# Patient Record
Sex: Male | Born: 1965 | Race: White | Hispanic: No | Marital: Married | Smoking: Current some day smoker
Health system: Southern US, Community
[De-identification: ages and names within clinical notes are randomized; demographics above are authoritative.]

## PROBLEM LIST (undated history)

## (undated) DIAGNOSIS — I10 Essential (primary) hypertension: Secondary | ICD-10-CM

## (undated) DIAGNOSIS — I5030 Unspecified diastolic (congestive) heart failure: Secondary | ICD-10-CM

## (undated) DIAGNOSIS — I429 Cardiomyopathy, unspecified: Secondary | ICD-10-CM

## (undated) DIAGNOSIS — N182 Chronic kidney disease, stage 2 (mild): Secondary | ICD-10-CM

## (undated) DIAGNOSIS — K529 Noninfective gastroenteritis and colitis, unspecified: Secondary | ICD-10-CM

## (undated) DIAGNOSIS — F32A Depression, unspecified: Secondary | ICD-10-CM

## (undated) DIAGNOSIS — I219 Acute myocardial infarction, unspecified: Secondary | ICD-10-CM

## (undated) DIAGNOSIS — F17200 Nicotine dependence, unspecified, uncomplicated: Secondary | ICD-10-CM

## (undated) DIAGNOSIS — I513 Intracardiac thrombosis, not elsewhere classified: Secondary | ICD-10-CM

## (undated) DIAGNOSIS — I639 Cerebral infarction, unspecified: Secondary | ICD-10-CM

## (undated) DIAGNOSIS — F329 Major depressive disorder, single episode, unspecified: Secondary | ICD-10-CM

## (undated) DIAGNOSIS — I2510 Atherosclerotic heart disease of native coronary artery without angina pectoris: Secondary | ICD-10-CM

## (undated) DIAGNOSIS — Z8673 Personal history of transient ischemic attack (TIA), and cerebral infarction without residual deficits: Secondary | ICD-10-CM

## (undated) DIAGNOSIS — K8020 Calculus of gallbladder without cholecystitis without obstruction: Secondary | ICD-10-CM

## (undated) DIAGNOSIS — E785 Hyperlipidemia, unspecified: Secondary | ICD-10-CM

## (undated) DIAGNOSIS — W19XXXA Unspecified fall, initial encounter: Secondary | ICD-10-CM

## (undated) DIAGNOSIS — J449 Chronic obstructive pulmonary disease, unspecified: Secondary | ICD-10-CM

## (undated) HISTORY — DX: Atherosclerotic heart disease of native coronary artery without angina pectoris: I25.10

## (undated) HISTORY — PX: CORONARY ARTERY BYPASS GRAFT: SHX141

## (undated) HISTORY — DX: Personal history of transient ischemic attack (TIA), and cerebral infarction without residual deficits: Z86.73

## (undated) HISTORY — DX: Intracardiac thrombosis, not elsewhere classified: I51.3

## (undated) HISTORY — DX: Unspecified diastolic (congestive) heart failure: I50.30

## (undated) HISTORY — DX: Essential (primary) hypertension: I10

## (undated) HISTORY — DX: Chronic kidney disease, stage 2 (mild): N18.2

## (undated) HISTORY — DX: Nicotine dependence, unspecified, uncomplicated: F17.200

## (undated) HISTORY — DX: Hyperlipidemia, unspecified: E78.5

## (undated) HISTORY — PX: EYE SURGERY: SHX253

---

## 2002-12-31 DIAGNOSIS — Z8673 Personal history of transient ischemic attack (TIA), and cerebral infarction without residual deficits: Secondary | ICD-10-CM

## 2002-12-31 HISTORY — DX: Personal history of transient ischemic attack (TIA), and cerebral infarction without residual deficits: Z86.73

## 2010-02-07 ENCOUNTER — Encounter (INDEPENDENT_AMBULATORY_CARE_PROVIDER_SITE_OTHER): Payer: Self-pay | Admitting: *Deleted

## 2010-02-07 LAB — CONVERTED CEMR LAB
ALT: 20 units/L
BUN: 21 mg/dL
Calcium: 9.8 mg/dL
Sodium: 137 meq/L

## 2010-02-15 ENCOUNTER — Emergency Department (HOSPITAL_COMMUNITY): Admission: EM | Admit: 2010-02-15 | Discharge: 2010-02-15 | Payer: Self-pay | Admitting: Emergency Medicine

## 2010-03-01 ENCOUNTER — Encounter (INDEPENDENT_AMBULATORY_CARE_PROVIDER_SITE_OTHER): Payer: Self-pay | Admitting: *Deleted

## 2010-03-01 DIAGNOSIS — I6350 Cerebral infarction due to unspecified occlusion or stenosis of unspecified cerebral artery: Secondary | ICD-10-CM

## 2010-03-02 ENCOUNTER — Ambulatory Visit: Payer: Self-pay | Admitting: Cardiology

## 2010-03-02 DIAGNOSIS — I10 Essential (primary) hypertension: Secondary | ICD-10-CM

## 2010-03-02 DIAGNOSIS — I2510 Atherosclerotic heart disease of native coronary artery without angina pectoris: Secondary | ICD-10-CM

## 2010-03-02 DIAGNOSIS — I25119 Atherosclerotic heart disease of native coronary artery with unspecified angina pectoris: Secondary | ICD-10-CM | POA: Insufficient documentation

## 2010-03-02 DIAGNOSIS — F17200 Nicotine dependence, unspecified, uncomplicated: Secondary | ICD-10-CM | POA: Insufficient documentation

## 2010-03-02 DIAGNOSIS — E782 Mixed hyperlipidemia: Secondary | ICD-10-CM

## 2010-03-02 DIAGNOSIS — Z720 Tobacco use: Secondary | ICD-10-CM | POA: Insufficient documentation

## 2010-03-06 ENCOUNTER — Encounter: Payer: Self-pay | Admitting: Cardiology

## 2010-04-27 ENCOUNTER — Emergency Department (HOSPITAL_COMMUNITY): Admission: EM | Admit: 2010-04-27 | Discharge: 2010-04-27 | Payer: Self-pay | Admitting: Emergency Medicine

## 2010-05-30 ENCOUNTER — Encounter: Payer: Self-pay | Admitting: Cardiology

## 2010-05-31 ENCOUNTER — Encounter (INDEPENDENT_AMBULATORY_CARE_PROVIDER_SITE_OTHER): Payer: Self-pay

## 2010-05-31 ENCOUNTER — Encounter: Payer: Self-pay | Admitting: Cardiology

## 2010-05-31 LAB — CONVERTED CEMR LAB
Alkaline Phosphatase: 87 units/L (ref 39–117)
Bilirubin, Direct: 0.1 mg/dL (ref 0.0–0.3)
Cholesterol: 174 mg/dL (ref 0–200)
Indirect Bilirubin: 0.2 mg/dL (ref 0.0–0.9)
Total Bilirubin: 0.3 mg/dL (ref 0.3–1.2)
Total CHOL/HDL Ratio: 4.5
Total Protein: 6.5 g/dL (ref 6.0–8.3)
Triglycerides: 190 mg/dL — ABNORMAL HIGH (ref <150)

## 2010-06-07 ENCOUNTER — Ambulatory Visit: Payer: Self-pay | Admitting: Cardiology

## 2010-06-07 DIAGNOSIS — I2589 Other forms of chronic ischemic heart disease: Secondary | ICD-10-CM

## 2010-06-08 ENCOUNTER — Ambulatory Visit (HOSPITAL_COMMUNITY): Admission: RE | Admit: 2010-06-08 | Discharge: 2010-06-08 | Payer: Self-pay | Admitting: Cardiology

## 2010-06-08 ENCOUNTER — Encounter: Payer: Self-pay | Admitting: Cardiology

## 2010-06-08 ENCOUNTER — Ambulatory Visit: Payer: Self-pay | Admitting: Cardiology

## 2010-06-09 ENCOUNTER — Encounter: Payer: Self-pay | Admitting: Cardiology

## 2010-06-25 LAB — CONVERTED CEMR LAB
BUN: 12 mg/dL (ref 6–23)
CO2: 25 meq/L (ref 19–32)
Calcium: 9.8 mg/dL (ref 8.4–10.5)
Chloride: 105 meq/L (ref 96–112)
Creatinine, Ser: 1.09 mg/dL (ref 0.40–1.50)
Glucose, Bld: 90 mg/dL (ref 70–99)
Potassium: 5 meq/L (ref 3.5–5.3)

## 2010-06-26 ENCOUNTER — Encounter: Payer: Self-pay | Admitting: Cardiology

## 2010-09-03 ENCOUNTER — Ambulatory Visit: Payer: Self-pay | Admitting: Cardiology

## 2010-09-03 ENCOUNTER — Encounter: Payer: Self-pay | Admitting: Cardiology

## 2010-09-03 ENCOUNTER — Encounter: Payer: Self-pay | Admitting: Emergency Medicine

## 2010-09-04 ENCOUNTER — Observation Stay (HOSPITAL_COMMUNITY): Admission: EM | Admit: 2010-09-04 | Discharge: 2010-09-04 | Payer: Self-pay | Admitting: Emergency Medicine

## 2010-09-05 ENCOUNTER — Encounter: Payer: Self-pay | Admitting: Cardiology

## 2010-09-05 LAB — CONVERTED CEMR LAB
AST: 17 units/L (ref 0–37)
Albumin: 4.2 g/dL (ref 3.5–5.2)
Bilirubin, Direct: 0.1 mg/dL (ref 0.0–0.3)
Indirect Bilirubin: 0.3 mg/dL (ref 0.0–0.9)
LDL Cholesterol: 94 mg/dL (ref 0–99)
Triglycerides: 120 mg/dL (ref <150)

## 2010-09-06 ENCOUNTER — Ambulatory Visit: Payer: Self-pay | Admitting: Cardiology

## 2010-09-06 ENCOUNTER — Encounter (HOSPITAL_COMMUNITY): Admission: RE | Admit: 2010-09-06 | Discharge: 2010-09-06 | Payer: Self-pay | Admitting: Cardiology

## 2010-09-07 ENCOUNTER — Encounter (INDEPENDENT_AMBULATORY_CARE_PROVIDER_SITE_OTHER): Payer: Self-pay | Admitting: *Deleted

## 2010-09-08 ENCOUNTER — Ambulatory Visit: Payer: Self-pay | Admitting: Cardiology

## 2010-09-08 DIAGNOSIS — I513 Intracardiac thrombosis, not elsewhere classified: Secondary | ICD-10-CM | POA: Insufficient documentation

## 2010-09-12 ENCOUNTER — Encounter: Payer: Self-pay | Admitting: Cardiology

## 2010-09-13 ENCOUNTER — Ambulatory Visit: Payer: Self-pay | Admitting: Cardiology

## 2010-09-18 ENCOUNTER — Ambulatory Visit: Payer: Self-pay | Admitting: Cardiology

## 2010-09-18 LAB — CONVERTED CEMR LAB: POC INR: 1.8

## 2010-09-25 ENCOUNTER — Ambulatory Visit: Payer: Self-pay | Admitting: Cardiology

## 2010-09-25 LAB — CONVERTED CEMR LAB: POC INR: 2.4

## 2010-10-02 ENCOUNTER — Encounter: Payer: Self-pay | Admitting: Adult Health

## 2010-10-02 ENCOUNTER — Ambulatory Visit: Payer: Self-pay | Admitting: Cardiology

## 2010-10-02 DIAGNOSIS — R9439 Abnormal result of other cardiovascular function study: Secondary | ICD-10-CM | POA: Insufficient documentation

## 2010-10-09 ENCOUNTER — Ambulatory Visit: Payer: Self-pay | Admitting: Cardiology

## 2010-10-09 LAB — CONVERTED CEMR LAB: POC INR: 1.8

## 2010-10-19 ENCOUNTER — Ambulatory Visit: Payer: Self-pay | Admitting: Cardiology

## 2010-11-02 ENCOUNTER — Ambulatory Visit: Payer: Self-pay | Admitting: Cardiology

## 2010-11-14 ENCOUNTER — Telehealth (INDEPENDENT_AMBULATORY_CARE_PROVIDER_SITE_OTHER): Payer: Self-pay | Admitting: *Deleted

## 2010-11-16 ENCOUNTER — Ambulatory Visit: Payer: Self-pay

## 2010-11-16 LAB — CONVERTED CEMR LAB: POC INR: 2.2

## 2010-11-30 ENCOUNTER — Telehealth (INDEPENDENT_AMBULATORY_CARE_PROVIDER_SITE_OTHER): Payer: Self-pay | Admitting: *Deleted

## 2010-12-07 ENCOUNTER — Ambulatory Visit: Payer: Self-pay | Admitting: Cardiology

## 2010-12-07 LAB — CONVERTED CEMR LAB: POC INR: 2.8

## 2011-01-04 ENCOUNTER — Ambulatory Visit: Admission: RE | Admit: 2011-01-04 | Discharge: 2011-01-04 | Payer: Self-pay | Source: Home / Self Care

## 2011-01-30 NOTE — Miscellaneous (Signed)
Summary: ct of head and chest xray 09/03/2010  Clinical Lists Changes  Observations: Added new observation of CXR RESULTS:   Clinical Data: Chest pain.    PORTABLE CHEST - 1 VIEW    Comparison: None    Findings: The cardiac silhouette, mediastinal and hilar contours   are within normal limits.  There are surgical changes from bypass   surgery.  There are chronic-appearing bronchitic type lung changes   but no acute pulmonary findings.    IMPRESSION:   No acute cardiopulmonary findings.    Read By:  Cyndie Chime,  M.D.   Released By:  Cyndie Chime,  M.D.  (09/03/2010 17:04) Added new observation of CT SCAN:  Clinical Data: Left arm tingling.    CT HEAD WITHOUT CONTRAST    Technique:  Contiguous axial images were obtained from the base of   the skull through the vertex without contrast.    Comparison: Head CT 04/27/2010    Findings: There is a remote right temporal parietal infarct.  No   new/acute intracranial findings such as hemispheric infarction or   intracranial hemorrhage.  The ventricles are normal and stable.  No   extra-axial fluid collections are seen.  The brainstem and   cerebellum appear normal and stable.    The bony calvarium is intact.  The visualized paranasal sinuses and   mastoid air cells are clear.    IMPRESSION:    1.  Remote right temporal parietal infarct.   2.  No acute intracranial findings.    Read By:  Cyndie Chime,  M.D. (09/03/2010 17:04)      CT Scan  Procedure date:  09/03/2010  Findings:       Clinical Data: Left arm tingling.    CT HEAD WITHOUT CONTRAST    Technique:  Contiguous axial images were obtained from the base of   the skull through the vertex without contrast.    Comparison: Head CT 04/27/2010    Findings: There is a remote right temporal parietal infarct.  No   new/acute intracranial findings such as hemispheric infarction or   intracranial hemorrhage.  The ventricles are normal and stable.   No   extra-axial fluid collections are seen.  The brainstem and   cerebellum appear normal and stable.    The bony calvarium is intact.  The visualized paranasal sinuses and   mastoid air cells are clear.    IMPRESSION:    1.  Remote right temporal parietal infarct.   2.  No acute intracranial findings.    Read By:  Cyndie Chime,  M.D.  CXR  Procedure date:  09/03/2010  Findings:        Clinical Data: Chest pain.    PORTABLE CHEST - 1 VIEW    Comparison: None    Findings: The cardiac silhouette, mediastinal and hilar contours   are within normal limits.  There are surgical changes from bypass   surgery.  There are chronic-appearing bronchitic type lung changes   but no acute pulmonary findings.    IMPRESSION:   No acute cardiopulmonary findings.    Read By:  Cyndie Chime,  M.D.   Released By:  Cyndie Chime,  M.D.

## 2011-01-30 NOTE — Medication Information (Signed)
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 no    Recent/future dental: no  Any missed doses?: no       Is patient compliant with meds? yes       Allergies: 1)  ! * "citrus Foods"  Anticoagulation Management History:      The patient is taking warfarin and comes in today for a routine follow up visit.  Warfarin therapy is being given due to LV thrombus   /  CVA.  Positive risk factors for bleeding include history of CVA/TIA.  Negative risk factors for bleeding include an age less than 5 years old, no history of GI bleeding, and absence of serious comorbidities.  The bleeding index is 'intermediate risk'.  Positive CHADS2 values include History of HTN and Prior Stroke/CVA/TIA.  Negative CHADS2 values include History of CHF, Age > 24 years old, and History of Diabetes.  The start date was 09/13/2010.  Anticoagulation responsible provider: Daleen Squibb MD, Maisie Fus.  INR POC: 2.8.  Cuvette Lot#: [id number].    Anticoagulation Management Assessment/Plan:      The patient's current anticoagulation dose is Warfarin sodium 5 mg tabs: Take 1 tablet daily or as directed by coumadin clinic.  The target INR is 2.0-3.0.  The next INR is due 01/04/2011.  Anticoagulation instructions were given to patient.  Results were reviewed/authorized by Steve Hey, RN.  He was notified by Steve Hey RN.         Prior Anticoagulation Instructions: INR 2.2 Continue coumadin 5mg  once daily except 7.5mg  on Mondays and Fridays  Current Anticoagulation Instructions: INR 2.8 Continue coumadin 5mg  once daily except 7.5mg  on Mondays and  Fridays

## 2011-01-30 NOTE — Medication Information (Signed)
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 yes       Details: Starting on coumadin today for LV thrombus and old CVA  Recent/future dental: no  Any missed doses?: no       Is patient compliant with meds? yes      Comments: Pt has been on coumadin in the past.  Coumadin teaching done with pt/wife.  Questions answered.  Literature provided.  Allergies: 1)  ! * "citrus Foods"  Anticoagulation Management History:      The patient comes in today for his initial visit for anticoagulation therapy.  He is being anticoagulated because of LV thrombus   /  CVA.  Positive risk factors for bleeding include history of CVA/TIA.  Negative risk factors for bleeding include an age less than 39 years old, no history of GI bleeding, and absence of serious comorbidities.  The bleeding index is 'intermediate risk'.  Positive CHADS2 values include History of HTN and Prior Stroke/CVA/TIA.  Negative CHADS2 values include History of CHF, Age > 26 years old, and History of Diabetes.  The start date is 09/13/2010.  Anticoagulation responsible Maddyn Lieurance: Diona Browner MD, Remi Deter.  INR POC: 1.1.    Anticoagulation Management Assessment/Plan:      The patient's current anticoagulation dose is Warfarin sodium 5 mg tabs: Take 1 tablet daily or as directed by coumadin clinic.  The target INR is 2.0-3.0.  The next INR is due 09/18/2010.  Anticoagulation instructions were given to patient.  Results were reviewed/authorized by Vashti Hey, RN.  He was notified by Vashti Hey RN.         Current  Anticoagulation Instructions: INR 1.1 Will start coumadin 5mg  once daily and recheck INR 08/31/10 Prescriptions: WARFARIN SODIUM 5 MG TABS (WARFARIN SODIUM) Take 1 tablet daily or as directed by coumadin clinic  #30 x 3   Entered by:   Vashti Hey RN   Authorized by:   Loreli Slot, MD, [facility]   Signed by:   Vashti Hey RN on 09/13/2010   Method used:   Electronically to        [facility]  [address] (retail)       [address]       [place], [place]  [address]       Ph: [phone]       Fax: [phone]   RxID:   [id number]

## 2011-01-30 NOTE — Progress Notes (Signed)
Summary: RX REFILL PT IS OUT PLEASE CALL IN TODAY  Phone Note Call from Patient Call back at Home Phone [phone]   Caller: PT Reason for Call: Refill Medication Summary of Call: PER PT TOOK HIS RX FOR METOPROLO TO [facility] IN [place] AND THEY DENIDED IT COULD WE PLEASE CALL IT IN FOR HIM HE IS OUT. Initial call taken by: Faythe Ghee,  November 30, 2010 2:59 PM    Prescriptions: METOPROLOL TARTRATE 25 MG TABS (METOPROLOL TARTRATE) take 1 tab daily  #30 x 3   Entered by:   Teressa Lower RN   Authorized by:   Joni Reining, NP   Signed by:   Teressa Lower RN on 11/30/2010   Method used:   Electronically to        [facility]  [address] (retail)       [address]       [place], [place]  [address]       Ph: [phone]       Fax: [phone]   RxID:   [id number]

## 2011-01-30 NOTE — Miscellaneous (Signed)
Summary: cmp,lipids,02/07/2010  Clinical Lists Changes  Observations: Added new observation of CALCIUM: 9.8 mg/dL (75/64/3329 5:18) Added new observation of ALBUMIN: 4.5 g/dL (84/16/6063 0:16) Added new observation of PROTEIN, TOT: 7.5 g/dL (01/08/3234 5:73) Added new observation of SGPT (ALT): 20 units/L (02/07/2010 8:33) Added new observation of SGOT (AST): 19 units/L (02/07/2010 8:33) Added new observation of ALK PHOS: 97 units/L (02/07/2010 8:33) Added new observation of CREATININE: 1.04 mg/dL (22/01/5426 0:62) Added new observation of BUN: 21 mg/dL (37/62/8315 1:76) Added new observation of BG RANDOM: 106 mg/dL (16/06/3709 6:26) Added new observation of CO2 PLSM/SER: 24 meq/L (02/07/2010 8:33) Added new observation of CL SERUM: 99 meq/L (02/07/2010 8:33) Added new observation of K SERUM: 4.8 meq/L (02/07/2010 8:33) Added new observation of NA: 137 meq/L (02/07/2010 8:33) Added new observation of LDL: 148 mg/dL (94/85/4627 0:35) Added new observation of HDL: 45 mg/dL (00/93/8182 9:93) Added new observation of TRIGLYC TOT: 322 mg/dL (71/69/6789 3:81) Added new observation of CHOLESTEROL: 257 mg/dL (01/75/1025 8:52)

## 2011-01-30 NOTE — Letter (Signed)
Summary: ECHO  ECHO   Imported By: Faythe Ghee 03/06/2010 13:11:26  _____________________________________________________________________  External Attachment:    Type:   Image     Comment:   External Document

## 2011-01-30 NOTE — Consult Note (Signed)
Summary: [facility]   [facility]   Imported By: Roderic Ovens 10/25/2010 15:57:55  _____________________________________________________________________  External Attachment:    Type:   Image     Comment:   External Document

## 2011-01-30 NOTE — Letter (Signed)
Summary: PROGRESS NOTES  PROGRESS NOTES   Imported By: Faythe Ghee 03/06/2010 13:11:05  _____________________________________________________________________  External Attachment:    Type:   Image     Comment:   External Document

## 2011-01-30 NOTE — Miscellaneous (Signed)
**Note De-Identified Shazia Mitchener Obfuscation** Summary: medications update  Clinical Lists Changes  Medications: Changed medication from PRAVASTATIN SODIUM 40 MG TABS (PRAVASTATIN SODIUM) take 1 tab daily to PRAVACHOL 80 MG TABS (PRAVASTATIN SODIUM) take 1 tablet by mouth at bedtime - Signed Rx of PRAVACHOL 80 MG TABS (PRAVASTATIN SODIUM) take 1 tablet by mouth at bedtime;  #30 x 3;  Signed;  Entered by: Larita Fife Chrstopher Malenfant LPN;  Authorized by: Loreli Slot, MD, [facility];  Method used: Electronically to [facility] [address], [place], [place], [place]  [address], Ph: [phone], Fax: [phone] Orders: Added new Test order of T-Lipid Profile [phone]) - Signed Added new Test order of T-Hepatic Function [phone]) - Signed    Prescriptions: PRAVACHOL 80 MG TABS (PRAVASTATIN SODIUM) take 1 tablet by mouth at bedtime  #30 x 3   Entered by:   Larita Fife Ralyn Stlaurent LPN   Authorized by:   Loreli Slot, MD, [facility]   Signed by:   Larita Fife Toniann Dickerson LPN on 32/95/1884   Method used:   Electronically to        [facility]  [address] (retail)       [address]       [place], [place]  [address]       Ph: [phone]       Fax: [phone]   RxID:   [id number]

## 2011-01-30 NOTE — Letter (Signed)
Summary: [place] Results Engineer, agricultural at [facility]  [address], [place] [address]   Phone: [phone]  Fax: [phone]      September 12, 2010 MRN: [id number]   [facility] [address] [place], [place]  [address]   Dear Steve Vang,  Your test ordered by Selena Batten has been reviewed by your physician (or physician assistant) and was found to be normal or stable. Your physician (or physician assistant) felt no changes were needed at this time.  ____ Echocardiogram  __X__ Cardiac Stress Test  ____ Lab Work  ____ Peripheral vascular study of arms, legs or neck  ____ CT scan or X-ray  ____ Lung or Breathing test  ____ Other: Please continue on current medical treatment.  Thank you.   [name], MD, F.A.C.C

## 2011-01-30 NOTE — Letter (Signed)
Summary: [place] Results Engineer, agricultural at [facility]  [address], [place] [address]   Phone: [phone]  Fax: [phone]      September 05, 2010 MRN: [id number]   [facility] [address] [place], [place]  [address]   Dear Mr. Dass,  Your test ordered by Selena Batten has been reviewed by your physician (or physician assistant) and was found to be normal or stable. Your physician (or physician assistant) felt no changes were needed at this time.  ____ Echocardiogram  ____ Cardiac Stress Test  __X__ Lab Work  ____ Peripheral vascular study of arms, legs or neck  ____ CT scan or X-ray  ____ Lung or Breathing test  ____ Other: Please continue on current medical treatment.  Thank you.   [name], MD, F.A.C.C

## 2011-01-30 NOTE — Assessment & Plan Note (Signed)
Summary: F1M NEEDS STRESS TEST RESULTS   Visit Type:  Follow-up Primary Provider:  [facility] Department   History of Present Illness: Steve Vang is a 45 y/o CM we are seeing today for discussion of test results which include a stress myoview.  He was seen last by Dr. Diona Browner on 09/08/2010 and records from [facility] cardiology were reviewed by him.  Steve Vang has a history of CVA, hypercholesterolemia, and LV mural thrombus for which he is currently on coumadin and followed in our clinic in [place], and multivessel CAD.  He is without complaints of chest pain, is taking his medications as directed.  He states is trying to quit smoking which is difficult for him as 3 others he lives with still smoke as well. He is also complaining of situational depression and has appointment with primary care physician tomorrow for evaluation and treatment.  Current Medications (verified): 1)  Lisinopril 20 Mg Tabs (Lisinopril) .... Take 1 Tablet By Mouth Once Daily 2)  Pravachol 80 Mg Tabs (Pravastatin Sodium) .... Take 1 Tablet By Mouth At Bedtime 3)  Metoprolol Tartrate 25 Mg Tabs (Metoprolol Tartrate) .... Take 1 Tab Daily 4)  Aspir-Low 81 Mg Tbec (Aspirin) .... Take 1 Tab Daily 5)  Nitrostat 0.4 Mg Subl (Nitroglycerin) .Marland Kitchen.. 1 Tablet Under Tongue At Onset of Chest Pain; You May Repeat Every 5 Minutes For Up To 3 Doses. 6)  Warfarin Sodium 5 Mg Tabs (Warfarin Sodium) .... Take 1 Tablet Daily or As Directed By Coumadin Clinic  Allergies: 1)  ! * "citrus Foods"  Comments:  Nurse/Medical Assistant: patient brought med bottles als went over last ov   Past History:  Past medical, surgical, family and social histories (including risk factors) reviewed, and no changes noted (except as noted below).  Past Medical History: Reviewed history from 09/08/2010 and no changes required. 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 Father: unknown Mother: hypertension, diabetes mellitus, cardiovascular disease  Social History: Reviewed history from 03/02/2010 and no changes required. Tobacco Use - Yes. 1 1/2 packs daily Alcohol Use - no Regular Exercise - no Drug Use - former, marijuana for several years  Review of Systems       Depression and difficulty quitting smoking.  All other systems have been reviewed and are negative unless stated above.   Vital Signs:  Patient profile:   45 year old male Weight:      146 pounds BMI:     22.95 Pulse rate:   60 / minute BP sitting:   105 / 72  (right arm)  Vitals Entered By: Dreama Saa, CNA (October 02, 2010 2:22 PM)  Physical Exam  General:  Well developed, well nourished, in no acute distress. Lungs:  Clear bilaterally to auscultation and percussion. Heart:  Non-displaced PMI, chest non-tender; regular rate and rhythm, S1, S2 without murmurs, rubs or gallops. Carotid upstroke normal, no bruit. Normal abdominal aortic size, no bruits. Femorals normal pulses, no bruits. Pedals normal pulses. No edema, no varicosities. Abdomen:  Bowel sounds positive; abdomen soft and non-tender without masses, organomegaly, or hernias noted. No hepatosplenomegaly. Extremities:  No clubbing or  cyanosis. Neurologic:  Alert and oriented x 3. Psych:  Normal affect.   Impression & Recommendations:  Problem # 1:  ABNORMAL CV (STRESS) TEST (ICD-794.39) Review of stress test demonstrates somewhat impaired exercise capacity for age with significant ST-segment changes during exercise in the  absence of angina, mild left ventricular  dilatation and dysfunction in a segmental pattern and scintrigraphic evidence for infarction in the distal distribution of the LAd most likely or perhaps PDA.  Minimal ascities peri-infarction ischemia.  EF 44%.  Spoke with Dr. Diona Browner by phone as he was in the [place] office. He referred me to his addendum to the stress test results stating that the patient would be treated medically for now unless he is symptomatic.  Mr. Whitter is asymptomatic at this time.  He is compliant with his medications.  We will follow-up with him in 3 months.  He states that he is trying to get disabililty for his heart condition and I have reasurred him that we would provide our documentation of stress test if needed.  Problem # 2:  MURAL THROMBUS, LEFT VENTRICLE (ICD-429.79) He continues on comadin without complaints of bleeding or bruising.  He will continue im our coumadin clinic next week as scheduled.  Problem # 3:  TOBACCO ABUSE (ICD-305.1) He is cutting back on smoking from 3 packs a day to 1/2 pack a day.  He has stopped smoking marijuana as well. I have encouraged him to continue his efforts.  Patient Instructions: 1)  Your physician recommends that you schedule a follow-up appointment in: 3 months 2)  Your physician recommends that you continue on your current medications as directed. Please refer to the Current Medication list given to you today.

## 2011-01-30 NOTE — Medication Information (Signed)
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 no    Recent/future dental: no  Any missed doses?: no       Is patient compliant with meds? yes       Allergies: 1)  ! * "citrus Foods"  Anticoagulation Management History:      The patient is taking warfarin and comes in today for a routine follow up visit.  He is being anticoagulated because of LV thrombus   /  CVA.  Positive risk factors for bleeding include history of CVA/TIA.  Negative risk factors for bleeding include an age less than 46 years old, no history of GI bleeding, and absence of serious comorbidities.  The bleeding index is 'intermediate risk'.  Positive CHADS2 values include History of HTN and Prior Stroke/CVA/TIA.  Negative CHADS2 values include History of CHF, Age > 11 years old, and History of Diabetes.  The start date was 09/13/2010.  Anticoagulation responsible provider: Dietrich Pates MD, Molly Maduro.  INR POC: 2.4.  Cuvette Lot#: [id number].    Anticoagulation Management Assessment/Plan:      The patient's current anticoagulation dose is Warfarin sodium 5 mg tabs: Take 1 tablet daily or as directed by coumadin clinic.  The target INR is 2.0-3.0.  The next INR is due 10/09/2010.  Anticoagulation instructions were given to patient.  Results were reviewed/authorized by Vashti Hey, RN.  He was notified by Vashti Hey RN.         Prior Anticoagulation Instructions: INR 1.8 Continue coumadin 5mg  once daily   Current Anticoagulation Instructions: INR 2.4 Continue coumadin 5mg  once daily

## 2011-01-30 NOTE — Progress Notes (Signed)
Summary: RX REFILL  Phone Note Call from Patient Call back at Home Phone [phone] Call back at [phone]   Caller: PT Reason for Call: Refill Medication, Talk to Nurse Summary of Call: PT NEEDS LISINOPRIL AND PRAVACHOL CALLED IN TO [facility] [place] [facility]. Initial call taken by: Faythe Ghee,  November 14, 2010 2:23 PM    Prescriptions: PRAVACHOL 80 MG TABS (PRAVASTATIN SODIUM) take 1 tablet by mouth at bedtime  #30 x 3   Entered by:   Teressa Lower RN   Authorized by:   Joni Reining, NP   Signed by:   Teressa Lower RN on 11/14/2010   Method used:   Electronically to        [facility]  [address] (retail)       [address]       [place], [place]  [address]       Ph: [phone]       Fax: [phone]   RxID:   [id number] LISINOPRIL 20 MG TABS (LISINOPRIL) take 1 tablet by mouth once daily  #30 x 3   Entered by:   Teressa Lower RN   Authorized by:   Joni Reining, NP   Signed by:   Teressa Lower RN on 11/14/2010   Method used:   Electronically to        [facility]  [address] (retail)       [address]       [place], [place]  [address]       Ph: [phone]       Fax: [phone]   RxID:   [id number]

## 2011-01-30 NOTE — Letter (Signed)
Summary: [place] [employer] Lab Work Engineer, agricultural at [facility] [employer]  [address], [place] [address]   Phone: [phone]  Fax: [phone]     March 02, 2010 MRN: [id number]   Steve Vang [address] [place], [place]  [address]      YOUR LAB WORK IS DUE  May 29, 2010 _________________________________________  Please go to [facility], located across the street from [facility] on the second floor.  Hours are Monday - Friday 7am until 7:30pm         Saturday 8am until 12noon    _X_  DO NOT EAT OR DRINK AFTER MIDNIGHT EVENING PRIOR TO LABWORK  __ YOUR LABWORK IS NOT FASTING --YOU MAY EAT PRIOR TO LABWORK

## 2011-01-30 NOTE — Letter (Signed)
Summary: PROGRESS NOTE  PROGRESS NOTE   Imported By: Faythe Ghee 03/06/2010 13:12:15  _____________________________________________________________________  External Attachment:    Type:   Image     Comment:   External Document

## 2011-01-30 NOTE — Medication Information (Signed)
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 no    Recent/future dental: no  Any missed doses?: yes     Details: missed 1 dose last night  Is patient compliant with meds? yes       Allergies: 1)  ! * "citrus Foods"  Anticoagulation Management History:      The patient is taking warfarin and comes in today for a routine follow up visit.  He is being anticoagulated because of LV thrombus   /  CVA.  Positive risk factors for bleeding include history of CVA/TIA.  Negative risk factors for bleeding include an age less than 46 years old, no history of GI bleeding, and absence of serious comorbidities.  The bleeding index is 'intermediate risk'.  Positive CHADS2 values include History of HTN and Prior Stroke/CVA/TIA.  Negative CHADS2 values include History of CHF, Age > 46 years old, and History of Diabetes.  The start date was 09/13/2010.  Anticoagulation responsible provider: Dietrich Pates MD, Molly Maduro.  INR POC: 2.3.    Anticoagulation Management Assessment/Plan:      The patient's current anticoagulation dose is Warfarin sodium 5 mg tabs: Take 1 tablet daily or as directed by coumadin clinic.  The target INR is 2.0-3.0.  The next INR is due 11/16/2010.  Anticoagulation instructions were given to patient.  Results were reviewed/authorized by Vashti Hey, RN.  He was notified by Vashti Hey RN.         Prior Anticoagulation Instructions: INR 1.6 Missed 1 dose of couamdin Take coumadin 2 tablets tonight and tomorrow night then resume 5mg  once daily except 7.5mg  on Mondays and Fridays  Current Anticoagulation  Instructions: INR 2.3 Continue coumadin 5mg  once daily except 7.5mg  on Mondays and Fridays

## 2011-01-30 NOTE — Medication Information (Signed)
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 no    Recent/future dental: no  Any missed doses?: no       Is patient compliant with meds? yes       Allergies: 1)  ! * "citrus Foods"  Anticoagulation Management History:      The patient is taking warfarin and comes in today for a routine follow up visit.  He is being anticoagulated because of LV thrombus   /  CVA.  Positive risk factors for bleeding include history of CVA/TIA.  Negative risk factors for bleeding include an age less than 72 years old, no history of GI bleeding, and absence of serious comorbidities.  The bleeding index is 'intermediate risk'.  Positive CHADS2 values include History of HTN and Prior Stroke/CVA/TIA.  Negative CHADS2 values include History of CHF, Age > 67 years old, and History of Diabetes.  The start date was 09/13/2010.  Anticoagulation responsible provider: Dietrich Pates MD, Molly Maduro.  INR POC: 1.8.  Cuvette Lot#: [id number].    Anticoagulation Management Assessment/Plan:      The patient's current anticoagulation dose is Warfarin sodium 5 mg tabs: Take 1 tablet daily or as directed by coumadin clinic.  The target INR is 2.0-3.0.  The next INR is due 09/25/2010.  Anticoagulation instructions were given to patient.  Results were reviewed/authorized by Vashti Hey, RN.  He was notified by Vashti Hey RN.         Prior Anticoagulation Instructions: INR 1.1 Will start coumadin 5mg  once daily and recheck INR 08/31/10  Current Anticoagulation Instructions: INR 1.8 Continue coumadin 5mg  once daily

## 2011-01-30 NOTE — Letter (Signed)
Summary: [place] [facility] Work Engineer, agricultural at [employer]  [address], [place] [address]   Phone: [phone]  Fax: [phone]     May 31, 2010 MRN: [id number]   Steve Vang [address] [place], [place]  [address]      YOUR LAB WORK IS DUE  August 28, 2010 _________________________________________  Please go to [facility], located across the street from [facility] on the second floor.  Hours are Monday - Friday 7am until 7:30pm         Saturday 8am until 12noon    _X_  DO NOT EAT OR DRINK AFTER MIDNIGHT EVENING PRIOR TO LABWORK  __ YOUR LABWORK IS NOT FASTING --YOU MAY EAT PRIOR TO LABWORK

## 2011-01-30 NOTE — Medication Information (Signed)
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 no    Recent/future dental: no  Any missed doses?: yes     Details: Ran out of med   Missed 1-2 days  Is patient compliant with meds? yes       Allergies: 1)  ! * "citrus Foods"  Anticoagulation Management History:      The patient is taking warfarin and comes in today for a routine follow up visit.  He is being anticoagulated because of LV thrombus   /  CVA.  Positive risk factors for bleeding include history of CVA/TIA.  Negative risk factors for bleeding include an age less than 71 years old, no history of GI bleeding, and absence of serious comorbidities.  The bleeding index is 'intermediate risk'.  Positive CHADS2 values include History of HTN and Prior Stroke/CVA/TIA.  Negative CHADS2 values include History of CHF, Age > 76 years old, and History of Diabetes.  The start date was 09/13/2010.  Anticoagulation responsible provider: Dietrich Pates MD, Molly Maduro.  INR POC: 1.6.  Cuvette Lot#: [id number].    Anticoagulation Management Assessment/Plan:      The patient's current anticoagulation dose is Warfarin sodium 5 mg tabs: Take 1 tablet daily or as directed by coumadin clinic.  The target INR is 2.0-3.0.  The next INR is due 11/02/2010.  Anticoagulation instructions were given to patient.  Results were reviewed/authorized by Vashti Hey, RN.  He was notified by Vashti Hey RN.         Prior Anticoagulation Instructions: INR 1.8 Increase coumadin to 5mg  once daily except 7.5mg  on Mondays and Thursdays  Current Anticoagulation Instructions: INR 1.6 Missed 1  dose of couamdin Take coumadin 2 tablets tonight and tomorrow night then resume 5mg  once daily except 7.5mg  on Mondays and Fridays

## 2011-01-30 NOTE — Letter (Signed)
Summary: [place] Results Engineer, agricultural at [facility]  [address], [place] [address]   Phone: [phone]  Fax: [phone]      June 09, 2010 MRN: [id number]   [facility] [address] [place], [place]  [address]   Dear Mr. Sarkis,  Your test ordered by Selena Batten has been reviewed by your physician (or physician assistant) and was found to be normal or stable. Your physician (or physician assistant) felt no changes were needed at this time.  __X__ Echocardiogram  ____ Cardiac Stress Test  ____ Lab Work  ____ Peripheral vascular study of arms, legs or neck  ____ CT scan or X-ray  ____ Lung or Breathing test  ____ Other: Please continue on current medical treatment.  Thank you.   [name], MD, F.A.C.C

## 2011-01-30 NOTE — Medication Information (Signed)
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 no    Recent/future dental: no  Any missed doses?: no       Is patient compliant with meds? yes       Allergies: 1)  ! * "citrus Foods"  Anticoagulation Management History:      The patient is taking warfarin and comes in today for a routine follow up visit.  Warfarin therapy is being given due to LV thrombus   /  CVA.  Positive risk factors for bleeding include history of CVA/TIA.  Negative risk factors for bleeding include an age less than 63 years old, no history of GI bleeding, and absence of serious comorbidities.  The bleeding index is 'intermediate risk'.  Positive CHADS2 values include History of HTN and Prior Stroke/CVA/TIA.  Negative CHADS2 values include History of CHF, Age > 2 years old, and History of Diabetes.  The start date was 09/13/2010.  Anticoagulation responsible Taeya Theall: Steve Vang, Steve Vang.  INR POC: 2.2.  Cuvette Lot#: [id number].    Anticoagulation Management Assessment/Plan:      The patient's current anticoagulation dose is Warfarin sodium 5 mg tabs: Take 1 tablet daily or as directed by coumadin clinic.  The target INR is 2.0-3.0.  The next INR is due 12/07/2010.  Anticoagulation instructions were given to patient.  Results were reviewed/authorized by Steve Hey, RN.  He was notified by Steve Hey RN.         Prior Anticoagulation Instructions: INR 2.3 Continue coumadin 5mg  once daily except 7.5mg  on Mondays and Fridays  Current Anticoagulation Instructions: INR 2.2 Continue coumadin 5mg  once daily except 7.5mg  on Mondays  and Fridays

## 2011-01-30 NOTE — Assessment & Plan Note (Signed)
Summary: 3 mth f/u per checkout on 03/02/10/tg   Visit Type:  Follow-up Primary Provider:  [facility] Department   History of Present Illness: 45 year old male presented for followup. I met him recently to establish cardiology followup.  I received records from [facility] regarding the patient's prior cardiac history. 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 I note that he was found to have severe proximal left anterior descending disease at catheterization in 1992, with subsequent findings of progressive multivessel disease, although the details of his anatomy are not available from the time of surgery. He underwent bypass grafting in August of 2006 at [facility] including a LIMA to the first diagonal, SVG to posterior lateral branch of the circumflex, and SVG to an RV branch of a nondominant right coronary artery. He underwent DOR anterior ventricular restoration surgery at the same time. The operative report indicates that approximately half of the ventricular chamber cavity was reduced. 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 No other significant valvular disease as described. More recently the patient was seen in cardiology consultation at [facility] in the setting of atypical chest pain. He was evaluated by a Dr. Nelda Severe. His note indicates that the patient had not kept regular scheduled followup visits or taken his medications.  He states that he underwent a physical examination by a disability determination physician in [place] recently, pending results. He describes a occasional sense of  unsteadiness, lasting only a few seconds, without any obvious palpitations. He has occasional chest pain.  Followup labs from 31 May revealed cholesterol 174, triglycerides 190, HDL 39, LDL 97, AST 21, ALT 25.  Current Medications (verified): 1)  Lisinopril 20 Mg Tabs (Lisinopril) .... Take 1 Tablet By Mouth Once Daily 2)  Pravachol 80 Mg Tabs (Pravastatin Sodium) .... Take 1 Tablet By Mouth At Bedtime 3)  Metoprolol Tartrate 25 Mg Tabs (Metoprolol Tartrate) .... Take 1 Tab Daily 4)  Aspir-Low 81 Mg Tbec (Aspirin) .... Take 1 Tab Daily 5)  Nitrostat 0.4 Mg Subl (Nitroglycerin) .Marland Kitchen.. 1 Tablet Under Tongue At Onset of Chest Pain; You May Repeat Every 5 Minutes For Up To 3 Doses.  Allergies (verified): 1)  ! * "citrus Foods"  Past History:  Past Medical History: Last updated: 03/02/2010 CAD - presumably multivessel, LVEF unknown Cerebrovascular Disease - stroke 2004 Hyperlipidemia Hypertension  Past Surgical History: Last updated: 03/02/2010 CABG and possible aneurysmectomy 2006, details not yet available Eye surgery as a child  Social History: Last updated: 03/02/2010 Tobacco Use - Yes. 1 1/2 packs daily Alcohol Use - no Regular Exercise - no Drug Use - former, marijuana for several years  Review of Systems       The patient complains of dyspnea on exertion.  The patient denies anorexia, fever, syncope, peripheral edema, prolonged cough, headaches, hemoptysis, melena, and hematochezia.         Dyspnea on exertion is mild, NYHA class 2. Otherwise reviewed and negative except as outlined.   Vital Signs:  Patient profile:   45 year old male Weight:      149 pounds Pulse rate:   59 / minute BP sitting:   141 / 83  (right arm)  Vitals Entered By: Dreama Saa, CNA (June 07, 2010  11:18 AM)  Physical Exam  Additional Exam:  Thin male in no acute distress. HEENT: Conjunctiva and lids normal, oropharynx with poor dentition. Neck: Supple, no carotid bruits or thyromegaly.  Normal jugular venous pressure. Lungs: Clear to auscultation, nonlabored. Thorax: Well-healed midline sternal incision and upper abdominal keyhole incisions. Cardiac: Regular rate and rhythm, no ventricular heave, no S3 gallop, soft systolic murmur. Abdomen: Soft, nontender, no hepatomegaly, bowel sounds present. No bruits. Skin: Warm and dry, scattered tattoos. Musculoskeletal: No gross deformities. Extremities: No pitting edema. Neuropsychiatric: Alert and oriented x3, affect grossly appropriate.   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 He is status post coronary artery bypass grafting as reviewed above, and underwent a DOR anterior ventricular restoration surgery in 2006. 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 His followup since then has been less consistent. I plan to proceed with a followup 2-D echocardiogram at this time. Prescription for sublingual nitroglycerin was also given. I recommend that he continue his present medications otherwise pending further review.  His updated medication list for this problem includes:    Lisinopril 20 Mg Tabs (Lisinopril) .Marland Kitchen... Take 1 tablet by mouth once daily    Metoprolol Tartrate 25 Mg Tabs (Metoprolol tartrate) .Marland Kitchen... Take 1 tab daily    Aspir-low 81 Mg Tbec (Aspirin) .Marland Kitchen... Take 1 tab daily    Nitrostat 0.4 Mg Subl (Nitroglycerin) .Marland Kitchen... 1 tablet under tongue at onset of chest pain; you may repeat every 5 minutes for up to 3 doses.  Problem # 2:  HYPERLIPIDEMIA (ICD-272.4)  Pravachol increased in late May for more aggressive LDL control. Will reassess later.  His updated medication list for this problem includes:     Pravachol 80 Mg Tabs (Pravastatin sodium) .Marland Kitchen... Take 1 tablet by mouth at bedtime  Problem # 3:  ESSENTIAL HYPERTENSION, BENIGN (ICD-401.1)  Blood pressure not optimally controlled. Increase lisinopril to 20 mg daily. Check BMET in 2 weeks.  His updated medication list for this problem includes:    Lisinopril 20 Mg Tabs (Lisinopril) .Marland Kitchen... Take 1 tablet by mouth once daily    Metoprolol Tartrate 25 Mg Tabs (Metoprolol tartrate) .Marland Kitchen... Take 1 tab daily    Aspir-low 81 Mg Tbec (Aspirin) .Marland Kitchen... Take 1 tab daily  Future Orders: T-Basic Metabolic Panel [id number]) ... 06/21/2010  Problem # 4:  TOBACCO ABUSE (ICD-305.1)  I again discussed the critical importance of smoking cessation with the patient.  Other Orders: 2-D Echocardiogram (2D Echo)  Patient Instructions: 1)  Your physician recommends that you schedule a follow-up appointment in: 3 months 2)  Your physician recommends that you return for lab work in: 2 weeks 3)  Your physician has recommended you make the following change in your medication: increase Lisinopril to 20mg  by mouth once daily and start taking Nitroglycerin as needed for chest pain 4)  Your physician recommended you take 1 tablet (or 1 spray) under tongue at onset of chest pain; you may repeat every 5 minutes for up to 3 doses. If 3 or more doses are required, call 911 and proceed to the ER immediately. 5)  Your physician has requested that you have an echocardiogram.  Echocardiography is a painless test that uses sound waves to create images of your heart. It provides your  doctor with information about the size and shape of your heart and how well your heart's chambers and valves are working.  This procedure takes approximately one hour. There are no restrictions for this procedure. Prescriptions: NITROSTAT 0.4 MG SUBL (NITROGLYCERIN) 1 tablet under tongue at onset of chest pain; you may repeat every 5 minutes for up to 3 doses.  #25 x 2   Entered by:   Larita Fife Via LPN    Authorized by:   Loreli Slot, MD, [facility]   Signed by:   Larita Fife Via LPN on 16/10/9603   Method used:   Electronically to        [facility]  [address] (retail)       [address]       [place], [place]  [address]       Ph: [phone]       Fax: [phone]   RxID:   [id number] LISINOPRIL 20 MG TABS (LISINOPRIL) take 1 tablet by mouth once daily  #30 x 3   Entered by:   Larita Fife Via LPN   Authorized by:   Loreli Slot, MD, [facility]   Signed by:   Larita Fife Via LPN on 10/27/2535   Method used:   Electronically to        [facility]  [address] (retail)       [address]       [place], [place]  [address]       Ph: [phone]       Fax: [phone]   RxID:   [id number]

## 2011-01-30 NOTE — Medication Information (Signed)
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 no    Recent/future dental: no  Any missed doses?: no       Is patient compliant with meds? yes       Allergies: 1)  ! * "citrus Foods"  Anticoagulation Management History:      The patient is taking warfarin and comes in today for a routine follow up visit.  He is being anticoagulated because of LV thrombus   /  CVA.  Positive risk factors for bleeding include history of CVA/TIA.  Negative risk factors for bleeding include an age less than 110 years old, no history of GI bleeding, and absence of serious comorbidities.  The bleeding index is 'intermediate risk'.  Positive CHADS2 values include History of HTN and Prior Stroke/CVA/TIA.  Negative CHADS2 values include History of CHF, Age > 61 years old, and History of Diabetes.  The start date was 09/13/2010.  Anticoagulation responsible provider: Dietrich Pates MD, Molly Maduro.  INR POC: 1.8.  Cuvette Lot#: [id number].    Anticoagulation Management Assessment/Plan:      The patient's current anticoagulation dose is Warfarin sodium 5 mg tabs: Take 1 tablet daily or as directed by coumadin clinic.  The target INR is 2.0-3.0.  The next INR is due 10/19/2010.  Anticoagulation instructions were given to patient.  Results were reviewed/authorized by Vashti Hey, RN.  He was notified by Vashti Hey RN.         Prior Anticoagulation Instructions: INR 2.4 Continue coumadin 5mg  once daily   Current Anticoagulation Instructions: INR 1.8 Increase coumadin to 5mg  once daily except 7.5mg  on Mondays and Thursdays

## 2011-01-30 NOTE — Letter (Signed)
Summary: [place] Results Engineer, agricultural at [facility]  [address] [place] [address]   Phone: [phone]  Fax: [phone]      June 26, 2010 MRN: [id number]   [facility] [address] [place], [place]  [address]   Dear Mr. Desta,  Your test ordered by Selena Batten has been reviewed by your physician (or physician assistant) and was found to be normal or stable. Your physician (or physician assistant) felt no changes were needed at this time.  ____ Echocardiogram  ____ Cardiac Stress Test  __X__ Lab Work  ____ Peripheral vascular study of arms, legs or neck  ____ CT scan or X-ray  ____ Lung or Breathing test  ____ Other: Please continue on current medical treatment.  Thank you.   [name], MD, F.A.C.C

## 2011-01-30 NOTE — Letter (Signed)
Summary: LABS  LABS   Imported By: Faythe Ghee 03/06/2010 13:10:37  _____________________________________________________________________  External Attachment:    Type:   Image     Comment:   External Document

## 2011-01-30 NOTE — Assessment & Plan Note (Signed)
Summary: F3M   Visit Type:  Follow-up Primary Provider:  [facility] Department   History of Present Illness: 45 year old male presents for followup. I last saw him back in June. Outside records from [place] reviewed at that time. A followup echocardiogram was arranged, showing LVEF estimated in the range of 45-50% as noted below. Also evidence of left ventricular mural thrombus, nonmobile. Patient indicates that he was on Coumadin in the past. Also has a history of stroke in the past.  Followup labs from June showed sodium 138, potassium 5.0, BUN 12, creatinine 1.0. More recent labs from 2 September showed cholesterol 159, triglyceride 120, HDL 41, and LDL 94. AST and ALT were normal.  Records indicate that the patient was admitted to [facility] recently in early September. He presented at that time with back and chest discomfort. He also described some numbness in his left hand and foot. CT Scan of the head showed evidence of an old stroke but no acute findings. He ruled out for myocardial infarction. Discharge summary indicates that he was to be scheduled for an outpatient followup Myoview and then followup in the office.  Mr. Din states he reported for a Myoview study done just yesterday with Dr. Dietrich Pates. Unfortunately no report is available for me to review at this time. I discussed this with the patient.  From a symptom perspective, he denies any recurrent chest pain or back pain. No palpitations or syncope. I reviewed his medications, and we also discussed reinitiating Coumadin in light of documentation of persistent left ventricular mural thrombus, and known cerebrovascular disease with prior stroke.  Current Medications (verified): 1)  Lisinopril 20 Mg Tabs (Lisinopril) .... Take 1 Tablet By Mouth Once Daily 2)  Pravachol 80 Mg Tabs (Pravastatin Sodium) .... Take 1 Tablet By Mouth At Bedtime 3)  Metoprolol Tartrate 25 Mg Tabs (Metoprolol Tartrate) .... Take 1  Tab Daily 4)  Aspir-Low 81 Mg Tbec (Aspirin) .... Take 1 Tab Daily 5)  Nitrostat 0.4 Mg Subl (Nitroglycerin) .Marland Kitchen.. 1 Tablet Under Tongue At Onset of Chest Pain; You May Repeat Every 5 Minutes For Up To 3 Doses.  Allergies (verified): 1)  ! * "citrus Foods"  Past History:  Social History: Last updated: 03/02/2010 Tobacco Use - Yes. 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         Otherwise reviewed and negative.  Vital Signs:  Patient profile:   45 year old male Weight:      144 pounds BMI:     22.64 Pulse rate:   59 / minute BP sitting:   118 / 81  (right arm)  Vitals Entered By: Dreama Saa, CNA (September 08, 2010 2:48 PM)  Physical Exam  Additional Exam:  Thin male in no acute distress. HEENT: Conjunctiva and lids normal, oropharynx with poor dentition. Neck: Supple, no carotid bruits or thyromegaly. Normal jugular venous pressure. Lungs: Clear to auscultation, nonlabored. Thorax: Well-healed midline sternal incision and upper abdominal keyhole incisions. Cardiac:  Regular rate and rhythm, no ventricular heave, no S3 gallop, soft systolic murmur. Abdomen: Soft, nontender, no hepatomegaly, bowel sounds present. No bruits. Skin: Warm and dry, scattered tattoos. Musculoskeletal: No gross deformities. Extremities: No pitting edema. Neuropsychiatric: Alert and oriented x3, affect grossly appropriate.   Impression  & Recommendations:  Problem # 1:  CORONARY ATHEROSCLEROSIS NATIVE CORONARY ARTERY (ICD-414.01)  History of multivessel disease as outlined above. Recent hospital admission with chest pain is noted, without evidence of definite acute coronary syndrome based on cardiac markers. Mr. Loberg underwent a Myoview study yesterday, results pending at this time. When these are available, plan to review, and most likely continue medical therapy, unless he has any substantial residual ischemic burden. Recent echocardiogram demonstrates improvement in LVEF overall compared to prior records. He is clinically stable at this point. I would like to see him back in one month's time, sooner if needed.  His updated medication list for this problem includes:    Lisinopril 20 Mg Tabs (Lisinopril) .Marland Kitchen... Take 1 tablet by mouth once daily    Metoprolol Tartrate 25 Mg Tabs (Metoprolol tartrate) .Marland Kitchen... Take 1 tab daily    Aspir-low 81 Mg Tbec (Aspirin) .Marland Kitchen... Take 1 tab daily    Nitrostat 0.4 Mg Subl (Nitroglycerin) .Marland Kitchen... 1 tablet under tongue at onset of chest pain; you may repeat every 5 minutes for up to 3 doses.  Problem # 2:  TOBACCO ABUSE (ICD-305.1)  I discussed smoking cessation with him again. Chantix would not be a good option for him. He reports not tolerating Wellbutrin in the past due to personality changes.  Problem # 3:  HYPERLIPIDEMIA (ICD-272.4)  LDL now under 100. Trend has been better.  His updated medication list for this problem includes:    Pravachol 80 Mg Tabs (Pravastatin sodium) .Marland Kitchen... Take 1 tablet by mouth at bedtime  Problem # 4:  ESSENTIAL HYPERTENSION, BENIGN (ICD-401.1)  Blood pressure well controlled today.  His updated medication list for this problem includes:    Lisinopril 20 Mg Tabs (Lisinopril) .Marland Kitchen... Take 1 tablet by mouth once daily    Metoprolol Tartrate 25 Mg Tabs (Metoprolol tartrate) .Marland Kitchen... Take 1 tab daily    Aspir-low 81 Mg Tbec (Aspirin) .Marland Kitchen... Take 1 tab daily  Problem #  5:  MURAL THROMBUS, LEFT VENTRICLE (ICD-429.79)  Noted in the past in association with previous stroke, on Coumadin for a period of time. Thrombus persists with substrate. We discussed this today, and our plan will be to reinitiate Coumadin in light of his risk for recurrent embolic events. He'll be enrolled in our Coumadin clinic next week.  Patient Instructions: 1)  Your physician recommends that you schedule a follow-up appointment in: 1 month 2)  Your physician recommends that you continue on your current medications as directed. Please refer to the Current Medication list given to you today.  3)  You have been referred to our Coumadin Clinic  Appended Document: F3M I was able to look at perfusion images from the patient's Myoview, done with Dr. Dietrich Pates on September 7. I could not locate the ECG strips or other data for review. Patient has evidence of a fairly large scar in the periapical and inferior distribution. No large areas of ischemia are evident. LVEF was 44%. Will await final report from Dr. Dietrich Pates, however it would seem that medical therapy we be our course of action at this time unless symptoms escalate.

## 2011-01-30 NOTE — Assessment & Plan Note (Signed)
Summary: **PER VICKY @ [facility] PER KAROL ROBINSON FOR HX OF HEART SURGERY/TG   Visit Type:  Initial Consult Primary Provider:  [facility] Department   History of Present Illness: 45 year old male referred to our office to establish regular cardiology followup. I have no records regarding this patient's previous cardiovascular history. He states that he moved here in January from [place] where he had previously undergone three-vessel coronary artery bypass grafting in 2006 associated with what sounds like a ventricular aneurysmectomy. He states that he last saw his regular cardiologist in November of 2010. He does not recall any specific cardiovascular testing over the last year. Additional history includes hypertension and hyperlipidemia as well as previous "stroke" back in 2004.  Mr. Marczak denies any active anginal chest pain or breathlessness beyond NYHA class II. He is presently unemployed and does not exercise. He has also returned to smoking cigarettes, after quitting in the past. He reports being under a lot of stress in the last year, and moving to this area for "a new start."  He does not recall any specific information about ejection fraction. He has no history of palpitations or syncope.  Recent labs obtained at the health department show hemoglobin 16.3, platelets 273, potassium 4.8, sodium 137, BUN 21, creatinine 1.0, total cholesterol 257, triglycerides 322, HDL 45, LDL 148.  Current Medications (verified): 1)  Lisinopril 10 Mg Tabs (Lisinopril) .... Take 1 Tab Daily 2)  Pravastatin Sodium 40 Mg Tabs (Pravastatin Sodium) .... Take 1 Tab Daily 3)  Metoprolol Tartrate 25 Mg Tabs (Metoprolol Tartrate) .... Take 1 Tab Daily 4)  Aspir-Low 81 Mg Tbec (Aspirin) .... Take 1 Tab Daily  Allergies (verified): 1)  ! * "citrus Foods"  Past History:  Family History: Last updated: 03/02/2010 Father: unknown Mother: hypertension, diabetes mellitus, cardiovascular  disease  Social History: Last updated: 03/02/2010 Tobacco Use - Yes. 1 1/2 packs daily Alcohol Use - no Regular Exercise - no Drug Use - former, marijuana for several years  Past Medical History: CAD - presumably multivessel, LVEF unknown Cerebrovascular Disease - stroke 2004 Hyperlipidemia Hypertension  Past Surgical History: CABG and possible aneurysmectomy 2006, details not yet available Eye surgery as a child  Family History: Father: unknown Mother: hypertension, diabetes mellitus, cardiovascular disease  Social History: Tobacco Use - Yes. 1 1/2 packs daily Alcohol Use - no Regular Exercise - no Drug Use - former, marijuana for several years  Review of Systems  The patient denies anorexia, fever, weight loss, vision loss, chest pain, syncope, dyspnea on exertion, peripheral edema, prolonged cough, hemoptysis, abdominal pain, melena, hematochezia, and severe indigestion/heartburn.         Occasional headaches. Somewhat "depressed mood" at times. No suicidal ideation. Otherwise reviewed and negative except as outlined above.  Vital Signs:  Patient profile:   45 year old male Height:      67 inches Weight:      145 pounds BMI:     22.79 Pulse rate:   56 / minute BP sitting:   129 / 78  (right arm)  Vitals Entered By: Dreama Saa, CNA (March 02, 2010 12:43 PM)  Physical Exam  Additional Exam:  Thin male in no acute distress. HEENT: Conjunctiva and lids normal, oropharynx with poor dentition. Neck: Supple, no carotid bruits or thyromegaly. Normal jugular venous pressure. Lungs: Clear to auscultation, nonlabored. Thorax: Well-healed midline sternal incision and upper abdominal keyhole incisions. Cardiac: Regular rate and rhythm, no ventricular heave, no S3 gallop, soft systolic murmur. Abdomen: Soft,  nontender, no hepatomegaly, bowel sounds present. No bruits. Skin: Warm and dry, scattered tattoos. Musculoskeletal: No gross deformities. Extremities: No pitting  edema. Neuropsychiatric: Alert and oriented x3, affect grossly appropriate.   EKG  Procedure date:  03/02/2010  Findings:      No old tracing for comparison. Sinus bradycardia at 53 beats per minute with left anterior fascicular block, LVH, and evidence of probable prior anterolateral myocardial infarction with residual ST-T wave changes. Could also be consistent with apparent history of aneurysmectomy.  Impression & Recommendations:  Problem # 1:  CORONARY ATHEROSCLEROSIS NATIVE CORONARY ARTERY (ICD-414.01)  Apparent history of multivessel coronary artery disease status post three-vessel coronary artery bypass grafting with possible aneurysmectomy in [place] back in 2006, no details available as yet. Symptomatically Mr. Muratalla denies any angina or progressive shortness of breath, and has no obvious heart failure symptoms. I reviewed his medications outlined above. He has not been consistent with regular medical therapy, except for perhaps the last 3 months by his account. His electrocardiogram is abnormal as detailed above, without prior tracings available for comparison. We plan to request his prior cardiac records, and can then establish followup over the next few months, determining if any additional testing for followup is necessary.  His updated medication list for this problem includes:    Lisinopril 10 Mg Tabs (Lisinopril) .Marland Kitchen... Take 1 tab daily    Metoprolol Tartrate 25 Mg Tabs (Metoprolol tartrate) .Marland Kitchen... Take 1 tab daily    Aspir-low 81 Mg Tbec (Aspirin) .Marland Kitchen... Take 1 tab daily  Problem # 2:  HYPERLIPIDEMIA (ICD-272.4)  Recent LDL of 148. Pravachol was just added by the health department. We will obtain a followup fasting lipid profile and liver function tests over the next 12 weeks. Optimally LDL should be closer to 70.  His updated medication list for this problem includes:    Pravastatin Sodium 40 Mg Tabs (Pravastatin sodium) .Marland Kitchen... Take 1 tab daily  Future Orders: T-Lipid  Profile ([id number]) ... 05/29/2010 T-Hepatic Function [id number]) ... 05/29/2010  Problem # 3:  ESSENTIAL HYPERTENSION, BENIGN (ICD-401.1)  Blood pressure reasonbly controlled today.  His updated medication list for this problem includes:    Lisinopril 10 Mg Tabs (Lisinopril) .Marland Kitchen... Take 1 tab daily    Metoprolol Tartrate 25 Mg Tabs (Metoprolol tartrate) .Marland Kitchen... Take 1 tab daily    Aspir-low 81 Mg Tbec (Aspirin) .Marland Kitchen... Take 1 tab daily  Problem # 4:  CVA (ICD-434.91)  No records available regarding details of a described stroke in 2004. Hopefully his medical records will further elucidate this.  His updated medication list for this problem includes:    Aspir-low 81 Mg Tbec (Aspirin) .Marland Kitchen... Take 1 tab daily  Problem # 5:  TOBACCO ABUSE (ICD-305.1)  I discussed the critical importance of smoking cessation with the patient today.  Patient Instructions: 1)  Your physician recommends that you schedule a follow-up appointment in: 3 months 2)  Your physician recommends that you return for lab work in: 3 months, just before next visit. 3)  Your physician recommends that you continue on your current medications as directed. Please refer to the Current Medication list given to you today.  Appended Document: **PER VICKY @ [facility] PER KAROL ROBINSON FOR HX OF HEART SURGERY/TG We received records from [facility] regarding the patient's prior cardiac history. 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 I  note that he was found to have severe proximal left anterior descending disease at catheterization in 1992, with subsequent findings of progressive multivessel disease, although the details of his anatomy are not available from the time of surgery. He underwent bypass grafting  in August of 2006 at [facility] including a LIMA to the first diagonal, SVG to posterior lateral branch of the circumflex, and SVG to an RV branch of a nondominant right coronary artery. He underwent DOR anterior ventricular restoration surgery at the same time. The operative report indicates that approximately half of the ventricular chamber cavity was reduced. 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 No other significant valvular disease as described. More recently the patient was seen in cardiology consultation at [facility] Room in the setting of atypical chest pain. He was evaluated by a Dr. Nelda Severe. His note indicates that the patient had not kept regular scheduled followup visits or taken his medications.

## 2011-01-30 NOTE — Letter (Signed)
Summary: PRE OP  PRE OP   Imported By: Faythe Ghee 03/06/2010 13:11:51  _____________________________________________________________________  External Attachment:    Type:   Image     Comment:   External Document

## 2011-02-01 ENCOUNTER — Encounter (INDEPENDENT_AMBULATORY_CARE_PROVIDER_SITE_OTHER): Payer: Medicaid Other

## 2011-02-01 ENCOUNTER — Ambulatory Visit: Admit: 2011-02-01 | Payer: Self-pay

## 2011-02-01 ENCOUNTER — Encounter: Payer: Self-pay | Admitting: Cardiology

## 2011-02-01 DIAGNOSIS — I238 Other current complications following acute myocardial infarction: Secondary | ICD-10-CM

## 2011-02-01 DIAGNOSIS — Z7901 Long term (current) use of anticoagulants: Secondary | ICD-10-CM

## 2011-02-01 LAB — CONVERTED CEMR LAB: POC INR: 1.6

## 2011-02-01 NOTE — Medication Information (Signed)
Summary: [id number]-lr  Anticoagulant Therapy  Managed by: Vashti Hey, RN PCP: [facility] Department Supervising MD: Dietrich Pates MD, Molly Maduro Indication 1: LVl thrombus 429.79 Indication 2: Cerebrovascular Accident Lab Used: [facility] INR POC 2.0  Dietary changes: no    Health status changes: no    Bleeding/hemorrhagic complications: no    Recent/future hospitalizations: no    Any changes in medication regimen? no    Recent/future dental: no  Any missed doses?: no       Is patient compliant with meds? yes       Allergies: 1)  ! * "citrus Foods"  Anticoagulation Management History:      The patient is taking warfarin and comes in today for a routine follow up visit.  Warfarin therapy is being given due to LV thrombus   /  CVA.  Positive risk factors for bleeding include history of CVA/TIA.  Negative risk factors for bleeding include an age less than 29 years old, no history of GI bleeding, and absence of serious comorbidities.  The bleeding index is 'intermediate risk'.  Positive CHADS2 values include History of HTN and Prior Stroke/CVA/TIA.  Negative CHADS2 values include History of CHF, Age > 18 years old, and History of Diabetes.  The start date was 09/13/2010.  Anticoagulation responsible provider: Dietrich Pates MD, Molly Maduro.  INR POC: 2.0.  Cuvette Lot#: [id number].    Anticoagulation Management Assessment/Plan:      The patient's current anticoagulation dose is Warfarin sodium 5 mg tabs: Take 1 tablet daily or as directed by coumadin clinic.  The target INR is 2.0-3.0.  The next INR is due 02/01/2011.  Anticoagulation instructions were given to patient.  Results were reviewed/authorized by Vashti Hey, RN.  He was notified by Vashti Hey RN.         Prior Anticoagulation Instructions: INR 2.8 Continue coumadin 5mg  once daily except 7.5mg  on Mondays and Fridays  Current Anticoagulation Instructions: INR 2.0 Take coumadin 1 1/2 tablets tonight then resume 1 tablet  once daily except 1 1/2 tablets on Mondays and Fridays

## 2011-02-02 ENCOUNTER — Ambulatory Visit: Admit: 2011-02-02 | Payer: Self-pay | Admitting: Cardiology

## 2011-02-02 ENCOUNTER — Ambulatory Visit (INDEPENDENT_AMBULATORY_CARE_PROVIDER_SITE_OTHER): Payer: Medicaid Other | Admitting: Cardiology

## 2011-02-02 ENCOUNTER — Encounter: Payer: Self-pay | Admitting: Cardiology

## 2011-02-02 DIAGNOSIS — R269 Unspecified abnormalities of gait and mobility: Secondary | ICD-10-CM | POA: Insufficient documentation

## 2011-02-02 DIAGNOSIS — E782 Mixed hyperlipidemia: Secondary | ICD-10-CM

## 2011-02-02 DIAGNOSIS — F17200 Nicotine dependence, unspecified, uncomplicated: Secondary | ICD-10-CM

## 2011-02-02 DIAGNOSIS — I2589 Other forms of chronic ischemic heart disease: Secondary | ICD-10-CM

## 2011-02-02 DIAGNOSIS — I2510 Atherosclerotic heart disease of native coronary artery without angina pectoris: Secondary | ICD-10-CM

## 2011-02-07 NOTE — Assessment & Plan Note (Signed)
Summary: f59m/per pt checkout on 10/02/10/tmj/sch per pt walk in tmj/tg   Vital Signs:  Patient profile:   45 year old male Weight:      157 pounds BMI:     24.68 Pulse rate:   85 / minute BP sitting:   141 / 94  (left arm)  Vitals Entered By: Dreama Saa, CNA (February 02, 2011 2:24 PM)  Visit Type:  Follow-up Primary Provider:  [facility]   History of Present Illness: 45 year old male presents for followup. He was last seen back in October 2011. He is here with his wife.  He states that in general he has been doing fairly well, reports compliance with his medications, no active angina, NYHA class II dyspnea on exertion. He reports no bleeding problems on Coumadin.  He continues to smoke cigarettes. We discussed smoking cessation strategies again today. He is not yet ready to pick a quit date.  He does state that he has had progressive trouble with his balance. He states that sometimes when he goes to walk in a certain direction he seems to stumble to one side for a few seconds. He reports having difficulty with this off and on since a stroke back in 2004, but it's been worse over the last 6 months. He has had some trouble with his memory, although no reported speech deficits or focal motor weakness. No palpitations or syncope.  No recent followup lipid panel or liver function tests.  Current Medications (verified): 1)  Lisinopril 20 Mg Tabs (Lisinopril) .... Take 1 Tablet By Mouth Once Daily 2)  Pravachol 80 Mg Tabs (Pravastatin Sodium) .... Take 1 Tablet By Mouth At Bedtime 3)  Metoprolol Tartrate 25 Mg Tabs (Metoprolol Tartrate) .... Take 1 Tab Daily 4)  Aspir-Low 81 Mg Tbec (Aspirin) .... Take 1 Tab Daily 5)  Nitrostat 0.4 Mg Subl (Nitroglycerin) .Marland Kitchen.. 1 Tablet Under Tongue At Onset of Chest Pain; You May Repeat Every 5 Minutes For Up To 3 Doses. 6)  Warfarin Sodium 5 Mg Tabs (Warfarin Sodium) .... Take 1 Tablet Daily or As Directed By Coumadin  Clinic 7)  Mirtazapine 15 Mg Tabs (Mirtazapine) .... Take 1 Tab At Bedtime 8)  Lexapro 10 Mg Tabs (Escitalopram Oxalate) .... Take 1 Tab Daily  Allergies (verified): 1)  ! * "citrus Foods"  Comments:  Nurse/Medical Assistant: patient brought meds he needs metoprolol sent to [facility] in [place]  Past History:  Past Medical History: Last updated: 09/08/2010 CAD - multivessel, LVEF 45-50% Cerebrovascular Disease - stroke 2004 Hyperlipidemia Hypertension Ventricular apical mural thrombus  Past Surgical History: Last updated: 09/08/2010 CABG and DOR anterior ventricular restoration surgery 8/06, [facility] [place] to first diagonal, SVG to PLB, SVG to RVE branch of nondominant RCA Eye surgery as a child  Social History: Last updated: 03/02/2010 Tobacco Use - Yes. 1 1/2 packs daily Alcohol Use - no Regular Exercise - no Drug Use - former, marijuana for several years  Review of Systems  The patient denies anorexia, fever, weight loss, chest pain, syncope, peripheral edema, prolonged cough, headaches, melena, and hematochezia.         Otherwise reviewed and negative except as outlined above.  Physical Exam  Additional Exam:  Thin male in no acute distress. HEENT: Conjunctiva and lids normal, oropharynx with poor dentition. Neck: Supple, no carotid bruits or thyromegaly. Normal jugular venous pressure. Lungs: Clear to auscultation, nonlabored. Thorax: Well-healed midline sternal incision and upper abdominal keyhole incisions. Cardiac: Regular rate and rhythm, no  ventricular heave, no S3 gallop, soft systolic murmur. Abdomen: Soft, nontender, no hepatomegaly, bowel sounds present. No bruits. Skin: Warm and dry, scattered tattoos. Musculoskeletal: No gross deformities. Extremities: No pitting edema. Neuropsychiatric: Alert and oriented x3, affect grossly appropriate. No focal motor weakness, no nystagmus.   Impression & Recommendations:  Problem # 1:  CORONARY  ATHEROSCLEROSIS NATIVE CORONARY ARTERY (ICD-414.01)  No reported angina. Followup Myoview from September 2011 is reviewed below. Plan to continue medical therapy at this point. Followup in 6 months.  His updated medication list for this problem includes:    Lisinopril 20 Mg Tabs (Lisinopril) .Marland Kitchen... Take 1 tablet by mouth once daily    Metoprolol Tartrate 25 Mg Tabs (Metoprolol tartrate) .Marland Kitchen... Take 1 tab daily    Aspir-low 81 Mg Tbec (Aspirin) .Marland Kitchen... Take 1 tab daily    Nitrostat 0.4 Mg Subl (Nitroglycerin) .Marland Kitchen... 1 tablet under tongue at onset of chest pain; you may repeat every 5 minutes for up to 3 doses.    Warfarin Sodium 5 Mg Tabs (Warfarin sodium) .Marland Kitchen... Take 1 tablet daily or as directed by coumadin clinic  Problem # 2:  MURAL THROMBUS, LEFT VENTRICLE (ICD-429.79)  Continue on Coumadin, LVEF approximately 45-50%, status post previous DOR anterior ventricular restoration surgery in 2006.  Problem # 3:  TOBACCO ABUSE (ICD-305.1)  Today we again discussed smoking cessation strategies. He has not been able to quit.  Problem # 4:  HYPERLIPIDEMIA (ICD-272.4)  Followup fasting lipid profile and liver function tests.  His updated medication list for this problem includes:    Pravachol 80 Mg Tabs (Pravastatin sodium) .Marland Kitchen... Take 1 tablet by mouth at bedtime  Orders: T-Lipid Profile ([id number]) T-Hepatic Function ([id number])  Problem # 5:  CVA (ICD-434.91)  History of stroke back in 2004. Patient reports progressive problems with balance over the last 6 months as noted above. Plan to make a referral to [name] for a formal neurological consultation.  His updated medication list for this problem includes:    Aspir-low 81 Mg Tbec (Aspirin) .Marland Kitchen... Take 1 tab daily    Warfarin Sodium 5 Mg Tabs (Warfarin sodium) .Marland Kitchen... Take 1 tablet daily or as directed by coumadin clinic  Other Orders: Misc. Referral (Misc. Ref)  Patient Instructions: 1)  Your physician recommends that you  schedule a follow-up appointment in: 6 months 2)  Your physician recommends that you return for lab work in: Next week 3)  Your physician has referred you to Dr. Judithann Sheen for unsteady gait Prescriptions: WARFARIN SODIUM 5 MG TABS (WARFARIN SODIUM) Take 1 tablet daily or as directed by coumadin clinic  #45 x 2   Entered by:   Larita Fife Via LPN   Authorized by:   Loreli Slot, MD, [facility]   Signed by:   Larita Fife Via LPN on 52/84/1324   Method used:   Electronically to        [facility]  [address] (retail)       [address]       [place], [place]  [address]       Ph: [phone]       Fax: [phone]   RxID:   [id number] METOPROLOL TARTRATE 25 MG TABS (METOPROLOL TARTRATE) take 1 tab daily  #30 x 6   Entered by:   Larita Fife Via LPN   Authorized by:   Loreli Slot, MD, [facility]   Signed by:   Larita Fife Via LPN on 06/29/1600   Method used:  Electronically to        [facility]  [address] (retail)       [address]       [place], [place]  [address]       Ph: [phone]       Fax: [phone]   RxID:   [id number] PRAVACHOL 80 MG TABS (PRAVASTATIN SODIUM) take 1 tablet by mouth at bedtime  #30 x 6   Entered by:   Larita Fife Via LPN   Authorized by:   Loreli Slot, MD, [facility]   Signed by:   Larita Fife Via LPN on 84/13/2440   Method used:   Electronically to        [facility]  [address] (retail)       [address]       [place], [place]  [address]       Ph: [phone]       Fax: [phone]   RxID:   [id number] LISINOPRIL 20 MG TABS (LISINOPRIL) take 1 tablet by mouth once daily  #30 x 6   Entered by:   Larita Fife Via LPN   Authorized by:   Loreli Slot, MD, [facility]   Signed by:   Larita Fife Via LPN on 30/16/0109   Method used:   Electronically to        [facility]  [address] (retail)       [address]       [place], [place]  [address]       Ph: [phone]       Fax: [phone]   RxID:    [id number]    Orders Added: 1)  T-Lipid Profile [id number] 2)  T-Hepatic Function [[id number]] 3)  Misc. Referral [Misc. Ref]      Echocardiogram  Procedure date:  06/08/2010  Findings:       Study Conclusions    - Left ventricle: The cavity size was mildly dilated. There was mild     hypertrophy of the septum. Systolic function was mildly reduced.     The estimated ejection fraction was in the range of 45% to 50%.     Akinesis of the distal anteroseptal and apical myocardium. There     was a small, flat (mural), fixed, apical thrombus associated with     an akinetic segment.   - Mitral valve: Calcified annulus.   - Atrial septum: No defect or patent foramen ovale was ide  Nuclear Study  Procedure date:  09/06/2010  Findings:       IMPRESSION:   Abnormal stress nuclear myocardial study revealing  somewhat   impaired exercise capacity for age, significant ST-segment changes   with exercise in the absence of angina, mild left ventricular   dilatation and dysfunction in a segmental pattern and scintigraphic   evidence for infarction in the distal distribution of the LAD most   likely or perhaps the PDA.  Minimal ascites peri-infarction   ischemia.  Other findings as noted.

## 2011-02-07 NOTE — Medication Information (Signed)
Summary: ccr-lr [place]  Anticoagulant Therapy  Managed by: Vashti Hey, RN PCP: [facility] Department Supervising MD: Dietrich Pates MD, Molly Maduro Indication 1: LVl thrombus 429.79 Indication 2: Cerebrovascular Accident Lab Used: [facility] INR POC 1.6  Dietary changes: no    Health status changes: yes       Details: Had head cold  Bleeding/hemorrhagic complications: no    Recent/future hospitalizations: no    Any changes in medication regimen? no    Recent/future dental: no  Any missed doses?: yes     Details: Missed 1 dose this week  Is patient compliant with meds? yes       Allergies: 1)  ! * "citrus Foods"  Anticoagulation Management History:      The patient is taking warfarin and comes in today for a routine follow up visit.  Anticoagulation is being administered due to LV thrombus   /  CVA.  Positive risk factors for bleeding include history of CVA/TIA.  Negative risk factors for bleeding include an age less than 3 years old, no history of GI bleeding, and absence of serious comorbidities.  The bleeding index is 'intermediate risk'.  Positive CHADS2 values include History of HTN and Prior Stroke/CVA/TIA.  Negative CHADS2 values include History of CHF, Age > 34 years old, and History of Diabetes.  The start date was 09/13/2010.  Anticoagulation responsible provider: Dietrich Pates MD, Molly Maduro.  INR POC: 1.6.  Cuvette Lot#: [id number].    Anticoagulation Management Assessment/Plan:      The patient's current anticoagulation dose is Warfarin sodium 5 mg tabs: Take 1 tablet daily or as directed by coumadin clinic.  The target INR is 2.0-3.0.  The next INR is due 02/15/2011.  Anticoagulation instructions were given to patient.  Results were reviewed/authorized by Vashti Hey, RN.  He was notified by Vashti Hey RN.         Prior Anticoagulation Instructions: INR 2.0 Take coumadin 1 1/2 tablets tonight then resume 1 tablet once daily except 1 1/2 tablets on Mondays and  Fridays  Current Anticoagulation Instructions: INR 1.6 Take coumadin 2 tablets tonight and tomorrow night then resume 1 tablet once daily except 1 1/2 tablets on Mondays and Fridays

## 2011-02-12 ENCOUNTER — Encounter: Payer: Self-pay | Admitting: Cardiology

## 2011-02-16 ENCOUNTER — Encounter: Payer: Self-pay | Admitting: Cardiology

## 2011-02-16 LAB — CONVERTED CEMR LAB
ALT: 28 units/L (ref 0–53)
Albumin: 4.2 g/dL (ref 3.5–5.2)
LDL Cholesterol: 128 mg/dL — ABNORMAL HIGH (ref 0–99)
Total Protein: 7 g/dL (ref 6.0–8.3)
Triglycerides: 149 mg/dL (ref <150)

## 2011-02-21 NOTE — Letter (Signed)
Summary: [facility] Work [facility], agricultural at [employer]  [address], [place] [address]   Phone: [phone]  Fax: [phone]     February 16, 2011 MRN: [id number]   [facility] [address] [place], [place]  [address]      YOUR LAB WORK IS DUE  March 26, 2011 _________________________________________  Please go to [facility], located across the street from [facility] on the second floor.  Hours are Monday - Friday 7am until 7:30pm         Saturday 8am until 12noon    _X_  DO NOT EAT OR DRINK AFTER MIDNIGHT EVENING PRIOR TO LABWORK  __ YOUR LABWORK IS NOT FASTING --YOU MAY EAT PRIOR TO LABWORK

## 2011-02-21 NOTE — Letter (Signed)
Summary: Generic Letter  Architectural technologist at [facility]  [address], [place] [address]   Phone: [phone]  Fax: [phone]        February 12, 2011 MRN: [id number]    [facility] [address] [place], [place]  [address]    Dear Mr. Isenhower,     This office has been trying to reach you by telephone since 02-05-11   without success.  When you receive this letter please call office at   [phone] to discuss your lab results.      Sincerely,  [name], MD, [facility] This letter has been electronically signed by your physician.

## 2011-02-22 ENCOUNTER — Encounter: Payer: Self-pay | Admitting: Cardiology

## 2011-02-22 ENCOUNTER — Encounter (INDEPENDENT_AMBULATORY_CARE_PROVIDER_SITE_OTHER): Payer: Medicaid Other

## 2011-02-22 DIAGNOSIS — I238 Other current complications following acute myocardial infarction: Secondary | ICD-10-CM

## 2011-02-22 DIAGNOSIS — Z7901 Long term (current) use of anticoagulants: Secondary | ICD-10-CM

## 2011-02-22 LAB — CONVERTED CEMR LAB: POC INR: 3.4

## 2011-02-27 NOTE — Medication Information (Signed)
Summary: ccr-lr  Anticoagulant Therapy  Managed by: Vashti Hey, RN PCP: [facility] Department Supervising MD: Dietrich Pates MD, Molly Maduro Indication 1: LVl thrombus 429.79 Indication 2: Cerebrovascular Accident Lab Used: [facility] INR POC 3.4  Dietary changes: no    Health status changes: no    Bleeding/hemorrhagic complications: no    Recent/future hospitalizations: no    Any changes in medication regimen? no    Recent/future dental: no  Any missed doses?: no       Is patient compliant with meds? yes       Allergies: 1)  ! * "citrus Foods"  Anticoagulation Management History:      The patient is taking warfarin and comes in today for a routine follow up visit.  Anticoagulation is being administered due to LV thrombus   /  CVA.  Positive risk factors for bleeding include history of CVA/TIA.  Negative risk factors for bleeding include an age less than 87 years old, no history of GI bleeding, and absence of serious comorbidities.  The bleeding index is 'intermediate risk'.  Positive CHADS2 values include History of HTN and Prior Stroke/CVA/TIA.  Negative CHADS2 values include History of CHF, Age > 62 years old, and History of Diabetes.  The start date was 09/13/2010.  Anticoagulation responsible provider: Dietrich Pates MD, Molly Maduro.  INR POC: 3.4.  Cuvette Lot#: [id number].    Anticoagulation Management Assessment/Plan:      The patient's current anticoagulation dose is Warfarin sodium 5 mg tabs: Take 1 tablet daily or as directed by coumadin clinic.  The target INR is 2.0-3.0.  The next INR is due 03/15/2011.  Anticoagulation instructions were given to patient.  Results were reviewed/authorized by Vashti Hey, RN.  He was notified by Vashti Hey RN.         Prior Anticoagulation Instructions: INR 1.6 Take coumadin 2 tablets tonight and tomorrow night then resume 1 tablet once daily except 1 1/2 tablets on Mondays and Fridays  Current Anticoagulation Instructions: INR  3.4 Hold coumadin tonight then resume 5mg  once daily except 7.5mg  on Mondays and Fridays

## 2011-03-14 ENCOUNTER — Encounter (INDEPENDENT_AMBULATORY_CARE_PROVIDER_SITE_OTHER): Payer: Medicaid Other

## 2011-03-14 ENCOUNTER — Encounter: Payer: Self-pay | Admitting: Cardiology

## 2011-03-14 ENCOUNTER — Other Ambulatory Visit: Payer: Self-pay | Admitting: Neurology

## 2011-03-14 DIAGNOSIS — R260 Ataxic gait: Secondary | ICD-10-CM

## 2011-03-14 DIAGNOSIS — I6789 Other cerebrovascular disease: Secondary | ICD-10-CM

## 2011-03-14 DIAGNOSIS — I238 Other current complications following acute myocardial infarction: Secondary | ICD-10-CM

## 2011-03-15 LAB — BASIC METABOLIC PANEL
BUN: 12 mg/dL (ref 6–23)
Calcium: 9.3 mg/dL (ref 8.4–10.5)
Chloride: 104 mEq/L (ref 96–112)
Creatinine, Ser: 1.1 mg/dL (ref 0.4–1.5)
Glucose, Bld: 92 mg/dL (ref 70–99)
Sodium: 137 mEq/L (ref 135–145)

## 2011-03-15 LAB — CBC
HCT: 43.5 % (ref 39.0–52.0)
Hemoglobin: 14.7 g/dL (ref 13.0–17.0)
Hemoglobin: 14.5 g/dL (ref 13.0–17.0)
MCH: 31.2 pg (ref 26.0–34.0)
MCHC: 33.7 g/dL (ref 30.0–36.0)
MCHC: 34.5 g/dL (ref 30.0–36.0)
MCV: 92.5 fL (ref 78.0–100.0)
Platelets: 200 K/uL (ref 150–400)
RBC: 4.7 MIL/uL (ref 4.22–5.81)
RDW: 13.6 % (ref 11.5–15.5)
RDW: 13.6 % (ref 11.5–15.5)
WBC: 6.5 10*3/uL (ref 4.0–10.5)
WBC: 6.2 10*3/uL (ref 4.0–10.5)

## 2011-03-15 LAB — DIFFERENTIAL
Basophils Absolute: 0 10*3/uL (ref 0.0–0.1)
Basophils Relative: 1 % (ref 0–1)
Eosinophils Absolute: 0.3 10*3/uL (ref 0.0–0.7)
Eosinophils Relative: 5 % (ref 0–5)
Lymphocytes Relative: 42 % (ref 12–46)
Lymphs Abs: 2.7 K/uL (ref 0.7–4.0)
Monocytes Absolute: 0.7 10*3/uL (ref 0.1–1.0)
Monocytes Relative: 11 % (ref 3–12)
Neutro Abs: 2.7 K/uL (ref 1.7–7.7)
Neutrophils Relative %: 41 % — ABNORMAL LOW (ref 43–77)

## 2011-03-15 LAB — POCT CARDIAC MARKERS
CKMB, poc: <1 ng/mL — ABNORMAL LOW (ref 1.0–8.0)
CKMB, poc: <1 ng/mL — ABNORMAL LOW (ref 1.0–8.0)
Myoglobin, poc: 43.5 ng/mL (ref 12–200)
Myoglobin, poc: 51.6 ng/mL (ref 12–200)
Troponin i, poc: <0.05 ng/mL (ref 0.00–0.09)
Troponin i, poc: <0.05 ng/mL (ref 0.00–0.09)

## 2011-03-15 LAB — CARDIAC PANEL(CRET KIN+CKTOT+MB+TROPI)
CK, MB: 0.9 ng/mL (ref 0.3–4.0)
CK, MB: 1 ng/mL (ref 0.3–4.0)
Relative Index: INVALID (ref 0.0–2.5)
Total CK: 63 U/L (ref 7–232)
Total CK: 72 U/L (ref 7–232)
Troponin I: 0.01 ng/mL (ref 0.00–0.06)
Troponin I: 0.06 ng/mL (ref 0.00–0.06)

## 2011-03-15 LAB — BASIC METABOLIC PANEL WITH GFR
CO2: 26 meq/L (ref 19–32)
GFR calc Af Amer: >60 mL/min (ref >60)
GFR calc non Af Amer: >60 mL/min (ref >60)
Potassium: 4.6 meq/L (ref 3.5–5.1)

## 2011-03-15 LAB — PROTIME-INR
INR: 0.89 (ref 0.00–1.49)
Prothrombin Time: 12.2 s (ref 11.6–15.2)

## 2011-03-15 LAB — COMPREHENSIVE METABOLIC PANEL
AST: 25 U/L (ref 0–37)
Albumin: 3.4 g/dL — ABNORMAL LOW (ref 3.5–5.2)
Alkaline Phosphatase: 77 U/L (ref 39–117)
BUN: 12 mg/dL (ref 6–23)
Calcium: 9.3 mg/dL (ref 8.4–10.5)
Chloride: 106 mEq/L (ref 96–112)
Creatinine, Ser: 1.09 mg/dL (ref 0.4–1.5)
Potassium: 4.6 mEq/L (ref 3.5–5.1)
Sodium: 141 mEq/L (ref 135–145)

## 2011-03-15 LAB — HEMOGLOBIN A1C: Hgb A1c MFr Bld: 6 % — ABNORMAL HIGH (ref <5.7)

## 2011-03-15 LAB — LIPID PANEL
HDL: 42 mg/dL (ref >39)
LDL Cholesterol: 93 mg/dL (ref 0–99)
Total CHOL/HDL Ratio: 4 RATIO
Triglycerides: 158 mg/dL — ABNORMAL HIGH (ref <150)

## 2011-03-15 LAB — TSH: TSH: 2.934 u[IU]/mL (ref 0.350–4.500)

## 2011-03-15 LAB — APTT: aPTT: 26 seconds (ref 24–37)

## 2011-03-19 ENCOUNTER — Encounter: Payer: Self-pay | Admitting: Cardiology

## 2011-03-20 ENCOUNTER — Ambulatory Visit (HOSPITAL_COMMUNITY)
Admission: RE | Admit: 2011-03-20 | Discharge: 2011-03-20 | Disposition: A | Discharge status: Home / Self Care | Payer: Medicaid Other | Source: Ambulatory Visit | Attending: Neurology | Admitting: Neurology

## 2011-03-20 ENCOUNTER — Encounter: Payer: Self-pay | Admitting: Cardiology

## 2011-03-20 DIAGNOSIS — R269 Unspecified abnormalities of gait and mobility: Secondary | ICD-10-CM | POA: Insufficient documentation

## 2011-03-20 DIAGNOSIS — R260 Ataxic gait: Secondary | ICD-10-CM

## 2011-03-20 DIAGNOSIS — R42 Dizziness and giddiness: Secondary | ICD-10-CM | POA: Insufficient documentation

## 2011-03-20 DIAGNOSIS — G9389 Other specified disorders of brain: Secondary | ICD-10-CM | POA: Insufficient documentation

## 2011-03-20 LAB — BASIC METABOLIC PANEL
BUN: 11 mg/dL (ref 6–23)
CO2: 24 mEq/L (ref 19–32)
Calcium: 9.3 mg/dL (ref 8.4–10.5)
GFR calc non Af Amer: >60 mL/min (ref >60)
Glucose, Bld: 108 mg/dL — ABNORMAL HIGH (ref 70–99)
Potassium: 3.9 mEq/L (ref 3.5–5.1)

## 2011-03-20 LAB — CBC
HCT: 45.7 % (ref 39.0–52.0)
Hemoglobin: 16.1 g/dL (ref 13.0–17.0)
MCHC: 35.3 g/dL (ref 30.0–36.0)
Platelets: 233 10*3/uL (ref 150–400)
RDW: 13.4 % (ref 11.5–15.5)

## 2011-03-20 LAB — DIFFERENTIAL
Basophils Absolute: 0.1 10*3/uL (ref 0.0–0.1)
Basophils Relative: 1 % (ref 0–1)
Eosinophils Relative: 1 % (ref 0–5)
Lymphocytes Relative: 20 % (ref 12–46)
Monocytes Absolute: 0.8 10*3/uL (ref 0.1–1.0)

## 2011-03-20 NOTE — Medication Information (Signed)
Summary: [facility]  Anticoagulant Therapy  Managed by: Vashti Hey, RN PCP: [facility] Department Supervising MD: Daleen Squibb MD, Maisie Fus Indication 1: LVl thrombus 429.79 Indication 2: Cerebrovascular Accident Lab Used: [facility] [place] Site: [place] INR POC 3.2  Dietary changes: no    Health status changes: no    Bleeding/hemorrhagic complications: no    Recent/future hospitalizations: no    Any changes in medication regimen? no    Recent/future dental: no  Any missed doses?: no       Is patient compliant with meds? yes       Allergies: 1)  ! * "citrus Foods"  Anticoagulation Management History:      The patient is taking warfarin and comes in today for a routine follow up visit.  Anticoagulation is being administered due to LV thrombus   /  CVA.  Positive risk factors for bleeding include history of CVA/TIA.  Negative risk factors for bleeding include an age less than 83 years old, no history of GI bleeding, and absence of serious comorbidities.  The bleeding index is 'intermediate risk'.  Positive CHADS2 values include History of HTN and Prior Stroke/CVA/TIA.  Negative CHADS2 values include History of CHF, Age > 71 years old, and History of Diabetes.  The start date was 09/13/2010.  Anticoagulation responsible provider: Daleen Squibb MD, Maisie Fus.  INR POC: 3.2.  Cuvette Lot#: [id number].    Anticoagulation Management Assessment/Plan:      The patient's current anticoagulation dose is Warfarin sodium 5 mg tabs: Take 1 tablet daily or as directed by coumadin clinic.  The target INR is 2.0-3.0.  The next INR is due 04/11/2011.  Anticoagulation instructions were given to patient.  Results were reviewed/authorized by Vashti Hey, RN.  He was notified by Vashti Hey RN.         Prior Anticoagulation Instructions: INR 3.4 Hold coumadin tonight then resume 5mg  once daily except 7.5mg  on Mondays and Fridays  Current Anticoagulation Instructions: INR 3.2 Decrease coumadin to  5mg  once daily except 7.5mg  on Mondays

## 2011-03-28 ENCOUNTER — Other Ambulatory Visit: Payer: Self-pay | Admitting: Cardiology

## 2011-03-28 LAB — HEPATIC FUNCTION PANEL
ALT: 27 U/L (ref 0–53)
AST: 22 U/L (ref 0–37)
Alkaline Phosphatase: 119 U/L — ABNORMAL HIGH (ref 39–117)
Bilirubin, Direct: 0.1 mg/dL (ref 0.0–0.3)
Indirect Bilirubin: 0.3 mg/dL (ref 0.0–0.9)
Total Bilirubin: 0.4 mg/dL (ref 0.3–1.2)

## 2011-03-28 LAB — LIPID PANEL
Cholesterol: 156 mg/dL (ref 0–200)
Total CHOL/HDL Ratio: 3.9 Ratio

## 2011-03-29 ENCOUNTER — Telehealth: Payer: Self-pay

## 2011-03-29 NOTE — Letter (Signed)
Summary: [facility]  [facility] SLEEP [facility] NEUROLOGY RECORDS   Imported By: Faythe Ghee 03/19/2011 13:47:02  _____________________________________________________________________  External Attachment:    Type:   Image     Comment:   External Document

## 2011-03-30 ENCOUNTER — Telehealth: Payer: Self-pay

## 2011-04-02 ENCOUNTER — Encounter: Payer: Self-pay | Admitting: Cardiology

## 2011-04-02 ENCOUNTER — Encounter: Payer: Self-pay | Admitting: Adult Health

## 2011-04-02 ENCOUNTER — Encounter (INDEPENDENT_AMBULATORY_CARE_PROVIDER_SITE_OTHER): Payer: Medicaid Other | Admitting: Adult Health

## 2011-04-03 ENCOUNTER — Ambulatory Visit (INDEPENDENT_AMBULATORY_CARE_PROVIDER_SITE_OTHER): Payer: Medicaid Other | Admitting: Cardiology

## 2011-04-03 ENCOUNTER — Encounter: Payer: Self-pay | Admitting: Cardiology

## 2011-04-03 VITALS — BP 106/66 | HR 61 | Ht 67.0 in | Wt 157.0 lb

## 2011-04-03 DIAGNOSIS — F17200 Nicotine dependence, unspecified, uncomplicated: Secondary | ICD-10-CM

## 2011-04-03 DIAGNOSIS — I6350 Cerebral infarction due to unspecified occlusion or stenosis of unspecified cerebral artery: Secondary | ICD-10-CM

## 2011-04-03 DIAGNOSIS — E782 Mixed hyperlipidemia: Secondary | ICD-10-CM

## 2011-04-03 DIAGNOSIS — I238 Other current complications following acute myocardial infarction: Secondary | ICD-10-CM

## 2011-04-03 DIAGNOSIS — I2510 Atherosclerotic heart disease of native coronary artery without angina pectoris: Secondary | ICD-10-CM

## 2011-04-03 NOTE — Assessment & Plan Note (Signed)
Continues on Coumadin, no active bleeding problems.

## 2011-04-03 NOTE — Assessment & Plan Note (Signed)
We continue to address smoking cessation.

## 2011-04-03 NOTE — Progress Notes (Signed)
Clinical Summary Steve Vang is a 45 y.o.male presenting back to the office today after being seen briefly by [name] yesterday to discuss his recent neurological evaluation by [name] and his head MRI. The patient wanted to discuss the results with me. I did review the neurological consultation note, as well as reports of followup studies including EMGs, RPR, vitamin B12, homocystine, and the head MRI from 20 March. Importantly, the MRI did not demonstrate evidence of acute ischemia. He did have moderate sized encephalomalacia likely representing sequela of his previous infarct, and also subcortical white matter changes related to small vessel ischemic disease in the setting of hypertension and diabetes.  The patient was under the impression that he had "enlarged blood vessels" in his head based of his discussion with [name]. I do not see this elucidated in his formal head MRI report however. I discussed with the patient and his wife the reported results of the head MRI, and at this point recommended that they continue regular followup with [name], and also continuing medical therapy.  Recent followup lab work showed total cholesterol 156, triglycerides 100, HDL 40, LDL 96, AST 22, ALT 27. Lipid numbers are improving. We discussed this today.  He denies any active angina or progressive shortness of breath. No palpitations or syncope.   No Known Allergies  Current outpatient prescriptions:aspirin 81 MG tablet, Take 81 mg by mouth daily.  , Disp: , Rfl: ;  escitalopram (LEXAPRO) 10 MG tablet, Take 10 mg by mouth daily.  , Disp: , Rfl: ;  lisinopril (PRINIVIL,ZESTRIL) 20 MG tablet, Take 20 mg by mouth daily.  , Disp: , Rfl: ;  metoprolol tartrate (LOPRESSOR) 25 MG tablet, Take 25 mg by mouth daily.  , Disp: , Rfl: ;  mirtazapine (REMERON) 15 MG tablet, Take 15 mg by mouth at bedtime.  , Disp: , Rfl:  nitroGLYCERIN (NITROSTAT) 0.4 MG SL tablet, Place 0.4 mg under the tongue every 5  (five) minutes as needed.  , Disp: , Rfl: ;  pravastatin (PRAVACHOL) 80 MG tablet, Take 80 mg by mouth daily.  , Disp: , Rfl: ;  warfarin (COUMADIN) 5 MG tablet, Take 5 mg by mouth daily.  , Disp: , Rfl:   Past Medical History  Diagnosis Date  . CAD (coronary artery disease)     Multivessel, LVEF 45-50%  . Cerebrovascular disease     Stroke 2004  . Hyperlipidemia   . Hypertension   . Left ventricular mural thrombus     Apical    Social History Mr. Wanzer reports that he has been smoking Cigarettes.  He has been smoking about 1.5 packs per day. He has never used smokeless tobacco. Mr. Fye reports that he does not drink alcohol.  Review of Systems No fevers, chills, or unusual weight change. No chest pain, progressive shortness of breath, cough, hemoptysis, or wheezing. No palpitations, dizziness, or syncope. No dysphasia or odynophagia. Stable appetite with no abdominal pain, melena, or hematochezia. No orthopnea, PND, or lower extremity edema. No focal motor weakness. Otherwise systems reviewed and negative except as already outlined.   Physical Examination Filed Vitals:   04/03/11 1259  BP: 106/66  Pulse: 61  Thin male in no acute distress. HEENT: Conjunctiva and lids normal, oropharynx with poor dentition. Neck: Supple, no carotid bruits or thyromegaly. Normal jugular venous pressure. Lungs: Clear to auscultation, nonlabored. Thorax: Well-healed midline sternal incision and upper abdominal keyhole incisions. Cardiac: Regular rate and rhythm, no ventricular heave, no S3 gallop,  soft systolic murmur. Abdomen: Soft, nontender, no hepatomegaly, bowel sounds present. No bruits. Skin: Warm and dry, scattered tattoos. Musculoskeletal: No gross deformities. Extremities: No pitting edema. Neuropsychiatric: Alert and oriented x3, affect grossly appropriate. No focal motor weakness, no nystagmus.   Problem List and Plan

## 2011-04-03 NOTE — Assessment & Plan Note (Signed)
No active angina, on medical therapy. Keep next scheduled routine visit.

## 2011-04-03 NOTE — Assessment & Plan Note (Signed)
Lipids are improving on statin therapy.

## 2011-04-03 NOTE — Assessment & Plan Note (Signed)
Previous history of stroke in 2004. He does have some gait problems as noted. He is now under the care of Dr.[place]oonquah from a neurological perspective and underwent recent testing, reviewed above. I discussed the reported results of the head MRI with the patient his wife. These changes are consistent with previous stroke, and also ischemic vascular disease. Importantly, no new stroke was noted. I do not see any description of "enlarged blood vessels."  At this point I would anticipate continued followup, and medical therapy.

## 2011-04-03 NOTE — Patient Instructions (Signed)
**Note De-Identified Nijae Doyel Obfuscation** Your physician recommends that you schedule a follow-up appointment in: as previously scheduled Your physician recommends that you continue on your current medications as directed. Please refer to the Current Medication list given to you today.

## 2011-04-11 ENCOUNTER — Encounter: Payer: Medicaid Other | Admitting: *Deleted

## 2011-04-16 ENCOUNTER — Ambulatory Visit (INDEPENDENT_AMBULATORY_CARE_PROVIDER_SITE_OTHER): Payer: Medicaid Other | Admitting: *Deleted

## 2011-04-16 DIAGNOSIS — I238 Other current complications following acute myocardial infarction: Secondary | ICD-10-CM

## 2011-04-16 DIAGNOSIS — Z7901 Long term (current) use of anticoagulants: Secondary | ICD-10-CM

## 2011-04-16 DIAGNOSIS — I6350 Cerebral infarction due to unspecified occlusion or stenosis of unspecified cerebral artery: Secondary | ICD-10-CM

## 2011-04-18 ENCOUNTER — Encounter: Payer: Self-pay | Admitting: Cardiology

## 2011-05-01 ENCOUNTER — Encounter: Payer: Self-pay | Admitting: Cardiology

## 2011-05-14 ENCOUNTER — Ambulatory Visit (INDEPENDENT_AMBULATORY_CARE_PROVIDER_SITE_OTHER): Payer: Medicaid Other | Admitting: *Deleted

## 2011-05-14 DIAGNOSIS — I238 Other current complications following acute myocardial infarction: Secondary | ICD-10-CM

## 2011-05-14 DIAGNOSIS — Z7901 Long term (current) use of anticoagulants: Secondary | ICD-10-CM

## 2011-05-14 DIAGNOSIS — I6350 Cerebral infarction due to unspecified occlusion or stenosis of unspecified cerebral artery: Secondary | ICD-10-CM

## 2011-05-23 ENCOUNTER — Emergency Department (HOSPITAL_COMMUNITY)
Admission: EM | Admit: 2011-05-23 | Discharge: 2011-05-23 | Disposition: A | Discharge status: Home / Self Care | Payer: Medicaid Other | Attending: Emergency Medicine | Admitting: Emergency Medicine

## 2011-05-23 ENCOUNTER — Emergency Department (HOSPITAL_COMMUNITY): Payer: Medicaid Other

## 2011-05-23 DIAGNOSIS — I252 Old myocardial infarction: Secondary | ICD-10-CM | POA: Insufficient documentation

## 2011-05-23 DIAGNOSIS — I10 Essential (primary) hypertension: Secondary | ICD-10-CM | POA: Insufficient documentation

## 2011-05-23 DIAGNOSIS — I2510 Atherosclerotic heart disease of native coronary artery without angina pectoris: Secondary | ICD-10-CM | POA: Insufficient documentation

## 2011-05-23 DIAGNOSIS — Z8673 Personal history of transient ischemic attack (TIA), and cerebral infarction without residual deficits: Secondary | ICD-10-CM | POA: Insufficient documentation

## 2011-05-23 DIAGNOSIS — Z7982 Long term (current) use of aspirin: Secondary | ICD-10-CM | POA: Insufficient documentation

## 2011-05-23 DIAGNOSIS — Z79899 Other long term (current) drug therapy: Secondary | ICD-10-CM | POA: Insufficient documentation

## 2011-05-23 DIAGNOSIS — M25539 Pain in unspecified wrist: Secondary | ICD-10-CM | POA: Insufficient documentation

## 2011-05-23 DIAGNOSIS — E7800 Pure hypercholesterolemia, unspecified: Secondary | ICD-10-CM | POA: Insufficient documentation

## 2011-05-23 DIAGNOSIS — Z7901 Long term (current) use of anticoagulants: Secondary | ICD-10-CM | POA: Insufficient documentation

## 2011-06-11 ENCOUNTER — Encounter: Payer: Medicaid Other | Admitting: *Deleted

## 2011-06-13 ENCOUNTER — Encounter: Payer: Self-pay | Admitting: Cardiology

## 2011-06-18 ENCOUNTER — Telehealth: Payer: Self-pay | Admitting: Cardiology

## 2011-06-18 NOTE — Telephone Encounter (Signed)
Patient has lost his medicaid / states that he can not afford Lipitor / wants to know if there is anything cheaper he can take / tg

## 2011-06-20 ENCOUNTER — Ambulatory Visit (INDEPENDENT_AMBULATORY_CARE_PROVIDER_SITE_OTHER): Payer: Medicaid Other | Admitting: *Deleted

## 2011-06-20 DIAGNOSIS — I238 Other current complications following acute myocardial infarction: Secondary | ICD-10-CM

## 2011-06-20 DIAGNOSIS — Z7901 Long term (current) use of anticoagulants: Secondary | ICD-10-CM

## 2011-06-20 DIAGNOSIS — I6350 Cerebral infarction due to unspecified occlusion or stenosis of unspecified cerebral artery: Secondary | ICD-10-CM

## 2011-06-21 MED ORDER — PRAVASTATIN SODIUM 20 MG PO TABS: 20.0000 mg | ORAL_TABLET | Freq: Every day | ORAL | Status: DC

## 2011-06-21 NOTE — Telephone Encounter (Signed)
Please start patient on Pravastatin 20mg  at bedtime. Its on [facility] $4 plan.

## 2011-06-21 NOTE — Telephone Encounter (Signed)
**Note De-Identified Dastan Krider Obfuscation** Addended by: Demetrios Loll on: 06/21/2011 10:56 AM   Modules accepted: Orders

## 2011-06-27 ENCOUNTER — Other Ambulatory Visit: Payer: Self-pay | Admitting: Cardiology

## 2011-06-28 ENCOUNTER — Other Ambulatory Visit: Payer: Self-pay

## 2011-06-28 MED ORDER — WARFARIN SODIUM 5 MG PO TABS: 5.0000 mg | ORAL_TABLET | Freq: Every day | ORAL | Status: DC

## 2011-07-04 ENCOUNTER — Other Ambulatory Visit: Payer: Self-pay | Admitting: *Deleted

## 2011-07-04 MED ORDER — WARFARIN SODIUM 5 MG PO TABS: ORAL_TABLET | ORAL | Status: DC

## 2011-07-04 MED ORDER — WARFARIN SODIUM 5 MG PO TABS: 5.0000 mg | ORAL_TABLET | Freq: Every day | ORAL | Status: DC

## 2011-07-11 ENCOUNTER — Ambulatory Visit (INDEPENDENT_AMBULATORY_CARE_PROVIDER_SITE_OTHER): Payer: Medicaid Other | Admitting: *Deleted

## 2011-07-11 DIAGNOSIS — I238 Other current complications following acute myocardial infarction: Secondary | ICD-10-CM

## 2011-07-11 DIAGNOSIS — Z7901 Long term (current) use of anticoagulants: Secondary | ICD-10-CM

## 2011-07-11 DIAGNOSIS — I6350 Cerebral infarction due to unspecified occlusion or stenosis of unspecified cerebral artery: Secondary | ICD-10-CM

## 2011-07-11 LAB — POCT INR: INR: 4.9

## 2011-07-23 ENCOUNTER — Ambulatory Visit (INDEPENDENT_AMBULATORY_CARE_PROVIDER_SITE_OTHER): Payer: Medicaid Other | Admitting: *Deleted

## 2011-07-23 DIAGNOSIS — I6350 Cerebral infarction due to unspecified occlusion or stenosis of unspecified cerebral artery: Secondary | ICD-10-CM

## 2011-07-23 DIAGNOSIS — I238 Other current complications following acute myocardial infarction: Secondary | ICD-10-CM

## 2011-07-23 DIAGNOSIS — Z7901 Long term (current) use of anticoagulants: Secondary | ICD-10-CM

## 2011-07-23 LAB — POCT INR: INR: 2

## 2011-08-20 ENCOUNTER — Ambulatory Visit (INDEPENDENT_AMBULATORY_CARE_PROVIDER_SITE_OTHER): Payer: Self-pay | Admitting: *Deleted

## 2011-08-20 DIAGNOSIS — I238 Other current complications following acute myocardial infarction: Secondary | ICD-10-CM

## 2011-08-20 DIAGNOSIS — I6350 Cerebral infarction due to unspecified occlusion or stenosis of unspecified cerebral artery: Secondary | ICD-10-CM

## 2011-08-20 DIAGNOSIS — Z7901 Long term (current) use of anticoagulants: Secondary | ICD-10-CM

## 2011-08-20 LAB — POCT INR: INR: 3.2

## 2011-09-17 ENCOUNTER — Ambulatory Visit (INDEPENDENT_AMBULATORY_CARE_PROVIDER_SITE_OTHER): Payer: Self-pay | Admitting: *Deleted

## 2011-09-17 DIAGNOSIS — Z7901 Long term (current) use of anticoagulants: Secondary | ICD-10-CM

## 2011-09-17 DIAGNOSIS — I6350 Cerebral infarction due to unspecified occlusion or stenosis of unspecified cerebral artery: Secondary | ICD-10-CM

## 2011-09-17 DIAGNOSIS — I238 Other current complications following acute myocardial infarction: Secondary | ICD-10-CM

## 2011-09-17 LAB — POCT INR: INR: 2.5

## 2011-10-15 ENCOUNTER — Ambulatory Visit (INDEPENDENT_AMBULATORY_CARE_PROVIDER_SITE_OTHER): Payer: Self-pay | Admitting: *Deleted

## 2011-10-15 DIAGNOSIS — I6350 Cerebral infarction due to unspecified occlusion or stenosis of unspecified cerebral artery: Secondary | ICD-10-CM

## 2011-10-15 DIAGNOSIS — I238 Other current complications following acute myocardial infarction: Secondary | ICD-10-CM

## 2011-10-15 DIAGNOSIS — Z7901 Long term (current) use of anticoagulants: Secondary | ICD-10-CM

## 2011-10-15 LAB — POCT INR: INR: 1.8

## 2011-11-05 ENCOUNTER — Ambulatory Visit (INDEPENDENT_AMBULATORY_CARE_PROVIDER_SITE_OTHER): Payer: Self-pay | Admitting: *Deleted

## 2011-11-05 DIAGNOSIS — Z7901 Long term (current) use of anticoagulants: Secondary | ICD-10-CM

## 2011-11-05 DIAGNOSIS — I238 Other current complications following acute myocardial infarction: Secondary | ICD-10-CM

## 2011-11-05 DIAGNOSIS — I6350 Cerebral infarction due to unspecified occlusion or stenosis of unspecified cerebral artery: Secondary | ICD-10-CM

## 2011-11-05 LAB — POCT INR: INR: 1.9

## 2011-11-26 ENCOUNTER — Encounter: Payer: Self-pay | Admitting: *Deleted

## 2011-12-03 ENCOUNTER — Ambulatory Visit (INDEPENDENT_AMBULATORY_CARE_PROVIDER_SITE_OTHER): Payer: Self-pay | Admitting: *Deleted

## 2011-12-03 DIAGNOSIS — I238 Other current complications following acute myocardial infarction: Secondary | ICD-10-CM

## 2011-12-03 DIAGNOSIS — I6350 Cerebral infarction due to unspecified occlusion or stenosis of unspecified cerebral artery: Secondary | ICD-10-CM

## 2011-12-03 DIAGNOSIS — Z7901 Long term (current) use of anticoagulants: Secondary | ICD-10-CM

## 2011-12-12 ENCOUNTER — Other Ambulatory Visit: Payer: Self-pay | Admitting: Adult Health

## 2011-12-12 NOTE — Telephone Encounter (Signed)
Needs refill also on Metoprolol 25mg  sent to [facility] tg

## 2011-12-13 ENCOUNTER — Other Ambulatory Visit: Payer: Self-pay | Admitting: Cardiology

## 2011-12-31 ENCOUNTER — Ambulatory Visit (INDEPENDENT_AMBULATORY_CARE_PROVIDER_SITE_OTHER): Payer: Self-pay | Admitting: *Deleted

## 2011-12-31 DIAGNOSIS — Z7901 Long term (current) use of anticoagulants: Secondary | ICD-10-CM

## 2011-12-31 DIAGNOSIS — I238 Other current complications following acute myocardial infarction: Secondary | ICD-10-CM

## 2011-12-31 DIAGNOSIS — I6350 Cerebral infarction due to unspecified occlusion or stenosis of unspecified cerebral artery: Secondary | ICD-10-CM

## 2012-01-04 ENCOUNTER — Other Ambulatory Visit: Payer: Self-pay | Admitting: Cardiology

## 2012-01-04 MED ORDER — WARFARIN SODIUM 5 MG PO TABS: ORAL_TABLET | ORAL | Status: DC

## 2012-01-04 NOTE — Telephone Encounter (Signed)
PT IS OUT COMPLETELY NEEDS FILLED FOR THE WEEKEND.

## 2012-01-28 ENCOUNTER — Encounter: Payer: Self-pay | Admitting: Adult Health

## 2012-01-28 ENCOUNTER — Ambulatory Visit (INDEPENDENT_AMBULATORY_CARE_PROVIDER_SITE_OTHER): Payer: Self-pay | Admitting: Adult Health

## 2012-01-28 ENCOUNTER — Ambulatory Visit (INDEPENDENT_AMBULATORY_CARE_PROVIDER_SITE_OTHER): Payer: Self-pay | Admitting: *Deleted

## 2012-01-28 DIAGNOSIS — I238 Other current complications following acute myocardial infarction: Secondary | ICD-10-CM

## 2012-01-28 DIAGNOSIS — I2510 Atherosclerotic heart disease of native coronary artery without angina pectoris: Secondary | ICD-10-CM

## 2012-01-28 DIAGNOSIS — I10 Essential (primary) hypertension: Secondary | ICD-10-CM

## 2012-01-28 DIAGNOSIS — Z7901 Long term (current) use of anticoagulants: Secondary | ICD-10-CM

## 2012-01-28 DIAGNOSIS — I6350 Cerebral infarction due to unspecified occlusion or stenosis of unspecified cerebral artery: Secondary | ICD-10-CM

## 2012-01-28 LAB — POCT INR: INR: 1.8

## 2012-01-28 NOTE — Assessment & Plan Note (Signed)
Currently well-controlled. No medication changes 

## 2012-01-28 NOTE — Assessment & Plan Note (Signed)
He is without cardiac complaint. I will not make any changes on his medications. I have advised him to stop smoking and spend his money on the medications to help him, instead of cigarettes. He borrows money to pay for his medications now because he lost his medicaid. He verbalizes understanding. Will see him in 6 months unless he becomes symptomatic.

## 2012-01-28 NOTE — Patient Instructions (Signed)
Your physician recommends that you schedule a follow-up appointment in: 6 months  

## 2012-01-28 NOTE — Progress Notes (Signed)
   HPI: Mr. Drinkard is a 46 y/o patient of Dr. Diona Browner we are following for ongoing assessment and treatment of CAD, with history of CABG, hypertension, CVA, and history of depression and ongoing tobacco abuse. He comes today with tingling in his legs to his feet. 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 He gets his medications from [facility] $4 plan. Unfortunately, he continues to smoke. He remains on coumadin.He has had no complaints of chest pain, has had some DOE, but otherwise doing "about the same."  No Known Allergies  Current Outpatient Prescriptions  Medication Sig Dispense Refill  . aspirin 81 MG tablet Take 81 mg by mouth daily.        Marland Kitchen escitalopram (LEXAPRO) 10 MG tablet Take 10 mg by mouth daily.        Marland Kitchen lisinopril (PRINIVIL,ZESTRIL) 20 MG tablet TAKE ONE TABLET BY MOUTH EVERY DAY  90 tablet  3  . metoprolol tartrate (LOPRESSOR) 25 MG tablet TAKE ONE TABLET BY MOUTH EVERY DAY  90 tablet  3  . nitroGLYCERIN (NITROSTAT) 0.4 MG SL tablet Place 0.4 mg under the tongue every 5 (five) minutes as needed.        . pravastatin (PRAVACHOL) 20 MG tablet Take 1 tablet (20 mg total) by mouth at bedtime.  30 tablet  6  . warfarin (COUMADIN) 5 MG tablet 1 tablet daily except 1 1/2 tablets on Mondays and Thursdays  45 tablet  3    Past Medical History  Diagnosis Date  . CAD (coronary artery disease)     Multivessel, LVEF 45-50%  . Cerebrovascular disease     Stroke 2004  . Hyperlipidemia   . Hypertension   . Left ventricular mural thrombus     Apical    Past Surgical History  Procedure Date  . Coronary artery bypass graft     DOR anterior ventricular restoration surgery 8/06, [facility] [place] to first diagonal , SVG to PLB, SVG to RVE branch of nondominant RCA  . Eye surgery     ZOX:WRUEAV of systems complete and found to be negative unless listed above General: Well developed, well nourished, in no  acute distress Head: Eyes PERRLA, No xanthomas.   Normal cephalic and atramatic  Lungs: Clear bilaterally to auscultation and percussion. Heart: HRRR S1 S2, 1/6 systolic murmur  Pulses are 2+ & equal.            No carotid bruit. No JVD.  No abdominal bruits. No femoral bruits. Abdomen: Bowel sounds are positive, abdomen soft and non-tender without masses or                  Hernia's noted. Msk:  Back normal, normal gait. Normal strength and tone for age. Extremities: No clubbing, cyanosis or edema.  DP +1 Neuro: Alert and oriented X 3. Psych: Flat affect, responds appropriately PHYSICAL EXAM BP 127/89  Pulse 66  Ht 5\' 7"  (1.702 m)  Wt 168 lb (76.204 kg)  BMI 26.31 kg/m2    ASSESSMENT AND PLAN

## 2012-01-28 NOTE — Assessment & Plan Note (Signed)
He continues on coumadin with INR level checked on this office visit at our coumadin clinic. He will continue to follow this regimen.

## 2012-02-18 ENCOUNTER — Encounter: Payer: Self-pay | Admitting: *Deleted

## 2012-02-20 ENCOUNTER — Ambulatory Visit (INDEPENDENT_AMBULATORY_CARE_PROVIDER_SITE_OTHER): Payer: Self-pay | Admitting: *Deleted

## 2012-02-20 DIAGNOSIS — I238 Other current complications following acute myocardial infarction: Secondary | ICD-10-CM

## 2012-02-20 DIAGNOSIS — I6350 Cerebral infarction due to unspecified occlusion or stenosis of unspecified cerebral artery: Secondary | ICD-10-CM

## 2012-02-20 DIAGNOSIS — Z7901 Long term (current) use of anticoagulants: Secondary | ICD-10-CM

## 2012-02-20 LAB — POCT INR: INR: 3.6

## 2012-03-12 ENCOUNTER — Ambulatory Visit (INDEPENDENT_AMBULATORY_CARE_PROVIDER_SITE_OTHER): Payer: Self-pay | Admitting: *Deleted

## 2012-03-12 DIAGNOSIS — Z7901 Long term (current) use of anticoagulants: Secondary | ICD-10-CM

## 2012-03-12 DIAGNOSIS — I238 Other current complications following acute myocardial infarction: Secondary | ICD-10-CM

## 2012-03-12 DIAGNOSIS — I6350 Cerebral infarction due to unspecified occlusion or stenosis of unspecified cerebral artery: Secondary | ICD-10-CM

## 2012-03-12 DIAGNOSIS — R0989 Other specified symptoms and signs involving the circulatory and respiratory systems: Secondary | ICD-10-CM

## 2012-03-19 ENCOUNTER — Encounter: Payer: Self-pay | Admitting: *Deleted

## 2012-03-20 ENCOUNTER — Other Ambulatory Visit: Payer: Self-pay | Admitting: Adult Health

## 2012-03-26 ENCOUNTER — Ambulatory Visit (INDEPENDENT_AMBULATORY_CARE_PROVIDER_SITE_OTHER): Payer: Self-pay | Admitting: *Deleted

## 2012-03-26 DIAGNOSIS — Z7901 Long term (current) use of anticoagulants: Secondary | ICD-10-CM

## 2012-03-26 DIAGNOSIS — I6350 Cerebral infarction due to unspecified occlusion or stenosis of unspecified cerebral artery: Secondary | ICD-10-CM

## 2012-03-26 DIAGNOSIS — I238 Other current complications following acute myocardial infarction: Secondary | ICD-10-CM

## 2012-03-26 LAB — POCT INR: INR: 3

## 2012-04-23 ENCOUNTER — Ambulatory Visit (INDEPENDENT_AMBULATORY_CARE_PROVIDER_SITE_OTHER): Payer: Self-pay | Admitting: *Deleted

## 2012-04-23 DIAGNOSIS — I6350 Cerebral infarction due to unspecified occlusion or stenosis of unspecified cerebral artery: Secondary | ICD-10-CM

## 2012-04-23 DIAGNOSIS — I238 Other current complications following acute myocardial infarction: Secondary | ICD-10-CM

## 2012-04-23 DIAGNOSIS — Z7901 Long term (current) use of anticoagulants: Secondary | ICD-10-CM

## 2012-04-23 LAB — POCT INR: INR: 2

## 2012-05-21 ENCOUNTER — Ambulatory Visit (INDEPENDENT_AMBULATORY_CARE_PROVIDER_SITE_OTHER): Payer: Self-pay | Admitting: *Deleted

## 2012-05-21 DIAGNOSIS — Z7901 Long term (current) use of anticoagulants: Secondary | ICD-10-CM

## 2012-05-21 DIAGNOSIS — I238 Other current complications following acute myocardial infarction: Secondary | ICD-10-CM

## 2012-05-21 DIAGNOSIS — I6350 Cerebral infarction due to unspecified occlusion or stenosis of unspecified cerebral artery: Secondary | ICD-10-CM

## 2012-05-21 LAB — POCT INR: INR: 3.9

## 2012-06-11 ENCOUNTER — Ambulatory Visit (INDEPENDENT_AMBULATORY_CARE_PROVIDER_SITE_OTHER): Payer: Self-pay | Admitting: *Deleted

## 2012-06-11 DIAGNOSIS — I6350 Cerebral infarction due to unspecified occlusion or stenosis of unspecified cerebral artery: Secondary | ICD-10-CM

## 2012-06-11 DIAGNOSIS — Z7901 Long term (current) use of anticoagulants: Secondary | ICD-10-CM

## 2012-06-11 DIAGNOSIS — I238 Other current complications following acute myocardial infarction: Secondary | ICD-10-CM

## 2012-06-23 ENCOUNTER — Ambulatory Visit (INDEPENDENT_AMBULATORY_CARE_PROVIDER_SITE_OTHER): Payer: Self-pay | Admitting: *Deleted

## 2012-06-23 DIAGNOSIS — I238 Other current complications following acute myocardial infarction: Secondary | ICD-10-CM

## 2012-06-23 DIAGNOSIS — Z7901 Long term (current) use of anticoagulants: Secondary | ICD-10-CM

## 2012-06-23 DIAGNOSIS — I6350 Cerebral infarction due to unspecified occlusion or stenosis of unspecified cerebral artery: Secondary | ICD-10-CM

## 2012-06-27 ENCOUNTER — Ambulatory Visit (INDEPENDENT_AMBULATORY_CARE_PROVIDER_SITE_OTHER): Payer: Self-pay | Admitting: Adult Health

## 2012-06-27 ENCOUNTER — Encounter: Payer: Self-pay | Admitting: Adult Health

## 2012-06-27 VITALS — BP 139/86 | HR 80 | Resp 16 | Ht 67.0 in | Wt 159.0 lb

## 2012-06-27 DIAGNOSIS — I2510 Atherosclerotic heart disease of native coronary artery without angina pectoris: Secondary | ICD-10-CM

## 2012-06-27 DIAGNOSIS — I10 Essential (primary) hypertension: Secondary | ICD-10-CM

## 2012-06-27 DIAGNOSIS — F17200 Nicotine dependence, unspecified, uncomplicated: Secondary | ICD-10-CM

## 2012-06-27 DIAGNOSIS — I238 Other current complications following acute myocardial infarction: Secondary | ICD-10-CM

## 2012-06-27 MED ORDER — NITROGLYCERIN 0.4 MG SL SUBL: 0.4000 mg | SUBLINGUAL_TABLET | SUBLINGUAL | Status: DC | PRN

## 2012-06-27 MED ORDER — WARFARIN SODIUM 5 MG PO TABS: ORAL_TABLET | ORAL | Status: DC

## 2012-06-27 MED ORDER — LISINOPRIL 20 MG PO TABS: 20.0000 mg | ORAL_TABLET | Freq: Every day | ORAL | Status: DC

## 2012-06-27 MED ORDER — PRAVASTATIN SODIUM 20 MG PO TABS: 20.0000 mg | ORAL_TABLET | Freq: Every day | ORAL | Status: DC

## 2012-06-27 MED ORDER — METOPROLOL TARTRATE 25 MG PO TABS: 25.0000 mg | ORAL_TABLET | Freq: Two times a day (BID) | ORAL | Status: DC

## 2012-06-27 NOTE — Assessment & Plan Note (Signed)
He remains on coumadin. I will check CBC and continue INR checks.

## 2012-06-27 NOTE — Assessment & Plan Note (Signed)
I have spoken to him again about smoking cessation and his known CAD. He appears sheepish but has no plans to quit at this time.

## 2012-06-27 NOTE — Progress Notes (Signed)
HPI: [name] is a 46 y/o patient of Dr. Diona Browner we are following for ongoing assessment and treatment of CAD, with history of CABG, hypertension, CVA, and history of depression and ongoing tobacco abuse. He comes today with tingling in his legs to his feet. 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 He gets his medications from [facility] $4 plan. Unfortunately, he continues to smoke. He remains on coumadin.He has had no complaints of chest pain, has had some DOE, and some right shoulder pain which he attributes to helping a friend but in a new clutch,  but otherwise doing "about the same."  No Known Allergies  Current Outpatient Prescriptions  Medication Sig Dispense Refill  . aspirin 81 MG tablet Take 81 mg by mouth daily.        Marland Kitchen escitalopram (LEXAPRO) 10 MG tablet Take 20 mg by mouth daily.       Marland Kitchen lisinopril (PRINIVIL,ZESTRIL) 20 MG tablet Take 1 tablet (20 mg total) by mouth daily.  90 tablet  3  . metoprolol tartrate (LOPRESSOR) 25 MG tablet Take 1 tablet (25 mg total) by mouth 2 (two) times daily.  180 tablet  3  . nitroGLYCERIN (NITROSTAT) 0.4 MG SL tablet Place 1 tablet (0.4 mg total) under the tongue every 5 (five) minutes as needed.  100 tablet  3  . pravastatin (PRAVACHOL) 20 MG tablet Take 1 tablet (20 mg total) by mouth daily.  90 tablet  3  . warfarin (COUMADIN) 5 MG tablet 1 tablet daily except 1 1/2 tablets on Mondays and Thursdays  45 tablet  3  . DISCONTD: lisinopril (PRINIVIL,ZESTRIL) 20 MG tablet TAKE ONE TABLET BY MOUTH EVERY DAY  90 tablet  3  . DISCONTD: metoprolol tartrate (LOPRESSOR) 25 MG tablet TAKE ONE TABLET BY MOUTH EVERY DAY  90 tablet  3  . DISCONTD: nitroGLYCERIN (NITROSTAT) 0.4 MG SL tablet Place 0.4 mg under the tongue every 5 (five) minutes as needed.        Marland Kitchen DISCONTD: pravastatin (PRAVACHOL) 20 MG tablet TAKE ONE TABLET BY MOUTH EVERY DAY AT BEDTIME  30 tablet  6  . DISCONTD:  warfarin (COUMADIN) 5 MG tablet 1 tablet daily except 1 1/2 tablets on Mondays and Thursdays  45 tablet  3    Past Medical History  Diagnosis Date  . CAD (coronary artery disease)     Multivessel, LVEF 45-50%  . Cerebrovascular disease     Stroke 2004  . Hyperlipidemia   . Hypertension   . Left ventricular mural thrombus     Apical    Past Surgical History  Procedure Date  . Coronary artery bypass graft     DOR anterior ventricular restoration surgery 8/06, [facility] [place] to first diagonal , SVG to PLB, SVG to RVE branch of nondominant RCA  . Eye surgery     ZOX:WRUEAV of systems complete and found to be negative unless listed above General: Well developed, well nourished, in no acute distress Head: Eyes PERRLA, No xanthomas.   Normal cephalic and atramatic  Lungs: Clear bilaterally to auscultation and percussion. Heart: HRRR S1 S2, 1/6 systolic murmur  Pulses are 2+ & equal.            No carotid bruit. No JVD.  No abdominal bruits. No femoral bruits. Abdomen: Bowel sounds are positive, abdomen soft and non-tender without masses or  Hernia's noted. Msk:  Back normal, normal gait. Normal strength and tone for age. Extremities: No clubbing, cyanosis or edema.  DP +1 Neuro: Alert and oriented X 3. Psych: Flat affect, responds appropriately PHYSICAL EXAM BP 139/86  Pulse 80  Resp 16  Ht 5\' 7"  (1.702 m)  Wt 159 lb (72.122 kg)  BMI 24.90 kg/m2    ASSESSMENT AND PLAN

## 2012-06-27 NOTE — Assessment & Plan Note (Signed)
He is without complaint and remains active. I will continue risk factor modification. He will have fasting lipids and LFT.s completed.

## 2012-06-27 NOTE — Assessment & Plan Note (Signed)
Currently well controlled. I will check a BMET for kidney fx on ACE. Refills are provided on all of his medications.

## 2012-07-14 ENCOUNTER — Ambulatory Visit (INDEPENDENT_AMBULATORY_CARE_PROVIDER_SITE_OTHER): Payer: Self-pay | Admitting: *Deleted

## 2012-07-14 DIAGNOSIS — Z7901 Long term (current) use of anticoagulants: Secondary | ICD-10-CM

## 2012-07-14 DIAGNOSIS — I238 Other current complications following acute myocardial infarction: Secondary | ICD-10-CM

## 2012-07-14 DIAGNOSIS — I6350 Cerebral infarction due to unspecified occlusion or stenosis of unspecified cerebral artery: Secondary | ICD-10-CM

## 2012-07-14 LAB — POCT INR: INR: 1.8

## 2012-08-06 ENCOUNTER — Ambulatory Visit (INDEPENDENT_AMBULATORY_CARE_PROVIDER_SITE_OTHER): Payer: Self-pay | Admitting: *Deleted

## 2012-08-06 DIAGNOSIS — Z7901 Long term (current) use of anticoagulants: Secondary | ICD-10-CM

## 2012-08-06 DIAGNOSIS — I6350 Cerebral infarction due to unspecified occlusion or stenosis of unspecified cerebral artery: Secondary | ICD-10-CM

## 2012-08-06 DIAGNOSIS — I238 Other current complications following acute myocardial infarction: Secondary | ICD-10-CM

## 2012-08-06 LAB — POCT INR: INR: 2.1

## 2012-09-03 ENCOUNTER — Ambulatory Visit (INDEPENDENT_AMBULATORY_CARE_PROVIDER_SITE_OTHER): Payer: Self-pay | Admitting: *Deleted

## 2012-09-03 DIAGNOSIS — I6350 Cerebral infarction due to unspecified occlusion or stenosis of unspecified cerebral artery: Secondary | ICD-10-CM

## 2012-09-03 DIAGNOSIS — Z7901 Long term (current) use of anticoagulants: Secondary | ICD-10-CM

## 2012-09-03 DIAGNOSIS — I238 Other current complications following acute myocardial infarction: Secondary | ICD-10-CM

## 2012-09-03 LAB — POCT INR: INR: 4

## 2012-09-22 ENCOUNTER — Ambulatory Visit (INDEPENDENT_AMBULATORY_CARE_PROVIDER_SITE_OTHER): Payer: Self-pay | Admitting: *Deleted

## 2012-09-22 DIAGNOSIS — I238 Other current complications following acute myocardial infarction: Secondary | ICD-10-CM

## 2012-09-22 DIAGNOSIS — I6350 Cerebral infarction due to unspecified occlusion or stenosis of unspecified cerebral artery: Secondary | ICD-10-CM

## 2012-09-22 DIAGNOSIS — Z7901 Long term (current) use of anticoagulants: Secondary | ICD-10-CM

## 2012-09-22 LAB — POCT INR: INR: 1.9

## 2012-10-16 ENCOUNTER — Ambulatory Visit (INDEPENDENT_AMBULATORY_CARE_PROVIDER_SITE_OTHER): Payer: Self-pay | Admitting: *Deleted

## 2012-10-16 DIAGNOSIS — I238 Other current complications following acute myocardial infarction: Secondary | ICD-10-CM

## 2012-10-16 DIAGNOSIS — Z7901 Long term (current) use of anticoagulants: Secondary | ICD-10-CM

## 2012-10-16 DIAGNOSIS — I6350 Cerebral infarction due to unspecified occlusion or stenosis of unspecified cerebral artery: Secondary | ICD-10-CM

## 2012-10-16 LAB — POCT INR: INR: 3.2

## 2012-11-06 ENCOUNTER — Ambulatory Visit (INDEPENDENT_AMBULATORY_CARE_PROVIDER_SITE_OTHER): Payer: Self-pay | Admitting: *Deleted

## 2012-11-06 DIAGNOSIS — Z7901 Long term (current) use of anticoagulants: Secondary | ICD-10-CM

## 2012-11-06 DIAGNOSIS — I238 Other current complications following acute myocardial infarction: Secondary | ICD-10-CM

## 2012-11-06 DIAGNOSIS — I6350 Cerebral infarction due to unspecified occlusion or stenosis of unspecified cerebral artery: Secondary | ICD-10-CM

## 2012-11-06 LAB — POCT INR: INR: 2.2

## 2012-12-02 ENCOUNTER — Other Ambulatory Visit: Payer: Self-pay | Admitting: Cardiology

## 2012-12-02 MED ORDER — WARFARIN SODIUM 5 MG PO TABS: ORAL_TABLET | ORAL | Status: DC

## 2012-12-04 ENCOUNTER — Telehealth: Payer: Self-pay | Admitting: Cardiology

## 2012-12-04 ENCOUNTER — Ambulatory Visit (INDEPENDENT_AMBULATORY_CARE_PROVIDER_SITE_OTHER): Payer: Self-pay | Admitting: *Deleted

## 2012-12-04 DIAGNOSIS — Z7901 Long term (current) use of anticoagulants: Secondary | ICD-10-CM

## 2012-12-04 DIAGNOSIS — I6350 Cerebral infarction due to unspecified occlusion or stenosis of unspecified cerebral artery: Secondary | ICD-10-CM

## 2012-12-04 DIAGNOSIS — I238 Other current complications following acute myocardial infarction: Secondary | ICD-10-CM

## 2012-12-04 LAB — POCT INR: INR: 2

## 2012-12-04 MED ORDER — PRAVASTATIN SODIUM 20 MG PO TABS: 20.0000 mg | ORAL_TABLET | Freq: Every day | ORAL | Status: DC

## 2012-12-04 NOTE — Telephone Encounter (Signed)
rx sent to pharmacy by e-script per pt compliance noted in chart 

## 2012-12-04 NOTE — Telephone Encounter (Signed)
Pt needs refill on Pravastatin called to [facility] / tg

## 2013-01-01 ENCOUNTER — Ambulatory Visit (INDEPENDENT_AMBULATORY_CARE_PROVIDER_SITE_OTHER): Payer: Self-pay | Admitting: *Deleted

## 2013-01-01 DIAGNOSIS — I238 Other current complications following acute myocardial infarction: Secondary | ICD-10-CM

## 2013-01-01 DIAGNOSIS — I6350 Cerebral infarction due to unspecified occlusion or stenosis of unspecified cerebral artery: Secondary | ICD-10-CM

## 2013-01-01 DIAGNOSIS — Z7901 Long term (current) use of anticoagulants: Secondary | ICD-10-CM

## 2013-01-22 ENCOUNTER — Telehealth: Payer: Self-pay | Admitting: Cardiology

## 2013-01-22 ENCOUNTER — Ambulatory Visit (INDEPENDENT_AMBULATORY_CARE_PROVIDER_SITE_OTHER): Payer: Self-pay | Admitting: *Deleted

## 2013-01-22 DIAGNOSIS — I238 Other current complications following acute myocardial infarction: Secondary | ICD-10-CM

## 2013-01-22 DIAGNOSIS — I6350 Cerebral infarction due to unspecified occlusion or stenosis of unspecified cerebral artery: Secondary | ICD-10-CM

## 2013-01-22 DIAGNOSIS — Z7901 Long term (current) use of anticoagulants: Secondary | ICD-10-CM

## 2013-01-22 LAB — POCT INR: INR: 2.4

## 2013-01-22 NOTE — Telephone Encounter (Signed)
If simvastatin is on the $4 plan, would prefer that we go to that instead. Consider simvastatin 20 mg daily.

## 2013-01-22 NOTE — Telephone Encounter (Signed)
Patient states that Pravastatin is no longer on the $4 plan at [facility].  Wants to know if he can be switched to Lovastatin 10 or 20mg . / tgs

## 2013-01-23 MED ORDER — SIMVASTATIN 20 MG PO TABS: 20.0000 mg | ORAL_TABLET | Freq: Every day | ORAL | Status: DC

## 2013-01-23 NOTE — Telephone Encounter (Signed)
Pt agreed to switch to simvastatin 20mg  one tablet daily, sent new medication via escribe to [facility] in [place]

## 2013-01-26 ENCOUNTER — Telehealth: Payer: Self-pay | Admitting: Cardiology

## 2013-01-26 DIAGNOSIS — E782 Mixed hyperlipidemia: Secondary | ICD-10-CM

## 2013-01-26 DIAGNOSIS — I10 Essential (primary) hypertension: Secondary | ICD-10-CM

## 2013-01-26 MED ORDER — LOVASTATIN 20 MG PO TABS: 20.0000 mg | ORAL_TABLET | Freq: Every day | ORAL | Status: DC

## 2013-01-26 NOTE — Telephone Encounter (Signed)
Patient went to pick up medication and Simvastatin was more expensive than the Pravastatin.  The only meds on the $4 plan at [facility] is the Lovastatin.  States that if he can take that he will not be able to take any.  / tg

## 2013-01-26 NOTE — Telephone Encounter (Signed)
Spoke to pt to advise results/instructions. Pt understood. Sent new Rx out to [facility] in [place] Made changes in chart as noted Reminders placed in chart to mail letter/labs slips for pt to have blood drawn in 12 weeks

## 2013-01-26 NOTE — Telephone Encounter (Signed)
Then start Lovastatin 20 mg daily. Will need followup FLP and LFT in 12 weeks.

## 2013-02-11 ENCOUNTER — Ambulatory Visit (INDEPENDENT_AMBULATORY_CARE_PROVIDER_SITE_OTHER): Payer: Self-pay | Admitting: *Deleted

## 2013-02-11 DIAGNOSIS — I6350 Cerebral infarction due to unspecified occlusion or stenosis of unspecified cerebral artery: Secondary | ICD-10-CM

## 2013-02-11 DIAGNOSIS — Z7901 Long term (current) use of anticoagulants: Secondary | ICD-10-CM

## 2013-02-11 DIAGNOSIS — I238 Other current complications following acute myocardial infarction: Secondary | ICD-10-CM

## 2013-03-11 ENCOUNTER — Ambulatory Visit (INDEPENDENT_AMBULATORY_CARE_PROVIDER_SITE_OTHER): Payer: Self-pay | Admitting: *Deleted

## 2013-03-11 DIAGNOSIS — I6350 Cerebral infarction due to unspecified occlusion or stenosis of unspecified cerebral artery: Secondary | ICD-10-CM

## 2013-03-11 DIAGNOSIS — I238 Other current complications following acute myocardial infarction: Secondary | ICD-10-CM

## 2013-03-11 DIAGNOSIS — Z7901 Long term (current) use of anticoagulants: Secondary | ICD-10-CM

## 2013-03-11 LAB — POCT INR: INR: 2.7

## 2013-04-08 ENCOUNTER — Ambulatory Visit (INDEPENDENT_AMBULATORY_CARE_PROVIDER_SITE_OTHER): Payer: Self-pay | Admitting: *Deleted

## 2013-04-08 DIAGNOSIS — I6350 Cerebral infarction due to unspecified occlusion or stenosis of unspecified cerebral artery: Secondary | ICD-10-CM

## 2013-04-08 DIAGNOSIS — I238 Other current complications following acute myocardial infarction: Secondary | ICD-10-CM

## 2013-04-08 DIAGNOSIS — Z7901 Long term (current) use of anticoagulants: Secondary | ICD-10-CM

## 2013-04-08 LAB — POCT INR: INR: 2.1

## 2013-04-15 ENCOUNTER — Other Ambulatory Visit: Payer: Self-pay | Admitting: *Deleted

## 2013-04-15 ENCOUNTER — Encounter: Payer: Self-pay | Admitting: *Deleted

## 2013-04-15 DIAGNOSIS — E782 Mixed hyperlipidemia: Secondary | ICD-10-CM

## 2013-04-15 DIAGNOSIS — I10 Essential (primary) hypertension: Secondary | ICD-10-CM

## 2013-04-21 NOTE — Progress Notes (Signed)
Patient ID: Steve Vang, male   DOB: Jan 24, 1966, 47 y.o.   MRN: [id number] Cancelled appt

## 2013-04-24 ENCOUNTER — Encounter: Payer: Self-pay | Admitting: *Deleted

## 2013-04-24 ENCOUNTER — Other Ambulatory Visit: Payer: Self-pay | Admitting: *Deleted

## 2013-04-24 DIAGNOSIS — E782 Mixed hyperlipidemia: Secondary | ICD-10-CM

## 2013-05-20 ENCOUNTER — Ambulatory Visit (INDEPENDENT_AMBULATORY_CARE_PROVIDER_SITE_OTHER): Payer: Self-pay | Admitting: *Deleted

## 2013-05-20 DIAGNOSIS — Z7901 Long term (current) use of anticoagulants: Secondary | ICD-10-CM

## 2013-05-20 DIAGNOSIS — I6350 Cerebral infarction due to unspecified occlusion or stenosis of unspecified cerebral artery: Secondary | ICD-10-CM

## 2013-05-20 DIAGNOSIS — I238 Other current complications following acute myocardial infarction: Secondary | ICD-10-CM

## 2013-05-20 LAB — POCT INR: INR: 2.5

## 2013-06-10 ENCOUNTER — Emergency Department (HOSPITAL_COMMUNITY)
Admission: EM | Admit: 2013-06-10 | Discharge: 2013-06-10 | Disposition: A | Discharge status: Home / Self Care | Payer: Self-pay | Attending: Emergency Medicine | Admitting: Emergency Medicine

## 2013-06-10 ENCOUNTER — Encounter (HOSPITAL_COMMUNITY): Payer: Self-pay | Admitting: Emergency Medicine

## 2013-06-10 DIAGNOSIS — L0201 Cutaneous abscess of face: Secondary | ICD-10-CM | POA: Insufficient documentation

## 2013-06-10 DIAGNOSIS — R220 Localized swelling, mass and lump, head: Secondary | ICD-10-CM | POA: Insufficient documentation

## 2013-06-10 DIAGNOSIS — Z8673 Personal history of transient ischemic attack (TIA), and cerebral infarction without residual deficits: Secondary | ICD-10-CM | POA: Insufficient documentation

## 2013-06-10 DIAGNOSIS — F329 Major depressive disorder, single episode, unspecified: Secondary | ICD-10-CM | POA: Insufficient documentation

## 2013-06-10 DIAGNOSIS — Z7982 Long term (current) use of aspirin: Secondary | ICD-10-CM | POA: Insufficient documentation

## 2013-06-10 DIAGNOSIS — I252 Old myocardial infarction: Secondary | ICD-10-CM | POA: Insufficient documentation

## 2013-06-10 DIAGNOSIS — I10 Essential (primary) hypertension: Secondary | ICD-10-CM | POA: Insufficient documentation

## 2013-06-10 DIAGNOSIS — L03211 Cellulitis of face: Secondary | ICD-10-CM | POA: Insufficient documentation

## 2013-06-10 DIAGNOSIS — F17200 Nicotine dependence, unspecified, uncomplicated: Secondary | ICD-10-CM | POA: Insufficient documentation

## 2013-06-10 DIAGNOSIS — F3289 Other specified depressive episodes: Secondary | ICD-10-CM | POA: Insufficient documentation

## 2013-06-10 DIAGNOSIS — I2510 Atherosclerotic heart disease of native coronary artery without angina pectoris: Secondary | ICD-10-CM | POA: Insufficient documentation

## 2013-06-10 DIAGNOSIS — Z79899 Other long term (current) drug therapy: Secondary | ICD-10-CM | POA: Insufficient documentation

## 2013-06-10 DIAGNOSIS — E785 Hyperlipidemia, unspecified: Secondary | ICD-10-CM | POA: Insufficient documentation

## 2013-06-10 HISTORY — DX: Depression, unspecified: F32.A

## 2013-06-10 HISTORY — DX: Major depressive disorder, single episode, unspecified: F32.9

## 2013-06-10 MED ORDER — SULFAMETHOXAZOLE-TMP DS 800-160 MG PO TABS
1.0000 | ORAL_TABLET | Freq: Once | ORAL | Status: AC
  Administered 2013-06-10: 1 via ORAL
  Filled 2013-06-10: qty 1

## 2013-06-10 MED ORDER — SULFAMETHOXAZOLE-TRIMETHOPRIM 800-160 MG PO TABS: 1.0000 | ORAL_TABLET | Freq: Two times a day (BID) | ORAL | Status: DC

## 2013-06-10 MED ORDER — OXYCODONE-ACETAMINOPHEN 5-325 MG PO TABS: 1.0000 | ORAL_TABLET | ORAL | Status: DC | PRN

## 2013-06-10 MED ORDER — OXYCODONE-ACETAMINOPHEN 5-325 MG PO TABS
1.0000 | ORAL_TABLET | Freq: Once | ORAL | Status: AC
  Administered 2013-06-10: 1 via ORAL
  Filled 2013-06-10: qty 1

## 2013-06-10 NOTE — ED Notes (Signed)
Pt c/o abscess to right cheek x 5 days.

## 2013-06-10 NOTE — ED Provider Notes (Signed)
History     CSN: [id number]  Arrival date & time 06/10/13  1050   First MD Initiated Contact with Patient 06/10/13 1133      Chief Complaint  Patient presents with  . Abscess    (Consider location/radiation/quality/duration/timing/severity/associated sxs/prior treatment) HPI Comments: Steve Vang is a 46 y.o. male who presents to the Emergency Department complaining of swelling pain and mild drainage from right cheek.  States he noticed a red "bump" to his cheek 5 days ago that he thought was a pimple but states the swelling continued to worsen with increasing pain to the area.  He also reports that he has been squeezing on it.  Nothing has made the area better.  He denies fever, neck pain, difficulty swallowing, dental pain or sore throat.    The history is provided by the patient.    Past Medical History  Diagnosis Date  . CAD (coronary artery disease)     Multivessel, LVEF 45-50%  . Cerebrovascular disease     Stroke 2004  . Hyperlipidemia   . Hypertension   . Left ventricular mural thrombus     Apical  . Depression     Past Surgical History  Procedure Laterality Date  . Coronary artery bypass graft      DOR anterior ventricular restoration surgery 8/06, [facility] [place] to first diagonal , SVG to PLB, SVG to RVE branch of nondominant RCA  . Eye surgery      Family History  Problem Relation Age of Onset  . Hypertension Mother   . Diabetes Mother     History  Substance Use Topics  . Smoking status: Current Every Day Smoker -- 1.50 packs/day    Types: Cigarettes  . Smokeless tobacco: Never Used  . Alcohol Use: No      Review of Systems  Constitutional: Negative for fever and chills.  HENT: Positive for facial swelling. Negative for ear pain, congestion, neck pain, neck stiffness and ear discharge.   Respiratory: Negative for cough.   Gastrointestinal: Negative for nausea and vomiting.  Musculoskeletal: Negative for joint swelling and arthralgias.    Skin: Positive for color change.       Abscess   Neurological: Negative for dizziness, syncope, speech difficulty, weakness, numbness and headaches.  Hematological: Negative for adenopathy.  All other systems reviewed and are negative.    Allergies  Review of patient's allergies indicates no known allergies.  Home Medications   Current Outpatient Rx  Name  Route  Sig  Dispense  Refill  . aspirin 81 MG tablet   Oral   Take 81 mg by mouth daily.           . citalopram (CELEXA) 20 MG tablet   Oral   Take 20 mg by mouth daily.         Marland Kitchen lisinopril (PRINIVIL,ZESTRIL) 20 MG tablet   Oral   Take 1 tablet (20 mg total) by mouth daily.   90 tablet   3   . lovastatin (MEVACOR) 20 MG tablet   Oral   Take 1 tablet (20 mg total) by mouth at bedtime.   30 tablet   5   . metoprolol tartrate (LOPRESSOR) 25 MG tablet   Oral   Take 1 tablet (25 mg total) by mouth 2 (two) times daily.   180 tablet   3   . warfarin (COUMADIN) 5 MG tablet      1 tablet daily except 1 1/2 tablets on Mondays and Thursdays  45 tablet   3   . nitroGLYCERIN (NITROSTAT) 0.4 MG SL tablet   Sublingual   Place 1 tablet (0.4 mg total) under the tongue every 5 (five) minutes as needed.   100 tablet   3   . oxyCODONE-acetaminophen (PERCOCET/ROXICET) 5-325 MG per tablet   Oral   Take 1 tablet by mouth every 4 (four) hours as needed for pain.   20 tablet   0   . sulfamethoxazole-trimethoprim (SEPTRA DS) 800-160 MG per tablet   Oral   Take 1 tablet by mouth 2 (two) times daily.   28 tablet   0     BP 121/82  Pulse 68  Temp(Src) 99 F (37.2 C)  Resp 18  Ht 5\' 7"  (1.702 m)  Wt 161 lb 4 oz (73.143 kg)  BMI 25.25 kg/m2  SpO2 92%  Physical Exam  Nursing note and vitals reviewed. Constitutional: He is oriented to person, place, and time. He appears well-developed and well-nourished. No distress.  HENT:  Head: Atraumatic. No trismus in the jaw.    Right Ear: Tympanic membrane and ear  canal normal.  Left Ear: Tympanic membrane and ear canal normal.  Nose: Nose normal.  Mouth/Throat: Uvula is midline, oropharynx is clear and moist and mucous membranes are normal. No dental abscesses, edematous or dental caries.  Localized, golf ball size area of induration and mild erythema of the right cheek.  Abraded center with scant purulent drainage.  No red streaks, no dental pain or tenderness of the lower gums.  No trismus or sublingual abnml.    Neck: Normal range of motion, full passive range of motion without pain and phonation normal. No spinous process tenderness and no muscular tenderness present. No edema, no erythema and normal range of motion present.  Cardiovascular: Normal rate, regular rhythm and normal heart sounds.   Pulmonary/Chest: Effort normal and breath sounds normal. No respiratory distress.  Musculoskeletal: Normal range of motion.  Neurological: He is alert and oriented to person, place, and time. He exhibits normal muscle tone. Coordination normal.  Skin: Skin is warm and dry.  See HENT exam    ED Course  Procedures (including critical care time)  Labs Reviewed - No data to display No results found.   1. Abscess of face       MDM     Patient recommended to have incision and draiange performed, but refuses procedure at this time.  I have advised him of risks of infection spreading and possible sepsis . No significant cellulitis.   He prefers to try antibiotics first and warm compresses.  Agrees to return here in 1-2 days or sooner if needed.    Pt is non-toxic appearing, no airway compromise or trismus.  Stable for d/c.     Ladislav Caselli L. Trisha Mangle, PA-C 06/13/13 1727

## 2013-06-10 NOTE — ED Notes (Signed)
Pt presents with rt cheek abscess with significant edema noted at site. Pt reports squeezing the abscess yesterday and pulling a hair from open center of site. No drainage noted at this time. Pt states has had a headache since awakening this morning. Denies N/V and fever at this time. NAD noted.

## 2013-06-12 ENCOUNTER — Encounter (HOSPITAL_COMMUNITY): Payer: Self-pay | Admitting: *Deleted

## 2013-06-12 ENCOUNTER — Emergency Department (HOSPITAL_COMMUNITY)
Admission: EM | Admit: 2013-06-12 | Discharge: 2013-06-12 | Disposition: A | Discharge status: Home / Self Care | Payer: Self-pay | Attending: Emergency Medicine | Admitting: Emergency Medicine

## 2013-06-12 DIAGNOSIS — E785 Hyperlipidemia, unspecified: Secondary | ICD-10-CM | POA: Insufficient documentation

## 2013-06-12 DIAGNOSIS — Z8673 Personal history of transient ischemic attack (TIA), and cerebral infarction without residual deficits: Secondary | ICD-10-CM | POA: Insufficient documentation

## 2013-06-12 DIAGNOSIS — Z79899 Other long term (current) drug therapy: Secondary | ICD-10-CM | POA: Insufficient documentation

## 2013-06-12 DIAGNOSIS — L03211 Cellulitis of face: Secondary | ICD-10-CM | POA: Insufficient documentation

## 2013-06-12 DIAGNOSIS — R51 Headache: Secondary | ICD-10-CM | POA: Insufficient documentation

## 2013-06-12 DIAGNOSIS — L0201 Cutaneous abscess of face: Secondary | ICD-10-CM | POA: Insufficient documentation

## 2013-06-12 DIAGNOSIS — F3289 Other specified depressive episodes: Secondary | ICD-10-CM | POA: Insufficient documentation

## 2013-06-12 DIAGNOSIS — I10 Essential (primary) hypertension: Secondary | ICD-10-CM | POA: Insufficient documentation

## 2013-06-12 DIAGNOSIS — F17200 Nicotine dependence, unspecified, uncomplicated: Secondary | ICD-10-CM | POA: Insufficient documentation

## 2013-06-12 DIAGNOSIS — Z8679 Personal history of other diseases of the circulatory system: Secondary | ICD-10-CM | POA: Insufficient documentation

## 2013-06-12 DIAGNOSIS — I2510 Atherosclerotic heart disease of native coronary artery without angina pectoris: Secondary | ICD-10-CM | POA: Insufficient documentation

## 2013-06-12 DIAGNOSIS — Z7982 Long term (current) use of aspirin: Secondary | ICD-10-CM | POA: Insufficient documentation

## 2013-06-12 DIAGNOSIS — F329 Major depressive disorder, single episode, unspecified: Secondary | ICD-10-CM | POA: Insufficient documentation

## 2013-06-12 DIAGNOSIS — Z7901 Long term (current) use of anticoagulants: Secondary | ICD-10-CM | POA: Insufficient documentation

## 2013-06-12 MED ORDER — LIDOCAINE HCL (PF) 1 % IJ SOLN
5.0000 mL | Freq: Once | INTRAMUSCULAR | Status: AC
  Administered 2013-06-12: 5 mL via INTRADERMAL
  Filled 2013-06-12: qty 5

## 2013-06-12 NOTE — ED Provider Notes (Signed)
Medical screening examination/treatment/procedure(s) were performed by non-physician practitioner and as supervising physician I was immediately available for consultation/collaboration.   Benny Lennert, MD 06/12/13 1436

## 2013-06-12 NOTE — ED Provider Notes (Signed)
History     CSN: [id number]  Arrival date & time 06/12/13  4098   First MD Initiated Contact with Patient 06/12/13 [phone]      Chief Complaint  Patient presents with  . Wound Check    (Consider location/radiation/quality/duration/timing/severity/associated sxs/prior treatment) HPI Comments: Steve Vang is a 47 y.o. male who presents to the Emergency Department complaining of abscess to his right face for 2-3 days. He was seen here 2 days ago for same symptoms and refused I&D at that time. He states that the swelling and redness have improved somewhat and it is still draining but he still has pain to his face. He denies any difficulty swallowing or breathing, fever, chills, or vomiting.   Patient is a 47 y.o. male presenting with wound check.  Wound Check Pertinent negatives include no arthralgias, chills, fever, joint swelling, nausea or vomiting.    Past Medical History  Diagnosis Date  . CAD (coronary artery disease)     Multivessel, LVEF 45-50%  . Cerebrovascular disease     Stroke 2004  . Hyperlipidemia   . Hypertension   . Left ventricular mural thrombus     Apical  . Depression     Past Surgical History  Procedure Laterality Date  . Coronary artery bypass graft      DOR anterior ventricular restoration surgery 8/06, [facility] [place] to first diagonal , SVG to PLB, SVG to RVE branch of nondominant RCA  . Eye surgery      Family History  Problem Relation Age of Onset  . Hypertension Mother   . Diabetes Mother     History  Substance Use Topics  . Smoking status: Current Every Day Smoker -- 1.50 packs/day    Types: Cigarettes  . Smokeless tobacco: Never Used  . Alcohol Use: No      Review of Systems  Constitutional: Negative for fever and chills.  Gastrointestinal: Negative for nausea and vomiting.  Musculoskeletal: Negative for joint swelling and arthralgias.  Skin: Positive for color change.       Abscess   Hematological: Negative for  adenopathy.  All other systems reviewed and are negative.    Allergies  Review of patient's allergies indicates no known allergies.  Home Medications   Current Outpatient Rx  Name  Route  Sig  Dispense  Refill  . aspirin 81 MG tablet   Oral   Take 81 mg by mouth daily.           . citalopram (CELEXA) 20 MG tablet   Oral   Take 20 mg by mouth daily.         Marland Kitchen lisinopril (PRINIVIL,ZESTRIL) 20 MG tablet   Oral   Take 1 tablet (20 mg total) by mouth daily.   90 tablet   3   . lovastatin (MEVACOR) 20 MG tablet   Oral   Take 1 tablet (20 mg total) by mouth at bedtime.   30 tablet   5   . metoprolol tartrate (LOPRESSOR) 25 MG tablet   Oral   Take 1 tablet (25 mg total) by mouth 2 (two) times daily.   180 tablet   3   . oxyCODONE-acetaminophen (PERCOCET/ROXICET) 5-325 MG per tablet   Oral   Take 1 tablet by mouth every 4 (four) hours as needed for pain.   20 tablet   0   . sulfamethoxazole-trimethoprim (SEPTRA DS) 800-160 MG per tablet   Oral   Take 1 tablet by mouth 2 (two) times  daily.   28 tablet   0   . warfarin (COUMADIN) 5 MG tablet   Oral   Take 5-7.5 mg by mouth daily. 5 mg daily except 7.5 mg tablets on Mondays and Thursdays         . nitroGLYCERIN (NITROSTAT) 0.4 MG SL tablet   Sublingual   Place 1 tablet (0.4 mg total) under the tongue every 5 (five) minutes as needed.   100 tablet   3     BP 133/87  Pulse 65  Temp(Src) 97.9 F (36.6 C) (Oral)  Resp 20  SpO2 95%  Physical Exam  Nursing note and vitals reviewed. Constitutional: He is oriented to person, place, and time. He appears well-developed and well-nourished. No distress.  HENT:  Head: Normocephalic and atraumatic.    Localized area of induration with mild drainage present. Surrounding erythema and STS from previous visit have improved  Eyes: EOM are normal. Pupils are equal, round, and reactive to light.  Neck: Normal range of motion. Neck supple. No thyromegaly present.    Cardiovascular: Normal rate, regular rhythm, normal heart sounds and intact distal pulses.   No murmur heard. Pulmonary/Chest: Effort normal and breath sounds normal. No respiratory distress.  Musculoskeletal: Normal range of motion.  Lymphadenopathy:    He has no cervical adenopathy.  Neurological: He is alert and oriented to person, place, and time. He exhibits normal muscle tone. Coordination normal.  Skin: Skin is warm. There is erythema.  See HENT exam    ED Course  Procedures (including critical care time)  Labs Reviewed - No data to display No results found.      MDM   INCISION AND DRAINAGE Performed by: Maxwell Caul. Consent: Verbal consent obtained. Risks and benefits: risks, benefits and alternatives were discussed Type: abscess  Body area:  Right cheek Anesthesia: local infiltration  Incision was made with a #11 scalpel.  Local anesthetic: lidocaine 1 % w/o epinephrine  Anesthetic total: 2 ml  Complexity: complex Blunt dissection to break up loculations  Drainage: purulent  Drainage amount: mild  Packing material: 1/4 in iodoform gauze  Patient tolerance: Patient tolerated the procedure well with no immediate complications.     Patient is currently taking Septra DS twice daily. He agrees to continue antibiotics and warm compresses to his face. Advised him to return here for any worsening symptoms. Patient's wife states she feels comfortable removing the packing in 2 days  Vital signs are stable. Patient is nontoxic appearing. States he is feeling better.     Stafford Riviera L. Trisha Mangle, PA-C 06/12/13 [phone]

## 2013-06-12 NOTE — ED Notes (Signed)
Pt site dressed with telfa and medipore tape. Pt tolerated well.Home care instructions given. Pt verbalized understanding.

## 2013-06-12 NOTE — ED Notes (Signed)
Seen here for abscess to right side of face x 2 days ago.  States is taking abx and area is not much better.  States swelling is somewhat better.

## 2013-06-14 ENCOUNTER — Emergency Department (HOSPITAL_COMMUNITY)
Admission: EM | Admit: 2013-06-14 | Discharge: 2013-06-14 | Disposition: A | Discharge status: Home / Self Care | Payer: Self-pay | Attending: Emergency Medicine | Admitting: Emergency Medicine

## 2013-06-14 DIAGNOSIS — Z7982 Long term (current) use of aspirin: Secondary | ICD-10-CM | POA: Insufficient documentation

## 2013-06-14 DIAGNOSIS — Z951 Presence of aortocoronary bypass graft: Secondary | ICD-10-CM | POA: Insufficient documentation

## 2013-06-14 DIAGNOSIS — E785 Hyperlipidemia, unspecified: Secondary | ICD-10-CM | POA: Insufficient documentation

## 2013-06-14 DIAGNOSIS — Z7901 Long term (current) use of anticoagulants: Secondary | ICD-10-CM | POA: Insufficient documentation

## 2013-06-14 DIAGNOSIS — Z5189 Encounter for other specified aftercare: Secondary | ICD-10-CM

## 2013-06-14 DIAGNOSIS — Z4801 Encounter for change or removal of surgical wound dressing: Secondary | ICD-10-CM | POA: Insufficient documentation

## 2013-06-14 DIAGNOSIS — Z8679 Personal history of other diseases of the circulatory system: Secondary | ICD-10-CM | POA: Insufficient documentation

## 2013-06-14 DIAGNOSIS — I2510 Atherosclerotic heart disease of native coronary artery without angina pectoris: Secondary | ICD-10-CM | POA: Insufficient documentation

## 2013-06-14 DIAGNOSIS — I10 Essential (primary) hypertension: Secondary | ICD-10-CM | POA: Insufficient documentation

## 2013-06-14 DIAGNOSIS — Z79899 Other long term (current) drug therapy: Secondary | ICD-10-CM | POA: Insufficient documentation

## 2013-06-14 DIAGNOSIS — F17200 Nicotine dependence, unspecified, uncomplicated: Secondary | ICD-10-CM | POA: Insufficient documentation

## 2013-06-14 NOTE — ED Notes (Signed)
States that he was seen and evaluated for an abscess on his face and was to remove the packing today, states that only a small piece of the packing material came out and the rest is still in the wound.

## 2013-06-14 NOTE — ED Provider Notes (Signed)
History     CSN: [id number]  Arrival date & time 06/14/13  1153   First MD Initiated Contact with Patient 06/14/13 1208      Chief Complaint  Patient presents with  . Abscess    (Consider location/radiation/quality/duration/timing/severity/associated sxs/prior treatment) Patient is a 47 y.o. male presenting with wound check. The history is provided by the patient.  Wound Check This is a new problem. The current episode started in the past 7 days. The problem occurs daily. The problem has been gradually improving. Associated symptoms include chest pain. Pertinent negatives include no abdominal pain, arthralgias, coughing, fever, headaches, nausea, neck pain or vomiting. Nothing aggravates the symptoms. Treatments tried: I and D and antibiotics. The treatment provided mild relief.    Past Medical History  Diagnosis Date  . CAD (coronary artery disease)     Multivessel, LVEF 45-50%  . Cerebrovascular disease     Stroke 2004  . Hyperlipidemia   . Hypertension   . Left ventricular mural thrombus     Apical  . Depression     Past Surgical History  Procedure Laterality Date  . Coronary artery bypass graft      DOR anterior ventricular restoration surgery 8/06, [facility] [place] to first diagonal , SVG to PLB, SVG to RVE branch of nondominant RCA  . Eye surgery      Family History  Problem Relation Age of Onset  . Hypertension Mother   . Diabetes Mother     History  Substance Use Topics  . Smoking status: Current Every Day Smoker -- 1.50 packs/day    Types: Cigarettes  . Smokeless tobacco: Never Used  . Alcohol Use: No      Review of Systems  Constitutional: Negative for fever and activity change.       All ROS Neg except as noted in HPI  HENT: Negative for nosebleeds and neck pain.   Eyes: Negative for photophobia and discharge.  Respiratory: Negative for cough, shortness of breath and wheezing.   Cardiovascular: Positive for chest pain. Negative for  palpitations.  Gastrointestinal: Negative for nausea, vomiting, abdominal pain and blood in stool.  Genitourinary: Negative for dysuria, frequency and hematuria.  Musculoskeletal: Negative for back pain and arthralgias.  Skin: Negative.   Neurological: Negative for dizziness, seizures, speech difficulty and headaches.  Psychiatric/Behavioral: Negative for hallucinations and confusion.       Depression    Allergies  Review of patient's allergies indicates no known allergies.  Home Medications   Current Outpatient Rx  Name  Route  Sig  Dispense  Refill  . aspirin 81 MG tablet   Oral   Take 81 mg by mouth daily.           . citalopram (CELEXA) 20 MG tablet   Oral   Take 20 mg by mouth daily.         Marland Kitchen lisinopril (PRINIVIL,ZESTRIL) 20 MG tablet   Oral   Take 1 tablet (20 mg total) by mouth daily.   90 tablet   3   . lovastatin (MEVACOR) 20 MG tablet   Oral   Take 1 tablet (20 mg total) by mouth at bedtime.   30 tablet   5   . metoprolol tartrate (LOPRESSOR) 25 MG tablet   Oral   Take 1 tablet (25 mg total) by mouth 2 (two) times daily.   180 tablet   3   . nitroGLYCERIN (NITROSTAT) 0.4 MG SL tablet   Sublingual   Place 1  tablet (0.4 mg total) under the tongue every 5 (five) minutes as needed.   100 tablet   3   . oxyCODONE-acetaminophen (PERCOCET/ROXICET) 5-325 MG per tablet   Oral   Take 1 tablet by mouth every 4 (four) hours as needed for pain.   20 tablet   0   . sulfamethoxazole-trimethoprim (SEPTRA DS) 800-160 MG per tablet   Oral   Take 1 tablet by mouth 2 (two) times daily.   28 tablet   0   . warfarin (COUMADIN) 5 MG tablet   Oral   Take 5-7.5 mg by mouth daily. 5 mg daily except 7.5 mg tablets on Mondays and Thursdays           BP 106/61  Pulse 60  Temp(Src) 97.9 F (36.6 C) (Oral)  Resp 20  Ht 5\' 7"  (1.702 m)  Wt 161 lb (73.029 kg)  BMI 25.21 kg/m2  SpO2 99%  Physical Exam  Nursing note and vitals reviewed. Constitutional: He  is oriented to person, place, and time. He appears well-developed and well-nourished.  Non-toxic appearance.  HENT:  Head: Normocephalic.    Right Ear: Tympanic membrane and external ear normal.  Left Ear: Tympanic membrane and external ear normal.  Eyes: EOM and lids are normal. Pupils are equal, round, and reactive to light.  Neck: Normal range of motion. Neck supple. Carotid bruit is not present.  Cardiovascular: Normal rate, regular rhythm, normal heart sounds, intact distal pulses and normal pulses.   Pulmonary/Chest: Breath sounds normal. No respiratory distress.  Abdominal: Soft. Bowel sounds are normal. There is no tenderness. There is no guarding.  Musculoskeletal: Normal range of motion.  Lymphadenopathy:       Head (right side): No submandibular adenopathy present.       Head (left side): No submandibular adenopathy present.    He has no cervical adenopathy.  Neurological: He is alert and oriented to person, place, and time. He has normal strength. No cranial nerve deficit or sensory deficit.  Skin: Skin is warm and dry.  Psychiatric: He has a normal mood and affect. His speech is normal.    ED Course  Procedures (including critical care time)  Labs Reviewed - No data to display No results found.   1. Wound check, abscess       MDM  I have reviewed nursing notes, vital signs, and all appropriate lab and imaging results for this patient. Patient incision and drainage of an abscess of the right face ED a few days ago. The patient was told to take the packing out today. When the packing was removed the family felt that it was only a small amount compared what they thought was in the wound. They present to the emergency department to be evaluated to see if any of the packing is still left in the wound site.  The wound was inspected and there is no additional packing noted in the wound site. There no red streaks related to the abscess. There is minimal drainage present.  The patient is advised to use warm compresses to 3 times daily. To finish the Septra, and to change the bandage daily. Patient is to see the primary care physician or return to the emergency department if any changes, problems, or concerns.      Kathie Dike, PA-C 06/14/13 1347

## 2013-06-14 NOTE — ED Provider Notes (Signed)
Medical screening examination/treatment/procedure(s) were performed by non-physician practitioner and as supervising physician I was immediately available for consultation/collaboration.   Rahkeem Senft W Theus Espin, MD 06/14/13 1445 

## 2013-06-16 ENCOUNTER — Encounter (HOSPITAL_COMMUNITY): Payer: Self-pay | Admitting: Emergency Medicine

## 2013-06-21 NOTE — ED Provider Notes (Signed)
Medical screening examination/treatment/procedure(s) were performed by non-physician practitioner and as supervising physician I was immediately available for consultation/collaboration.   Daje Stark L Laresha Bacorn, MD 06/21/13 0255 

## 2013-07-06 ENCOUNTER — Ambulatory Visit (INDEPENDENT_AMBULATORY_CARE_PROVIDER_SITE_OTHER): Payer: Self-pay | Admitting: *Deleted

## 2013-07-06 DIAGNOSIS — I238 Other current complications following acute myocardial infarction: Secondary | ICD-10-CM

## 2013-07-06 DIAGNOSIS — Z7901 Long term (current) use of anticoagulants: Secondary | ICD-10-CM

## 2013-07-06 DIAGNOSIS — I6350 Cerebral infarction due to unspecified occlusion or stenosis of unspecified cerebral artery: Secondary | ICD-10-CM

## 2013-07-07 ENCOUNTER — Encounter: Payer: Self-pay | Admitting: Adult Health

## 2013-07-07 ENCOUNTER — Ambulatory Visit (INDEPENDENT_AMBULATORY_CARE_PROVIDER_SITE_OTHER): Payer: Self-pay | Admitting: Adult Health

## 2013-07-07 VITALS — BP 110/68 | HR 66 | Ht 67.0 in | Wt 164.6 lb

## 2013-07-07 DIAGNOSIS — I2510 Atherosclerotic heart disease of native coronary artery without angina pectoris: Secondary | ICD-10-CM

## 2013-07-07 DIAGNOSIS — G44019 Episodic cluster headache, not intractable: Secondary | ICD-10-CM

## 2013-07-07 DIAGNOSIS — I2582 Chronic total occlusion of coronary artery: Secondary | ICD-10-CM

## 2013-07-07 DIAGNOSIS — I10 Essential (primary) hypertension: Secondary | ICD-10-CM

## 2013-07-07 NOTE — Patient Instructions (Signed)
Your physician recommends that you schedule a follow-up appointment in: 1 MONTH  Non-Cardiac CT scanning, (CAT scanning), is a noninvasive, special x-ray that produces cross-sectional images of the body using x-rays and a computer. CT scans help physicians diagnose and treat medical conditions. For some CT exams, a contrast material is used to enhance visibility in the area of the body being studied. CT scans provide greater clarity and reveal more details than regular x-ray exams.  .Your physician recommends that you return for lab work in TODAY. BMET, MAGNESIUM,PT/INR

## 2013-07-07 NOTE — Progress Notes (Signed)
HPI: [name] is a 47 year old patient of Dr. Diona Browner we are following for ongoing assessment and management of CAD, history of CABG, hypertension, CVA, and history of depression with ongoing tobacco abuse. He remains on Coumadin. He was last seen in the office in June of 2013 and was without cardiac complaint. He was advised on smoking cessation.    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 No complaints of dizziness or blurred vision. No chest pain. He just got back from a trip to [place] where he did a lot of drinking. He has recently been treated for an abscess of his upper right tooth with antibiotics. He was recently seen in coumadin clinic with supratherapeutic INR of 4.2. He has held coumadin for one day.   No Known Allergies  Current Outpatient Prescriptions  Medication Sig Dispense Refill  . aspirin 81 MG tablet Take 81 mg by mouth daily.        . citalopram (CELEXA) 20 MG tablet Take 20 mg by mouth daily.      Marland Kitchen lisinopril (PRINIVIL,ZESTRIL) 20 MG tablet Take 1 tablet (20 mg total) by mouth daily.  90 tablet  3  . lovastatin (MEVACOR) 20 MG tablet Take 1 tablet (20 mg total) by mouth at bedtime.  30 tablet  5  . metoprolol tartrate (LOPRESSOR) 25 MG tablet Take 1 tablet (25 mg total) by mouth 2 (two) times daily.  180 tablet  3  . nitroGLYCERIN (NITROSTAT) 0.4 MG SL tablet Place 1 tablet (0.4 mg total) under the tongue every 5 (five) minutes as needed.  100 tablet  3  . oxyCODONE-acetaminophen (PERCOCET/ROXICET) 5-325 MG per tablet Take 1 tablet by mouth every 4 (four) hours as needed for pain.  20 tablet  0  . warfarin (COUMADIN) 5 MG tablet Take 5-7.5 mg by mouth daily. 5 mg daily except 7.5 mg tablets on Mondays and Thursdays       No current facility-administered medications for this visit.    Past Medical History  Diagnosis Date    . CAD (coronary artery disease)     Multivessel, LVEF 45-50%  . Cerebrovascular disease     Stroke 2004  . Hyperlipidemia   . Hypertension   . Left ventricular mural thrombus     Apical  . Depression     Past Surgical History  Procedure Laterality Date  . Coronary artery bypass graft      DOR anterior ventricular restoration surgery 8/06, [facility] [place] to first diagonal , SVG to PLB, SVG to RVE branch of nondominant RCA  . Eye surgery      YNW:GNFAOZ of systems complete and found to be negative unless listed above  PHYSICAL EXAM BP 110/68  Pulse 66  Ht 5\' 7"  (1.702 m)  Wt 164 lb 9.6 oz (74.662 kg)  BMI 25.77 kg/m2  SpO2 92%  General: Well developed, well nourished, in no acute distress Head: Eyes PERRLA, No xanthomas.   Normal cephalic and atramatic  Lungs: Clear bilaterally to auscultation and percussion. Heart: HRRR S1 S2, without MRG.  Pulses are 2+ & equal.            No carotid bruit. No JVD.  Abdomen: Bowel sounds are positive, abdomen soft and non-tender without masses or  Hernia's noted. Msk:  Back normal, normal gait. Normal strength and tone for age. Extremities: No clubbing, cyanosis or edema.  DP +1 Neuro: Alert and oriented X 3. No focal neurodeficits. Psych:  Good affect, responds appropriately  EKG: NSR rate of 60 bpm.   ASSESSMENT AND PLAN

## 2013-07-07 NOTE — Assessment & Plan Note (Signed)
No recurrent chest pain or DOE.  He will continue risk stratification. I have asked him to stop smoking as this is a significant risk factor

## 2013-07-07 NOTE — Assessment & Plan Note (Signed)
He has complaints of unilateral headache on the right side with tightness and pressure noted. 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 2 yesterday. He is taking a lot of goody powders for pain. I will have CT scan of the head to evaluate for bleed or other intracranial abnormality. BMET and Magnesium with tingling in his hands.

## 2013-07-07 NOTE — Assessment & Plan Note (Signed)
Blood pressure is well controlled at present. He is medically complaint. Will continue current medications as directed.

## 2013-07-09 ENCOUNTER — Ambulatory Visit (HOSPITAL_COMMUNITY)
Admission: RE | Admit: 2013-07-09 | Discharge: 2013-07-09 | Disposition: A | Discharge status: Home / Self Care | Payer: Self-pay | Source: Ambulatory Visit | Attending: Adult Health | Admitting: Adult Health

## 2013-07-09 ENCOUNTER — Other Ambulatory Visit: Payer: Self-pay | Admitting: *Deleted

## 2013-07-09 DIAGNOSIS — Z8673 Personal history of transient ischemic attack (TIA), and cerebral infarction without residual deficits: Secondary | ICD-10-CM | POA: Insufficient documentation

## 2013-07-09 DIAGNOSIS — R51 Headache: Secondary | ICD-10-CM | POA: Insufficient documentation

## 2013-07-09 DIAGNOSIS — I10 Essential (primary) hypertension: Secondary | ICD-10-CM

## 2013-07-09 DIAGNOSIS — H538 Other visual disturbances: Secondary | ICD-10-CM | POA: Insufficient documentation

## 2013-07-09 DIAGNOSIS — G44019 Episodic cluster headache, not intractable: Secondary | ICD-10-CM

## 2013-07-09 DIAGNOSIS — I2582 Chronic total occlusion of coronary artery: Secondary | ICD-10-CM

## 2013-07-09 LAB — BASIC METABOLIC PANEL
BUN: 12 mg/dL (ref 6–23)
CO2: 29 mEq/L (ref 19–32)
Chloride: 100 mEq/L (ref 96–112)
Creat: 1.19 mg/dL (ref 0.50–1.35)
Glucose, Bld: 115 mg/dL — ABNORMAL HIGH (ref 70–99)

## 2013-07-16 ENCOUNTER — Ambulatory Visit (INDEPENDENT_AMBULATORY_CARE_PROVIDER_SITE_OTHER): Payer: Self-pay | Admitting: *Deleted

## 2013-07-16 DIAGNOSIS — Z7901 Long term (current) use of anticoagulants: Secondary | ICD-10-CM

## 2013-07-16 DIAGNOSIS — I6350 Cerebral infarction due to unspecified occlusion or stenosis of unspecified cerebral artery: Secondary | ICD-10-CM

## 2013-07-16 DIAGNOSIS — I238 Other current complications following acute myocardial infarction: Secondary | ICD-10-CM

## 2013-07-20 ENCOUNTER — Telehealth: Payer: Self-pay | Admitting: *Deleted

## 2013-07-20 MED ORDER — LISINOPRIL 20 MG PO TABS: 20.0000 mg | ORAL_TABLET | Freq: Every day | ORAL | Status: DC

## 2013-07-20 NOTE — Telephone Encounter (Signed)
rx sent to pharmacy by e-script  

## 2013-07-20 NOTE — Telephone Encounter (Signed)
Pt needs lisinopril called in to [facility] in [place], pt is out of meds

## 2013-07-27 ENCOUNTER — Telehealth: Payer: Self-pay | Admitting: Cardiology

## 2013-07-27 MED ORDER — WARFARIN SODIUM 5 MG PO TABS: ORAL_TABLET | ORAL | Status: DC

## 2013-07-27 NOTE — Telephone Encounter (Signed)
Medication sent via escribe.  

## 2013-07-27 NOTE — Telephone Encounter (Signed)
Needs refill on Warfarin sent to [facility] in [place]

## 2013-08-03 ENCOUNTER — Ambulatory Visit (INDEPENDENT_AMBULATORY_CARE_PROVIDER_SITE_OTHER): Payer: Self-pay | Admitting: *Deleted

## 2013-08-03 DIAGNOSIS — Z7901 Long term (current) use of anticoagulants: Secondary | ICD-10-CM

## 2013-08-03 DIAGNOSIS — I6350 Cerebral infarction due to unspecified occlusion or stenosis of unspecified cerebral artery: Secondary | ICD-10-CM

## 2013-08-03 DIAGNOSIS — I238 Other current complications following acute myocardial infarction: Secondary | ICD-10-CM

## 2013-08-07 ENCOUNTER — Encounter: Payer: Self-pay | Admitting: Adult Health

## 2013-08-07 ENCOUNTER — Ambulatory Visit (INDEPENDENT_AMBULATORY_CARE_PROVIDER_SITE_OTHER): Payer: Self-pay | Admitting: Adult Health

## 2013-08-07 VITALS — BP 129/83 | HR 59 | Ht 67.0 in | Wt 163.0 lb

## 2013-08-07 DIAGNOSIS — I10 Essential (primary) hypertension: Secondary | ICD-10-CM

## 2013-08-07 DIAGNOSIS — I2510 Atherosclerotic heart disease of native coronary artery without angina pectoris: Secondary | ICD-10-CM

## 2013-08-07 MED ORDER — NITROGLYCERIN 0.4 MG SL SUBL: 0.4000 mg | SUBLINGUAL_TABLET | SUBLINGUAL | Status: DC | PRN

## 2013-08-07 NOTE — Progress Notes (Deleted)
Name: Steve Vang    DOB: [birth year]  Age: 47 y.o.  MR#: [id number]       PCP:  No primary provider on file.      Insurance: Payor: / No coverage found.  CC:    Chief Complaint  Patient presents with  . Hypertension  . Coronary Artery Disease    VS Filed Vitals:   08/07/13 1327  BP: 129/83  Pulse: 59  Height: 5\' 7"  (1.702 m)  Weight: 163 lb (73.936 kg)    Weights Current Weight  08/07/13 163 lb (73.936 kg)  07/07/13 164 lb 9.6 oz (74.662 kg)  06/14/13 161 lb (73.029 kg)    Blood Pressure  BP Readings from Last 3 Encounters:  08/07/13 129/83  07/07/13 110/68  06/14/13 106/61     Admit date:  (Not on file) Last encounter with RMR:  07/07/2013   Allergy Review of patient's allergies indicates no known allergies.  Current Outpatient Prescriptions  Medication Sig Dispense Refill  . aspirin 81 MG tablet Take 81 mg by mouth daily.        . citalopram (CELEXA) 20 MG tablet Take 20 mg by mouth daily.      Marland Kitchen lisinopril (PRINIVIL,ZESTRIL) 20 MG tablet Take 1 tablet (20 mg total) by mouth daily.  90 tablet  1  . lovastatin (MEVACOR) 20 MG tablet Take 1 tablet (20 mg total) by mouth at bedtime.  30 tablet  5  . metoprolol tartrate (LOPRESSOR) 25 MG tablet Take 1 tablet (25 mg total) by mouth 2 (two) times daily.  180 tablet  3  . nitroGLYCERIN (NITROSTAT) 0.4 MG SL tablet Place 1 tablet (0.4 mg total) under the tongue every 5 (five) minutes as needed.  100 tablet  3  . oxyCODONE-acetaminophen (PERCOCET/ROXICET) 5-325 MG per tablet Take 1 tablet by mouth every 4 (four) hours as needed for pain.  20 tablet  0  . warfarin (COUMADIN) 5 MG tablet 1 tablet daily except 1 1/2 tablets on Mondays, Wednesdays and Fridays or as directed.  45 tablet  3   No current facility-administered medications for this visit.    Discontinued Meds:   There are no discontinued medications.  Patient Active Problem List   Diagnosis Date Noted  . Encounter for long-term (current) use of anticoagulants  04/16/2011  . GAIT IMBALANCE 02/02/2011  . ABNORMAL CV (STRESS) TEST 10/02/2010  . MURAL THROMBUS, LEFT VENTRICLE 09/08/2010  . OTHER SPEC FORMS CHRONIC ISCHEMIC HEART DISEASE 06/07/2010  . Mixed hyperlipidemia 03/02/2010  . TOBACCO ABUSE 03/02/2010  . ESSENTIAL HYPERTENSION, BENIGN 03/02/2010  . CORONARY ATHEROSCLEROSIS NATIVE CORONARY ARTERY 03/02/2010  . CVA 03/01/2010    LABS    Component Value Date/Time   NA 137 07/07/2013 1504   NA 141 09/04/2010 0434   NA 137 09/03/2010 1752   K 4.7 07/07/2013 1504   K 4.6 09/04/2010 0434   K 4.6 09/03/2010 1752   CL 100 07/07/2013 1504   CL 106 09/04/2010 0434   CL 104 09/03/2010 1752   CO2 29 07/07/2013 1504   CO2 31 09/04/2010 0434   CO2 26 09/03/2010 1752   GLUCOSE 115* 07/07/2013 1504   GLUCOSE 96 09/04/2010 0434   GLUCOSE 92 09/03/2010 1752   BUN 12 07/07/2013 1504   BUN 12 09/04/2010 0434   BUN 12 09/03/2010 1752   CREATININE 1.19 07/07/2013 1504   CREATININE 1.09 09/04/2010 0434   CREATININE 1.10 09/03/2010 1752   CREATININE 1.09 06/21/2010 1835   CALCIUM 10.1 07/07/2013  1504   CALCIUM 9.3 09/04/2010 0434   CALCIUM 9.3 09/03/2010 1752   GFRNONAA >60 09/04/2010 0434   GFRNONAA >60 09/03/2010 1752   GFRNONAA >60 04/27/2010 1113   GFRAA  Value: >60        The eGFR has been calculated using the MDRD equation. This calculation has not been validated in all clinical situations. eGFR's persistently <60 mL/min signify possible Chronic Kidney Disease. 09/04/2010 0434   GFRAA  Value: >60        The eGFR has been calculated using the MDRD equation. This calculation has not been validated in all clinical situations. eGFR's persistently <60 mL/min signify possible Chronic Kidney Disease. 09/03/2010 1752   GFRAA  Value: >60        The eGFR has been calculated using the MDRD equation. This calculation has not been validated in all clinical situations. eGFR's persistently <60 mL/min signify possible Chronic Kidney Disease. 04/27/2010 1113   CMP     Component Value Date/Time   NA 137 07/07/2013  1504   K 4.7 07/07/2013 1504   CL 100 07/07/2013 1504   CO2 29 07/07/2013 1504   GLUCOSE 115* 07/07/2013 1504   BUN 12 07/07/2013 1504   CREATININE 1.19 07/07/2013 1504   CREATININE 1.09 09/04/2010 0434   CALCIUM 10.1 07/07/2013 1504   PROT 6.9 03/28/2011 0829   ALBUMIN 4.3 03/28/2011 0829   AST 22 03/28/2011 0829   ALT 27 03/28/2011 0829   ALKPHOS 119* 03/28/2011 0829   BILITOT 0.4 03/28/2011 0829   GFRNONAA >60 09/04/2010 0434   GFRAA  Value: >60        The eGFR has been calculated using the MDRD equation. This calculation has not been validated in all clinical situations. eGFR's persistently <60 mL/min signify possible Chronic Kidney Disease. 09/04/2010 0434       Component Value Date/Time   WBC 6.2 09/04/2010 0434   WBC 6.5 09/03/2010 1752   WBC 9.3 04/27/2010 1113   HGB 14.5 09/04/2010 0434   HGB 14.7 09/03/2010 1752   HGB 16.1 04/27/2010 1113   HCT 42.0 09/04/2010 0434   HCT 43.5 09/03/2010 1752   HCT 45.7 04/27/2010 1113   MCV 90.3 09/04/2010 0434   MCV 92.5 09/03/2010 1752   MCV 91.7 04/27/2010 1113    Lipid Panel     Component Value Date/Time   CHOL 156 03/28/2011 0829   TRIG 100 03/28/2011 0829   HDL 40 03/28/2011 0829   CHOLHDL 3.9 03/28/2011 0829   VLDL 20 03/28/2011 0829   LDLCALC 96 03/28/2011 0829    ABG No results found for this basename: phart, pco2, pco2art, po2, po2art, hco3, tco2, acidbasedef, o2sat     Lab Results  Component Value Date   TSH 2.934 09/04/2010   BNP (last 3 results) No results found for this basename: PROBNP,  in the last 8760 hours Cardiac Panel (last 3 results) No results found for this basename: CKTOTAL, CKMB, TROPONINI, RELINDX,  in the last 72 hours  Iron/TIBC/Ferritin No results found for this basename: iron, tibc, ferritin     EKG Orders placed in visit on 07/07/13  . EKG 12-LEAD     Prior Assessment and Plan Problem List as of 08/07/2013     Cardiovascular and Mediastinum   ESSENTIAL HYPERTENSION, BENIGN   Last Assessment & Plan   07/07/2013 Office Visit  Written 07/07/2013  3:06 PM by Jodelle Gross, NP     Blood pressure is well controlled at present. He is medically  complaint. Will continue current medications as directed.    CORONARY ATHEROSCLEROSIS NATIVE CORONARY ARTERY   Last Assessment & Plan   07/07/2013 Office Visit Written 07/07/2013  3:07 PM by Jodelle Gross, NP     No recurrent chest pain or DOE.  He will continue risk stratification. I have asked him to stop smoking as this is a significant risk factor    OTHER SPEC FORMS CHRONIC ISCHEMIC HEART DISEASE   MURAL THROMBUS, LEFT VENTRICLE   Last Assessment & Plan   06/27/2012 Office Visit Written 06/27/2012 12:39 PM by Jodelle Gross, NP     He remains on coumadin. I will check CBC and continue INR checks.     CVA   Last Assessment & Plan   07/07/2013 Office Visit Written 07/07/2013  3:05 PM by Jodelle Gross, NP     He has complaints of unilateral headache on the right side with tightness and pressure noted. 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 2 yesterday. He is taking a lot of goody powders for pain. I will have CT scan of the head to evaluate for bleed or other intracranial abnormality. BMET and Magnesium with tingling in his hands.      Other   Mixed hyperlipidemia   Last Assessment & Plan   04/03/2011 Office Visit Written 04/03/2011  1:18 PM by Jonelle Sidle, MD     Lipids are improving on statin therapy.    TOBACCO ABUSE   Last Assessment & Plan   06/27/2012 Office Visit Written 06/27/2012 12:37 PM by Jodelle Gross, NP     I have spoken to him again about smoking cessation and his known CAD. He appears sheepish but has no plans to quit at this time.    ABNORMAL CV (STRESS) TEST   GAIT IMBALANCE   Encounter for long-term (current) use of anticoagulants       Imaging: Ct Head Wo Contrast  07/09/2013   *RADIOLOGY REPORT*  Clinical Data: Right-sided headache with blurred vision  CT HEAD WITHOUT CONTRAST   Technique:  Contiguous axial images were obtained from the base of the skull through the vertex without contrast.  Comparison: MRI 03/20/2011  Findings: Chronic infarct right posterior frontal cortex, unchanged.  This extends down to the operculum.  No acute infarct, hemorrhage, or mass.  Ventricle size is normal. No acute bony abnormality in the skull.  IMPRESSION: Chronic right frontal infarct.  No acute abnormality.   Original Report Authenticated By: Janeece Riggers, M.D.

## 2013-08-07 NOTE — Progress Notes (Signed)
   HPI: [name] is a 47 year old patient of Dr. Diona Browner we are following for ongoing assessment and management of CAD, history of CABG, hypertension, CVA, and ongoing tobacco abuse. Patient remains on Coumadin. The patient was last seen in the office on 07/08/1999 4T with complaints of aches on the rise of his head with tingling in both hands described as tightness and heaviness. The patient also has an excessive drinking prior to this episode. The patient also had a tooth abscess and was being treated with antibiotics. She was sent for CT scan of the head to rule out bleed with Coumadin and frequent use of Goody powders with numbness and tingling noted in his arms bilaterally. This was found to be negative for a bleed the patient was called with reassurance given.   He continues to have complaints of headache, but no recurrent chest pain.   No Known Allergies  Current Outpatient Prescriptions  Medication Sig Dispense Refill  . aspirin 81 MG tablet Take 81 mg by mouth daily.        . citalopram (CELEXA) 20 MG tablet Take 20 mg by mouth daily.      Marland Kitchen lisinopril (PRINIVIL,ZESTRIL) 20 MG tablet Take 1 tablet (20 mg total) by mouth daily.  90 tablet  1  . lovastatin (MEVACOR) 20 MG tablet Take 1 tablet (20 mg total) by mouth at bedtime.  30 tablet  5  . metoprolol tartrate (LOPRESSOR) 25 MG tablet Take 1 tablet (25 mg total) by mouth 2 (two) times daily.  180 tablet  3  . nitroGLYCERIN (NITROSTAT) 0.4 MG SL tablet Place 1 tablet (0.4 mg total) under the tongue every 5 (five) minutes as needed.  100 tablet  3  . oxyCODONE-acetaminophen (PERCOCET/ROXICET) 5-325 MG per tablet Take 1 tablet by mouth every 4 (four) hours as needed for pain.  20 tablet  0  . warfarin (COUMADIN) 5 MG tablet 1 tablet daily except 1 1/2 tablets on Mondays, Wednesdays and Fridays or as directed.  45 tablet  3   No current facility-administered medications for this visit.    Past Medical History  Diagnosis Date  . CAD  (coronary artery disease)     Multivessel, LVEF 45-50%  . Cerebrovascular disease     Stroke 2004  . Hyperlipidemia   . Hypertension   . Left ventricular mural thrombus     Apical  . Depression     Past Surgical History  Procedure Laterality Date  . Coronary artery bypass graft      DOR anterior ventricular restoration surgery 8/06, [facility] - [place] to first diagonal , SVG to PLB, SVG to RVE branch of nondominant RCA  . Eye surgery      ROS: Review of systems complete and found to be negative unless listed above  PHYSICAL EXAM BP 129/83  Pulse 59  Ht 5\' 7"  (1.702 m)  Wt 163 lb (73.936 kg)  BMI 25.52 kg/m2  General: Well developed, well nourished, in no acute distress  Lungs: Clear bilaterally to auscultation and percussion. Heart: HRRR S1 S2, without MRG.  Pulses are 2+ & equal.            No carotid bruit. No JVD.  No abdominal bruits. No femoral bruits. Neuro: Alert and oriented X 3. Psych:  Good affect, responds appropriately    ASSESSMENT AND PLAN

## 2013-08-07 NOTE — Assessment & Plan Note (Signed)
Pressure is currently well controlled. No changes in medication. Advised to stay away from [facility] powders and BC powders.

## 2013-08-07 NOTE — Patient Instructions (Addendum)
Your physician recommends that you schedule a follow-up appointment in: ONE YEAR 

## 2013-08-07 NOTE — Assessment & Plan Note (Signed)
He denies recurrent chest pain, dizziness, or dyspnea on exertion. Unfortunately he continues to smoke. Advised him concerning the risk factor and the need to stop smoking. He is also requesting a refill on nitroglycerin sublingual. He has not used it since his bypass surgery, but would like to make sure he has a fresh bottle. This is been provided

## 2013-08-24 ENCOUNTER — Telehealth: Payer: Self-pay | Admitting: Adult Health

## 2013-08-24 MED ORDER — LOVASTATIN 20 MG PO TABS: 20.0000 mg | ORAL_TABLET | Freq: Every day | ORAL | Status: DC

## 2013-08-24 NOTE — Telephone Encounter (Signed)
NEEDS REFILL ON lOVASTATIN SENT TO [facility] [place]

## 2013-08-24 NOTE — Telephone Encounter (Signed)
Medication sent via escribe.  

## 2013-09-02 ENCOUNTER — Ambulatory Visit (INDEPENDENT_AMBULATORY_CARE_PROVIDER_SITE_OTHER): Payer: Self-pay | Admitting: *Deleted

## 2013-09-02 DIAGNOSIS — Z7901 Long term (current) use of anticoagulants: Secondary | ICD-10-CM

## 2013-09-02 DIAGNOSIS — I6350 Cerebral infarction due to unspecified occlusion or stenosis of unspecified cerebral artery: Secondary | ICD-10-CM

## 2013-09-02 DIAGNOSIS — I238 Other current complications following acute myocardial infarction: Secondary | ICD-10-CM

## 2013-09-30 ENCOUNTER — Ambulatory Visit (INDEPENDENT_AMBULATORY_CARE_PROVIDER_SITE_OTHER): Payer: Self-pay | Admitting: *Deleted

## 2013-09-30 DIAGNOSIS — Z7901 Long term (current) use of anticoagulants: Secondary | ICD-10-CM

## 2013-09-30 DIAGNOSIS — I238 Other current complications following acute myocardial infarction: Secondary | ICD-10-CM

## 2013-09-30 DIAGNOSIS — I6350 Cerebral infarction due to unspecified occlusion or stenosis of unspecified cerebral artery: Secondary | ICD-10-CM

## 2013-09-30 LAB — POCT INR: INR: 3.4

## 2013-10-20 ENCOUNTER — Telehealth: Payer: Self-pay | Admitting: *Deleted

## 2013-10-20 MED ORDER — METOPROLOL TARTRATE 25 MG PO TABS: 25.0000 mg | ORAL_TABLET | Freq: Two times a day (BID) | ORAL | Status: DC

## 2013-10-20 NOTE — Telephone Encounter (Signed)
Pt need refill on metoprolol 25 mg called in to [facility]/ pt has been out for a couple days please call in today.

## 2013-10-20 NOTE — Telephone Encounter (Signed)
rx sent to pharmacy by e-script  

## 2013-10-21 ENCOUNTER — Ambulatory Visit (INDEPENDENT_AMBULATORY_CARE_PROVIDER_SITE_OTHER): Payer: Self-pay | Admitting: *Deleted

## 2013-10-21 DIAGNOSIS — Z7901 Long term (current) use of anticoagulants: Secondary | ICD-10-CM

## 2013-10-21 DIAGNOSIS — I238 Other current complications following acute myocardial infarction: Secondary | ICD-10-CM

## 2013-10-21 DIAGNOSIS — I6350 Cerebral infarction due to unspecified occlusion or stenosis of unspecified cerebral artery: Secondary | ICD-10-CM

## 2013-10-21 LAB — POCT INR: INR: 2.2

## 2013-11-06 ENCOUNTER — Telehealth: Payer: Self-pay | Admitting: Adult Health

## 2013-11-06 MED ORDER — LISINOPRIL 20 MG PO TABS: 20.0000 mg | ORAL_TABLET | Freq: Every day | ORAL | Status: DC

## 2013-11-06 NOTE — Telephone Encounter (Signed)
rx sent to pharmacy by e-script  

## 2013-11-06 NOTE — Telephone Encounter (Signed)
Received fax refill request  Rx # [id number] Medication:  Lisinopril 20mg  tab Qty 90 Sig:  Take one tablet by mouth once daily Physician:  Lyman Bishop

## 2013-11-18 ENCOUNTER — Ambulatory Visit (INDEPENDENT_AMBULATORY_CARE_PROVIDER_SITE_OTHER): Payer: Self-pay | Admitting: *Deleted

## 2013-11-18 DIAGNOSIS — I238 Other current complications following acute myocardial infarction: Secondary | ICD-10-CM

## 2013-11-18 DIAGNOSIS — I6350 Cerebral infarction due to unspecified occlusion or stenosis of unspecified cerebral artery: Secondary | ICD-10-CM

## 2013-11-18 DIAGNOSIS — Z7901 Long term (current) use of anticoagulants: Secondary | ICD-10-CM

## 2013-12-16 ENCOUNTER — Ambulatory Visit (INDEPENDENT_AMBULATORY_CARE_PROVIDER_SITE_OTHER): Payer: Self-pay | Admitting: *Deleted

## 2013-12-16 DIAGNOSIS — Z7901 Long term (current) use of anticoagulants: Secondary | ICD-10-CM

## 2013-12-16 DIAGNOSIS — I6350 Cerebral infarction due to unspecified occlusion or stenosis of unspecified cerebral artery: Secondary | ICD-10-CM

## 2013-12-16 DIAGNOSIS — I238 Other current complications following acute myocardial infarction: Secondary | ICD-10-CM

## 2013-12-16 LAB — POCT INR: INR: 2.9

## 2014-01-13 ENCOUNTER — Ambulatory Visit (INDEPENDENT_AMBULATORY_CARE_PROVIDER_SITE_OTHER): Payer: Self-pay | Admitting: *Deleted

## 2014-01-13 DIAGNOSIS — I6350 Cerebral infarction due to unspecified occlusion or stenosis of unspecified cerebral artery: Secondary | ICD-10-CM

## 2014-01-13 DIAGNOSIS — Z7901 Long term (current) use of anticoagulants: Secondary | ICD-10-CM

## 2014-01-13 DIAGNOSIS — I238 Other current complications following acute myocardial infarction: Secondary | ICD-10-CM

## 2014-01-13 LAB — POCT INR: INR: 2.1

## 2014-02-17 ENCOUNTER — Telehealth: Payer: Self-pay | Admitting: Adult Health

## 2014-02-17 MED ORDER — LOVASTATIN 20 MG PO TABS: 20.0000 mg | ORAL_TABLET | Freq: Every day | ORAL | Status: DC

## 2014-02-17 MED ORDER — LISINOPRIL 20 MG PO TABS: 20.0000 mg | ORAL_TABLET | Freq: Every day | ORAL | Status: DC

## 2014-02-17 NOTE — Telephone Encounter (Signed)
Received fax refill request  Rx # [id number] Medication:  Lisinopril 20 mg tab Qty 90 Sig:  Take one tablet by mouth once daily Physician:  Lyman Bishop   Received fax refill request  Rx # [id number]  Medication: Lovastatin 20 mg Qty 30 Sig:  Physician: Lyman Bishop

## 2014-02-18 ENCOUNTER — Telehealth: Payer: Self-pay | Admitting: Adult Health

## 2014-02-18 MED ORDER — WARFARIN SODIUM 5 MG PO TABS: ORAL_TABLET | ORAL | Status: DC

## 2014-02-18 NOTE — Telephone Encounter (Signed)
Received fax refill request  Rx # [id number] Medication:  Warfarin 5 mg tab Qty 45  Sig:  Take as directed Physician:  Lyman Bishop

## 2014-02-18 NOTE — Telephone Encounter (Signed)
rx sent to pharmacy by e-script  

## 2014-03-01 ENCOUNTER — Encounter: Payer: Self-pay | Admitting: *Deleted

## 2014-03-03 ENCOUNTER — Ambulatory Visit (INDEPENDENT_AMBULATORY_CARE_PROVIDER_SITE_OTHER): Payer: Self-pay | Admitting: *Deleted

## 2014-03-03 DIAGNOSIS — Z5181 Encounter for therapeutic drug level monitoring: Secondary | ICD-10-CM

## 2014-03-03 DIAGNOSIS — I238 Other current complications following acute myocardial infarction: Secondary | ICD-10-CM

## 2014-03-03 DIAGNOSIS — Z7901 Long term (current) use of anticoagulants: Secondary | ICD-10-CM

## 2014-03-03 DIAGNOSIS — I6350 Cerebral infarction due to unspecified occlusion or stenosis of unspecified cerebral artery: Secondary | ICD-10-CM

## 2014-03-03 LAB — POCT INR: INR: 2.2

## 2014-04-14 ENCOUNTER — Ambulatory Visit (INDEPENDENT_AMBULATORY_CARE_PROVIDER_SITE_OTHER): Payer: Self-pay | Admitting: *Deleted

## 2014-04-14 ENCOUNTER — Encounter: Payer: Self-pay | Admitting: *Deleted

## 2014-04-14 DIAGNOSIS — I6350 Cerebral infarction due to unspecified occlusion or stenosis of unspecified cerebral artery: Secondary | ICD-10-CM

## 2014-04-14 DIAGNOSIS — I238 Other current complications following acute myocardial infarction: Secondary | ICD-10-CM

## 2014-04-14 DIAGNOSIS — Z5181 Encounter for therapeutic drug level monitoring: Secondary | ICD-10-CM

## 2014-04-14 DIAGNOSIS — Z7901 Long term (current) use of anticoagulants: Secondary | ICD-10-CM

## 2014-04-14 LAB — POCT INR: INR: 1.6

## 2014-05-05 ENCOUNTER — Ambulatory Visit (INDEPENDENT_AMBULATORY_CARE_PROVIDER_SITE_OTHER): Payer: Self-pay | Admitting: *Deleted

## 2014-05-05 DIAGNOSIS — I238 Other current complications following acute myocardial infarction: Secondary | ICD-10-CM

## 2014-05-05 DIAGNOSIS — I6350 Cerebral infarction due to unspecified occlusion or stenosis of unspecified cerebral artery: Secondary | ICD-10-CM

## 2014-05-05 DIAGNOSIS — Z7901 Long term (current) use of anticoagulants: Secondary | ICD-10-CM

## 2014-05-05 DIAGNOSIS — Z5181 Encounter for therapeutic drug level monitoring: Secondary | ICD-10-CM

## 2014-05-05 LAB — POCT INR: INR: 3.2

## 2014-06-02 ENCOUNTER — Ambulatory Visit (INDEPENDENT_AMBULATORY_CARE_PROVIDER_SITE_OTHER): Payer: Self-pay | Admitting: *Deleted

## 2014-06-02 DIAGNOSIS — I238 Other current complications following acute myocardial infarction: Secondary | ICD-10-CM

## 2014-06-02 DIAGNOSIS — I6350 Cerebral infarction due to unspecified occlusion or stenosis of unspecified cerebral artery: Secondary | ICD-10-CM

## 2014-06-02 DIAGNOSIS — Z7901 Long term (current) use of anticoagulants: Secondary | ICD-10-CM

## 2014-06-02 DIAGNOSIS — Z5181 Encounter for therapeutic drug level monitoring: Secondary | ICD-10-CM

## 2014-06-02 LAB — POCT INR: INR: 1.6

## 2014-06-23 ENCOUNTER — Encounter (HOSPITAL_COMMUNITY): Payer: Self-pay | Admitting: Emergency Medicine

## 2014-06-23 ENCOUNTER — Emergency Department (HOSPITAL_COMMUNITY)
Admission: EM | Admit: 2014-06-23 | Discharge: 2014-06-23 | Disposition: A | Discharge status: Home / Self Care | Payer: Self-pay | Attending: Emergency Medicine | Admitting: Emergency Medicine

## 2014-06-23 ENCOUNTER — Emergency Department (HOSPITAL_COMMUNITY): Payer: Self-pay

## 2014-06-23 DIAGNOSIS — I10 Essential (primary) hypertension: Secondary | ICD-10-CM | POA: Insufficient documentation

## 2014-06-23 DIAGNOSIS — F17200 Nicotine dependence, unspecified, uncomplicated: Secondary | ICD-10-CM | POA: Insufficient documentation

## 2014-06-23 DIAGNOSIS — Z86718 Personal history of other venous thrombosis and embolism: Secondary | ICD-10-CM | POA: Insufficient documentation

## 2014-06-23 DIAGNOSIS — Z8659 Personal history of other mental and behavioral disorders: Secondary | ICD-10-CM | POA: Insufficient documentation

## 2014-06-23 DIAGNOSIS — Z951 Presence of aortocoronary bypass graft: Secondary | ICD-10-CM | POA: Insufficient documentation

## 2014-06-23 DIAGNOSIS — I2510 Atherosclerotic heart disease of native coronary artery without angina pectoris: Secondary | ICD-10-CM | POA: Insufficient documentation

## 2014-06-23 DIAGNOSIS — Z7982 Long term (current) use of aspirin: Secondary | ICD-10-CM | POA: Insufficient documentation

## 2014-06-23 DIAGNOSIS — E785 Hyperlipidemia, unspecified: Secondary | ICD-10-CM | POA: Insufficient documentation

## 2014-06-23 DIAGNOSIS — Z7901 Long term (current) use of anticoagulants: Secondary | ICD-10-CM | POA: Insufficient documentation

## 2014-06-23 DIAGNOSIS — Z8673 Personal history of transient ischemic attack (TIA), and cerebral infarction without residual deficits: Secondary | ICD-10-CM | POA: Insufficient documentation

## 2014-06-23 DIAGNOSIS — M19019 Primary osteoarthritis, unspecified shoulder: Secondary | ICD-10-CM | POA: Insufficient documentation

## 2014-06-23 DIAGNOSIS — Z79899 Other long term (current) drug therapy: Secondary | ICD-10-CM | POA: Insufficient documentation

## 2014-06-23 MED ORDER — HYDROCODONE-ACETAMINOPHEN 5-325 MG PO TABS
1.0000 | ORAL_TABLET | Freq: Once | ORAL | Status: AC
  Administered 2014-06-23: 1 via ORAL
  Filled 2014-06-23: qty 1

## 2014-06-23 MED ORDER — CYCLOBENZAPRINE HCL 5 MG PO TABS: 5.0000 mg | ORAL_TABLET | Freq: Three times a day (TID) | ORAL | Status: DC | PRN

## 2014-06-23 MED ORDER — OXYCODONE-ACETAMINOPHEN 5-325 MG PO TABS: 1.0000 | ORAL_TABLET | ORAL | Status: DC | PRN

## 2014-06-23 NOTE — ED Notes (Signed)
Pt reports to the ED with complain of left shoulder pain that has lasted for "a couple months" and has worsened today. Pt states that left arm is "tingly" and "almost numb."

## 2014-06-23 NOTE — ED Notes (Signed)
J. Idol, PA at bedside. 

## 2014-06-23 NOTE — ED Notes (Signed)
Pt with left shoulder pain ongoing for months, pt denies any injury and states pain has been getting worse

## 2014-06-23 NOTE — ED Notes (Signed)
Pt verbalized understanding of no driving and to use caution within 4 hours of taking Percocet due to med causes drowsiness, also made aware med causes constipation as well

## 2014-06-23 NOTE — Care Management Note (Signed)
ED/CM noted patient did not have health insurance and/or PCP listed in the computer.  Patient was given the [place] resource handout with information on the clinics, food pantries, and the handout for new health insurance sign-up.  Patient expressed appreciation for information received. Pt was also given a Rx discount card.   

## 2014-06-23 NOTE — Discharge Instructions (Signed)
Osteoarthritis Osteoarthritis is a disease that causes soreness and swelling (inflammation) of a joint. It occurs when the cartilage at the affected joint wears down. Cartilage acts as a cushion, covering the ends of bones where they meet to form a joint. Osteoarthritis is the most common form of arthritis. It often occurs in older people. The joints affected most often by this condition include those in the:  Ends of the fingers.  Thumbs.  Neck.  Lower back.  Knees.  Hips. CAUSES  Over time, the cartilage that covers the ends of bones begins to wear away. This causes bone to rub on bone, producing pain and stiffness in the affected joints.  RISK FACTORS Certain factors can increase your chances of having osteoarthritis, including:  Older age.  Excessive body weight.  Overuse of joints. SIGNS AND SYMPTOMS   Pain, swelling, and stiffness in the joint.  Over time, the joint may lose its normal shape.  Small deposits of bone (osteophytes) may grow on the edges of the joint.  Bits of bone or cartilage can break off and float inside the joint space. This may cause more pain and damage. DIAGNOSIS  Your health care provider will do a physical exam and ask about your symptoms. Various tests may be ordered, such as:  X-rays of the affected joint.  An MRI scan.  Blood tests to rule out other types of arthritis.  Joint fluid tests. This involves using a needle to draw fluid from the joint and examining the fluid under a microscope. TREATMENT  Goals of treatment are to control pain and improve joint function. Treatment plans may include:  A prescribed exercise program that allows for rest and joint relief.  A weight control plan.  Pain relief techniques, such as:  Properly applied heat and cold.  Electric pulses delivered to nerve endings under the skin (transcutaneous electrical nerve stimulation, TENS).  Massage.  Certain nutritional supplements.  Medicines to  control pain, such as:  Acetaminophen.  Nonsteroidal anti-inflammatory drugs (NSAIDs), such as naproxen.  Narcotic or central-acting agents, such as tramadol.  Corticosteroids. These can be given orally or as an injection.  Surgery to reposition the bones and relieve pain (osteotomy) or to remove loose pieces of bone and cartilage. Joint replacement may be needed in advanced states of osteoarthritis. HOME CARE INSTRUCTIONS   Only take over-the-counter or prescription medicines as directed by your health care provider. Take all medicines exactly as instructed.  Maintain a healthy weight. Follow your health care provider's instructions for weight control. This may include dietary instructions.  Exercise as directed. Your health care provider can recommend specific types of exercise. These may include:  Strengthening exercises--These are done to strengthen the muscles that support joints affected by arthritis. They can be performed with weights or with exercise bands to add resistance.  Aerobic activities--These are exercises, such as brisk walking or low-impact aerobics, that get your heart pumping.  Range-of-motion activities--These keep your joints limber.  Balance and agility exercises--These help you maintain daily living skills.  Rest your affected joints as directed by your health care provider.  Follow up with your health care provider as directed. SEEK MEDICAL CARE IF:   Your skin turns red.  You develop a rash in addition to your joint pain.  You have worsening joint pain. SEEK IMMEDIATE MEDICAL CARE IF:  You have a significant loss of weight or appetite.  You have a fever along with joint or muscle aches.  You have night sweats. FOR MORE  INFORMATION  [employer] of Arthritis and Musculoskeletal and Skin Diseases: www.niams.http://www.myers.net/ [employer] on Aging: https://walker.com/ American College of Rheumatology: www.rheumatology.org Document Released:  12/17/2005 Document Revised: 10/07/2013 Document Reviewed: 08/24/2013 [facility] Patient Information 2015 [place], [place]. This information is not intended to replace advice given to you by your health care provider. Make sure you discuss any questions you have with your health care provider.    Use the medicine prescribed for pain relief.  Do not drive within 4 hours of taking this medicine as it will make you sleepy.  Call your doctor for a recheck if not improving over the next 7-10 days.  You may need to see an orthopedist if your symptoms persist (see the referral to Dr. Hilda Lias above).  Add the muscle relaxer as well as this may be beneficial.  Apply a heating pad to your shoulder for 20 minutes several times daily.

## 2014-06-24 ENCOUNTER — Ambulatory Visit (INDEPENDENT_AMBULATORY_CARE_PROVIDER_SITE_OTHER): Payer: Self-pay | Admitting: *Deleted

## 2014-06-24 DIAGNOSIS — Z7901 Long term (current) use of anticoagulants: Secondary | ICD-10-CM

## 2014-06-24 DIAGNOSIS — I6350 Cerebral infarction due to unspecified occlusion or stenosis of unspecified cerebral artery: Secondary | ICD-10-CM

## 2014-06-24 DIAGNOSIS — I238 Other current complications following acute myocardial infarction: Secondary | ICD-10-CM

## 2014-06-24 DIAGNOSIS — Z5181 Encounter for therapeutic drug level monitoring: Secondary | ICD-10-CM

## 2014-06-24 LAB — POCT INR: INR: 2

## 2014-06-25 NOTE — ED Provider Notes (Signed)
CSN: [id number]     Arrival date & time 06/23/14  1342 History   First MD Initiated Contact with Patient 06/23/14 1349     Chief Complaint  Patient presents with  . Shoulder Pain     (Consider location/radiation/quality/duration/timing/severity/associated sxs/prior Treatment) HPI Comments: Steve Vang is a 48 y.o. Male with a several month history of aching pain in his left shoulder which is worsened with movement and certain positions,  Especially with attempts to raise the arm over his head.  He denies injury.  He has intermittent episodes of tingling and numbness in the arm which is triggered by repetitive movements.  He denies weakness in the arm or hand and also denies neck pain or injury.  He also denies chest pain, shortness of breath or weakness.   He has not discussed this complaint with his pcp and has not tried any medicines or treatments prior to arrival.      The history is provided by the patient.    Past Medical History  Diagnosis Date  . CAD (coronary artery disease)     Multivessel, LVEF 45-50%  . Cerebrovascular disease     Stroke 2004  . Hyperlipidemia   . Hypertension   . Left ventricular mural thrombus     Apical  . Depression    Past Surgical History  Procedure Laterality Date  . Coronary artery bypass graft      DOR anterior ventricular [facility] [place] to first diagonal , SVG to PLB, SVG to RVE branch of nondominant RCA  . Eye surgery    . Valve replacement     Family History  Problem Relation Age of Onset  . Hypertension Mother   . Diabetes Mother    History  Substance Use Topics  . Smoking status: Current Every Day Smoker -- 1.50 packs/day    Types: Cigarettes  . Smokeless tobacco: Never Used  . Alcohol Use: 1.2 oz/week    2 Cans of beer per week     Comment: 1-2 beers a day     Review of Systems  Constitutional: Negative for fever.  HENT: Negative for congestion and sore throat.   Eyes: Negative.    Respiratory: Negative for chest tightness and shortness of breath.   Cardiovascular: Negative for chest pain.  Gastrointestinal: Negative for nausea and abdominal pain.  Genitourinary: Negative.   Musculoskeletal: Positive for arthralgias. Negative for joint swelling and neck pain.  Skin: Negative.  Negative for rash and wound.  Neurological: Positive for numbness. Negative for dizziness, weakness, light-headedness and headaches.  Psychiatric/Behavioral: Negative.       Allergies  Review of patient's allergies indicates no known allergies.  Home Medications   Prior to Admission medications   Medication Sig Start Date End Date Taking? Authorizing Provider  aspirin 81 MG tablet Take 81 mg by mouth daily.     Yes Historical Provider, MD  citalopram (CELEXA) 20 MG tablet Take 20 mg by mouth every evening.    Yes Historical Provider, MD  lisinopril (PRINIVIL,ZESTRIL) 20 MG tablet Take 1 tablet (20 mg total) by mouth daily. 02/17/14  Yes Jodelle GrossKathryn M Lawrence, NP  lovastatin (MEVACOR) 20 MG tablet Take 1 tablet (20 mg total) by mouth at bedtime. 02/17/14  Yes Jodelle GrossKathryn M Lawrence, NP  metoprolol tartrate (LOPRESSOR) 25 MG tablet Take 1 tablet (25 mg total) by mouth 2 (two) times daily. 10/20/13  Yes Jodelle GrossKathryn M Lawrence, NP  warfarin (COUMADIN) 5 MG tablet 1 tablet daily  except 1 1/2 tablets on Mondays, Wednesdays and Fridays or as directed. 02/18/14  Yes Jodelle Gross, NP  cyclobenzaprine (FLEXERIL) 5 MG tablet Take 1 tablet (5 mg total) by mouth 3 (three) times daily as needed for muscle spasms. 06/23/14   Burgess Amor, PA-C  nitroGLYCERIN (NITROSTAT) 0.4 MG SL tablet Place 1 tablet (0.4 mg total) under the tongue every 5 (five) minutes as needed. 08/07/13   Jodelle Gross, NP  oxyCODONE-acetaminophen (PERCOCET/ROXICET) 5-325 MG per tablet Take 1 tablet by mouth every 4 (four) hours as needed for moderate pain. 06/23/14   Burgess Amor, PA-C   BP 106/50  Pulse 70  Temp(Src) 97.7 F (36.5 C)  (Oral)  Resp 20  Ht 5\' 5"  (1.651 m)  Wt 161 lb (73.029 kg)  BMI 26.79 kg/m2  SpO2 99% Physical Exam  Constitutional: He appears well-developed and well-nourished.  HENT:  Head: Atraumatic.  Neck: Normal range of motion.  Cardiovascular: Normal rate.   Pulses equal bilaterally  Pulmonary/Chest: Effort normal and breath sounds normal.  Musculoskeletal: He exhibits tenderness. He exhibits no edema.       Left shoulder: He exhibits bony tenderness. He exhibits no swelling, no effusion, no crepitus, no deformity, normal pulse and normal strength.  ttp at ac joint.  Equal grip strength.  Sensation in upper extremities normal.  Pain at the ac joint with resisted flexion and abduction.  Neurological: He is alert. He has normal strength. He displays normal reflexes. No sensory deficit.  Reflex Scores:      Bicep reflexes are 1+ on the right side and 1+ on the left side. Skin: Skin is warm and dry.  Psychiatric: He has a normal mood and affect.    ED Course  Procedures (including critical care time) Labs Review Labs Reviewed - No data to display  Imaging Review Dg Shoulder Left  06/23/2014   CLINICAL DATA:  LEFT shoulder pain, no injury  EXAM: LEFT SHOULDER - 2+ VIEW  COMPARISON:  None  FINDINGS: Degenerative changes AC joint with joint space narrowing.  Osseous mineralization otherwise normal.  Post median sternotomy and CABG.  No acute fracture, dislocation or bone destruction.  Visualized LEFT ribs intact.  IMPRESSION: No acute osseous abnormalities.  Mild degenerative changes LEFT AC joint.   Electronically Signed   By: Ulyses Southward M.D.   On: 06/23/2014 14:56     EKG Interpretation None      MDM   Final diagnoses:  Degenerative joint disease of acromioclavicular joint    Patients labs and/or radiological studies were viewed and considered during the medical decision making and disposition process. Pt without neuro sx today,  But describes history of intermittent sx suggestive  of possible nerve impingement.  No c spine pain or hx of trauma.  He was prescribed hydrocodone and flexeril,  Defer nsaid given coumadin use.  Advised heat tx.  F/u with pcp.  Advised may need ortho f/u - given referral.  The patient appears reasonably screened and/or stabilized for discharge and I doubt any other medical condition or other [facility] requiring further screening, evaluation, or treatment in the ED at this time prior to discharge.     Burgess Amor, PA-C 06/25/14 [phone]

## 2014-07-04 NOTE — ED Provider Notes (Signed)
Medical screening examination/treatment/procedure(s) were performed by non-physician practitioner and as supervising physician I was immediately available for consultation/collaboration.   EKG Interpretation None       Donnetta Hutching, MD 07/04/14 2032

## 2014-07-18 ENCOUNTER — Encounter (HOSPITAL_COMMUNITY): Payer: Self-pay | Admitting: Emergency Medicine

## 2014-07-18 ENCOUNTER — Emergency Department (HOSPITAL_COMMUNITY)
Admission: EM | Admit: 2014-07-18 | Discharge: 2014-07-18 | Disposition: A | Discharge status: Home / Self Care | Payer: Self-pay | Attending: Emergency Medicine | Admitting: Emergency Medicine

## 2014-07-18 DIAGNOSIS — I2510 Atherosclerotic heart disease of native coronary artery without angina pectoris: Secondary | ICD-10-CM | POA: Insufficient documentation

## 2014-07-18 DIAGNOSIS — E785 Hyperlipidemia, unspecified: Secondary | ICD-10-CM | POA: Insufficient documentation

## 2014-07-18 DIAGNOSIS — F3289 Other specified depressive episodes: Secondary | ICD-10-CM | POA: Insufficient documentation

## 2014-07-18 DIAGNOSIS — Z951 Presence of aortocoronary bypass graft: Secondary | ICD-10-CM | POA: Insufficient documentation

## 2014-07-18 DIAGNOSIS — B86 Scabies: Secondary | ICD-10-CM | POA: Insufficient documentation

## 2014-07-18 DIAGNOSIS — M25519 Pain in unspecified shoulder: Secondary | ICD-10-CM | POA: Insufficient documentation

## 2014-07-18 DIAGNOSIS — Z7901 Long term (current) use of anticoagulants: Secondary | ICD-10-CM | POA: Insufficient documentation

## 2014-07-18 DIAGNOSIS — I10 Essential (primary) hypertension: Secondary | ICD-10-CM | POA: Insufficient documentation

## 2014-07-18 DIAGNOSIS — F17200 Nicotine dependence, unspecified, uncomplicated: Secondary | ICD-10-CM | POA: Insufficient documentation

## 2014-07-18 DIAGNOSIS — F329 Major depressive disorder, single episode, unspecified: Secondary | ICD-10-CM | POA: Insufficient documentation

## 2014-07-18 DIAGNOSIS — Z7982 Long term (current) use of aspirin: Secondary | ICD-10-CM | POA: Insufficient documentation

## 2014-07-18 DIAGNOSIS — Z8673 Personal history of transient ischemic attack (TIA), and cerebral infarction without residual deficits: Secondary | ICD-10-CM | POA: Insufficient documentation

## 2014-07-18 DIAGNOSIS — G8929 Other chronic pain: Secondary | ICD-10-CM | POA: Insufficient documentation

## 2014-07-18 DIAGNOSIS — Z79899 Other long term (current) drug therapy: Secondary | ICD-10-CM | POA: Insufficient documentation

## 2014-07-18 MED ORDER — DEXAMETHASONE SODIUM PHOSPHATE 4 MG/ML IJ SOLN
8.0000 mg | Freq: Once | INTRAMUSCULAR | Status: AC
  Administered 2014-07-18: 8 mg via INTRAMUSCULAR
  Filled 2014-07-18: qty 2

## 2014-07-18 MED ORDER — CETIRIZINE HCL 10 MG PO TABS: 10.0000 mg | ORAL_TABLET | Freq: Every day | ORAL | Status: DC

## 2014-07-18 MED ORDER — DEXAMETHASONE SODIUM PHOSPHATE 4 MG/ML IJ SOLN: 8.0000 mg | Freq: Once | INTRAMUSCULAR | Status: DC

## 2014-07-18 MED ORDER — PERMETHRIN 5 % EX CREA: TOPICAL_CREAM | CUTANEOUS | Status: DC

## 2014-07-18 MED ORDER — PREDNISONE 10 MG PO TABS: 20.0000 mg | ORAL_TABLET | Freq: Two times a day (BID) | ORAL | Status: DC

## 2014-07-18 NOTE — Discharge Instructions (Signed)
We are treating you with medications for your rash and itching. If the rash does not improve follow up with Dr. Margo Aye, the dermatologist. When you call tell them you would like an appointment in the [place] office. Return here as needed.

## 2014-07-18 NOTE — ED Provider Notes (Signed)
CSN: [id number]     Arrival date & time 07/18/14  4098 History   First MD Initiated Contact with Patient 07/18/14 [phone]     Chief Complaint  Patient presents with  . Rash     (Consider location/radiation/quality/duration/timing/severity/associated sxs/prior Treatment) Patient is a 48 y.o. male presenting with rash. The history is provided by the patient.  Rash Location:  Full body Quality: dryness, itchiness and redness   Severity:  Severe Onset quality:  Gradual Duration:  3 weeks Timing:  Constant Progression:  Spreading Chronicity:  New Context: not eggs, not exposure to similar rash, not insect bite/sting, not plant contact and not sick contacts   Relieved by:  Nothing Worsened by:  Rosalita Chessman is a 48 y.o. male who presents to the ED with full body rash that started a few weeks ago. He complains of itching. He has not been around anyone that has had a rash. He denies fever or chills, n/v, shortness of breath or difficulty swallowing. He has chronic left should pain that he is taking Neurontin for but had the rash a week before he started the medication. The rash itches. He has not taken anything due to his allergy to Benadryl and the fact that he has a "blood clot in my heart". He goes out in the yard a lot but has not been around anything that he thinks he is allergic to. He has not started any new medication since the rash started. PMH significant for CAD, HTN, Left ventricular mural thrombus, and depression.  Past Medical History  Diagnosis Date  . CAD (coronary artery disease)     Multivessel, LVEF 45-50%  . Cerebrovascular disease     Stroke 2004  . Hyperlipidemia   . Hypertension   . Left ventricular mural thrombus     Apical  . Depression    Past Surgical History  Procedure Laterality Date  . Coronary artery bypass graft      DOR anterior ventricular restoration surgery 8/06, [facility] [place] to first diagonal , SVG to PLB, SVG to RVE branch of  nondominant RCA  . Eye surgery    . Valve replacement     Family History  Problem Relation Age of Onset  . Hypertension Mother   . Diabetes Mother    History  Substance Use Topics  . Smoking status: Current Every Day Smoker -- 1.50 packs/day    Types: Cigarettes  . Smokeless tobacco: Never Used  . Alcohol Use: 1.2 oz/week    2 Cans of beer per week     Comment: 1-2 beers a day     Review of Systems  Musculoskeletal:       Chronic left shoulder pain.  Skin: Positive for rash.  all other systems negative  Allergies  Benadryl  Home Medications   Prior to Admission medications   Medication Sig Start Date End Date Taking? Authorizing Provider  aspirin 81 MG tablet Take 81 mg by mouth daily.      Historical Provider, MD  citalopram (CELEXA) 20 MG tablet Take 20 mg by mouth every evening.     Historical Provider, MD  cyclobenzaprine (FLEXERIL) 5 MG tablet Take 1 tablet (5 mg total) by mouth 3 (three) times daily as needed for muscle spasms. 06/23/14   Burgess Amor, PA-C  lisinopril (PRINIVIL,ZESTRIL) 20 MG tablet Take 1 tablet (20 mg total) by mouth daily. 02/17/14   Jodelle Gross, NP  lovastatin (MEVACOR) 20 MG tablet Take 1 tablet (  20 mg total) by mouth at bedtime. 02/17/14   Jodelle GrossKathryn M Lawrence, NP  metoprolol tartrate (LOPRESSOR) 25 MG tablet Take 1 tablet (25 mg total) by mouth 2 (two) times daily. 10/20/13   Jodelle GrossKathryn M Lawrence, NP  nitroGLYCERIN (NITROSTAT) 0.4 MG SL tablet Place 1 tablet (0.4 mg total) under the tongue every 5 (five) minutes as needed. 08/07/13   Jodelle GrossKathryn M Lawrence, NP  oxyCODONE-acetaminophen (PERCOCET/ROXICET) 5-325 MG per tablet Take 1 tablet by mouth every 4 (four) hours as needed for moderate pain. 06/23/14   Burgess AmorJulie Idol, PA-C  warfarin (COUMADIN) 5 MG tablet 1 tablet daily except 1 1/2 tablets on Mondays, Wednesdays and Fridays or as directed. 02/18/14   Jodelle GrossKathryn M Lawrence, NP   BP 128/76  Pulse 67  Temp(Src) 97.8 F (36.6 C)  Resp 20  Ht 5\' 6"  (1.676  m)  Wt 161 lb (73.029 kg)  BMI 26.00 kg/m2  SpO2 98% Physical Exam  Nursing note and vitals reviewed. Constitutional: He is oriented to person, place, and time. He appears well-developed and well-nourished.  HENT:  Head: Normocephalic.  Eyes: EOM are normal.  Neck: Neck supple.  Cardiovascular: Normal rate.   Pulmonary/Chest: Effort normal.  Abdominal: Soft. There is no tenderness.  Musculoskeletal: Normal range of motion.  Neurological: He is alert and oriented to person, place, and time. No cranial nerve deficit.  Skin: Skin is warm and dry.  There is a rash noted to the webb spaces of the fingers, axilla, trunk, genital area, arms, and a few areas to the legs.   Psychiatric: He has a normal mood and affect. His behavior is normal.    ED Course  Procedures   MDM  48 y.o. male with rash and severe itching that has spread over the past 3 weeks. Will treat for scabies and itching. Dermatology referral given for worsening symptoms. Discussed with the patient clinical findings and plan of care. All questioned fully answered. He voices understanding and agrees with plan.    Medication List    TAKE these medications       cetirizine 10 MG tablet  Commonly known as:  ZYRTEC  Take 1 tablet (10 mg total) by mouth daily.     permethrin 5 % cream  Commonly known as:  ELIMITE  Apply to affected area once     predniSONE 10 MG tablet  Commonly known as:  DELTASONE  Take 2 tablets (20 mg total) by mouth 2 (two) times daily with a meal.      ASK your doctor about these medications       aspirin 81 MG tablet  Take 81 mg by mouth daily.     citalopram 20 MG tablet  Commonly known as:  CELEXA  Take 20 mg by mouth every evening.     cyclobenzaprine 5 MG tablet  Commonly known as:  FLEXERIL  Take 1 tablet (5 mg total) by mouth 3 (three) times daily as needed for muscle spasms.     lisinopril 20 MG tablet  Commonly known as:  PRINIVIL,ZESTRIL  Take 1 tablet (20 mg total) by  mouth daily.     lovastatin 20 MG tablet  Commonly known as:  MEVACOR  Take 1 tablet (20 mg total) by mouth at bedtime.     metoprolol tartrate 25 MG tablet  Commonly known as:  LOPRESSOR  Take 1 tablet (25 mg total) by mouth 2 (two) times daily.     nitroGLYCERIN 0.4 MG SL tablet  Commonly known as:  NITROSTAT  Place 1 tablet (0.4 mg total) under the tongue every 5 (five) minutes as needed.     oxyCODONE-acetaminophen 5-325 MG per tablet  Commonly known as:  PERCOCET/ROXICET  Take 1 tablet by mouth every 4 (four) hours as needed for moderate pain.     warfarin 5 MG tablet  Commonly known as:  COUMADIN  1 tablet daily except 1 1/2 tablets on Mondays, Wednesdays and Fridays or as directed.         Janne Napoleon, [place] 07/18/14 2059

## 2014-07-18 NOTE — ED Notes (Signed)
Pt has rash noted to most of his body area, c/o itching, redness. States that the rash started a few weeks ago, thought that it had gotten better but then started getting worse again.

## 2014-07-18 NOTE — ED Notes (Signed)
Complain of thick red raised rash for weeks

## 2014-07-19 NOTE — ED Provider Notes (Signed)
History/physical exam/procedure(s) were performed by non-physician practitioner and as supervising physician I was immediately available for consultation/collaboration. I have reviewed all notes and am in agreement with care and plan.   Hilario Quarry, MD 07/19/14 1011

## 2014-07-22 ENCOUNTER — Ambulatory Visit (INDEPENDENT_AMBULATORY_CARE_PROVIDER_SITE_OTHER): Payer: Self-pay | Admitting: *Deleted

## 2014-07-22 DIAGNOSIS — Z7901 Long term (current) use of anticoagulants: Secondary | ICD-10-CM

## 2014-07-22 DIAGNOSIS — Z5181 Encounter for therapeutic drug level monitoring: Secondary | ICD-10-CM

## 2014-07-22 DIAGNOSIS — I6350 Cerebral infarction due to unspecified occlusion or stenosis of unspecified cerebral artery: Secondary | ICD-10-CM

## 2014-07-22 DIAGNOSIS — I238 Other current complications following acute myocardial infarction: Secondary | ICD-10-CM

## 2014-07-22 LAB — POCT INR: INR: 4.2

## 2014-08-05 ENCOUNTER — Ambulatory Visit (INDEPENDENT_AMBULATORY_CARE_PROVIDER_SITE_OTHER): Payer: Self-pay | Admitting: *Deleted

## 2014-08-05 DIAGNOSIS — I6350 Cerebral infarction due to unspecified occlusion or stenosis of unspecified cerebral artery: Secondary | ICD-10-CM

## 2014-08-05 DIAGNOSIS — Z7901 Long term (current) use of anticoagulants: Secondary | ICD-10-CM

## 2014-08-05 DIAGNOSIS — Z5181 Encounter for therapeutic drug level monitoring: Secondary | ICD-10-CM

## 2014-08-05 DIAGNOSIS — I238 Other current complications following acute myocardial infarction: Secondary | ICD-10-CM

## 2014-08-05 LAB — POCT INR: INR: 2.9

## 2014-09-02 ENCOUNTER — Ambulatory Visit (INDEPENDENT_AMBULATORY_CARE_PROVIDER_SITE_OTHER): Payer: Self-pay | Admitting: Pharmacist

## 2014-09-02 DIAGNOSIS — I6350 Cerebral infarction due to unspecified occlusion or stenosis of unspecified cerebral artery: Secondary | ICD-10-CM

## 2014-09-02 DIAGNOSIS — Z7901 Long term (current) use of anticoagulants: Secondary | ICD-10-CM

## 2014-09-02 DIAGNOSIS — I238 Other current complications following acute myocardial infarction: Secondary | ICD-10-CM

## 2014-09-02 DIAGNOSIS — Z5181 Encounter for therapeutic drug level monitoring: Secondary | ICD-10-CM

## 2014-09-02 LAB — POCT INR: INR: 1.8

## 2014-09-03 ENCOUNTER — Telehealth: Payer: Self-pay | Admitting: Cardiology

## 2014-09-03 MED ORDER — NITROGLYCERIN 0.4 MG SL SUBL: 0.4000 mg | SUBLINGUAL_TABLET | SUBLINGUAL | Status: DC | PRN

## 2014-09-03 NOTE — Telephone Encounter (Signed)
Needs new RX for NTG sent to [facility] / [facility]

## 2014-09-03 NOTE — Telephone Encounter (Signed)
Refill complete 

## 2014-09-12 ENCOUNTER — Other Ambulatory Visit: Payer: Self-pay | Admitting: Adult Health

## 2014-09-22 ENCOUNTER — Ambulatory Visit (INDEPENDENT_AMBULATORY_CARE_PROVIDER_SITE_OTHER): Payer: Self-pay | Admitting: *Deleted

## 2014-09-22 DIAGNOSIS — Z5181 Encounter for therapeutic drug level monitoring: Secondary | ICD-10-CM

## 2014-09-22 DIAGNOSIS — Z7901 Long term (current) use of anticoagulants: Secondary | ICD-10-CM

## 2014-09-22 DIAGNOSIS — I6350 Cerebral infarction due to unspecified occlusion or stenosis of unspecified cerebral artery: Secondary | ICD-10-CM

## 2014-09-22 DIAGNOSIS — I238 Other current complications following acute myocardial infarction: Secondary | ICD-10-CM

## 2014-09-22 LAB — POCT INR: INR: 3

## 2014-09-28 ENCOUNTER — Encounter: Payer: Self-pay | Admitting: Adult Health

## 2014-09-28 ENCOUNTER — Ambulatory Visit (INDEPENDENT_AMBULATORY_CARE_PROVIDER_SITE_OTHER): Payer: Self-pay | Admitting: Adult Health

## 2014-09-28 VITALS — BP 132/70 | HR 74 | Ht 67.0 in | Wt 175.0 lb

## 2014-09-28 DIAGNOSIS — F17200 Nicotine dependence, unspecified, uncomplicated: Secondary | ICD-10-CM

## 2014-09-28 DIAGNOSIS — I10 Essential (primary) hypertension: Secondary | ICD-10-CM

## 2014-09-28 DIAGNOSIS — E782 Mixed hyperlipidemia: Secondary | ICD-10-CM

## 2014-09-28 DIAGNOSIS — I6350 Cerebral infarction due to unspecified occlusion or stenosis of unspecified cerebral artery: Secondary | ICD-10-CM

## 2014-09-28 DIAGNOSIS — I2510 Atherosclerotic heart disease of native coronary artery without angina pectoris: Secondary | ICD-10-CM

## 2014-09-28 NOTE — Assessment & Plan Note (Addendum)
He said no focal neurological changes. He is taking Coumadin as directed and is being followed in our Coumadin clinic in [place]. I will  check a CBC. He does not have a primary care physician and therefore annual  are being monitored by our practice

## 2014-09-28 NOTE — Progress Notes (Signed)
HPI: [name] is a 48 year old patient of Dr. Diona BrownerMcDowell that we are following for ongoing assessment and management of CAD, history of CABG, hypertension, CVA, on chronic anticoagulation, and ongoing tobacco abuse. The patient was last seen in the office on 10/07/2013 with continued complaints of headaches. No recurrent chest pain. No changes are made in his medication regimen. He is here for annual followup.  He assay, without any cardiac complaints. He has been to the ER twice since seeing him last. Once for a poison ivy outbreak, another for pain in his left shoulder. He does not have a primary care physician, has had no further testing on the shoulder even of this causing him a great deal of pain. He denies any new surgeries, new medicines, new allergies. He is medically compliant. He sees us in our Coumadin clinic for ongoing management of dosing.  Allergies  Allergen Reactions  . Benadryl [Diphenhydramine]     Current Outpatient Prescriptions  Medication Sig Dispense Refill  . aspirin 81 MG tablet Take 81 mg by mouth daily.        . cetirizine (ZYRTEC) 10 MG tablet Take 1 tablet (10 mg total) by mouth daily.  14 tablet  0  . citalopram (CELEXA) 20 MG tablet Take 20 mg by mouth every evening.       . cyclobenzaprine (FLEXERIL) 5 MG tablet Take 1 tablet (5 mg total) by mouth 3 (three) times daily as needed for muscle spasms.  30 tablet  0  . lisinopril (PRINIVIL,ZESTRIL) 20 MG tablet Take 1 tablet (20 mg total) by mouth daily.  90 tablet  1  . lovastatin (MEVACOR) 20 MG tablet Take 1 tablet (20 mg total) by mouth at bedtime.  30 tablet  5  . metoprolol tartrate (LOPRESSOR) 25 MG tablet Take 1 tablet (25 mg total) by mouth 2 (two) times daily.  180 tablet  1  . nitroGLYCERIN (NITROSTAT) 0.4 MG SL tablet Place 1 tablet (0.4 mg total) under the tongue every 5 (five) minutes as needed.  25 tablet  3  . oxyCODONE-acetaminophen (PERCOCET/ROXICET) 5-325 MG per tablet Take 1 tablet by mouth  every 4 (four) hours as needed for moderate pain.  20 tablet  0  . permethrin (ELIMITE) 5 % cream Apply to affected area once  60 g  0  . warfarin (COUMADIN) 5 MG tablet TAKE ONE TABLET BY MOUTH ONCE DAILY EXCEPT TAKE 1  &  1/2  TABLETS  BY  MOUTH  ON  MONDAYS,  WEDNESDAYS,  AND  FRIDAYS  OR  AS  DIRECTED  45 tablet  3   No current facility-administered medications for this visit.    Past Medical History  Diagnosis Date  . CAD (coronary artery disease)     Multivessel, LVEF 45-50%  . Cerebrovascular disease     Stroke 2004  . Hyperlipidemia   . Hypertension   . Left ventricular mural thrombus     Apical  . Depression     Past Surgical History  Procedure Laterality Date  . Coronary artery bypass graft      DOR anterior ventricular restoration surgery 8/06, [facility] [place] to first diagonal , SVG to PLB, SVG to RVE branch of nondominant RCA  . Eye surgery    . Valve replacement      ROS: Review of systems complete and found to be negative unless listed above  PHYSICAL EXAM BP 132/70  Pulse 74  Ht 5\' 7"  (1.702 m)  Wt  175 lb (79.379 kg)  BMI 27.40 kg/m2 General: Well developed, well nourished, in no acute distress Head: Eyes PERRLA, No xanthomas.   Normal cephalic and atramatic  Lungs: Clear bilaterally to auscultation and percussion. Heart: HRRR S1 S2, without MRG.  Pulses are 2+ & equal.            No carotid bruit. No JVD.  No abdominal bruits. No femoral bruits. Abdomen: Bowel sounds are positive, abdomen soft and non-tender without masses or                  Hernia's noted. Msk:  Back normal, normal gait. Normal strength and tone for age. Extremities: No clubbing, cyanosis or edema.  DP +1 pain with movement of the left shoulder and arm. Neuro: Alert and oriented X 3. Psych:  Good affect, responds appropriately   EKG:  Normal sinus rhythm, septal, and lateral Q waves are noted. Heart rate of 74 beats per minute  ASSESSMENT AND PLAN

## 2014-09-28 NOTE — Assessment & Plan Note (Signed)
Fasting lipids and LFTs are being ordered.

## 2014-09-28 NOTE — Assessment & Plan Note (Signed)
He continues to be asymptomatic, he states he has walked up to 5-10 miles a day with his work, has had no angina symptoms, shortness of breath, or weakness. He is medically compliant. Will check lipids LFTs for evaluation of status. He will continue his current medication regimen without changes. No plans to repeat any testing at this time unless he becomes symptomatic.

## 2014-09-28 NOTE — Assessment & Plan Note (Signed)
He unfortunately continues to smoke. He has had multiple smoking cessation talks there are this. He does not appear willing her ready to quit at this time

## 2014-09-28 NOTE — Assessment & Plan Note (Signed)
Blood pressure is well-controlled. No changes in his medication regimen at this time.

## 2014-09-28 NOTE — Patient Instructions (Signed)
Your physician wants you to follow-up in: 1 year You will receive a reminder letter in the mail two months in advance. If you don't receive a letter, please call our office to schedule the follow-up appointment.      Your physician recommends that you continue on your current medications as directed. Please refer to the Current Medication list given to you today.    Please get blood work FASTING (LFT's,LIPID,BMET)     Thank you for choosing [facility] !

## 2014-09-28 NOTE — Progress Notes (Deleted)
Name: Steve Vang    DOB: [birth year]  Age: 48 y.o.  MR#: [id number]       PCP:  Raiford Simmonds., PA-C      Insurance: Payor: / No coverage found.  CC:    Chief Complaint  Patient presents with  . Coronary Artery Disease    CABG  . Hypertension  . Cerebrovascular Accident    On Coumadin    VS Filed Vitals:   09/28/14 1310  BP: 132/70  Pulse: 74  Height: 5' 7"  (1.702 m)  Weight: 175 lb (79.379 kg)    Weights Current Weight  09/28/14 175 lb (79.379 kg)  07/18/14 161 lb (73.029 kg)  06/23/14 161 lb (73.029 kg)    Blood Pressure  BP Readings from Last 3 Encounters:  09/28/14 132/70  07/18/14 128/76  06/23/14 106/50     Admit date:  (Not on file) Last encounter with RMR:  09/12/2014   Allergy Benadryl  Current Outpatient Prescriptions  Medication Sig Dispense Refill  . aspirin 81 MG tablet Take 81 mg by mouth daily.        . cetirizine (ZYRTEC) 10 MG tablet Take 1 tablet (10 mg total) by mouth daily.  14 tablet  0  . citalopram (CELEXA) 20 MG tablet Take 20 mg by mouth every evening.       . cyclobenzaprine (FLEXERIL) 5 MG tablet Take 1 tablet (5 mg total) by mouth 3 (three) times daily as needed for muscle spasms.  30 tablet  0  . lisinopril (PRINIVIL,ZESTRIL) 20 MG tablet Take 1 tablet (20 mg total) by mouth daily.  90 tablet  1  . lovastatin (MEVACOR) 20 MG tablet Take 1 tablet (20 mg total) by mouth at bedtime.  30 tablet  5  . metoprolol tartrate (LOPRESSOR) 25 MG tablet Take 1 tablet (25 mg total) by mouth 2 (two) times daily.  180 tablet  1  . nitroGLYCERIN (NITROSTAT) 0.4 MG SL tablet Place 1 tablet (0.4 mg total) under the tongue every 5 (five) minutes as needed.  25 tablet  3  . oxyCODONE-acetaminophen (PERCOCET/ROXICET) 5-325 MG per tablet Take 1 tablet by mouth every 4 (four) hours as needed for moderate pain.  20 tablet  0  . permethrin (ELIMITE) 5 % cream Apply to affected area once  60 g  0  . warfarin (COUMADIN) 5 MG tablet TAKE ONE TABLET BY MOUTH ONCE  DAILY EXCEPT TAKE 1  &  1/2  TABLETS  BY  MOUTH  ON  MONDAYS,  WEDNESDAYS,  AND  FRIDAYS  OR  AS  DIRECTED  45 tablet  3   No current facility-administered medications for this visit.    Discontinued Meds:    Medications Discontinued During This Encounter  Medication Reason  . predniSONE (DELTASONE) 10 MG tablet Error    Patient Active Problem List   Diagnosis Date Noted  . Encounter for therapeutic drug monitoring 03/03/2014  . Encounter for long-term (current) use of anticoagulants 04/16/2011  . GAIT IMBALANCE 02/02/2011  . ABNORMAL CV (STRESS) TEST 10/02/2010  . MURAL THROMBUS, LEFT VENTRICLE 09/08/2010  . OTHER SPEC FORMS CHRONIC ISCHEMIC HEART DISEASE 06/07/2010  . Mixed hyperlipidemia 03/02/2010  . TOBACCO ABUSE 03/02/2010  . ESSENTIAL HYPERTENSION, BENIGN 03/02/2010  . CORONARY ATHEROSCLEROSIS NATIVE CORONARY ARTERY 03/02/2010  . CVA 03/01/2010    LABS    Component Value Date/Time   NA 137 07/07/2013 1504   NA 141 09/04/2010 0434   NA 137 09/03/2010 1752   K 4.7  07/07/2013 1504   K 4.6 09/04/2010 0434   K 4.6 09/03/2010 1752   CL 100 07/07/2013 1504   CL 106 09/04/2010 0434   CL 104 09/03/2010 1752   CO2 29 07/07/2013 1504   CO2 31 09/04/2010 0434   CO2 26 09/03/2010 1752   GLUCOSE 115* 07/07/2013 1504   GLUCOSE 96 09/04/2010 0434   GLUCOSE 92 09/03/2010 1752   BUN 12 07/07/2013 1504   BUN 12 09/04/2010 0434   BUN 12 09/03/2010 1752   CREATININE 1.19 07/07/2013 1504   CREATININE 1.09 09/04/2010 0434   CREATININE 1.10 09/03/2010 1752   CREATININE 1.09 06/21/2010 1835   CALCIUM 10.1 07/07/2013 1504   CALCIUM 9.3 09/04/2010 0434   CALCIUM 9.3 09/03/2010 1752   GFRNONAA >60 09/04/2010 0434   GFRNONAA >60 09/03/2010 1752   GFRNONAA >60 04/27/2010 1113   GFRAA  Value: >60        The eGFR has been calculated using the MDRD equation. This calculation has not been validated in all clinical situations. eGFR's persistently <60 mL/min signify possible Chronic Kidney Disease. 09/04/2010 0434   GFRAA  Value: >60         The eGFR has been calculated using the MDRD equation. This calculation has not been validated in all clinical situations. eGFR's persistently <60 mL/min signify possible Chronic Kidney Disease. 09/03/2010 1752   GFRAA  Value: >60        The eGFR has been calculated using the MDRD equation. This calculation has not been validated in all clinical situations. eGFR's persistently <60 mL/min signify possible Chronic Kidney Disease. 04/27/2010 1113   CMP     Component Value Date/Time   NA 137 07/07/2013 1504   K 4.7 07/07/2013 1504   CL 100 07/07/2013 1504   CO2 29 07/07/2013 1504   GLUCOSE 115* 07/07/2013 1504   BUN 12 07/07/2013 1504   CREATININE 1.19 07/07/2013 1504   CREATININE 1.09 09/04/2010 0434   CALCIUM 10.1 07/07/2013 1504   PROT 6.9 03/28/2011 0829   ALBUMIN 4.3 03/28/2011 0829   AST 22 03/28/2011 0829   ALT 27 03/28/2011 0829   ALKPHOS 119* 03/28/2011 0829   BILITOT 0.4 03/28/2011 0829   GFRNONAA >60 09/04/2010 0434   GFRAA  Value: >60        The eGFR has been calculated using the MDRD equation. This calculation has not been validated in all clinical situations. eGFR's persistently <60 mL/min signify possible Chronic Kidney Disease. 09/04/2010 0434       Component Value Date/Time   WBC 6.2 09/04/2010 0434   WBC 6.5 09/03/2010 1752   WBC 9.3 04/27/2010 1113   HGB 14.5 09/04/2010 0434   HGB 14.7 09/03/2010 1752   HGB 16.1 04/27/2010 1113   HCT 42.0 09/04/2010 0434   HCT 43.5 09/03/2010 1752   HCT 45.7 04/27/2010 1113   MCV 90.3 09/04/2010 0434   MCV 92.5 09/03/2010 1752   MCV 91.7 04/27/2010 1113    Lipid Panel     Component Value Date/Time   CHOL 156 03/28/2011 0829   TRIG 100 03/28/2011 0829   HDL 40 03/28/2011 0829   CHOLHDL 3.9 03/28/2011 0829   VLDL 20 03/28/2011 0829   LDLCALC 96 03/28/2011 0829    ABG No results found for this basename: phart, pco2, pco2art, po2, po2art, hco3, tco2, acidbasedef, o2sat     Lab Results  Component Value Date   TSH 2.934 09/04/2010   BNP (last 3 results) No results found for  this  basename: PROBNP,  in the last 8760 hours Cardiac Panel (last 3 results) No results found for this basename: CKTOTAL, CKMB, TROPONINI, RELINDX,  in the last 72 hours  Iron/TIBC/Ferritin/ %Sat No results found for this basename: iron, tibc, ferritin, ironpctsat     EKG Orders placed in visit on 09/28/14  . EKG 12-LEAD     Prior Assessment and Plan Problem List as of 09/28/2014     Cardiovascular and Mediastinum   ESSENTIAL HYPERTENSION, BENIGN   Last Assessment & Plan   08/07/2013 Office Visit Written 08/07/2013  1:54 PM by Lendon Colonel, NP     Pressure is currently well controlled. No changes in medication. Advised to stay away from [facility] powders and BC powders.    CORONARY ATHEROSCLEROSIS NATIVE CORONARY ARTERY   Last Assessment & Plan   08/07/2013 Office Visit Written 08/07/2013  1:55 PM by Lendon Colonel, NP     He denies recurrent chest pain, dizziness, or dyspnea on exertion. Unfortunately he continues to smoke. Advised him concerning the risk factor and the need to stop smoking. He is also requesting a refill on nitroglycerin sublingual. He has not used it since his bypass surgery, but would like to make sure he has a fresh bottle. This is been provided    OTHER [facility] FORMS CHRONIC ISCHEMIC HEART DISEASE   MURAL THROMBUS, LEFT VENTRICLE   Last Assessment & Plan   06/27/2012 Office Visit Written 06/27/2012 12:39 PM by Lendon Colonel, NP     He remains on coumadin. I will check CBC and continue INR checks.     CVA   Last Assessment & Plan   07/07/2013 Office Visit Written 07/07/2013  3:05 PM by Lendon Colonel, NP     He has complaints of unilateral headache on the right side with tightness and pressure noted. 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 2 yesterday. He is taking a lot of goody powders for pain. I will have CT scan of the head to evaluate for bleed or other intracranial abnormality. BMET and Magnesium with tingling  in his hands.      Other   Mixed hyperlipidemia   Last Assessment & Plan   04/03/2011 Office Visit Written 04/03/2011  1:18 PM by Satira Sark, MD     Lipids are improving on statin therapy.    TOBACCO ABUSE   Last Assessment & Plan   06/27/2012 Office Visit Written 06/27/2012 12:37 PM by Lendon Colonel, NP     I have spoken to him again about smoking cessation and his known CAD. He appears sheepish but has no plans to quit at this time.    ABNORMAL CV (STRESS) TEST   GAIT IMBALANCE   Encounter for long-term (current) use of anticoagulants   Encounter for therapeutic drug monitoring       Imaging: No results found.

## 2014-10-14 ENCOUNTER — Other Ambulatory Visit: Payer: Self-pay | Admitting: Adult Health

## 2014-10-20 ENCOUNTER — Ambulatory Visit (INDEPENDENT_AMBULATORY_CARE_PROVIDER_SITE_OTHER): Payer: Self-pay | Admitting: *Deleted

## 2014-10-20 DIAGNOSIS — Z7901 Long term (current) use of anticoagulants: Secondary | ICD-10-CM

## 2014-10-20 DIAGNOSIS — Z5181 Encounter for therapeutic drug level monitoring: Secondary | ICD-10-CM

## 2014-10-20 DIAGNOSIS — I639 Cerebral infarction, unspecified: Secondary | ICD-10-CM

## 2014-10-20 DIAGNOSIS — I238 Other current complications following acute myocardial infarction: Secondary | ICD-10-CM

## 2014-10-20 DIAGNOSIS — I6350 Cerebral infarction due to unspecified occlusion or stenosis of unspecified cerebral artery: Secondary | ICD-10-CM

## 2014-10-20 LAB — POCT INR: INR: 1.6

## 2014-11-08 ENCOUNTER — Encounter: Payer: Self-pay | Admitting: Adult Health

## 2014-11-08 ENCOUNTER — Ambulatory Visit (INDEPENDENT_AMBULATORY_CARE_PROVIDER_SITE_OTHER): Payer: Self-pay | Admitting: Adult Health

## 2014-11-08 ENCOUNTER — Ambulatory Visit (INDEPENDENT_AMBULATORY_CARE_PROVIDER_SITE_OTHER): Payer: Self-pay | Admitting: *Deleted

## 2014-11-08 VITALS — BP 138/88 | HR 80 | Ht 66.0 in | Wt 176.0 lb

## 2014-11-08 DIAGNOSIS — Z720 Tobacco use: Secondary | ICD-10-CM

## 2014-11-08 DIAGNOSIS — I238 Other current complications following acute myocardial infarction: Secondary | ICD-10-CM

## 2014-11-08 DIAGNOSIS — I6350 Cerebral infarction due to unspecified occlusion or stenosis of unspecified cerebral artery: Secondary | ICD-10-CM

## 2014-11-08 DIAGNOSIS — Z5181 Encounter for therapeutic drug level monitoring: Secondary | ICD-10-CM

## 2014-11-08 DIAGNOSIS — F17200 Nicotine dependence, unspecified, uncomplicated: Secondary | ICD-10-CM

## 2014-11-08 DIAGNOSIS — I10 Essential (primary) hypertension: Secondary | ICD-10-CM

## 2014-11-08 DIAGNOSIS — I2510 Atherosclerotic heart disease of native coronary artery without angina pectoris: Secondary | ICD-10-CM

## 2014-11-08 DIAGNOSIS — Z7901 Long term (current) use of anticoagulants: Secondary | ICD-10-CM

## 2014-11-08 DIAGNOSIS — I639 Cerebral infarction, unspecified: Secondary | ICD-10-CM

## 2014-11-08 LAB — POCT INR: INR: 2.3

## 2014-11-08 NOTE — Assessment & Plan Note (Signed)
I have carefully reviewed. His medications with him, to make sure that he was taking all of them as directed. Turns out that he was only taken the Toprol in the morning instead of twice a day. His blood pressures usually elevated in the morning and nicely experiences most of his headaches. He continues to take the lisinopril 20 mg daily. I will start small by having him take his medication, metoprolol 25 mg twice a day as directed. I have asked him to keep track of his blood pressure and write them down for me. He will come back in one week with his blood pressure recordings and had his blood pressure checked here in the clinic. If necessary, will add a low dose of HCTZ to assist with blood pressure control. If it remains elevated despite taking medications as directed.

## 2014-11-08 NOTE — Patient Instructions (Signed)
Your physician recommends that you schedule a follow-up appointment in: 1 WEEK FOR BLOOD PRESSURE CHECK  1 MONTH WITH KATHRYN LAWRENCE  Your physician has requested that you regularly monitor and record your blood pressure readings at home. Please use the same machine at the same time of day to check your readings and record them to bring to your follow-up visit.  PLEASE TAKE YOUR METOPROLOL 25 MG TWICE DAILY  Thank you for choosing [facility]!!

## 2014-11-08 NOTE — Progress Notes (Signed)
HPI: Steve Vang is a 48 year old patient of Dr. Diona Browner, that we follow for ongoing assessment and management of CAD, history of CABG, hypertension, CVA, on chronic anticoagulation, and ongoing tobacco abuse. The patient was last seen in the office in September 2015. He comes today for assistance with blood pressure control.he states that he went to his mental health relation. This morning and was found to have a blood pressure of 171/104. This was concerning to the staff there and was advised to followup with his cardiologist. He did come to his Coumadin clinic appointment and blood pressure was elevated at 145/100. He was added on to my schedule to discuss this further. He states that he has been having more headaches lately. He unfortunately, continues to smoke. He denies any dizziness, chest pain, or weakness.    Allergies  Allergen Reactions  . Benadryl [Diphenhydramine]     Current Outpatient Prescriptions  Medication Sig Dispense Refill  . aspirin 81 MG tablet Take 81 mg by mouth daily.      . cetirizine (ZYRTEC) 10 MG tablet Take 1 tablet (10 mg total) by mouth daily. 14 tablet 0  . citalopram (CELEXA) 20 MG tablet Take 20 mg by mouth every evening.     Marland Kitchen lisinopril (PRINIVIL,ZESTRIL) 20 MG tablet Take 1 tablet (20 mg total) by mouth daily. 90 tablet 1  . lovastatin (MEVACOR) 20 MG tablet TAKE ONE TABLET BY MOUTH AT BEDTIME 30 tablet 11  . metoprolol tartrate (LOPRESSOR) 25 MG tablet Take 1 tablet (25 mg total) by mouth 2 (two) times daily. 180 tablet 1  . nitroGLYCERIN (NITROSTAT) 0.4 MG SL tablet Place 1 tablet (0.4 mg total) under the tongue every 5 (five) minutes as needed. 25 tablet 3  . permethrin (ELIMITE) 5 % cream Apply to affected area once 60 g 0  . warfarin (COUMADIN) 5 MG tablet TAKE ONE TABLET BY MOUTH ONCE DAILY EXCEPT TAKE 1  &  1/2  TABLETS  BY  MOUTH  ON  MONDAYS,  WEDNESDAYS,  AND  FRIDAYS  OR  AS  DIRECTED 45 tablet 3   No current facility-administered  medications for this visit.    Past Medical History  Diagnosis Date  . CAD (coronary artery disease)     Multivessel, LVEF 45-50%  . Cerebrovascular disease     Stroke 2004  . Hyperlipidemia   . Hypertension   . Left ventricular mural thrombus     Apical  . Depression     Past Surgical History  Procedure Laterality Date  . Coronary artery bypass graft      DOR anterior ventricular restoration surgery 8/06, [facility] [place] to first diagonal , SVG to PLB, SVG to RVE branch of nondominant RCA  . Eye surgery    . Valve replacement      ROS:  Review of systems complete and found to be negative unless listed above   PHYSICAL EXAM BP 138/88 mmHg  Pulse 80  Ht 5\' 6"  (1.676 m)  Wt 176 lb (79.833 kg)  BMI 28.42 kg/m2 General: Well developed, well nourished, in no acute distress Head: Eyes PERRLA, No xanthomas.   Normal cephalic and atramatic  Lungs: Clear bilaterally to auscultation and percussion. Heart: HRRR S1 S2, without MRG.  Pulses are 2+ & equal.            No carotid bruit. No JVD.  No abdominal bruits. No femoral bruits. Abdomen: Bowel sounds are positive, abdomen soft and non-tender without masses or  Hernia's noted. Msk:  Back normal, normal gait. Normal strength and tone for age. Extremities: No clubbing, cyanosis or edema.  DP +1 Neuro: Alert and oriented X 3. Psych:  Good affect, responds appropriately   ASSESSMENT AND PLAN

## 2014-11-08 NOTE — Assessment & Plan Note (Signed)
He unfortunately, continues to smoke one pack a day, which is a significant risk factor for progressive CAD, especially in someone who is artery had bypass. I talked with him about smoking cessation. He does not appear to be ready to stop at this time.

## 2014-11-08 NOTE — Progress Notes (Deleted)
Name: Steve Vang    DOB: Aug 02, 1966  Age: 48 y.o.  MR#: [id number]       PCP:  Raiford Simmonds., PA-C      Insurance: Payor: / No coverage found.  CC:    Chief Complaint  Patient presents with  . Coronary Artery Disease    Hx of CABG  . Hypertension  . Cerebrovascular Accident    On coumadin    VS Filed Vitals:   11/08/14 1447  BP: 138/88  Pulse: 80  Height: 5' 6"  (1.676 m)  Weight: 176 lb (79.833 kg)    Weights Current Weight  11/08/14 176 lb (79.833 kg)  09/28/14 175 lb (79.379 kg)  07/18/14 161 lb (73.029 kg)    Blood Pressure  BP Readings from Last 3 Encounters:  11/08/14 138/88  09/28/14 132/70  07/18/14 128/76     Admit date:  (Not on file) Last encounter with RMR:  10/14/2014   Allergy Benadryl  Current Outpatient Prescriptions  Medication Sig Dispense Refill  . aspirin 81 MG tablet Take 81 mg by mouth daily.      . cetirizine (ZYRTEC) 10 MG tablet Take 1 tablet (10 mg total) by mouth daily. 14 tablet 0  . citalopram (CELEXA) 20 MG tablet Take 20 mg by mouth every evening.     Marland Kitchen lisinopril (PRINIVIL,ZESTRIL) 20 MG tablet Take 1 tablet (20 mg total) by mouth daily. 90 tablet 1  . lovastatin (MEVACOR) 20 MG tablet TAKE ONE TABLET BY MOUTH AT BEDTIME 30 tablet 11  . metoprolol tartrate (LOPRESSOR) 25 MG tablet Take 1 tablet (25 mg total) by mouth 2 (two) times daily. 180 tablet 1  . nitroGLYCERIN (NITROSTAT) 0.4 MG SL tablet Place 1 tablet (0.4 mg total) under the tongue every 5 (five) minutes as needed. 25 tablet 3  . permethrin (ELIMITE) 5 % cream Apply to affected area once 60 g 0  . warfarin (COUMADIN) 5 MG tablet TAKE ONE TABLET BY MOUTH ONCE DAILY EXCEPT TAKE 1  &  1/2  TABLETS  BY  MOUTH  ON  MONDAYS,  WEDNESDAYS,  AND  FRIDAYS  OR  AS  DIRECTED 45 tablet 3   No current facility-administered medications for this visit.    Discontinued Meds:    Medications Discontinued During This Encounter  Medication Reason  . cyclobenzaprine (FLEXERIL) 5 MG  tablet Error  . oxyCODONE-acetaminophen (PERCOCET/ROXICET) 5-325 MG per tablet Error    Patient Active Problem List   Diagnosis Date Noted  . Encounter for therapeutic drug monitoring 03/03/2014  . Encounter for long-term (current) use of anticoagulants 04/16/2011  . GAIT IMBALANCE 02/02/2011  . ABNORMAL CV (STRESS) TEST 10/02/2010  . MURAL THROMBUS, LEFT VENTRICLE 09/08/2010  . OTHER SPEC FORMS CHRONIC ISCHEMIC HEART DISEASE 06/07/2010  . Mixed hyperlipidemia 03/02/2010  . TOBACCO ABUSE 03/02/2010  . ESSENTIAL HYPERTENSION, BENIGN 03/02/2010  . CORONARY ATHEROSCLEROSIS NATIVE CORONARY ARTERY 03/02/2010  . CVA 03/01/2010    LABS    Component Value Date/Time   NA 137 07/07/2013 1504   NA 141 09/04/2010 0434   NA 137 09/03/2010 1752   K 4.7 07/07/2013 1504   K 4.6 09/04/2010 0434   K 4.6 09/03/2010 1752   CL 100 07/07/2013 1504   CL 106 09/04/2010 0434   CL 104 09/03/2010 1752   CO2 29 07/07/2013 1504   CO2 31 09/04/2010 0434   CO2 26 09/03/2010 1752   GLUCOSE 115* 07/07/2013 1504   GLUCOSE 96 09/04/2010 0434   GLUCOSE 92  09/03/2010 1752   BUN 12 07/07/2013 1504   BUN 12 09/04/2010 0434   BUN 12 09/03/2010 1752   CREATININE 1.19 07/07/2013 1504   CREATININE 1.09 09/04/2010 0434   CREATININE 1.10 09/03/2010 1752   CREATININE 1.09 06/21/2010 1835   CALCIUM 10.1 07/07/2013 1504   CALCIUM 9.3 09/04/2010 0434   CALCIUM 9.3 09/03/2010 1752   GFRNONAA >60 09/04/2010 0434   GFRNONAA >60 09/03/2010 1752   GFRNONAA >60 04/27/2010 1113   GFRAA  09/04/2010 0434    >60        The eGFR has been calculated using the MDRD equation. This calculation has not been validated in all clinical situations. eGFR's persistently <60 mL/min signify possible Chronic Kidney Disease.   GFRAA  09/03/2010 1752    >60        The eGFR has been calculated using the MDRD equation. This calculation has not been validated in all clinical situations. eGFR's persistently <60 mL/min  signify possible Chronic Kidney Disease.   GFRAA  04/27/2010 1113    >60        The eGFR has been calculated using the MDRD equation. This calculation has not been validated in all clinical situations. eGFR's persistently <60 mL/min signify possible Chronic Kidney Disease.   CMP     Component Value Date/Time   NA 137 07/07/2013 1504   K 4.7 07/07/2013 1504   CL 100 07/07/2013 1504   CO2 29 07/07/2013 1504   GLUCOSE 115* 07/07/2013 1504   BUN 12 07/07/2013 1504   CREATININE 1.19 07/07/2013 1504   CREATININE 1.09 09/04/2010 0434   CALCIUM 10.1 07/07/2013 1504   PROT 6.9 03/28/2011 0829   ALBUMIN 4.3 03/28/2011 0829   AST 22 03/28/2011 0829   ALT 27 03/28/2011 0829   ALKPHOS 119* 03/28/2011 0829   BILITOT 0.4 03/28/2011 0829   GFRNONAA >60 09/04/2010 0434   GFRAA  09/04/2010 0434    >60        The eGFR has been calculated using the MDRD equation. This calculation has not been validated in all clinical situations. eGFR's persistently <60 mL/min signify possible Chronic Kidney Disease.       Component Value Date/Time   WBC 6.2 09/04/2010 0434   WBC 6.5 09/03/2010 1752   WBC 9.3 04/27/2010 1113   HGB 14.5 09/04/2010 0434   HGB 14.7 09/03/2010 1752   HGB 16.1 04/27/2010 1113   HCT 42.0 09/04/2010 0434   HCT 43.5 09/03/2010 1752   HCT 45.7 04/27/2010 1113   MCV 90.3 09/04/2010 0434   MCV 92.5 09/03/2010 1752   MCV 91.7 04/27/2010 1113    Lipid Panel     Component Value Date/Time   CHOL 156 03/28/2011 0829   TRIG 100 03/28/2011 0829   HDL 40 03/28/2011 0829   CHOLHDL 3.9 03/28/2011 0829   VLDL 20 03/28/2011 0829   LDLCALC 96 03/28/2011 0829    ABG No results found for: PHART, PCO2ART, PO2ART, HCO3, TCO2, ACIDBASEDEF, O2SAT   Lab Results  Component Value Date   TSH 2.934 09/04/2010   BNP (last 3 results) No results for input(s): PROBNP in the last 8760 hours. Cardiac Panel (last 3 results) No results for input(s): CKTOTAL, CKMB, TROPONINI, RELINDX  in the last 72 hours.  Iron/TIBC/Ferritin/ %Sat No results found for: IRON, TIBC, FERRITIN, IRONPCTSAT   EKG Orders placed or performed in visit on 09/28/14  . EKG 12-Lead     Prior Assessment and Plan Problem List as of 11/08/2014  Cardiovascular and Mediastinum   ESSENTIAL HYPERTENSION, BENIGN   Last Assessment & Plan   09/28/2014 Office Visit Written 09/28/2014  2:23 PM by Lendon Colonel, NP    Blood pressure is well-controlled. No changes in his medication regimen at this time.    CORONARY ATHEROSCLEROSIS NATIVE CORONARY ARTERY   Last Assessment & Plan   09/28/2014 Office Visit Written 09/28/2014  2:22 PM by Lendon Colonel, NP    He continues to be asymptomatic, he states he has walked up to 5-10 miles a day with his work, has had no angina symptoms, shortness of breath, or weakness. He is medically compliant. Will check lipids LFTs for evaluation of status. He will continue his current medication regimen without changes. No plans to repeat any testing at this time unless he becomes symptomatic.    OTHER SPEC FORMS CHRONIC ISCHEMIC HEART DISEASE   MURAL THROMBUS, LEFT VENTRICLE   Last Assessment & Plan   06/27/2012 Office Visit Written 06/27/2012 12:39 PM by Lendon Colonel, NP    He remains on coumadin. I will check CBC and continue INR checks.     CVA   Last Assessment & Plan   09/28/2014 Office Visit Edited 09/28/2014  2:24 PM by Lendon Colonel, NP    He said no focal neurological changes. He is taking Coumadin as directed and is being followed in our Coumadin clinic in [place]. I will  check a CBC. He does not have a primary care physician and therefore annual  are being monitored by our practice      Other   Mixed hyperlipidemia   Last Assessment & Plan   09/28/2014 Office Visit Written 09/28/2014  2:25 PM by Lendon Colonel, NP    Fasting lipids and LFTs are being ordered.    TOBACCO ABUSE   Last Assessment & Plan   09/28/2014 Office Visit Written  09/28/2014  2:24 PM by Lendon Colonel, NP    He unfortunately continues to smoke. He has had multiple smoking cessation talks there are this. He does not appear willing her ready to quit at this time    ABNORMAL CV (STRESS) TEST   GAIT IMBALANCE   Encounter for long-term (current) use of anticoagulants   Encounter for therapeutic drug monitoring       Imaging: No results found.

## 2014-11-08 NOTE — Assessment & Plan Note (Signed)
He has no complaints of chest pain or palpitations. His energy level remains the same. I do not have plans to do any cardiac testing unless his blood pressure continues to be difficult to control. May need reevaluation for ischemia, which may have progressed.we will consider having a stress test completed.

## 2014-11-09 ENCOUNTER — Telehealth: Payer: Self-pay | Admitting: *Deleted

## 2014-11-09 MED ORDER — METOPROLOL TARTRATE 25 MG PO TABS: 25.0000 mg | ORAL_TABLET | Freq: Two times a day (BID) | ORAL | Status: DC

## 2014-11-09 NOTE — Telephone Encounter (Signed)
METOPROLOL needs called in to [facility] in [place]

## 2014-11-10 ENCOUNTER — Telehealth: Payer: Self-pay | Admitting: Adult Health

## 2014-11-10 MED ORDER — SPIRONOLACTONE 25 MG PO TABS: 12.5000 mg | ORAL_TABLET | Freq: Every day | ORAL | Status: DC

## 2014-11-10 NOTE — Telephone Encounter (Signed)
180s/100s per pt wife for 2 days. Will forward to Joni Reining, NP

## 2014-11-10 NOTE — Telephone Encounter (Signed)
Patient has been taking Metoprolol twice a day since Monday but BP is still high.  Samara Deist mentioned in note that she may start him on HCTZ if no better.  Please advise. / tgs

## 2014-11-10 NOTE — Telephone Encounter (Signed)
Pt wife aware of spironolactone 12.5 mg daily. Confirmed change of pharmacy to [facility] [place].

## 2014-11-10 NOTE — Telephone Encounter (Signed)
Begin spironolactone 12.5 mg daily. Continue to take BP readings. Avoid caffeine and salt.

## 2014-11-11 ENCOUNTER — Telehealth: Payer: Self-pay | Admitting: *Deleted

## 2014-11-11 DIAGNOSIS — Z5181 Encounter for therapeutic drug level monitoring: Secondary | ICD-10-CM

## 2014-11-11 NOTE — Telephone Encounter (Signed)
Per Joni Reining, NP pt needs BMP next week. Pt wife made aware, mailed lab slip and placed orders. Pt wife understood.

## 2014-11-16 ENCOUNTER — Ambulatory Visit (INDEPENDENT_AMBULATORY_CARE_PROVIDER_SITE_OTHER): Payer: Self-pay

## 2014-11-16 VITALS — BP 128/90 | HR 75

## 2014-11-16 DIAGNOSIS — I10 Essential (primary) hypertension: Secondary | ICD-10-CM

## 2014-11-16 NOTE — Progress Notes (Signed)
BP 128/90 HR 75  Has not misses any doses of medication   Will forward to Ms.Lyman Bishop NP

## 2014-11-16 NOTE — Patient Instructions (Signed)
We will have Joni Reining NP review your BP log. We will call you with any changes in your medication       Thank you for choosing [facility] !

## 2014-11-16 NOTE — Progress Notes (Signed)
Reviewed his BP recordings. He has not had the BMET I asked. I put him on Spironolactone and he will need this lab drawn. BP range is WNL. Avoid salt.

## 2014-11-17 ENCOUNTER — Telehealth: Payer: Self-pay | Admitting: *Deleted

## 2014-11-17 ENCOUNTER — Encounter: Payer: Self-pay | Admitting: Adult Health

## 2014-11-17 NOTE — Telephone Encounter (Signed)
-----   Message from Jodelle Gross, NP sent at 11/16/2014  5:03 PM EST -----   ----- Message -----    From: Nori Riis, RN    Sent: 11/16/2014   4:35 PM      To: Jodelle Gross, NP

## 2014-11-17 NOTE — Telephone Encounter (Signed)
Pt made aware, states he will have labs done later this afternoon

## 2014-11-18 LAB — BASIC METABOLIC PANEL WITH GFR
BUN: 17 mg/dL (ref 6–23)
CALCIUM: 9.1 mg/dL (ref 8.4–10.5)
CHLORIDE: 104 meq/L (ref 96–112)
CO2: 29 mEq/L (ref 19–32)
Creat: 1.21 mg/dL (ref 0.50–1.35)
GFR, EST AFRICAN AMERICAN: 81 mL/min
GFR, Est Non African American: 70 mL/min
GLUCOSE: 85 mg/dL (ref 70–99)
POTASSIUM: 4.9 meq/L (ref 3.5–5.3)
Sodium: 138 mEq/L (ref 135–145)

## 2014-12-03 NOTE — Progress Notes (Signed)
HPI: [name] is a 48 year old patient of Dr. Diona Browner, that we follow for ongoing assessment and management of CAD, history of CABG, hypertension, CVA, chronic anticoagulation. He was last seen in the office on 11/08/2014. His found to be very hypertensive with a blood pressure 171/104. The patient has been having more headaches lately. The patient had not been taking his medications as directed. He was to take Toprol twice a day, has he had been and taking once a day. He was advised to continue his other medications as to record take his blood pressures at home, record them, and bring them back on next followup office visit. If necessary. We were going to add a low dose of HCTZ to assist with blood pressure control. He was also advised on smoking cessation and the risks of ongoing, and recurrent CAD.  He comes today without complaints. In fact, he is feeling much better. No chest pain or headaches. He is taking his medications as directed.   Allergies  Allergen Reactions  . Benadryl [Diphenhydramine]     Current Outpatient Prescriptions  Medication Sig Dispense Refill  . aspirin 81 MG tablet Take 81 mg by mouth daily.      . citalopram (CELEXA) 20 MG tablet Take 20 mg by mouth every evening.     Marland Kitchen lisinopril (PRINIVIL,ZESTRIL) 20 MG tablet Take 1 tablet (20 mg total) by mouth daily. 90 tablet 1  . lovastatin (MEVACOR) 20 MG tablet TAKE ONE TABLET BY MOUTH AT BEDTIME 30 tablet 11  . metoprolol tartrate (LOPRESSOR) 25 MG tablet Take 1 tablet (25 mg total) by mouth 2 (two) times daily. 120 tablet 3  . nitroGLYCERIN (NITROSTAT) 0.4 MG SL tablet Place 1 tablet (0.4 mg total) under the tongue every 5 (five) minutes as needed. 25 tablet 3  . permethrin (ELIMITE) 5 % cream Apply to affected area once 60 g 0  . spironolactone (ALDACTONE) 25 MG tablet Take 0.5 tablets (12.5 mg total) by mouth daily. 90 tablet 3  . warfarin (COUMADIN) 5 MG tablet TAKE ONE TABLET BY MOUTH ONCE DAILY EXCEPT TAKE 1  &   1/2  TABLETS  BY  MOUTH  ON  MONDAYS,  WEDNESDAYS,  AND  FRIDAYS  OR  AS  DIRECTED 45 tablet 3   No current facility-administered medications for this visit.    Past Medical History  Diagnosis Date  . CAD (coronary artery disease)     Multivessel, LVEF 45-50%  . Cerebrovascular disease     Stroke 2004  . Hyperlipidemia   . Hypertension   . Left ventricular mural thrombus     Apical  . Depression     Past Surgical History  Procedure Laterality Date  . Coronary artery bypass graft      DOR anterior ventricular restoration surgery 8/06, [facility] to first diagonal , SVG to PLB, SVG to RVE branch of nondominant RCA  . Eye surgery    . Valve replacement      TDV:[id number] review of systems performed and found to be negative unless outlined above  PHYSICAL EXAM BP 128/80 mmHg  Pulse 70  Ht 5\' 6"  (1.676 m)  Wt 172 lb 9.6 oz (78.291 kg)  BMI 27.87 kg/m2 General: Well developed, well nourished, in no acute distress Head: Eyes PERRLA, No xanthomas.   Normal cephalic and atramatic  Lungs: Clear bilaterally to auscultation and percussion. Heart: HRRR S1 S2, without MRG.  Pulses are 2+ & equal.  No carotid bruit. No JVD.  No abdominal bruits. No femoral bruits. Abdomen: Bowel sounds are positive, abdomen soft and non-tender without masses or                  Hernia's noted. Msk:  Back normal, normal gait. Normal strength and tone for age. Extremities: No clubbing, cyanosis or edema.  DP +1 Neuro: Alert and oriented X 3. Psych:  Good affect, responds appropriately   ASSESSMENT AND PLAN

## 2014-12-06 ENCOUNTER — Encounter: Payer: Self-pay | Admitting: Adult Health

## 2014-12-06 ENCOUNTER — Ambulatory Visit (INDEPENDENT_AMBULATORY_CARE_PROVIDER_SITE_OTHER): Payer: Self-pay | Admitting: Adult Health

## 2014-12-06 ENCOUNTER — Ambulatory Visit (INDEPENDENT_AMBULATORY_CARE_PROVIDER_SITE_OTHER): Payer: Self-pay | Admitting: *Deleted

## 2014-12-06 VITALS — BP 128/80 | HR 70 | Ht 66.0 in | Wt 172.6 lb

## 2014-12-06 DIAGNOSIS — I10 Essential (primary) hypertension: Secondary | ICD-10-CM

## 2014-12-06 DIAGNOSIS — I238 Other current complications following acute myocardial infarction: Secondary | ICD-10-CM

## 2014-12-06 DIAGNOSIS — I639 Cerebral infarction, unspecified: Secondary | ICD-10-CM

## 2014-12-06 DIAGNOSIS — Z7901 Long term (current) use of anticoagulants: Secondary | ICD-10-CM

## 2014-12-06 DIAGNOSIS — Z5181 Encounter for therapeutic drug level monitoring: Secondary | ICD-10-CM

## 2014-12-06 DIAGNOSIS — I6350 Cerebral infarction due to unspecified occlusion or stenosis of unspecified cerebral artery: Secondary | ICD-10-CM

## 2014-12-06 DIAGNOSIS — I2510 Atherosclerotic heart disease of native coronary artery without angina pectoris: Secondary | ICD-10-CM

## 2014-12-06 LAB — POCT INR: INR: 2.4

## 2014-12-06 MED ORDER — LOVASTATIN 20 MG PO TABS: 20.0000 mg | ORAL_TABLET | Freq: Every day | ORAL | Status: DC

## 2014-12-06 MED ORDER — LISINOPRIL 20 MG PO TABS: 20.0000 mg | ORAL_TABLET | Freq: Every day | ORAL | Status: DC

## 2014-12-06 MED ORDER — METOPROLOL TARTRATE 25 MG PO TABS: 25.0000 mg | ORAL_TABLET | Freq: Two times a day (BID) | ORAL | Status: DC

## 2014-12-06 MED ORDER — SPIRONOLACTONE 25 MG PO TABS: 12.5000 mg | ORAL_TABLET | Freq: Every day | ORAL | Status: DC

## 2014-12-06 NOTE — Patient Instructions (Signed)
Your physician wants you to follow-up in: 6 months You will receive a reminder letter in the mail two months in advance. If you don't receive a letter, please call our office to schedule the follow-up appointment.     Your physician recommends that you continue on your current medications as directed. Please refer to the Current Medication list given to you today.      Thank you for choosing [facility] !        

## 2014-12-06 NOTE — Assessment & Plan Note (Signed)
No chest pain, no palpitations. He is doing well. See him in 6 months.

## 2014-12-06 NOTE — Assessment & Plan Note (Signed)
He is doing much better. He will continue current medications. Will see him in 6 months.

## 2014-12-06 NOTE — Progress Notes (Deleted)
Name: Steve Vang    DOB: 07-08-1966  Age: 48 y.o.  MR#: [id number]       PCP:  Raiford Simmonds., PA-C      Insurance: Payor: / No coverage found.  CC:    Chief Complaint  Patient presents with  . Coronary Artery Disease  . Hypertension  . Cerebrovascular Accident    VS Filed Vitals:   12/06/14 1347  BP: 128/80  Pulse: 70  Height: 5' 6"  (1.676 m)  Weight: 172 lb 9.6 oz (78.291 kg)    Weights Current Weight  12/06/14 172 lb 9.6 oz (78.291 kg)  11/08/14 176 lb (79.833 kg)  09/28/14 175 lb (79.379 kg)    Blood Pressure  BP Readings from Last 3 Encounters:  12/06/14 128/80  11/16/14 128/90  11/08/14 138/88     Admit date:  (Not on file) Last encounter with RMR:  11/10/2014   Allergy Benadryl  Current Outpatient Prescriptions  Medication Sig Dispense Refill  . aspirin 81 MG tablet Take 81 mg by mouth daily.      . citalopram (CELEXA) 20 MG tablet Take 20 mg by mouth every evening.     Marland Kitchen lisinopril (PRINIVIL,ZESTRIL) 20 MG tablet Take 1 tablet (20 mg total) by mouth daily. 90 tablet 1  . lovastatin (MEVACOR) 20 MG tablet TAKE ONE TABLET BY MOUTH AT BEDTIME 30 tablet 11  . metoprolol tartrate (LOPRESSOR) 25 MG tablet Take 1 tablet (25 mg total) by mouth 2 (two) times daily. 120 tablet 3  . nitroGLYCERIN (NITROSTAT) 0.4 MG SL tablet Place 1 tablet (0.4 mg total) under the tongue every 5 (five) minutes as needed. 25 tablet 3  . permethrin (ELIMITE) 5 % cream Apply to affected area once 60 g 0  . spironolactone (ALDACTONE) 25 MG tablet Take 0.5 tablets (12.5 mg total) by mouth daily. 90 tablet 3  . warfarin (COUMADIN) 5 MG tablet TAKE ONE TABLET BY MOUTH ONCE DAILY EXCEPT TAKE 1  &  1/2  TABLETS  BY  MOUTH  ON  MONDAYS,  WEDNESDAYS,  AND  FRIDAYS  OR  AS  DIRECTED 45 tablet 3   No current facility-administered medications for this visit.    Discontinued Meds:    Medications Discontinued During This Encounter  Medication Reason  . cetirizine (ZYRTEC) 10 MG tablet Error     Patient Active Problem List   Diagnosis Date Noted  . Encounter for therapeutic drug monitoring 03/03/2014  . Encounter for long-term (current) use of anticoagulants 04/16/2011  . GAIT IMBALANCE 02/02/2011  . ABNORMAL CV (STRESS) TEST 10/02/2010  . MURAL THROMBUS, LEFT VENTRICLE 09/08/2010  . OTHER SPEC FORMS CHRONIC ISCHEMIC HEART DISEASE 06/07/2010  . Mixed hyperlipidemia 03/02/2010  . TOBACCO ABUSE 03/02/2010  . ESSENTIAL HYPERTENSION, BENIGN 03/02/2010  . CORONARY ATHEROSCLEROSIS NATIVE CORONARY ARTERY 03/02/2010  . CVA 03/01/2010    LABS    Component Value Date/Time   NA 138 11/17/2014 1545   NA 137 07/07/2013 1504   NA 141 09/04/2010 0434   K 4.9 11/17/2014 1545   K 4.7 07/07/2013 1504   K 4.6 09/04/2010 0434   CL 104 11/17/2014 1545   CL 100 07/07/2013 1504   CL 106 09/04/2010 0434   CO2 29 11/17/2014 1545   CO2 29 07/07/2013 1504   CO2 31 09/04/2010 0434   GLUCOSE 85 11/17/2014 1545   GLUCOSE 115* 07/07/2013 1504   GLUCOSE 96 09/04/2010 0434   BUN 17 11/17/2014 1545   BUN 12 07/07/2013 1504  BUN 12 09/04/2010 0434   CREATININE 1.21 11/17/2014 1545   CREATININE 1.19 07/07/2013 1504   CREATININE 1.09 09/04/2010 0434   CREATININE 1.10 09/03/2010 1752   CREATININE 1.09 06/21/2010 1835   CALCIUM 9.1 11/17/2014 1545   CALCIUM 10.1 07/07/2013 1504   CALCIUM 9.3 09/04/2010 0434   GFRNONAA 70 11/17/2014 1545   GFRNONAA >60 09/04/2010 0434   GFRNONAA >60 09/03/2010 1752   GFRNONAA >60 04/27/2010 1113   GFRAA 81 11/17/2014 1545   GFRAA  09/04/2010 0434    >60        The eGFR has been calculated using the MDRD equation. This calculation has not been validated in all clinical situations. eGFR's persistently <60 mL/min signify possible Chronic Kidney Disease.   GFRAA  09/03/2010 1752    >60        The eGFR has been calculated using the MDRD equation. This calculation has not been validated in all clinical situations. eGFR's persistently <60 mL/min  signify possible Chronic Kidney Disease.   GFRAA  04/27/2010 1113    >60        The eGFR has been calculated using the MDRD equation. This calculation has not been validated in all clinical situations. eGFR's persistently <60 mL/min signify possible Chronic Kidney Disease.   CMP     Component Value Date/Time   NA 138 11/17/2014 1545   K 4.9 11/17/2014 1545   CL 104 11/17/2014 1545   CO2 29 11/17/2014 1545   GLUCOSE 85 11/17/2014 1545   BUN 17 11/17/2014 1545   CREATININE 1.21 11/17/2014 1545   CREATININE 1.09 09/04/2010 0434   CALCIUM 9.1 11/17/2014 1545   PROT 6.9 03/28/2011 0829   ALBUMIN 4.3 03/28/2011 0829   AST 22 03/28/2011 0829   ALT 27 03/28/2011 0829   ALKPHOS 119* 03/28/2011 0829   BILITOT 0.4 03/28/2011 0829   GFRNONAA 70 11/17/2014 1545   GFRNONAA >60 09/04/2010 0434   GFRAA 81 11/17/2014 1545   GFRAA  09/04/2010 0434    >60        The eGFR has been calculated using the MDRD equation. This calculation has not been validated in all clinical situations. eGFR's persistently <60 mL/min signify possible Chronic Kidney Disease.       Component Value Date/Time   WBC 6.2 09/04/2010 0434   WBC 6.5 09/03/2010 1752   WBC 9.3 04/27/2010 1113   HGB 14.5 09/04/2010 0434   HGB 14.7 09/03/2010 1752   HGB 16.1 04/27/2010 1113   HCT 42.0 09/04/2010 0434   HCT 43.5 09/03/2010 1752   HCT 45.7 04/27/2010 1113   MCV 90.3 09/04/2010 0434   MCV 92.5 09/03/2010 1752   MCV 91.7 04/27/2010 1113    Lipid Panel     Component Value Date/Time   CHOL 156 03/28/2011 0829   TRIG 100 03/28/2011 0829   HDL 40 03/28/2011 0829   CHOLHDL 3.9 03/28/2011 0829   VLDL 20 03/28/2011 0829   LDLCALC 96 03/28/2011 0829    ABG No results found for: PHART, PCO2ART, PO2ART, HCO3, TCO2, ACIDBASEDEF, O2SAT   Lab Results  Component Value Date   TSH 2.934 09/04/2010   BNP (last 3 results) No results for input(s): PROBNP in the last 8760 hours. Cardiac Panel (last 3 results) No  results for input(s): CKTOTAL, CKMB, TROPONINI, RELINDX in the last 72 hours.  Iron/TIBC/Ferritin/ %Sat No results found for: IRON, TIBC, FERRITIN, IRONPCTSAT   EKG Orders placed or performed in visit on 09/28/14  . EKG 12-Lead  Prior Assessment and Plan Problem List as of 12/06/2014      Cardiovascular and Mediastinum   ESSENTIAL HYPERTENSION, BENIGN   Last Assessment & Plan   11/08/2014 Office Visit Written 11/08/2014  3:26 PM by Lendon Colonel, NP    I have carefully reviewed. His medications with him, to make sure that he was taking all of them as directed. Turns out that he was only taken the Toprol in the morning instead of twice a day. His blood pressures usually elevated in the morning and nicely experiences most of his headaches. He continues to take the lisinopril 20 mg daily. I will start small by having him take his medication, metoprolol 25 mg twice a day as directed. I have asked him to keep track of his blood pressure and write them down for me. He will come back in one week with his blood pressure recordings and had his blood pressure checked here in the clinic. If necessary, will add a low dose of HCTZ to assist with blood pressure control. If it remains elevated despite taking medications as directed.    CORONARY ATHEROSCLEROSIS NATIVE CORONARY ARTERY   Last Assessment & Plan   11/08/2014 Office Visit Written 11/08/2014  3:28 PM by Lendon Colonel, NP    He has no complaints of chest pain or palpitations. His energy level remains the same. I do not have plans to do any cardiac testing unless his blood pressure continues to be difficult to control. May need reevaluation for ischemia, which may have progressed.we will consider having a stress test completed.    OTHER SPEC FORMS CHRONIC ISCHEMIC HEART DISEASE   MURAL THROMBUS, LEFT VENTRICLE   Last Assessment & Plan   06/27/2012 Office Visit Written 06/27/2012 12:39 PM by Lendon Colonel, NP    He remains on coumadin.  I will check CBC and continue INR checks.     CVA   Last Assessment & Plan   09/28/2014 Office Visit Edited 09/28/2014  2:24 PM by Lendon Colonel, NP    He said no focal neurological changes. He is taking Coumadin as directed and is being followed in our Coumadin clinic in [place]. I will  check a CBC. He does not have a primary care physician and therefore annual  are being monitored by our practice      Other   Mixed hyperlipidemia   Last Assessment & Plan   09/28/2014 Office Visit Written 09/28/2014  2:25 PM by Lendon Colonel, NP    Fasting lipids and LFTs are being ordered.    TOBACCO ABUSE   Last Assessment & Plan   11/08/2014 Office Visit Written 11/08/2014  3:29 PM by Lendon Colonel, NP    He unfortunately, continues to smoke one pack a day, which is a significant risk factor for progressive CAD, especially in someone who is artery had bypass. I talked with him about smoking cessation. He does not appear to be ready to stop at this time.    ABNORMAL CV (STRESS) TEST   GAIT IMBALANCE   Encounter for long-term (current) use of anticoagulants   Encounter for therapeutic drug monitoring       Imaging: No results found.

## 2014-12-17 ENCOUNTER — Telehealth: Payer: Self-pay | Admitting: *Deleted

## 2014-12-17 NOTE — Telephone Encounter (Signed)
[facility] in [place] called to let us know that they have changed ,manufactures on patient warfarin

## 2014-12-17 NOTE — Telephone Encounter (Signed)
Noted  

## 2015-01-03 ENCOUNTER — Ambulatory Visit (INDEPENDENT_AMBULATORY_CARE_PROVIDER_SITE_OTHER): Payer: Self-pay | Admitting: *Deleted

## 2015-01-03 DIAGNOSIS — I639 Cerebral infarction, unspecified: Secondary | ICD-10-CM

## 2015-01-03 DIAGNOSIS — Z7901 Long term (current) use of anticoagulants: Secondary | ICD-10-CM

## 2015-01-03 DIAGNOSIS — I238 Other current complications following acute myocardial infarction: Secondary | ICD-10-CM

## 2015-01-03 DIAGNOSIS — Z5181 Encounter for therapeutic drug level monitoring: Secondary | ICD-10-CM

## 2015-01-03 DIAGNOSIS — I6350 Cerebral infarction due to unspecified occlusion or stenosis of unspecified cerebral artery: Secondary | ICD-10-CM

## 2015-01-03 LAB — POCT INR: INR: 1.5

## 2015-01-19 ENCOUNTER — Ambulatory Visit (INDEPENDENT_AMBULATORY_CARE_PROVIDER_SITE_OTHER): Payer: Self-pay | Admitting: *Deleted

## 2015-01-19 DIAGNOSIS — I6350 Cerebral infarction due to unspecified occlusion or stenosis of unspecified cerebral artery: Secondary | ICD-10-CM

## 2015-01-19 DIAGNOSIS — I639 Cerebral infarction, unspecified: Secondary | ICD-10-CM

## 2015-01-19 DIAGNOSIS — Z5181 Encounter for therapeutic drug level monitoring: Secondary | ICD-10-CM

## 2015-01-19 DIAGNOSIS — Z7901 Long term (current) use of anticoagulants: Secondary | ICD-10-CM

## 2015-01-19 DIAGNOSIS — I238 Other current complications following acute myocardial infarction: Secondary | ICD-10-CM

## 2015-01-19 LAB — POCT INR: INR: 2.2

## 2015-02-16 ENCOUNTER — Ambulatory Visit (INDEPENDENT_AMBULATORY_CARE_PROVIDER_SITE_OTHER): Payer: Self-pay | Admitting: *Deleted

## 2015-02-16 DIAGNOSIS — Z7901 Long term (current) use of anticoagulants: Secondary | ICD-10-CM

## 2015-02-16 DIAGNOSIS — I6350 Cerebral infarction due to unspecified occlusion or stenosis of unspecified cerebral artery: Secondary | ICD-10-CM

## 2015-02-16 DIAGNOSIS — I639 Cerebral infarction, unspecified: Secondary | ICD-10-CM

## 2015-02-16 DIAGNOSIS — Z5181 Encounter for therapeutic drug level monitoring: Secondary | ICD-10-CM

## 2015-02-16 DIAGNOSIS — I238 Other current complications following acute myocardial infarction: Secondary | ICD-10-CM

## 2015-02-16 LAB — POCT INR: INR: 1.7

## 2015-03-07 ENCOUNTER — Ambulatory Visit (INDEPENDENT_AMBULATORY_CARE_PROVIDER_SITE_OTHER): Payer: Self-pay | Admitting: *Deleted

## 2015-03-07 DIAGNOSIS — I6350 Cerebral infarction due to unspecified occlusion or stenosis of unspecified cerebral artery: Secondary | ICD-10-CM

## 2015-03-07 DIAGNOSIS — Z5181 Encounter for therapeutic drug level monitoring: Secondary | ICD-10-CM

## 2015-03-07 DIAGNOSIS — Z7901 Long term (current) use of anticoagulants: Secondary | ICD-10-CM

## 2015-03-07 DIAGNOSIS — I639 Cerebral infarction, unspecified: Secondary | ICD-10-CM

## 2015-03-07 DIAGNOSIS — I238 Other current complications following acute myocardial infarction: Secondary | ICD-10-CM

## 2015-03-07 LAB — POCT INR: INR: 1.8

## 2015-03-09 ENCOUNTER — Telehealth: Payer: Self-pay | Admitting: Adult Health

## 2015-03-09 NOTE — Telephone Encounter (Signed)
Received fax refill request  Rx # [id number] Medication:  Warfarin 5 mg tab Qty 45 Sig:  Take one tablet by mouth once daily except take one and one half tab by mouth on Mondays, Wednesdays, and Fridays or as directed Physician:  Lyman Bishop

## 2015-03-10 MED ORDER — WARFARIN SODIUM 5 MG PO TABS: ORAL_TABLET | ORAL | Status: DC

## 2015-03-14 ENCOUNTER — Telehealth: Payer: Self-pay | Admitting: *Deleted

## 2015-03-14 NOTE — Telephone Encounter (Signed)
Rx called to [facility]

## 2015-03-14 NOTE — Telephone Encounter (Signed)
Warfarin was sent to [facility] in [place] on 03/10/15.  They are stating that they do not have it.  Can you please call them for refill due to patient being completely out. / tg

## 2015-03-28 ENCOUNTER — Ambulatory Visit (INDEPENDENT_AMBULATORY_CARE_PROVIDER_SITE_OTHER): Payer: Self-pay | Admitting: *Deleted

## 2015-03-28 DIAGNOSIS — I6350 Cerebral infarction due to unspecified occlusion or stenosis of unspecified cerebral artery: Secondary | ICD-10-CM

## 2015-03-28 DIAGNOSIS — I238 Other current complications following acute myocardial infarction: Secondary | ICD-10-CM

## 2015-03-28 DIAGNOSIS — Z7901 Long term (current) use of anticoagulants: Secondary | ICD-10-CM

## 2015-03-28 DIAGNOSIS — Z5181 Encounter for therapeutic drug level monitoring: Secondary | ICD-10-CM

## 2015-03-28 DIAGNOSIS — I639 Cerebral infarction, unspecified: Secondary | ICD-10-CM

## 2015-03-28 LAB — POCT INR: INR: 2.8

## 2015-04-25 ENCOUNTER — Ambulatory Visit (INDEPENDENT_AMBULATORY_CARE_PROVIDER_SITE_OTHER): Payer: Self-pay | Admitting: *Deleted

## 2015-04-25 DIAGNOSIS — I6350 Cerebral infarction due to unspecified occlusion or stenosis of unspecified cerebral artery: Secondary | ICD-10-CM

## 2015-04-25 DIAGNOSIS — I639 Cerebral infarction, unspecified: Secondary | ICD-10-CM

## 2015-04-25 DIAGNOSIS — Z7901 Long term (current) use of anticoagulants: Secondary | ICD-10-CM

## 2015-04-25 DIAGNOSIS — Z5181 Encounter for therapeutic drug level monitoring: Secondary | ICD-10-CM

## 2015-04-25 DIAGNOSIS — I238 Other current complications following acute myocardial infarction: Secondary | ICD-10-CM

## 2015-04-25 LAB — POCT INR: INR: 1.4

## 2015-05-09 ENCOUNTER — Ambulatory Visit (INDEPENDENT_AMBULATORY_CARE_PROVIDER_SITE_OTHER): Payer: Self-pay | Admitting: *Deleted

## 2015-05-09 DIAGNOSIS — I238 Other current complications following acute myocardial infarction: Secondary | ICD-10-CM

## 2015-05-09 DIAGNOSIS — I639 Cerebral infarction, unspecified: Secondary | ICD-10-CM

## 2015-05-09 DIAGNOSIS — Z7901 Long term (current) use of anticoagulants: Secondary | ICD-10-CM

## 2015-05-09 DIAGNOSIS — I6350 Cerebral infarction due to unspecified occlusion or stenosis of unspecified cerebral artery: Secondary | ICD-10-CM

## 2015-05-09 DIAGNOSIS — Z5181 Encounter for therapeutic drug level monitoring: Secondary | ICD-10-CM

## 2015-05-09 LAB — POCT INR: INR: 1.1

## 2015-05-18 ENCOUNTER — Ambulatory Visit (INDEPENDENT_AMBULATORY_CARE_PROVIDER_SITE_OTHER): Payer: Self-pay | Admitting: *Deleted

## 2015-05-18 DIAGNOSIS — I639 Cerebral infarction, unspecified: Secondary | ICD-10-CM

## 2015-05-18 DIAGNOSIS — Z7901 Long term (current) use of anticoagulants: Secondary | ICD-10-CM

## 2015-05-18 DIAGNOSIS — Z5181 Encounter for therapeutic drug level monitoring: Secondary | ICD-10-CM

## 2015-05-18 DIAGNOSIS — I6350 Cerebral infarction due to unspecified occlusion or stenosis of unspecified cerebral artery: Secondary | ICD-10-CM

## 2015-05-18 DIAGNOSIS — I238 Other current complications following acute myocardial infarction: Secondary | ICD-10-CM

## 2015-05-18 LAB — POCT INR: INR: 2.3

## 2015-06-15 ENCOUNTER — Ambulatory Visit (INDEPENDENT_AMBULATORY_CARE_PROVIDER_SITE_OTHER): Payer: Self-pay | Admitting: *Deleted

## 2015-06-15 DIAGNOSIS — I639 Cerebral infarction, unspecified: Secondary | ICD-10-CM

## 2015-06-15 DIAGNOSIS — Z5181 Encounter for therapeutic drug level monitoring: Secondary | ICD-10-CM

## 2015-06-15 DIAGNOSIS — I6350 Cerebral infarction due to unspecified occlusion or stenosis of unspecified cerebral artery: Secondary | ICD-10-CM

## 2015-06-15 DIAGNOSIS — Z7901 Long term (current) use of anticoagulants: Secondary | ICD-10-CM

## 2015-06-15 DIAGNOSIS — I238 Other current complications following acute myocardial infarction: Secondary | ICD-10-CM

## 2015-06-15 LAB — POCT INR: INR: 1.6

## 2015-06-24 ENCOUNTER — Encounter: Payer: Self-pay | Admitting: Adult Health

## 2015-06-24 ENCOUNTER — Ambulatory Visit (INDEPENDENT_AMBULATORY_CARE_PROVIDER_SITE_OTHER): Payer: Self-pay | Admitting: Adult Health

## 2015-06-24 VITALS — BP 112/68 | HR 67 | Ht 67.5 in | Wt 174.0 lb

## 2015-06-24 DIAGNOSIS — Z720 Tobacco use: Secondary | ICD-10-CM

## 2015-06-24 DIAGNOSIS — I2510 Atherosclerotic heart disease of native coronary artery without angina pectoris: Secondary | ICD-10-CM

## 2015-06-24 NOTE — Progress Notes (Deleted)
Name: Steve Vang    DOB: 1966-03-21  Age: 49 y.o.  MR#: [id number]       PCP:  Raiford Simmonds., PA-C      Insurance: Payor: / No coverage found.  CC:    Chief Complaint  Patient presents with  . Coronary Artery Disease    VS Filed Vitals:   06/24/15 1340  BP: 112/68  Pulse: 67  Height: 5' 7.5" (1.715 m)  Weight: 174 lb (78.926 kg)  SpO2: 94%    Weights Current Weight  06/24/15 174 lb (78.926 kg)  12/06/14 172 lb 9.6 oz (78.291 kg)  11/08/14 176 lb (79.833 kg)    Blood Pressure  BP Readings from Last 3 Encounters:  06/24/15 112/68  12/06/14 128/80  11/16/14 128/90     Admit date:  (Not on file) Last encounter with RMR:  03/09/2015   Allergy Benadryl  Current Outpatient Prescriptions  Medication Sig Dispense Refill  . aspirin 81 MG tablet Take 81 mg by mouth daily.      . citalopram (CELEXA) 20 MG tablet Take 20 mg by mouth every evening.     Marland Kitchen lisinopril (PRINIVIL,ZESTRIL) 20 MG tablet Take 1 tablet (20 mg total) by mouth daily. 90 tablet 1  . lovastatin (MEVACOR) 20 MG tablet Take 1 tablet (20 mg total) by mouth at bedtime. 30 tablet 11  . metoprolol tartrate (LOPRESSOR) 25 MG tablet Take 1 tablet (25 mg total) by mouth 2 (two) times daily. 180 tablet 3  . nitroGLYCERIN (NITROSTAT) 0.4 MG SL tablet Place 1 tablet (0.4 mg total) under the tongue every 5 (five) minutes as needed. 25 tablet 3  . permethrin (ELIMITE) 5 % cream Apply to affected area once 60 g 0  . spironolactone (ALDACTONE) 25 MG tablet Take 0.5 tablets (12.5 mg total) by mouth daily. 45 tablet 3  . warfarin (COUMADIN) 5 MG tablet Take 1 1/2 tablets daily except 1 tablet on Sundays and Thursdays 45 tablet 3   No current facility-administered medications for this visit.    Discontinued Meds:   There are no discontinued medications.  Patient Active Problem List   Diagnosis Date Noted  . Encounter for therapeutic drug monitoring 03/03/2014  . Long term (current) use of anticoagulants 04/16/2011  .  GAIT IMBALANCE 02/02/2011  . ABNORMAL CV (STRESS) TEST 10/02/2010  . MURAL THROMBUS, LEFT VENTRICLE 09/08/2010  . OTHER SPEC FORMS CHRONIC ISCHEMIC HEART DISEASE 06/07/2010  . Mixed hyperlipidemia 03/02/2010  . TOBACCO ABUSE 03/02/2010  . ESSENTIAL HYPERTENSION, BENIGN 03/02/2010  . CORONARY ATHEROSCLEROSIS NATIVE CORONARY ARTERY 03/02/2010  . CVA 03/01/2010    LABS    Component Value Date/Time   NA 138 11/17/2014 1545   NA 137 07/07/2013 1504   NA 141 09/04/2010 0434   K 4.9 11/17/2014 1545   K 4.7 07/07/2013 1504   K 4.6 09/04/2010 0434   CL 104 11/17/2014 1545   CL 100 07/07/2013 1504   CL 106 09/04/2010 0434   CO2 29 11/17/2014 1545   CO2 29 07/07/2013 1504   CO2 31 09/04/2010 0434   GLUCOSE 85 11/17/2014 1545   GLUCOSE 115* 07/07/2013 1504   GLUCOSE 96 09/04/2010 0434   BUN 17 11/17/2014 1545   BUN 12 07/07/2013 1504   BUN 12 09/04/2010 0434   CREATININE 1.21 11/17/2014 1545   CREATININE 1.19 07/07/2013 1504   CREATININE 1.09 09/04/2010 0434   CREATININE 1.10 09/03/2010 1752   CREATININE 1.09 06/21/2010 1835   CALCIUM 9.1 11/17/2014 1545  CALCIUM 10.1 07/07/2013 1504   CALCIUM 9.3 09/04/2010 0434   GFRNONAA 70 11/17/2014 1545   GFRNONAA >60 09/04/2010 0434   GFRNONAA >60 09/03/2010 1752   GFRNONAA >60 04/27/2010 1113   GFRAA 81 11/17/2014 1545   GFRAA  09/04/2010 0434    >60        The eGFR has been calculated using the MDRD equation. This calculation has not been validated in all clinical situations. eGFR's persistently <60 mL/min signify possible Chronic Kidney Disease.   GFRAA  09/03/2010 1752    >60        The eGFR has been calculated using the MDRD equation. This calculation has not been validated in all clinical situations. eGFR's persistently <60 mL/min signify possible Chronic Kidney Disease.   GFRAA  04/27/2010 1113    >60        The eGFR has been calculated using the MDRD equation. This calculation has not been validated in all  clinical situations. eGFR's persistently <60 mL/min signify possible Chronic Kidney Disease.   CMP     Component Value Date/Time   NA 138 11/17/2014 1545   K 4.9 11/17/2014 1545   CL 104 11/17/2014 1545   CO2 29 11/17/2014 1545   GLUCOSE 85 11/17/2014 1545   BUN 17 11/17/2014 1545   CREATININE 1.21 11/17/2014 1545   CREATININE 1.09 09/04/2010 0434   CALCIUM 9.1 11/17/2014 1545   PROT 6.9 03/28/2011 0829   ALBUMIN 4.3 03/28/2011 0829   AST 22 03/28/2011 0829   ALT 27 03/28/2011 0829   ALKPHOS 119* 03/28/2011 0829   BILITOT 0.4 03/28/2011 0829   GFRNONAA 70 11/17/2014 1545   GFRNONAA >60 09/04/2010 0434   GFRAA 81 11/17/2014 1545   GFRAA  09/04/2010 0434    >60        The eGFR has been calculated using the MDRD equation. This calculation has not been validated in all clinical situations. eGFR's persistently <60 mL/min signify possible Chronic Kidney Disease.       Component Value Date/Time   WBC 6.2 09/04/2010 0434   WBC 6.5 09/03/2010 1752   WBC 9.3 04/27/2010 1113   HGB 14.5 09/04/2010 0434   HGB 14.7 09/03/2010 1752   HGB 16.1 04/27/2010 1113   HCT 42.0 09/04/2010 0434   HCT 43.5 09/03/2010 1752   HCT 45.7 04/27/2010 1113   MCV 90.3 09/04/2010 0434   MCV 92.5 09/03/2010 1752   MCV 91.7 04/27/2010 1113    Lipid Panel     Component Value Date/Time   CHOL 156 03/28/2011 0829   TRIG 100 03/28/2011 0829   HDL 40 03/28/2011 0829   CHOLHDL 3.9 03/28/2011 0829   VLDL 20 03/28/2011 0829   LDLCALC 96 03/28/2011 0829    ABG No results found for: PHART, PCO2ART, PO2ART, HCO3, TCO2, ACIDBASEDEF, O2SAT   Lab Results  Component Value Date   TSH 2.934 09/04/2010   BNP (last 3 results) No results for input(s): BNP in the last 8760 hours.  ProBNP (last 3 results) No results for input(s): PROBNP in the last 8760 hours.  Cardiac Panel (last 3 results) No results for input(s): CKTOTAL, CKMB, TROPONINI, RELINDX in the last 72 hours.  Iron/TIBC/Ferritin/  %Sat No results found for: IRON, TIBC, FERRITIN, IRONPCTSAT   EKG Orders placed or performed in visit on 09/28/14  . EKG 12-Lead     Prior Assessment and Plan Problem List as of 06/24/2015    Mixed hyperlipidemia   Last Assessment & Plan 09/28/2014 Office Visit  Written 09/28/2014  2:25 PM by Lendon Colonel, NP    Fasting lipids and LFTs are being ordered.      TOBACCO ABUSE   Last Assessment & Plan 11/08/2014 Office Visit Written 11/08/2014  3:29 PM by Lendon Colonel, NP    He unfortunately, continues to smoke one pack a day, which is a significant risk factor for progressive CAD, especially in someone who is artery had bypass. I talked with him about smoking cessation. He does not appear to be ready to stop at this time.      ESSENTIAL HYPERTENSION, BENIGN   Last Assessment & Plan 12/06/2014 Office Visit Written 12/06/2014  2:01 PM by Lendon Colonel, NP    He is doing much better. He will continue current medications. Will see him in 6 months.       CORONARY ATHEROSCLEROSIS NATIVE CORONARY ARTERY   Last Assessment & Plan 12/06/2014 Office Visit Written 12/06/2014  2:02 PM by Lendon Colonel, NP    No chest pain, no palpitations. He is doing well. See him in 6 months.       OTHER SPEC FORMS CHRONIC ISCHEMIC HEART DISEASE   MURAL THROMBUS, LEFT VENTRICLE   Last Assessment & Plan 06/27/2012 Office Visit Written 06/27/2012 12:39 PM by Lendon Colonel, NP    He remains on coumadin. I will check CBC and continue INR checks.       CVA   Last Assessment & Plan 09/28/2014 Office Visit Edited 09/28/2014  2:24 PM by Lendon Colonel, NP    He said no focal neurological changes. He is taking Coumadin as directed and is being followed in our Coumadin clinic in [place]. I will  check a CBC. He does not have a primary care physician and therefore annual  are being monitored by our practice      ABNORMAL CV (STRESS) TEST   GAIT IMBALANCE   Long term (current) use of  anticoagulants   Encounter for therapeutic drug monitoring       Imaging: No results found.

## 2015-06-24 NOTE — Progress Notes (Signed)
Cardiology Office Note   Date:  06/24/2015   ID:  Steve Vang, DOB [birth year], MRN [id number]  PCP:  [name]., PA-C  Cardiologist:  [place]/ Joni Reining, NP   Chief Complaint  Patient presents with  . Coronary Artery Disease      History of Present Illness: Steve Vang is a 49 y.o. male who presents for and management of CAD, with history of CABG, hypertension, CVA, and chronic anticoagulation.  He was last in the office in December of 2015.  The patient HCTZ added to his medication regimen.  He was advised on smoking cessation.  He comes today without complaints. He continues to smoke. Is medically complaint. No chest pain or DOE. He remains active.    Past Medical History  Diagnosis Date  . CAD (coronary artery disease)     Multivessel, LVEF 45-50%  . Cerebrovascular disease     Stroke 2004  . Hyperlipidemia   . Hypertension   . Left ventricular mural thrombus     Apical  . Depression     Past Surgical History  Procedure Laterality Date  . Coronary artery bypass graft      DOR anterior ventricular restoration surgery 8/06, [facility] [place] to first diagonal , SVG to PLB, SVG to RVE branch of nondominant RCA  . Eye surgery    . Valve replacement       Current Outpatient Prescriptions  Medication Sig Dispense Refill  . aspirin 81 MG tablet Take 81 mg by mouth daily.      . citalopram (CELEXA) 20 MG tablet Take 20 mg by mouth every evening.     Marland Kitchen lisinopril (PRINIVIL,ZESTRIL) 20 MG tablet Take 1 tablet (20 mg total) by mouth daily. 90 tablet 1  . lovastatin (MEVACOR) 20 MG tablet Take 1 tablet (20 mg total) by mouth at bedtime. 30 tablet 11  . metoprolol tartrate (LOPRESSOR) 25 MG tablet Take 1 tablet (25 mg total) by mouth 2 (two) times daily. 180 tablet 3  . nitroGLYCERIN (NITROSTAT) 0.4 MG SL tablet Place 1 tablet (0.4 mg total) under the tongue every 5 (five) minutes as needed. 25 tablet 3  . permethrin (ELIMITE) 5 % cream Apply to affected  area once 60 g 0  . spironolactone (ALDACTONE) 25 MG tablet Take 0.5 tablets (12.5 mg total) by mouth daily. 45 tablet 3  . warfarin (COUMADIN) 5 MG tablet Take 1 1/2 tablets daily except 1 tablet on Sundays and Thursdays 45 tablet 3   No current facility-administered medications for this visit.    Allergies:   Benadryl    Social History:  The patient  reports that he has been smoking Cigarettes.  He has been smoking about 1.50 packs per day. He has never used smokeless tobacco. He reports that he drinks about 1.2 oz of alcohol per week. He reports that he does not use illicit drugs.   Family History:  The patient's family history includes Diabetes in his mother; Hypertension in his mother.    ROS: .   All other systems are reviewed and negative.Unless otherwise mentioned in  H&P above.   PHYSICAL EXAM: VS:  There were no vitals taken for this visit. , BMI There is no weight on file to calculate BMI. GEN: Well nourished, well developed, in no acute distress HEENT: normal Neck: no JVD, carotid bruits, or masses Cardiac: RRR; no murmurs, rubs, or gallops,no edema  Respiratory:  Clear to auscultation bilaterally, normal work of breathing.  GI: soft,  nontender, nondistended, + BS MS: no deformity or atrophy Skin: warm and dry, no rash Neuro:  Strength and sensation are intact Psych: euthymic mood, full affect  Recent Labs: 11/17/2014: BUN 17; Creat 1.21; Potassium 4.9; Sodium 138    Lipid Panel    Component Value Date/Time   CHOL 156 03/28/2011 0829   TRIG 100 03/28/2011 0829   HDL 40 03/28/2011 0829   CHOLHDL 3.9 03/28/2011 0829   VLDL 20 03/28/2011 0829   LDLCALC 96 03/28/2011 0829      Wt Readings from Last 3 Encounters:  12/06/14 172 lb 9.6 oz (78.291 kg)  11/08/14 176 lb (79.833 kg)  09/28/14 175 lb (79.379 kg)      Other studies Reviewed: Additional studies/ records that were reviewed today include: None Review of the above records demonstrates:     ASSESSMENT AND PLAN:  1. CAD:  No complaints of chest pain. He is complaint with meds. I have asked him to stay hydrated as long as he is on HCTZ and working in the heat. He verbalized understanding.   2. Tobacco Abuse: He is again counseled on cessation.   Current medicines are reviewed at length with the patient today.    Labs/ tests ordered today include:none No orders of the defined types were placed in this encounter.     Disposition:   FU with 1 year.  Signed, Joni Reining, NP  06/24/2015 7:32 AM    [facility] [address], [place], [place] [address] Phone: [phone]; Fax: [phone]

## 2015-06-24 NOTE — Patient Instructions (Signed)
Your physician wants you to follow-up in: 6 months with Joni Reining NP You will receive a reminder letter in the mail two months in advance. If you don't receive a letter, please call our office to schedule the follow-up appointment.    Your physician recommends that you continue on your current medications as directed. Please refer to the Current Medication list given to you today.     Thank you for choosing [facility] !

## 2015-07-11 ENCOUNTER — Ambulatory Visit (INDEPENDENT_AMBULATORY_CARE_PROVIDER_SITE_OTHER): Payer: Self-pay | Admitting: *Deleted

## 2015-07-11 DIAGNOSIS — Z5181 Encounter for therapeutic drug level monitoring: Secondary | ICD-10-CM

## 2015-07-11 DIAGNOSIS — I238 Other current complications following acute myocardial infarction: Secondary | ICD-10-CM

## 2015-07-11 DIAGNOSIS — Z7901 Long term (current) use of anticoagulants: Secondary | ICD-10-CM

## 2015-07-11 DIAGNOSIS — I639 Cerebral infarction, unspecified: Secondary | ICD-10-CM

## 2015-07-11 DIAGNOSIS — I6350 Cerebral infarction due to unspecified occlusion or stenosis of unspecified cerebral artery: Secondary | ICD-10-CM

## 2015-07-11 LAB — POCT INR: INR: 2

## 2015-08-08 ENCOUNTER — Ambulatory Visit (INDEPENDENT_AMBULATORY_CARE_PROVIDER_SITE_OTHER): Payer: Self-pay | Admitting: *Deleted

## 2015-08-08 DIAGNOSIS — Z7901 Long term (current) use of anticoagulants: Secondary | ICD-10-CM

## 2015-08-08 DIAGNOSIS — I6350 Cerebral infarction due to unspecified occlusion or stenosis of unspecified cerebral artery: Secondary | ICD-10-CM

## 2015-08-08 DIAGNOSIS — I238 Other current complications following acute myocardial infarction: Secondary | ICD-10-CM

## 2015-08-08 DIAGNOSIS — Z5181 Encounter for therapeutic drug level monitoring: Secondary | ICD-10-CM

## 2015-08-08 DIAGNOSIS — I639 Cerebral infarction, unspecified: Secondary | ICD-10-CM

## 2015-08-08 LAB — POCT INR: INR: 2.6

## 2015-08-26 ENCOUNTER — Other Ambulatory Visit: Payer: Self-pay | Admitting: Adult Health

## 2015-09-09 ENCOUNTER — Ambulatory Visit (INDEPENDENT_AMBULATORY_CARE_PROVIDER_SITE_OTHER): Payer: Self-pay | Admitting: *Deleted

## 2015-09-09 DIAGNOSIS — I6350 Cerebral infarction due to unspecified occlusion or stenosis of unspecified cerebral artery: Secondary | ICD-10-CM

## 2015-09-09 DIAGNOSIS — Z5181 Encounter for therapeutic drug level monitoring: Secondary | ICD-10-CM

## 2015-09-09 DIAGNOSIS — I238 Other current complications following acute myocardial infarction: Secondary | ICD-10-CM

## 2015-09-09 DIAGNOSIS — I639 Cerebral infarction, unspecified: Secondary | ICD-10-CM

## 2015-09-09 DIAGNOSIS — Z7901 Long term (current) use of anticoagulants: Secondary | ICD-10-CM

## 2015-09-09 LAB — POCT INR: INR: 2.5

## 2015-09-28 ENCOUNTER — Telehealth: Payer: Self-pay | Admitting: *Deleted

## 2015-09-28 MED ORDER — WARFARIN SODIUM 5 MG PO TABS: ORAL_TABLET | ORAL | Status: DC

## 2015-09-28 NOTE — Telephone Encounter (Signed)
Needs refill on Warfarin sent to [facility] / tg  °

## 2015-09-29 ENCOUNTER — Encounter (HOSPITAL_COMMUNITY): Payer: Self-pay

## 2015-09-29 ENCOUNTER — Emergency Department (HOSPITAL_COMMUNITY)
Admission: EM | Admit: 2015-09-29 | Discharge: 2015-09-29 | Disposition: A | Discharge status: Home / Self Care | Payer: Medicaid Other | Attending: Emergency Medicine | Admitting: Emergency Medicine

## 2015-09-29 ENCOUNTER — Emergency Department (HOSPITAL_COMMUNITY): Payer: Medicaid Other

## 2015-09-29 DIAGNOSIS — Z7901 Long term (current) use of anticoagulants: Secondary | ICD-10-CM | POA: Insufficient documentation

## 2015-09-29 DIAGNOSIS — I2510 Atherosclerotic heart disease of native coronary artery without angina pectoris: Secondary | ICD-10-CM | POA: Diagnosis not present

## 2015-09-29 DIAGNOSIS — Z8673 Personal history of transient ischemic attack (TIA), and cerebral infarction without residual deficits: Secondary | ICD-10-CM | POA: Insufficient documentation

## 2015-09-29 DIAGNOSIS — F329 Major depressive disorder, single episode, unspecified: Secondary | ICD-10-CM | POA: Insufficient documentation

## 2015-09-29 DIAGNOSIS — Y92007 Garden or yard of unspecified non-institutional (private) residence as the place of occurrence of the external cause: Secondary | ICD-10-CM | POA: Diagnosis not present

## 2015-09-29 DIAGNOSIS — I10 Essential (primary) hypertension: Secondary | ICD-10-CM | POA: Diagnosis not present

## 2015-09-29 DIAGNOSIS — Z7982 Long term (current) use of aspirin: Secondary | ICD-10-CM | POA: Diagnosis not present

## 2015-09-29 DIAGNOSIS — Z79899 Other long term (current) drug therapy: Secondary | ICD-10-CM | POA: Diagnosis not present

## 2015-09-29 DIAGNOSIS — Y9389 Activity, other specified: Secondary | ICD-10-CM | POA: Diagnosis not present

## 2015-09-29 DIAGNOSIS — Z720 Tobacco use: Secondary | ICD-10-CM | POA: Insufficient documentation

## 2015-09-29 DIAGNOSIS — Y990 Civilian activity done for income or pay: Secondary | ICD-10-CM | POA: Diagnosis not present

## 2015-09-29 DIAGNOSIS — S73101A Unspecified sprain of right hip, initial encounter: Secondary | ICD-10-CM | POA: Diagnosis not present

## 2015-09-29 DIAGNOSIS — X58XXXA Exposure to other specified factors, initial encounter: Secondary | ICD-10-CM | POA: Diagnosis not present

## 2015-09-29 DIAGNOSIS — S79911A Unspecified injury of right hip, initial encounter: Secondary | ICD-10-CM | POA: Diagnosis present

## 2015-09-29 LAB — URINE MICROSCOPIC-ADD ON

## 2015-09-29 LAB — URINALYSIS, ROUTINE W REFLEX MICROSCOPIC
BILIRUBIN URINE: NEGATIVE
GLUCOSE, UA: NEGATIVE mg/dL
Ketones, ur: NEGATIVE mg/dL
Leukocytes, UA: NEGATIVE
Nitrite: NEGATIVE
Protein, ur: NEGATIVE mg/dL
Specific Gravity, Urine: <1.005 — ABNORMAL LOW (ref 1.005–1.030)
UROBILINOGEN UA: 0.2 mg/dL (ref 0.0–1.0)
pH: 5.5 (ref 5.0–8.0)

## 2015-09-29 MED ORDER — CYCLOBENZAPRINE HCL 10 MG PO TABS: 10.0000 mg | ORAL_TABLET | Freq: Three times a day (TID) | ORAL | Status: DC | PRN

## 2015-09-29 MED ORDER — OXYCODONE-ACETAMINOPHEN 5-325 MG PO TABS
1.0000 | ORAL_TABLET | Freq: Once | ORAL | Status: AC
  Administered 2015-09-29: 1 via ORAL
  Filled 2015-09-29: qty 1

## 2015-09-29 MED ORDER — KETOROLAC TROMETHAMINE 60 MG/2ML IM SOLN
60.0000 mg | Freq: Once | INTRAMUSCULAR | Status: AC
  Administered 2015-09-29: 60 mg via INTRAMUSCULAR
  Filled 2015-09-29: qty 2

## 2015-09-29 MED ORDER — OXYCODONE-ACETAMINOPHEN 5-325 MG PO TABS: 1.0000 | ORAL_TABLET | ORAL | Status: DC | PRN

## 2015-09-29 NOTE — ED Provider Notes (Signed)
CSN: [id number]     Arrival date & time 09/29/15  1731 History   First MD Initiated Contact with Patient 09/29/15 1756     Chief Complaint  Patient presents with  . Hip Pain     (Consider location/radiation/quality/duration/timing/severity/associated sxs/prior Treatment) HPI   Steve Vang is a 49 y.o. male who presents to the Emergency Department complaining of right hip pain for two weeks.  He states that he was working in the yard, and turned to put something down and felt a "pop" and had a sharp pain to the anterior right hip.  He describes having a continuous pain to the hip that is worse with sitting and bending.  Improves while standing.  He has tried OTC analgesics with minimal relief.  He denies swelling, excessive warmth, redness, fever, numbness or weakness of the leg, scrotal or testicle pain or swelling, difficulty urinating and back pain.   Past Medical History  Diagnosis Date  . CAD (coronary artery disease)     Multivessel, LVEF 45-50%  . Cerebrovascular disease     Stroke 2004  . Hyperlipidemia   . Hypertension   . Left ventricular mural thrombus     Apical  . Depression    Past Surgical History  Procedure Laterality Date  . Coronary artery bypass graft      DOR anterior ventricular restoration surgery 8/06, [facility] [place] to first diagonal , SVG to PLB, SVG to RVE branch of nondominant RCA  . Eye surgery    . Valve replacement     Family History  Problem Relation Age of Onset  . Hypertension Mother   . Diabetes Mother    Social History  Substance Use Topics  . Smoking status: Current Every Day Smoker -- 1.50 packs/day    Types: Cigarettes  . Smokeless tobacco: Never Used  . Alcohol Use: 1.2 oz/week    2 Cans of beer per week     Comment: 1-2 beers a day     Review of Systems  Constitutional: Negative for fever and chills.  Gastrointestinal: Negative for nausea, vomiting and abdominal pain.  Genitourinary: Negative for dysuria, penile  swelling, scrotal swelling, difficulty urinating, penile pain and testicular pain.  Musculoskeletal: Positive for arthralgias (right hip pain). Negative for joint swelling.  Skin: Negative for color change and wound.  Neurological: Negative for weakness and numbness.  All other systems reviewed and are negative.     Allergies  Benadryl  Home Medications   Prior to Admission medications   Medication Sig Start Date End Date Taking? Authorizing Provider  aspirin EC 81 MG tablet Take 81 mg by mouth daily.   Yes Historical Provider, MD  citalopram (CELEXA) 20 MG tablet Take 20 mg by mouth every evening.    Yes Historical Provider, MD  lisinopril (PRINIVIL,ZESTRIL) 20 MG tablet TAKE ONE TABLET BY MOUTH ONCE DAILY 08/29/15  Yes Jodelle Gross, NP  lovastatin (MEVACOR) 20 MG tablet Take 1 tablet (20 mg total) by mouth at bedtime. 12/06/14  Yes Jodelle Gross, NP  metoprolol tartrate (LOPRESSOR) 25 MG tablet Take 1 tablet (25 mg total) by mouth 2 (two) times daily. 12/06/14  Yes Jodelle Gross, NP  spironolactone (ALDACTONE) 25 MG tablet Take 0.5 tablets (12.5 mg total) by mouth daily. 12/06/14  Yes Jodelle Gross, NP  warfarin (COUMADIN) 5 MG tablet Take 1 1/2 tablets daily except 1 tablet on Sundays and Thursdays 09/28/15  Yes Jodelle Gross, NP  nitroGLYCERIN (NITROSTAT) 0.4  MG SL tablet Place 1 tablet (0.4 mg total) under the tongue every 5 (five) minutes as needed. 09/03/14   Jonelle Sidle, MD  permethrin (ELIMITE) 5 % cream Apply to affected area once Patient not taking: Reported on 09/29/2015 07/18/14   Janne Napoleon, NP   BP 124/91 mmHg  Pulse 55  Temp(Src) 97.8 F (36.6 C) (Oral)  Resp 16  Ht  (1.702 m)  Wt 172 lb (78.019 kg)  BMI 26.93 kg/m2  SpO2 98% Physical Exam  Constitutional: He is oriented to person, place, and time. He appears well-developed and well-nourished. No distress.  HENT:  Head: Normocephalic and atraumatic.  Cardiovascular: Normal rate,  regular rhythm and intact distal pulses.   No murmur heard. Pulmonary/Chest: Effort normal and breath sounds normal. No respiratory distress. He exhibits no tenderness.  Abdominal: Soft. He exhibits no distension. There is no tenderness.  Musculoskeletal: Normal range of motion. He exhibits tenderness. He exhibits no edema.       Right hip: He exhibits tenderness. He exhibits no swelling and no crepitus.       Legs: ttp of the anterior right hip, pain reproduced with internal and external rotation.  No spinal tenderness.  No edema, erythema or excessive warmth.  Dp pulse brisk, distal sensation intact. 5/5 strength against resistance of bilateral LE's  Neurological: He is alert and oriented to person, place, and time. Coordination normal.  Skin: Skin is warm and dry.  Psychiatric: He has a normal mood and affect.    ED Course  Procedures (including critical care time) Labs Review Labs Reviewed  URINALYSIS, ROUTINE W REFLEX MICROSCOPIC (NOT AT [facility]) - Abnormal; Notable for the following:    Specific Gravity, Urine <1.005 (*)    Hgb urine dipstick TRACE (*)    All other components within normal limits  URINE MICROSCOPIC-ADD ON    Imaging Review Dg Hip Unilat With Pelvis 2-3 Views Right  09/29/2015   CLINICAL DATA:  Working in [employer] with subsequent popping sound in the hip with pain, initial encounter  EXAM: DG HIP (WITH OR WITHOUT PELVIS) 2-3V RIGHT  COMPARISON:  None.  FINDINGS: There is no evidence of hip fracture or dislocation. There is no evidence of arthropathy or other focal bone abnormality.  IMPRESSION: No acute abnormality noted.   Electronically Signed   By: Alcide Clever M.D.   On: 09/29/2015 18:22   I have personally reviewed and evaluated these images and lab results as part of my medical decision-making.   EKG Interpretation None      MDM   Final diagnoses:  Hip sprain, right, initial encounter    XR neg for fx.  U/a unremarkable.  Pain reproduced with movement of  the hip, internal and external rotation.  No concerning sx's for septic joint.  Exam c/w sprain.  Pt agrees to symptomatic tx and PMD f/u .  Ambulates with steady gait.  No focal neuro deficits  Appears stable for d/c    Pauline Aus, PA-C 10/01/15 1402  Laurence Spates, MD 10/02/15 [phone]

## 2015-09-29 NOTE — Discharge Instructions (Signed)
Ligament Sprain °A ligament sprain is when the bands of tissue that hold bones together (ligament) are stretched. °HOME CARE  °· Rest the injured area. °· Start using the joint when told to by your doctor. °· Keep the injured area raised (elevated) above the level of the heart. This may lessen puffiness (swelling). °· Put ice on the injured area. °¨ Put ice in a plastic bag. °¨ Place a towel between your skin and the bag. °¨ Leave the ice on for 15-20 minutes, 03-04 times a day. °· Wear a splint, cast, or an elastic bandage as told by your doctor. °· Only take medicine as told by your doctor. °· Use crutches as told by your doctor. Do not put weight on the injured joint until told to by your doctor. °GET HELP RIGHT AWAY IF:  °· You have more bruising, puffiness, or pain. °· The leg was injured and the toes are cold, tingling, numb, or blue. °· The arm was injured and the fingers are cold, tingling, numb, or blue. °· The pain is not helped with medicine. °· The pain gets worse. °MAKE SURE YOU:  °· Understand these instructions. °· Will watch this condition. °· Will get help right away if you are not doing well or get worse. °Document Released: 06/04/2008 Document Revised: 10/07/2013 Document Reviewed: 06/04/2008 °[employer]® Patient Information ©2015 [employer]. This information is not intended to replace advice given to you by your health care provider. Make sure you discuss any questions you have with your health care provider. ° °

## 2015-09-29 NOTE — ED Notes (Signed)
Pt reports 2 weeks ago was standing in his yard, turned around, and heard something pop in r hip.  Reports worsening pain since then.

## 2015-10-12 ENCOUNTER — Ambulatory Visit (INDEPENDENT_AMBULATORY_CARE_PROVIDER_SITE_OTHER): Payer: Self-pay | Admitting: *Deleted

## 2015-10-12 DIAGNOSIS — Z7901 Long term (current) use of anticoagulants: Secondary | ICD-10-CM

## 2015-10-12 DIAGNOSIS — I238 Other current complications following acute myocardial infarction: Secondary | ICD-10-CM

## 2015-10-12 DIAGNOSIS — I6350 Cerebral infarction due to unspecified occlusion or stenosis of unspecified cerebral artery: Secondary | ICD-10-CM

## 2015-10-12 DIAGNOSIS — Z5181 Encounter for therapeutic drug level monitoring: Secondary | ICD-10-CM

## 2015-10-12 LAB — POCT INR: INR: 2.1

## 2015-11-05 ENCOUNTER — Emergency Department (HOSPITAL_COMMUNITY): Payer: Medicaid Other

## 2015-11-05 ENCOUNTER — Encounter (HOSPITAL_COMMUNITY): Payer: Self-pay

## 2015-11-05 ENCOUNTER — Emergency Department (HOSPITAL_COMMUNITY)
Admission: EM | Admit: 2015-11-05 | Discharge: 2015-11-05 | Disposition: A | Discharge status: Home / Self Care | Payer: Medicaid Other | Attending: Emergency Medicine | Admitting: Emergency Medicine

## 2015-11-05 DIAGNOSIS — I2510 Atherosclerotic heart disease of native coronary artery without angina pectoris: Secondary | ICD-10-CM | POA: Diagnosis not present

## 2015-11-05 DIAGNOSIS — Z951 Presence of aortocoronary bypass graft: Secondary | ICD-10-CM | POA: Diagnosis not present

## 2015-11-05 DIAGNOSIS — Z79899 Other long term (current) drug therapy: Secondary | ICD-10-CM | POA: Insufficient documentation

## 2015-11-05 DIAGNOSIS — Z720 Tobacco use: Secondary | ICD-10-CM | POA: Insufficient documentation

## 2015-11-05 DIAGNOSIS — Y998 Other external cause status: Secondary | ICD-10-CM | POA: Insufficient documentation

## 2015-11-05 DIAGNOSIS — Y9289 Other specified places as the place of occurrence of the external cause: Secondary | ICD-10-CM | POA: Insufficient documentation

## 2015-11-05 DIAGNOSIS — F329 Major depressive disorder, single episode, unspecified: Secondary | ICD-10-CM | POA: Insufficient documentation

## 2015-11-05 DIAGNOSIS — M84353A Stress fracture, unspecified femur, initial encounter for fracture: Secondary | ICD-10-CM

## 2015-11-05 DIAGNOSIS — Z7982 Long term (current) use of aspirin: Secondary | ICD-10-CM | POA: Insufficient documentation

## 2015-11-05 DIAGNOSIS — X58XXXA Exposure to other specified factors, initial encounter: Secondary | ICD-10-CM | POA: Insufficient documentation

## 2015-11-05 DIAGNOSIS — I10 Essential (primary) hypertension: Secondary | ICD-10-CM | POA: Insufficient documentation

## 2015-11-05 DIAGNOSIS — Z8673 Personal history of transient ischemic attack (TIA), and cerebral infarction without residual deficits: Secondary | ICD-10-CM | POA: Insufficient documentation

## 2015-11-05 DIAGNOSIS — Y9389 Activity, other specified: Secondary | ICD-10-CM | POA: Insufficient documentation

## 2015-11-05 DIAGNOSIS — S72091A Other fracture of head and neck of right femur, initial encounter for closed fracture: Secondary | ICD-10-CM | POA: Diagnosis not present

## 2015-11-05 DIAGNOSIS — E785 Hyperlipidemia, unspecified: Secondary | ICD-10-CM | POA: Diagnosis not present

## 2015-11-05 DIAGNOSIS — S79911A Unspecified injury of right hip, initial encounter: Secondary | ICD-10-CM | POA: Diagnosis present

## 2015-11-05 DIAGNOSIS — Z7901 Long term (current) use of anticoagulants: Secondary | ICD-10-CM | POA: Diagnosis not present

## 2015-11-05 MED ORDER — DIAZEPAM 5 MG PO TABS
5.0000 mg | ORAL_TABLET | Freq: Once | ORAL | Status: AC
  Administered 2015-11-05: 5 mg via ORAL
  Filled 2015-11-05: qty 1

## 2015-11-05 MED ORDER — HYDROMORPHONE HCL 1 MG/ML IJ SOLN
1.0000 mg | Freq: Once | INTRAMUSCULAR | Status: AC
  Administered 2015-11-05: 1 mg via INTRAMUSCULAR
  Filled 2015-11-05: qty 1

## 2015-11-05 MED ORDER — OXYCODONE-ACETAMINOPHEN 5-325 MG PO TABS: 1.0000 | ORAL_TABLET | ORAL | Status: DC | PRN

## 2015-11-05 MED ORDER — METHOCARBAMOL 500 MG PO TABS: 500.0000 mg | ORAL_TABLET | Freq: Two times a day (BID) | ORAL | Status: DC | PRN

## 2015-11-05 MED ORDER — KETOROLAC TROMETHAMINE 30 MG/ML IJ SOLN
30.0000 mg | Freq: Once | INTRAMUSCULAR | Status: AC
  Administered 2015-11-05: 30 mg via INTRAMUSCULAR
  Filled 2015-11-05: qty 1

## 2015-11-05 NOTE — ED Provider Notes (Signed)
CSN: [id number]     Arrival date & time 11/05/15  1523 History   First MD Initiated Contact with Patient 11/05/15 1537     Chief Complaint  Patient presents with  . Hip Pain     (Consider location/radiation/quality/duration/timing/severity/associated sxs/prior Treatment) HPI   49 year old male with severe right hip/groin pain. Initially started several weeks ago. Patient had his right foot planted then forcefully externally rotated his right hip. Acute onset of severe hip pain at that time. He was evaluated in the emergency room and had negative imaging. Pain was initially tolerable with pain medication. Sniffily worsened over the past several days that any type of movement gives him excruciating pain. Does not radiate. No numbness or tingling.   Past Medical History  Diagnosis Date  . CAD (coronary artery disease)     Multivessel, LVEF 45-50%  . Cerebrovascular disease     Stroke 2004  . Hyperlipidemia   . Hypertension   . Left ventricular mural thrombus (HCC)     Apical  . Depression    Past Surgical History  Procedure Laterality Date  . Coronary artery bypass graft      DOR anterior ventricular restoration surgery 8/06, [facility] [place] to first diagonal , SVG to PLB, SVG to RVE branch of nondominant RCA  . Eye surgery    . Valve replacement     Family History  Problem Relation Age of Onset  . Hypertension Mother   . Diabetes Mother    Social History  Substance Use Topics  . Smoking status: Current Every Day Smoker -- 1.50 packs/day    Types: Cigarettes  . Smokeless tobacco: Never Used  . Alcohol Use: 1.2 oz/week    2 Cans of beer per week     Comment: 1-2 beers a day     Review of Systems  All systems reviewed and negative, other than as noted in HPI.   Allergies  Benadryl  Home Medications   Prior to Admission medications   Medication Sig Start Date End Date Taking? Authorizing Provider  aspirin EC 81 MG tablet Take 81 mg by mouth daily.   Yes  Historical Provider, MD  citalopram (CELEXA) 20 MG tablet Take 20 mg by mouth every evening.    Yes Historical Provider, MD  cyclobenzaprine (FLEXERIL) 10 MG tablet Take 1 tablet (10 mg total) by mouth 3 (three) times daily as needed. 09/29/15  Yes Tammy Triplett, PA-C  lisinopril (PRINIVIL,ZESTRIL) 20 MG tablet TAKE ONE TABLET BY MOUTH ONCE DAILY 08/29/15  Yes Jodelle Gross, NP  lovastatin (MEVACOR) 20 MG tablet Take 1 tablet (20 mg total) by mouth at bedtime. 12/06/14  Yes Jodelle Gross, NP  metoprolol tartrate (LOPRESSOR) 25 MG tablet Take 1 tablet (25 mg total) by mouth 2 (two) times daily. 12/06/14  Yes Jodelle Gross, NP  spironolactone (ALDACTONE) 25 MG tablet Take 0.5 tablets (12.5 mg total) by mouth daily. 12/06/14  Yes Jodelle Gross, NP  warfarin (COUMADIN) 5 MG tablet Take 1 1/2 tablets daily except 1 tablet on Sundays and Thursdays Patient taking differently: Take 5-7 mg by mouth daily. Take 1 1/2 tablets daily except 1 tablet on Sundays and Thursdays 09/28/15  Yes Jodelle Gross, NP  nitroGLYCERIN (NITROSTAT) 0.4 MG SL tablet Place 1 tablet (0.4 mg total) under the tongue every 5 (five) minutes as needed. 09/03/14   Jonelle Sidle, MD  oxyCODONE-acetaminophen (PERCOCET/ROXICET) 5-325 MG tablet Take 1 tablet by mouth every 4 (four) hours as  needed. 09/29/15   Tammy Triplett, PA-C   BP 138/72 mmHg  Pulse 74  Temp(Src) 98.4 F (36.9 C)  Resp 20  Ht  (1.702 m)  Wt 160 lb (72.576 kg)  BMI 25.05 kg/m2  SpO2 97% Physical Exam  Constitutional: He appears well-developed and well-nourished. No distress.  HENT:  Head: Normocephalic and atraumatic.  Eyes: Conjunctivae are normal. Right eye exhibits no discharge. Left eye exhibits no discharge.  Neck: Neck supple.  Cardiovascular: Normal rate, regular rhythm and normal heart sounds.  Exam reveals no gallop and no friction rub.   No murmur heard. Pulmonary/Chest: Effort normal and breath sounds normal. No respiratory  distress.  Abdominal: Soft. He exhibits no distension. There is no tenderness.  Musculoskeletal: He exhibits no edema or tenderness.  No shortening or malrotation. Severe pain with any attempted range of motion of the right hip. Neurovascularly intact distally.  Neurological: He is alert.  Skin: Skin is warm and dry.  Psychiatric: He has a normal mood and affect. His behavior is normal. Thought content normal.  Nursing note and vitals reviewed.   ED Course  Procedures (including critical care time) Labs Review Labs Reviewed - No data to display  Imaging Review Ct Hip Right Wo Contrast  11/05/2015  CLINICAL DATA:  Injured right hip on 09/29/2015. Persistent and worsening pain today. EXAM: CT OF THE RIGHT HIP WITHOUT CONTRAST TECHNIQUE: Multidetector CT imaging of the right hip was performed according to the standard protocol. Multiplanar CT image reconstructions were also generated. COMPARISON:  Radiographs 09/29/2015 FINDINGS: There is a subchondral lucency involving the right femoral head suspicious for a subchondral stress fracture. No definite CT findings for avascular necrosis. Small linear density near the acetabulum could be a small avulsion fracture. No femoral neck or intertrochanteric fracture. The visualized right bony pelvis is intact. IMPRESSION: Suspect subchondral stress fracture involving the right femoral head. MRI may be helpful for further evaluation. Possible small avulsion fracture involving the acetabulum. Electronically Signed   By: Rudie Meyer M.D.   On: 11/05/2015 17:46   I have personally reviewed and evaluated these images and lab results as part of my medical decision-making.   EKG Interpretation None      MDM   Final diagnoses:  Stress fracture of femoral neck, initial encounter    49 year old male with severe right hip pain. Imaging as above. Pt reluctant to not work, but advised that he really needs to stay off his right lower extremity the best he can.  Crutches. Pain medication. Ortho follow-up.   Raeford Razor, MD 11/17/15 1556

## 2015-11-05 NOTE — Discharge Instructions (Signed)
Stress Fracture Stress fracture is a small break or crack in a bone. A stress fracture can be fully broken (complete) or partially broken (incomplete). The most common sites for stress fractures are the bones in the front of your feet (metatarsals), your heels (calcaneus), and the long bone of your lower leg (tibia). CAUSES A stress fracture is caused by overuse or repetitive exercise, such as running. It happens when a bone cannot absorb any more shock because the muscles around it are weak. Stress fractures happen most commonly when:  You rapidly increase or start a new physical activity.  You use shoes that are worn out or do not fit you properly.  You exercise on a new surface. RISK FACTORS You may be at higher risk for this type of fracture if:  You have a condition that causes weak bones (osteoporosis).  You are male. Stress fractures are more likely to occur in women. SIGNS AND SYMPTOMS The most common symptom of a stress fracture is feeling pain when you are using the affected part of your body. The pain usually goes away when you are resting. Other symptoms may include:  Swelling of the affected area.  Pain in the area when it is touched.  Decreased pain while resting. Stress fracture pain usually develops over time. DIAGNOSIS Diagnosis may include:   Medical history and physical exam.  X-rays.  Bone scan.  MRI. TREATMENT Treatment depends on the severity of your stress fracture. Treatment usually involves resting, icing, compression, and elevation (RICE) of the affected part of your body. Treatment may also include:  Medicines to reduce inflammation.  A cast or a walking shoe.  Crutches.  Surgery. HOME CARE INSTRUCTIONS If You Have a Cast:  Do not stick anything inside the cast to scratch your skin. Doing that increases your risk of infection.  Check the skin around the cast every day. Report any concerns to your health care provider. You may put lotion  on dry skin around the edges of the cast. Do not apply lotion to the skin underneath the cast.  Keep the cast clean and dry.  Cover the cast with a watertight plastic bag to protect it from water while you take a bath or a shower. Do not let the cast get wet.  Do not put pressure on any part of the cast until it is fully hardened. This may take several hours. If You Have a Walking Shoe:  Wear it as directed by your health care provider. Managing Pain, Stiffness, and Swelling  If directed, apply ice to the injured area:  Put ice in a plastic bag.  Place a towel between your skin and the bag.  Leave the ice on for 20 minutes, 2-3 times per day.  Move your fingers or toes often to avoid stiffness and to lessen swelling.  Raise the injured area above the level of your heart while you are sitting or lying down. Activity  Rest as directed by your health care provider. Ask your health care provider if you may do alternative exercises, such as swimming or biking, while you are healing.  Return to your normal activities as directed by your health care provider. Ask your health care provider what activities are safe for you.  Perform range-of-motion exercises only as directed by your health care provider. Safety  Do not use the injured limb to support yourbody weight until your health care provider says that you can. Use crutches if your health care provider tells you to   do so. General Instructions  Do not use any tobacco products, including cigarettes, chewing tobacco, or electronic cigarettes. Tobacco can delay bone healing. If you need help quitting, ask your health care provider.  Take medicines only as directed by your health care provider.  Keep all follow-up visits as directed by your health care provider. This is important. PREVENTION  Only wear shoes that:  Fit well.  Are not worn out.  Eat a healthy diet that contains vitamin D and calcium. This helps keeps your bones  strong.  Be careful when you start a new physical activity. Give your body time to adjust.  Avoid doing only one kind of activity. Do different exercises, such as swimming and running, so that no single part of your body gets overused.  Do strength-training exercises. SEEK MEDICAL CARE IF:  Your pain gets worse.  You have new symptoms.  You have increased swelling. SEEK IMMEDIATE MEDICAL CARE IF:   You lose feeling in the affected area.   This information is not intended to replace advice given to you by your health care provider. Make sure you discuss any questions you have with your health care provider.   Document Released: 03/09/2003 Document Revised: 01/07/2015 Document Reviewed: 07/22/2014 Elsevier Interactive Patient Education 2016 [employer]  

## 2015-11-05 NOTE — ED Notes (Signed)
Patient here with right hip injury last month. Patient states hip pain is worsen and wraps around to right pelvic area, patient states he is unable to ambulate

## 2015-11-23 ENCOUNTER — Ambulatory Visit (INDEPENDENT_AMBULATORY_CARE_PROVIDER_SITE_OTHER): Payer: Self-pay | Admitting: *Deleted

## 2015-11-23 DIAGNOSIS — Z7901 Long term (current) use of anticoagulants: Secondary | ICD-10-CM

## 2015-11-23 DIAGNOSIS — Z5181 Encounter for therapeutic drug level monitoring: Secondary | ICD-10-CM

## 2015-11-23 DIAGNOSIS — I6350 Cerebral infarction due to unspecified occlusion or stenosis of unspecified cerebral artery: Secondary | ICD-10-CM

## 2015-11-23 DIAGNOSIS — I238 Other current complications following acute myocardial infarction: Secondary | ICD-10-CM

## 2015-11-23 LAB — POCT INR: INR: 2.3

## 2015-12-05 ENCOUNTER — Encounter (HOSPITAL_COMMUNITY): Payer: Self-pay | Admitting: Emergency Medicine

## 2015-12-05 ENCOUNTER — Emergency Department (HOSPITAL_COMMUNITY): Payer: Medicaid Other

## 2015-12-05 ENCOUNTER — Emergency Department (HOSPITAL_COMMUNITY)
Admission: EM | Admit: 2015-12-05 | Discharge: 2015-12-05 | Disposition: A | Discharge status: Home / Self Care | Payer: Medicaid Other | Attending: Emergency Medicine | Admitting: Emergency Medicine

## 2015-12-05 DIAGNOSIS — Z8673 Personal history of transient ischemic attack (TIA), and cerebral infarction without residual deficits: Secondary | ICD-10-CM | POA: Diagnosis not present

## 2015-12-05 DIAGNOSIS — Z7982 Long term (current) use of aspirin: Secondary | ICD-10-CM | POA: Diagnosis not present

## 2015-12-05 DIAGNOSIS — Z951 Presence of aortocoronary bypass graft: Secondary | ICD-10-CM | POA: Insufficient documentation

## 2015-12-05 DIAGNOSIS — M87052 Idiopathic aseptic necrosis of left femur: Secondary | ICD-10-CM

## 2015-12-05 DIAGNOSIS — E785 Hyperlipidemia, unspecified: Secondary | ICD-10-CM | POA: Insufficient documentation

## 2015-12-05 DIAGNOSIS — Z7901 Long term (current) use of anticoagulants: Secondary | ICD-10-CM | POA: Insufficient documentation

## 2015-12-05 DIAGNOSIS — M545 Low back pain: Secondary | ICD-10-CM | POA: Insufficient documentation

## 2015-12-05 DIAGNOSIS — M87851 Other osteonecrosis, right femur: Secondary | ICD-10-CM | POA: Diagnosis not present

## 2015-12-05 DIAGNOSIS — M25559 Pain in unspecified hip: Secondary | ICD-10-CM

## 2015-12-05 DIAGNOSIS — F329 Major depressive disorder, single episode, unspecified: Secondary | ICD-10-CM | POA: Insufficient documentation

## 2015-12-05 DIAGNOSIS — Z79899 Other long term (current) drug therapy: Secondary | ICD-10-CM | POA: Insufficient documentation

## 2015-12-05 DIAGNOSIS — M25551 Pain in right hip: Secondary | ICD-10-CM | POA: Diagnosis not present

## 2015-12-05 DIAGNOSIS — I2510 Atherosclerotic heart disease of native coronary artery without angina pectoris: Secondary | ICD-10-CM | POA: Diagnosis not present

## 2015-12-05 DIAGNOSIS — M87051 Idiopathic aseptic necrosis of right femur: Secondary | ICD-10-CM

## 2015-12-05 DIAGNOSIS — I10 Essential (primary) hypertension: Secondary | ICD-10-CM | POA: Insufficient documentation

## 2015-12-05 DIAGNOSIS — F17210 Nicotine dependence, cigarettes, uncomplicated: Secondary | ICD-10-CM | POA: Insufficient documentation

## 2015-12-05 DIAGNOSIS — R200 Anesthesia of skin: Secondary | ICD-10-CM | POA: Diagnosis not present

## 2015-12-05 MED ORDER — HYDROCODONE-ACETAMINOPHEN 5-325 MG PO TABS: 1.0000 | ORAL_TABLET | ORAL | Status: DC | PRN

## 2015-12-05 MED ORDER — ONDANSETRON HCL 4 MG PO TABS
4.0000 mg | ORAL_TABLET | Freq: Once | ORAL | Status: AC
  Administered 2015-12-05: 4 mg via ORAL
  Filled 2015-12-05: qty 1

## 2015-12-05 MED ORDER — PREDNISONE 10 MG PO TABS: ORAL_TABLET | ORAL | Status: DC

## 2015-12-05 MED ORDER — TRAMADOL HCL 50 MG PO TABS
100.0000 mg | ORAL_TABLET | Freq: Once | ORAL | Status: AC
  Administered 2015-12-05: 100 mg via ORAL
  Filled 2015-12-05: qty 2

## 2015-12-05 MED ORDER — DIAZEPAM 5 MG PO TABS
10.0000 mg | ORAL_TABLET | Freq: Once | ORAL | Status: AC
  Administered 2015-12-05: 10 mg via ORAL
  Filled 2015-12-05: qty 2

## 2015-12-05 MED ORDER — KETOROLAC TROMETHAMINE 10 MG PO TABS
10.0000 mg | ORAL_TABLET | Freq: Once | ORAL | Status: AC
  Administered 2015-12-05: 10 mg via ORAL
  Filled 2015-12-05: qty 1

## 2015-12-05 NOTE — ED Notes (Signed)
Pt had fall in September and came to ED and was told he had strained ligament. Pt continued to have pain and 1 month ago came back to ED to be reevaluated and was found to have hairline fx of right hip. Pt was given leg immobilizer and crutches to use. Pt had another fall on Sat. and has been having increased pain since. Pt presents to ED with leg immobilizer and crutches.

## 2015-12-05 NOTE — Discharge Instructions (Signed)
YOur MRI reveals the fracture of the right hip has not changed as a result of your fall. 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 It is extremely important that you see an orthopedic MD as soon as possible. Dr Eulah Pont would like to see you in the office soon. Avascular Necrosis Avascular necrosis is a disease resulting from the temporary or permanent loss of blood supply to a bone. This disease may also be known as:   Osteonecrosis.   Aseptic necrosis.   Ischemic bone necrosis. Without proper blood supply, the internal layer of the affected bone dies and the outer layer of the bone may break down. If this process affects a bone near a joint, it may lead to collapse of that joint. Common bones that are affected by this condition include:  The top of your thigh bone (femoral head).  One or more bones in your wrist (scaphoid orlunate).  One or more bones in your foot (metatarsals).  One of the bones in your ankle (navicular). The joint most commonly affected by this condition is the hip joint. Avascular necrosis rarely occurs in more than one bone at a time.  CAUSES   Damage or injury to a bone or joint.  Using corticosteroid medicine for a long period of time.  Changes in your immune or hormone systems.  Excessive exposure to radiation. RISK FACTORS  Alcohol abuse.  Previous traumatic injury to a joint.  Using corticosteroid medicines for a long period of time or often.  Having a medical condition such as:  HIV or AIDS.   Diabetes.   Sickle cell disease.  An autoimmune disease. SIGNS AND SYMPTOMS  The main symptoms of avascular necrosis are pain and decreased motion in the affected bone or joint. In the early stages the pain may be minor and occur only with activity. As avascular necrosis progresses, pain may gradually worsen and occur while at rest. The pain may suddenly become severe if an affected joint collapses. DIAGNOSIS  Avascular  necrosis may be diagnosed with:   A medical history.  A physical exam.   X-rays.  An MRI.  A bone scan. TREATMENT  Treatments may include:  Medicine to help relieve pain.  Avoiding placing any pressure or weight ontheaffected area. If avascular necrosis occurs in your hip, ankle, or foot, you may be instructed to use crutches or a rolling scooter.  Surgery, such as:   Core decompression. In this surgery, one or more holes are placed in the bone for new blood vessels to grow into. This provides a renewed blood supply to the bone. Core decompression can often reduce pain and pressure in the affected bone and slow the progression of bone and joint destruction.  Osteotomy. In this surgery, the bone is reshaped to reduce stress on the affected area of the joint.   Bone grafting. In this surgery, healthy bone from one part of your body is transplanted to the affected area.   Arthroplasty. Arthroplasty is also known as total joint replacement. In this surgery, the affected surface on one or both sides of a joint is replaced with artificial parts (prostheses).  Electrical stimulation. This may help encourage new bone growth. HOME CARE INSTRUCTIONS  Take medicines only as directed by your health care provider.   Follow your health care provider's recommendations on limiting activities or using crutches to rest your affected joint.   Meet with aphysical therapist as directed by your health care provider.   Keep all  follow-up visits as directed by your health care provider. This is important. SEEK MEDICAL CARE IF:   Your pain worsens.  You have decreased motion in your affected joint. SEEK IMMEDIATE MEDICAL CARE IF:  Your pain suddenly becomes severe.   This information is not intended to replace advice given to you by your health care provider. Make sure you discuss any questions you have with your health care provider.   Document Released: 06/08/2002 Document Revised:  01/07/2015 Document Reviewed: 02/24/2014 Elsevier Interactive Patient Education [employer]

## 2015-12-05 NOTE — ED Notes (Signed)
PA at bedside.

## 2015-12-05 NOTE — ED Notes (Signed)
Injury to right hip in Sept and here today because fell on Saturday and re-injured right hip, rates pain 10/10.

## 2015-12-05 NOTE — ED Provider Notes (Signed)
CSN: [id number]     Arrival date & time 12/05/15  0932 History   First MD Initiated Contact with Patient 12/05/15 [phone]     Chief Complaint  Patient presents with  . Hip Pain     (Consider location/radiation/quality/duration/timing/severity/associated sxs/prior Treatment) HPI Comments: Patient is a 49 year old male who presents to the emergency department with a complaint of right hip pain. The patient states that he sustained an injury to his back and hip area. He has been evaluated in the emergency department. Initially the x-rays were read as negative. The patient then had a CT scan of his lumbar spine because of increasing pain. The patient was noted to have a hairline fracture, versus an area of avascular necrosis. The patient was advised to see an orthopedic specialist. Patient states that he does not have insurance, and does not have the cash to see a specialist. The patient states that 2 days ago he sustained a fall and has noticed that his pain is increasing since that time. He states that when he had a fall on Saturday that he wet his clothes. He denies any loss of control of bowels. He now has problems putting any weight on the right hip and lower extremity. He reports that someone else in the home had to dress him today because of the severity of his pain. He has not had any numbness of the groin. He presents now for assistance with the pain and for additional evaluation.  The history is provided by the patient.    Past Medical History  Diagnosis Date  . CAD (coronary artery disease)     Multivessel, LVEF 45-50%  . Cerebrovascular disease     Stroke 2004  . Hyperlipidemia   . Hypertension   . Left ventricular mural thrombus (HCC)     Apical  . Depression    Past Surgical History  Procedure Laterality Date  . Coronary artery bypass graft      DOR anterior ventricular restoration surgery 8/06, [facility] - LIMA to first diagonal , SVG to PLB, SVG to RVE branch of  nondominant RCA  . Eye surgery    . Valve replacement     Family History  Problem Relation Age of Onset  . Hypertension Mother   . Diabetes Mother    Social History  Substance Use Topics  . Smoking status: Current Every Day Smoker -- 1.50 packs/day    Types: Cigarettes  . Smokeless tobacco: Never Used  . Alcohol Use: 1.2 oz/week    2 Cans of beer per week     Comment: 1-2 beers a day     Review of Systems  Musculoskeletal: Positive for back pain.  Neurological: Positive for numbness.  All other systems reviewed and are negative.     Allergies  Benadryl  Home Medications   Prior to Admission medications   Medication Sig Start Date End Date Taking? Authorizing Provider  aspirin EC 81 MG tablet Take 81 mg by mouth daily.   Yes Historical Provider, MD  citalopram (CELEXA) 20 MG tablet Take 20 mg by mouth every evening.    Yes Historical Provider, MD  lisinopril (PRINIVIL,ZESTRIL) 20 MG tablet TAKE ONE TABLET BY MOUTH ONCE DAILY 08/29/15  Yes Jodelle Gross, NP  lovastatin (MEVACOR) 20 MG tablet Take 1 tablet (20 mg total) by mouth at bedtime. 12/06/14  Yes Jodelle Gross, NP  metoprolol tartrate (LOPRESSOR) 25 MG tablet Take 1 tablet (25 mg total) by mouth 2 (two) times daily.  12/06/14  Yes Jodelle Gross, NP  nitroGLYCERIN (NITROSTAT) 0.4 MG SL tablet Place 1 tablet (0.4 mg total) under the tongue every 5 (five) minutes as needed. 09/03/14  Yes Jonelle Sidle, MD  spironolactone (ALDACTONE) 25 MG tablet Take 0.5 tablets (12.5 mg total) by mouth daily. 12/06/14  Yes Jodelle Gross, NP  warfarin (COUMADIN) 5 MG tablet Take 1 1/2 tablets daily except 1 tablet on Sundays and Thursdays Patient taking differently: Take 5-7 mg by mouth daily. Take 1 1/2 tablets daily except 1 tablet on Sundays and Thursdays 09/28/15  Yes Jodelle Gross, NP   BP 136/98 mmHg  Pulse 68  Temp(Src) 98.3 F (36.8 C) (Oral)  Resp 15  Ht  (1.702 m)  Wt 73.483 kg  BMI 25.37 kg/m2   SpO2 98% Physical Exam  Constitutional: He is oriented to person, place, and time. He appears well-developed and well-nourished.  Non-toxic appearance.  HENT:  Head: Normocephalic.  Right Ear: Tympanic membrane and external ear normal.  Left Ear: Tympanic membrane and external ear normal.  Eyes: EOM and lids are normal. Pupils are equal, round, and reactive to light.  Neck: Normal range of motion. Neck supple. Carotid bruit is not present.  Cardiovascular: Normal rate, regular rhythm, normal heart sounds, intact distal pulses and normal pulses.   Pulmonary/Chest: Breath sounds normal. No respiratory distress.  Abdominal: Soft. Bowel sounds are normal. There is no tenderness. There is no guarding.  Musculoskeletal: Normal range of motion.       Lumbar back: He exhibits tenderness, pain and spasm.  Lymphadenopathy:       Head (right side): No submandibular adenopathy present.       Head (left side): No submandibular adenopathy present.    He has no cervical adenopathy.  Neurological: He is alert and oriented to person, place, and time. He has normal strength. No cranial nerve deficit or sensory deficit.  Skin: Skin is warm and dry.  Psychiatric: He has a normal mood and affect. His speech is normal.  Nursing note and vitals reviewed.   ED Course  Procedures (including critical care time) Labs Review Labs Reviewed - No data to display  Imaging Review Ct Hip Right Wo Contrast  12/05/2015  CLINICAL DATA:  Recent fall 12/03/2015.  Prior fall 11/05/2015. EXAM: CT OF THE RIGHT HIP WITHOUT CONTRAST TECHNIQUE: Multidetector CT imaging of the right hip was performed according to the standard protocol. Multiplanar CT image reconstructions were also generated. COMPARISON:  11/05/2015 FINDINGS: Subchondral linear lucency involving the superior right femoral head most consistent with a subchondral insufficiency fracture avascular necrosis with a subchondral fracture. Patchy sclerosis in the right  femoral head. No dislocation. Joint space is relatively well maintained. Intact superior and inferior right pubic rami. Intact visualize right inferior SI joint. Normal muscles. No fluid collection or hematoma. No right inguinal lymphadenopathy. No inguinal hernia. IMPRESSION: 1. Patchy sclerosis in the right femoral head with a subchondral linear lucency. This may reflect a subchondral stress/insufficiency fracture versus avascular necrosis with a subchondral fracture. Further evaluation with MRI without intravenous contrast is recommended. Electronically Signed   By: Elige Ko   On: 12/05/2015 11:32   Mr Hip Right Wo Contrast  12/05/2015  CLINICAL DATA:  Right hip pain since September 2016. EXAM: MR OF THE RIGHT HIP WITHOUT CONTRAST TECHNIQUE: Multiplanar, multisequence MR imaging was performed. No intravenous contrast was administered. COMPARISON:  CT hip 12/05/2015, 11/05/2015 FINDINGS: Bones: Severe marrow edema in the right femoral head and  femoral neck with a subchondral linear signal abnormality in the superior femoral head consistent with a subchondral fracture. Serpiginous linear signal abnormality in the remainder of the right femoral head. Subchondral marrow edema in the superomedial right acetabulum likely reactive. Severe marrow edema in the left femoral head and neck with serpiginous signal abnormality in the left femoral head. No dislocation. Normal sacroiliac joints.  No aggressive osseous lesion. Articular cartilage and labrum Articular cartilage:  No focal chondral defect. Labrum:  Intact. Joint or bursal effusion Joint effusion: Moderate right hip joint effusion with synovitis. Small left hip joint effusion. Bursae:  No bursa formation. Muscles and tendons Flexors: Normal. Extensors: Normal. Abductors: Normal. Adductors: Normal. Rotators: Normal. Hamstrings: Normal. Other findings Miscellaneous: No pelvic free fluid. No inguinal lymphadenopathy. No inguinal hernia. IMPRESSION: 1. Avascular  necrosis of the right hip with severe marrow edema in the right femoral head and neck with a subchondral fracture of the superior right femoral head without articular surface collapse. Moderate right hip joint effusion with synovitis. Mild subchondral marrow edema in the superomedial right acetabulum likely reactive secondary to adjacent inflammation. Orthopedic consultation is recommended. 2. Avascular necrosis of the left hip with severe marrow edema in the left femoral head and neck without articular surface collapse. Small joint effusion. Electronically Signed   By: Elige Ko   On: 12/05/2015 13:24   I have personally reviewed and evaluated these images and lab results as part of my medical decision-making.   EKG Interpretation None      MDM  Vital signs well within normal limits. MRI of the left hip reveals bilateral avascular necrosis, as well as subchondral fracture of the superior right femoral head without surface collapse. Discussed the need for orthopedic evaluation. Patient states that he does not have insurance, and he does not have the funds to see a physician.  Call placed to medical social worker Call placed to Dr. Wandra Feinstein. He can see pt in the office for injection. Prescription for prednisone and Norco. The social worker is attempting to arrange resources to assist the patient.    Final diagnoses:  Hip pain    **I have reviewed nursing notes, vital signs, and all appropriate lab and imaging results for this patient.Steve Quale, PA-C 12/05/15 1737  Rolland Porter, MD 12/14/15 [phone]

## 2015-12-05 NOTE — Progress Notes (Signed)
CM consulted by CSW in regards to pt needing medication assistance. Pt discharged on hydrocodone and prednisone. Voucher will not cover pain medication and prednisone is on the $4 list at [facility]. No voucher given at this time. Pts ED RN aware.

## 2015-12-05 NOTE — Clinical Social Work Note (Signed)
CSW received call from ED regarding insurance and medication assistance. Artist and CM notified of requests.  Derenda Fennel, LCSW [phone]

## 2015-12-05 NOTE — ED Notes (Signed)
Patient with no complaints at this time. Respirations even and unlabored. Skin warm/dry. Discharge instructions reviewed with patient at this time. Patient given opportunity to voice concerns/ask questions. Patient discharged at this time and left Emergency Department with steady gait.   

## 2015-12-08 ENCOUNTER — Ambulatory Visit (INDEPENDENT_AMBULATORY_CARE_PROVIDER_SITE_OTHER): Payer: Self-pay | Admitting: Cardiology

## 2015-12-08 ENCOUNTER — Encounter: Payer: Self-pay | Admitting: Cardiology

## 2015-12-08 VITALS — BP 150/80 | HR 64 | Ht 67.5 in | Wt 169.0 lb

## 2015-12-08 DIAGNOSIS — I255 Ischemic cardiomyopathy: Secondary | ICD-10-CM

## 2015-12-08 DIAGNOSIS — I2510 Atherosclerotic heart disease of native coronary artery without angina pectoris: Secondary | ICD-10-CM

## 2015-12-08 DIAGNOSIS — Z01810 Encounter for preprocedural cardiovascular examination: Secondary | ICD-10-CM

## 2015-12-08 DIAGNOSIS — I10 Essential (primary) hypertension: Secondary | ICD-10-CM

## 2015-12-08 DIAGNOSIS — Z8673 Personal history of transient ischemic attack (TIA), and cerebral infarction without residual deficits: Secondary | ICD-10-CM

## 2015-12-08 DIAGNOSIS — E782 Mixed hyperlipidemia: Secondary | ICD-10-CM

## 2015-12-08 NOTE — Progress Notes (Signed)
Cardiology Office Note  Date: 12/08/2015   ID: Steve Vang, DOB 09/30/1966, MRN [id number]  PCP: [name]., PA-C  Primary Cardiologist: [name], MD   Chief Complaint  Patient presents with  . Coronary Artery Disease  . Cardiomyopathy   History of Present Illness: Steve Vang is a 49 y.o. male last seen by [name] NP in June. I have not seen him in the office since 2012. He is here today with his wife. He reports that he injured his right hip sometime within the last few months, has been using crutches. He has had increased pain and just recently had an MRI that shows avascular necrosis involving both hips with severe marrow edema, also subchondral fracture of the right femoral head associated with effusion and synovitis. Orthopedic consultation is pending.  From a cardiac perspective, he does not report any significant angina symptoms. He has not required any nitroglycerin recently. Reviewed his medications and he reports compliance except that he has run out of lisinopril.  Last echocardiogram was in 2011 as detailed below. He has not had a more recent follow-up study. He reports NYHA class 1-2 dyspnea. 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  He has not had a recent lipid panel, but has continued on Mevacor.  He continues on Coumadin with prior history of stroke and LV mural thrombus with associated cardiomyopathy, followed in the anticoagulation clinic. Most recent INR was therapeutic. He does not report any bleeding problems.  ECG today shows sinus rhythm with old anterior/lateral infarct pattern, nonspecific ST-T changes. Residual ST segment abnormalities could reflect aneurysmal LV wall motion.   Past Medical History  Diagnosis Date  . CAD (coronary artery disease)     Multivessel, LVEF 45-50%  . Cerebrovascular disease     Stroke 2004  . Hyperlipidemia   . Hypertension   . Left ventricular  mural thrombus (HCC)     Apical  . Depression     Past Surgical History  Procedure Laterality Date  . Coronary artery bypass graft      DOR anterior ventricular restoration surgery 8/06, [facility] - [facility] to first diagonal, SVG to PLB, SVG to RVE branch of nondominant RCA  . Eye surgery      Current Outpatient Prescriptions  Medication Sig Dispense Refill  . aspirin EC 81 MG tablet Take 81 mg by mouth daily.    . citalopram (CELEXA) 20 MG tablet Take 20 mg by mouth every evening.     Marland Kitchen HYDROcodone-acetaminophen (NORCO/VICODIN) 5-325 MG tablet Take 1-2 tablets by mouth every 4 (four) hours as needed. 20 tablet 0  . lisinopril (PRINIVIL,ZESTRIL) 20 MG tablet TAKE ONE TABLET BY MOUTH ONCE DAILY 90 tablet 3  . lovastatin (MEVACOR) 20 MG tablet Take 1 tablet (20 mg total) by mouth at bedtime. 30 tablet 11  . metoprolol tartrate (LOPRESSOR) 25 MG tablet Take 1 tablet (25 mg total) by mouth 2 (two) times daily. 180 tablet 3  . nitroGLYCERIN (NITROSTAT) 0.4 MG SL tablet Place 1 tablet (0.4 mg total) under the tongue every 5 (five) minutes as needed. 25 tablet 3  . predniSONE (DELTASONE) 10 MG tablet 5,4,3,2,1 - take with food 15 tablet 0  . spironolactone (ALDACTONE) 25 MG tablet Take 0.5 tablets (12.5 mg total) by mouth daily. 45 tablet 3  . warfarin (COUMADIN) 5 MG tablet Take 1 1/2 tablets daily except 1 tablet on Sundays and Thursdays (Patient taking differently:  Take 5-7 mg by mouth daily. Take 1 1/2 tablets daily except 1 tablet on Sundays and Thursdays) 45 tablet 3   No current facility-administered medications for this visit.   Allergies:  Benadryl   Social History: The patient  reports that he has been smoking Cigarettes.  He has been smoking about 1.50 packs per day. He has never used smokeless tobacco. He reports that he drinks about 1.2 oz of alcohol per week. He reports that he does not use illicit drugs.   ROS:  Please see the history of present illness. Otherwise, complete  review of systems is positive for right hip pain.  All other systems are reviewed and negative.   Physical Exam: VS:  BP 150/80 mmHg  Pulse 64  Ht 5' 7.5" (1.715 m)  Wt 169 lb (76.658 kg)  BMI 26.06 kg/m2  SpO2 98%, BMI Body mass index is 26.06 kg/(m^2).  Wt Readings from Last 3 Encounters:  12/08/15 169 lb (76.658 kg)  12/05/15 162 lb (73.483 kg)  11/05/15 160 lb (72.576 kg)    General: Disheveled male, appears comfortable at rest. HEENT: Conjunctiva and lids normal, oropharynx clear with poor dentition. Neck: Supple, no elevated JVP or carotid bruits, no thyromegaly. Lungs: Clear to auscultation, nonlabored breathing at rest. Cardiac: Regular rate and rhythm, no S3 or significant systolic murmur, no pericardial rub. Abdomen: Soft, nontender, bowel sounds present, no guarding or rebound. Extremities: No pitting edema, distal pulses 2+. Skin: Warm and dry. Musculoskeletal: No kyphosis. Neuropsychiatric: Alert and oriented x3, affect grossly appropriate.  ECG: ECG is ordered today.  Recent Labwork:  November 2016: INR 2.3  Other Studies Reviewed Today:  Echocardiogram 06/08/2010: Study Conclusions  - Left ventricle: The cavity size was mildly dilated. There was mild  hypertrophy of the septum. Systolic function was mildly reduced.  The estimated ejection fraction was in the range of 45% to 50%.  Akinesis of the distal anteroseptal and apical myocardium. There  was a small, flat (mural), fixed, apical thrombus associated with  an akinetic segment. - Mitral valve: Calcified annulus. - Atrial septum: No defect or patent foramen ovale was identified.  Assessment and Plan:  1. Symptomatically stable CAD status post CABG in 2006 as outlined above. He reports no significant angina with activities well exceeding 4 METs and our plan will be to continue medical therapy and observation without follow-up ischemic testing at this time. ECG reviewed.  2. History of  cardiomyopathy and LV mural thrombus status post ventricular restoration surgery at the time of CABG in 2006. Last LVEF was in 2011. Follow-up study will be arranged. He continues on beta blocker, Aldactone and ACE inhibitor.  3. Hyperlipidemia, on Mevacor. No recent lipid panel. Will obtain FLP.  4. History of previous stroke, cardiomyopathy, and LV mural thrombus. He continues on Coumadin with follow-up in the anticoagulation clinic.  5. Essential hypertension, blood pressure is elevated today. He has run out of lisinopril, this will be refilled.  6. Bilateral hip disease as detailed above, orthopedic consultation pending. It sounds like he will likely need surgery on his right hip in the near future. Do not anticipate any holdup from a cardiac perspective, although we will review his echocardiogram first. Continue medical therapy. He would need to come off Coumadin temporarily, most likely with bridging in light of his history.  Current medicines were reviewed with the patient today.   Orders Placed This Encounter  Procedures  . EKG 12-Lead  . Echocardiogram    Disposition: FU with me  in 6 months.   Signed, Jonelle Sidle, MD, [facility] 12/08/2015 12:17 PM    [facility] at [facility] [address] [place], [place] [address] Phone: [phone]; Fax: [phone]

## 2015-12-08 NOTE — Patient Instructions (Signed)
Your physician wants you to follow-up in: 6 months with Dr Randa Spike will receive a reminder letter in the mail two months in advance. If you don't receive a letter, please call our office to schedule the follow-up appointment.    Your physician recommends that you continue on your current medications as directed. Please refer to the Current Medication list given to you today.   If you need a refill on your cardiac medications before your next appointment, please call your pharmacy.   Your physician has requested that you have an echocardiogram. Echocardiography is a painless test that uses sound waves to create images of your heart. It provides your doctor with information about the size and shape of your heart and how well your heart's chambers and valves are working. This procedure takes approximately one hour. There are no restrictions for this procedure.   Your physician recommends that you return for lab work in: FASTING lipid profile     Thank you for choosing [facility] !

## 2015-12-15 ENCOUNTER — Ambulatory Visit: Payer: Self-pay | Admitting: Cardiology

## 2015-12-28 ENCOUNTER — Ambulatory Visit (HOSPITAL_COMMUNITY)
Admission: RE | Admit: 2015-12-28 | Discharge: 2015-12-28 | Disposition: A | Discharge status: Home / Self Care | Payer: Medicaid Other | Source: Ambulatory Visit | Attending: Cardiology | Admitting: Cardiology

## 2015-12-28 ENCOUNTER — Ambulatory Visit (INDEPENDENT_AMBULATORY_CARE_PROVIDER_SITE_OTHER): Payer: Self-pay | Admitting: *Deleted

## 2015-12-28 DIAGNOSIS — Z5181 Encounter for therapeutic drug level monitoring: Secondary | ICD-10-CM

## 2015-12-28 DIAGNOSIS — I10 Essential (primary) hypertension: Secondary | ICD-10-CM | POA: Diagnosis not present

## 2015-12-28 DIAGNOSIS — E785 Hyperlipidemia, unspecified: Secondary | ICD-10-CM | POA: Insufficient documentation

## 2015-12-28 DIAGNOSIS — I238 Other current complications following acute myocardial infarction: Secondary | ICD-10-CM

## 2015-12-28 DIAGNOSIS — Z720 Tobacco use: Secondary | ICD-10-CM | POA: Diagnosis not present

## 2015-12-28 DIAGNOSIS — I6350 Cerebral infarction due to unspecified occlusion or stenosis of unspecified cerebral artery: Secondary | ICD-10-CM

## 2015-12-28 DIAGNOSIS — I2510 Atherosclerotic heart disease of native coronary artery without angina pectoris: Secondary | ICD-10-CM | POA: Diagnosis not present

## 2015-12-28 DIAGNOSIS — Z7901 Long term (current) use of anticoagulants: Secondary | ICD-10-CM

## 2015-12-28 LAB — POCT INR: INR: 2.5

## 2016-02-08 ENCOUNTER — Ambulatory Visit (INDEPENDENT_AMBULATORY_CARE_PROVIDER_SITE_OTHER): Payer: Self-pay | Admitting: *Deleted

## 2016-02-08 DIAGNOSIS — I513 Intracardiac thrombosis, not elsewhere classified: Secondary | ICD-10-CM

## 2016-02-08 DIAGNOSIS — Z5181 Encounter for therapeutic drug level monitoring: Secondary | ICD-10-CM

## 2016-02-08 DIAGNOSIS — I213 ST elevation (STEMI) myocardial infarction of unspecified site: Secondary | ICD-10-CM

## 2016-02-08 LAB — POCT INR: INR: 2.7

## 2016-03-08 ENCOUNTER — Other Ambulatory Visit: Payer: Self-pay | Admitting: Adult Health

## 2016-03-09 ENCOUNTER — Telehealth: Payer: Self-pay | Admitting: Cardiology

## 2016-03-09 MED ORDER — METOPROLOL TARTRATE 25 MG PO TABS: 25.0000 mg | ORAL_TABLET | Freq: Two times a day (BID) | ORAL | Status: DC

## 2016-03-09 NOTE — Telephone Encounter (Signed)
Needs refill on Metorprolol sent to [facility]. / tg

## 2016-03-21 ENCOUNTER — Ambulatory Visit (INDEPENDENT_AMBULATORY_CARE_PROVIDER_SITE_OTHER): Payer: Self-pay | Admitting: *Deleted

## 2016-03-21 DIAGNOSIS — Z5181 Encounter for therapeutic drug level monitoring: Secondary | ICD-10-CM

## 2016-03-21 DIAGNOSIS — I513 Intracardiac thrombosis, not elsewhere classified: Secondary | ICD-10-CM

## 2016-03-21 DIAGNOSIS — I213 ST elevation (STEMI) myocardial infarction of unspecified site: Secondary | ICD-10-CM

## 2016-03-21 LAB — POCT INR: INR: 3.6

## 2016-03-21 MED ORDER — WARFARIN SODIUM 5 MG PO TABS: ORAL_TABLET | ORAL | Status: DC

## 2016-04-11 ENCOUNTER — Telehealth: Payer: Self-pay | Admitting: Cardiology

## 2016-04-11 ENCOUNTER — Ambulatory Visit (INDEPENDENT_AMBULATORY_CARE_PROVIDER_SITE_OTHER): Payer: Medicaid Other | Admitting: *Deleted

## 2016-04-11 DIAGNOSIS — Z5181 Encounter for therapeutic drug level monitoring: Secondary | ICD-10-CM | POA: Diagnosis not present

## 2016-04-11 DIAGNOSIS — I213 ST elevation (STEMI) myocardial infarction of unspecified site: Secondary | ICD-10-CM

## 2016-04-11 DIAGNOSIS — I513 Intracardiac thrombosis, not elsewhere classified: Secondary | ICD-10-CM

## 2016-04-11 LAB — POCT INR: INR: 2.7

## 2016-04-11 NOTE — Telephone Encounter (Signed)
Pt has requested we send his surgical clearance to Dr. Mort Sawyers office, they are scheduling the pt for hip surgery

## 2016-04-11 NOTE — Telephone Encounter (Signed)
Patient has not seen Dr Romeo Apple yet,wife states they are waiting for pcp to make apt.i told wife after they see dr Romeo Apple we will send him what he needs from cardiology

## 2016-04-19 ENCOUNTER — Encounter: Payer: Self-pay | Admitting: Orthopaedic Surgery

## 2016-04-19 ENCOUNTER — Ambulatory Visit (INDEPENDENT_AMBULATORY_CARE_PROVIDER_SITE_OTHER): Payer: Medicaid Other | Admitting: Orthopaedic Surgery

## 2016-04-19 ENCOUNTER — Ambulatory Visit (INDEPENDENT_AMBULATORY_CARE_PROVIDER_SITE_OTHER): Payer: Medicaid Other

## 2016-04-19 VITALS — BP 173/91 | HR 58 | Temp 97.5°F | Ht 67.0 in | Wt 163.0 lb

## 2016-04-19 DIAGNOSIS — M87052 Idiopathic aseptic necrosis of left femur: Secondary | ICD-10-CM

## 2016-04-19 DIAGNOSIS — M25551 Pain in right hip: Secondary | ICD-10-CM

## 2016-04-19 DIAGNOSIS — I10 Essential (primary) hypertension: Secondary | ICD-10-CM | POA: Diagnosis not present

## 2016-04-19 DIAGNOSIS — M25552 Pain in left hip: Secondary | ICD-10-CM

## 2016-04-19 DIAGNOSIS — M87051 Idiopathic aseptic necrosis of right femur: Secondary | ICD-10-CM | POA: Diagnosis not present

## 2016-04-19 DIAGNOSIS — I2510 Atherosclerotic heart disease of native coronary artery without angina pectoris: Secondary | ICD-10-CM

## 2016-04-19 NOTE — Progress Notes (Signed)
Subjective:    Patient ID: Steve Vang, male    DOB: 1966/06/27, 50 y.o.   MRN: [id number]  Hip Pain  There was no injury mechanism. The pain is present in the left hip and right hip. The quality of the pain is described as aching. The pain is at a severity of 5/10. The pain is moderate. The pain has been worsening since onset. Associated symptoms include an inability to bear weight and a loss of motion. Pertinent negatives include no loss of sensation, muscle weakness, numbness or tingling. The symptoms are aggravated by weight bearing. He has tried ice, heat, non-weight bearing and rest for the symptoms. The treatment provided no relief.   He has had pain in both hips for some time now.  On December 15, 2015 he had CT of both hips.  The findings were: IMPRESSION: 1. Patchy sclerosis in the right femoral head with a subchondral linear lucency. This may reflect a subchondral stress/insufficiency fracture versus avascular necrosis with a subchondral fracture. Further evaluation with MRI without intravenous contrast is recommended. 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  He has waited until now to get his Medicaid insurance.  He is on crutches and has pain with any weight bearing.  The left hip is more painful despite the right hip showing more changes on x-ray.  He has significant past history with severe cardiac problems.  He has had heart surgery and repair of aneurysm.  He has had CVA in past with no residual.  He has been on Coumadin for some time.  He has no problems with bleeding.  His cardiologist said he is a candidate for total hip surgery now.  He does smoke now and that will have to be addressed.  He knows he should not and is willing to quit.  He does not have diabetes.  He does have hypertension.  Review of Systems  HENT: Negative for congestion.   Respiratory: Negative for cough and shortness of breath.   Cardiovascular: Positive for chest pain. Negative for leg  swelling.  Endocrine: Positive for cold intolerance.  Musculoskeletal: Positive for arthralgias and gait problem.  Allergic/Immunologic: Positive for environmental allergies.  Neurological: Negative for tingling and numbness.   Past Medical History  Diagnosis Date  . CAD (coronary artery disease)     Multivessel, LVEF 45-50%  . Cerebrovascular disease     Stroke 2004  . Hyperlipidemia   . Hypertension   . Left ventricular mural thrombus (HCC)     Apical  . Depression     Past Surgical History  Procedure Laterality Date  . Coronary artery bypass graft      DOR anterior ventricular restoration surgery 8/06, [facility] [place] to first diagonal, SVG to PLB, SVG to RVE branch of nondominant RCA  . Eye surgery      Current Outpatient Prescriptions on File Prior to Visit  Medication Sig Dispense Refill  . aspirin EC 81 MG tablet Take 81 mg by mouth daily.    Marland Kitchen lisinopril (PRINIVIL,ZESTRIL) 20 MG tablet TAKE ONE TABLET BY MOUTH ONCE DAILY 90 tablet 3  . metoprolol tartrate (LOPRESSOR) 25 MG tablet Take 1 tablet (25 mg total) by mouth 2 (two) times daily. 180 tablet 1  . nitroGLYCERIN (NITROSTAT) 0.4 MG SL tablet Place 1 tablet (0.4 mg total) under the tongue every 5 (five) minutes as needed. 25 tablet 3  . spironolactone (ALDACTONE) 25 MG tablet Take 0.5 tablets (12.5 mg total) by mouth daily. 45  tablet 3  . warfarin (COUMADIN) 5 MG tablet Take 1 1/2 tablets daily except 1 tablet on Sundays and Thursdays 45 tablet 3  . citalopram (CELEXA) 20 MG tablet Take 20 mg by mouth every evening. Reported on 04/19/2016    . HYDROcodone-acetaminophen (NORCO/VICODIN) 5-325 MG tablet Take 1-2 tablets by mouth every 4 (four) hours as needed. (Patient not taking: Reported on 04/19/2016) 20 tablet 0  . lovastatin (MEVACOR) 20 MG tablet Take 1 tablet (20 mg total) by mouth at bedtime. (Patient not taking: Reported on 04/19/2016) 30 tablet 11  . predniSONE (DELTASONE) 10 MG tablet 5,4,3,2,1 - take with  food (Patient not taking: Reported on 04/19/2016) 15 tablet 0   No current facility-administered medications on file prior to visit.    Social History   Social History  . Marital Status: Married    Spouse Name: N/A  . Number of Children: N/A  . Years of Education: N/A   Occupational History  . Not on file.   Social History Main Topics  . Smoking status: Current Every Day Smoker -- 1.50 packs/day    Types: Cigarettes  . Smokeless tobacco: Never Used  . Alcohol Use: 1.2 oz/week    2 Cans of beer per week     Comment: 1-2 beers a day   . Drug Use: No     Comment: Former marijuana use  . Sexual Activity: Not on file   Other Topics Concern  . Not on file   Social History Narrative    BP 173/91 mmHg  Pulse 58  Temp(Src) 97.5 F (36.4 C)  Ht  (1.702 m)  Wt 163 lb (73.936 kg)  BMI 25.52 kg/m2     Objective:   Physical Exam  Constitutional: He is oriented to person, place, and time. He appears well-developed and well-nourished.  HENT:  Head: Normocephalic and atraumatic.  Eyes: Conjunctivae and EOM are normal. Pupils are equal, round, and reactive to light.  Neck: Normal range of motion. Neck supple.  Cardiovascular: Normal rate, regular rhythm and intact distal pulses.   Pulmonary/Chest: Effort normal.  Abdominal: Soft.  Musculoskeletal: He exhibits tenderness (Pain both hips with decreased internal and external rotation each, adduction and abduction limited, flexion to 90 with pain.  NV intact.).  Neurological: He is alert and oriented to person, place, and time. He has normal reflexes. No cranial nerve deficit. He exhibits normal muscle tone. Coordination normal.  Skin: Skin is warm and dry.  Psychiatric: He has a normal mood and affect. His behavior is normal. Judgment and thought content normal.   He is using crutches.  He has pain to both hips, more on the left. X-rays of the pelvis was done, reported separately.    Assessment & Plan:   Encounter  Diagnoses  Name Primary?  . Avascular necrosis of bone of hip, left (HCC)   . Avascular necrosis of bone of hip, right (HCC)   . Bilateral hip pain Yes  . Essential hypertension, benign   . Atherosclerosis of native coronary artery of native heart without angina pectoris    To see Dr Romeo Apple about a total hip.

## 2016-04-27 ENCOUNTER — Ambulatory Visit (INDEPENDENT_AMBULATORY_CARE_PROVIDER_SITE_OTHER): Payer: Medicaid Other | Admitting: Orthopedic Surgery

## 2016-04-27 ENCOUNTER — Telehealth: Payer: Self-pay | Admitting: *Deleted

## 2016-04-27 VITALS — BP 104/65 | Ht 67.0 in | Wt 163.0 lb

## 2016-04-27 DIAGNOSIS — M87052 Idiopathic aseptic necrosis of left femur: Secondary | ICD-10-CM

## 2016-04-27 DIAGNOSIS — M87051 Idiopathic aseptic necrosis of right femur: Secondary | ICD-10-CM | POA: Diagnosis not present

## 2016-04-27 NOTE — Telephone Encounter (Signed)
REFERRAL FAXED TO [facility] FOR DR BLACKMAN 

## 2016-04-27 NOTE — Patient Instructions (Addendum)
WE WILL REFER TO DR [facility] FOR HIP REPLACEMENTS  Preparing for Hip Replacement Recovery from hip replacement surgery can be made easier and more comfortable by being prepared before surgery. This includes:   Arranging for others to help you.  Preparing your home.  Preparing your body by having a preoperative exam and being as healthy as you can.  Doing exercises before your surgery as directed by your health care provider. You can ease any concerns about your financial responsibilities by calling your insurance company after you decide to have surgery. In addition to asking about your surgery and hospital stay, you will want to ask about coverage for medical equipment, rehabilitation facilities, and home care.  HOW SHOULD I ARRANGE FOR HELP? You will be stronger and more mobile every day. However, in the first couple weeks after surgery, it is unlikely you will be able to do all your daily activities as easily as before your surgery. You may tire easily and will still have limited movement in your leg. Follow these guidelines to best arrange for the help you may need after your surgery:  Plan to have someone take you home after the procedure. Your health care provider will be able to tell you how many days you can expect to be in the hospital.  Cancel all work, caregiving, and volunteer responsibilities for at least 4-6 weeks after surgery.  If you live alone, arrange for someone to care for your home and pets for the first 4-6 weeks after surgery.  Select someone with whom you feel comfortable to be with you day and night for the first week. This person will help you with your exercises and personal care, such as bathing and using the toilet.  Arrange for drivers to bring you to and from your follow-up appointments, the grocery store, and other places you may need to go for at least 4-6 weeks. HOW SHOULD I PREPARE MY HOME?   Pick a recovery spot, but do not plan on recovering in bed.  Sitting in a more upright position is better for your health. You may want to use a recliner with a small table nearby. Choose a chair with a firm seat that will not allow you to sink down into it. Chairs and sofas that are too soft can allow your hip to bend at an angle greater than 90 degrees. This could put you at risk for dislocating your new hip joint. Place the items you use most frequently on the small table. These may include the TV remote, a cordless phone, a book or laptop computer, a water glass, and any other items of your choice.  Remove all clutter from your floors. Also remove any throw rugs.  To see if you will be able to move in your home with a wheeled walker, hold your hands out about 6 inches (15 cm) from your sides. Walk from your recovery spot to your kitchen and bathroom. Then walk from your bed to the bathroom. If you do not hit anything with your hands, you have enough room.  Move the items you use most often in your kitchen, bathroom, and bedroom to shelves and drawers that are at countertop height.  Prepare a few meals to freeze and reheat later.  Consider adding grab bars in the shower and near the toilet.  While you are in the hospital, you will learn about equipment that can be helpful for your recovery. Some of the equipment includes raised toilet seats, tub benches, and  shower benches. HOW SHOULD I PREPARE MY BODY?   Have a preoperative exam. This will ensure that your body is healthy enough to safely have this surgery. Bring a complete list of all your medicines and supplements, including herbs and vitamins. You may need to have additional tests to ensure your safety.  Have elective dental care and routine cleanings before your surgery. Germs from anywhere in your body, including your mouth, can travel to your new joint and infect it. It is important not to have any dental work performed for at least 3 months after your surgery. After surgery, be sure to tell your  dentist about your joint replacement.  Maintain a healthy diet. Do not change your diet before surgery unless advised to do so by your health care provider.  Do not use any tobacco products, including cigarettes, chewing tobacco, or electronic cigarettes. If you need help quitting, ask your health care provider. Tobacco and nicotine products can delay healing after your surgery.  The day before your surgery, follow your health care provider's directions for showering, eating, drinking, and taking medicines. These directions are for your safety. WHAT KINDS OF EXERCISES SHOULD I DO?  Your health care provider may have you do the following exercises before your surgery. Be sure to follow the exercise program only as directed by your health care provider. While completing these exercises, remember to stretch for as long as you can, up to 30 seconds. You should only feel a gentle lengthening or release in the stretched tissue. You should not feel pain.  Ankle Pumps  While sitting on a firm surface with your legs straight out in front of you, move your feet at the ankle joints so that your toes are pulling back toward your chest.  Reverse the motion, pointing your toes away from you.  Repeat 10-20 times. Complete this exercise 1-2 times per day.  Heel Slides 1. Lie on your back with both knees straight. (If this causes back pain, bend one knee, placing your foot flat on the floor. Keep this leg in this position while doing heel slides with the opposite leg.) 2. Slowly slide one heel back toward your buttocks until you feel a gentle stretch in the front of your knee or thigh. 3. Slowly slide your heel back to the starting position. 4. Repeat 10-20 times, then switch heels and do it again. Complete this exercise 1-2 times per day. Quadriceps Sets 1. Lie on your back with one leg extended and your opposite knee bent. 2. Gradually tighten the muscles in the front of the thigh of your extended leg. This  motion will push the back of the knee down toward the floor. 3. Hold the muscle as tightly as you can without causing pain for 10 seconds. 4. Relax the muscles slowly and completely in between each repetition. 5. Repeat 10-20 times, then switch legs and do it again. Complete this exercise 1-2 times per day. Short Arc Kicks  1. Lie on your back. Place a rolled towel (4-6 inches [10-15 cm] high) under one knee so that the knee slightly bends. 2. Raise only the lower leg of your slightly bent leg by tightening the muscles in the front of your thigh. Do not allow your thigh to rise. 3. Hold this position for 5 seconds. 4. Repeat 10-20 times, then switch legs and do it again. Complete this exercise 1-2 times per day. Straight Leg Raises 1. Lie on your back with one leg extended and your opposite knee  bent. 2. Tighten the muscles in the front of the thigh of your extended leg. Your thigh may shake slightly.  3. Tighten these muscles even more and raise your leg 4-6 inches off the floor. Hold for 3-5 seconds. 4. Keeping these muscles tight, lower your leg. 5. Relax the muscles slowly and completely in between each repetition. 6. Repeat 10-20 times, then switch legs and do it again. Complete this exercise 1-2 times per day.  Arm Chair Push-ups 1. Find a firm, non-wheeled chair with solid armrests. 2. Sitting in the chair, extend one leg straight out in front of you. 3. Lift up your body weight, using your arms and opposite leg. 4. Slowly lower your body weight. 5. Repeat 10-20 times, then switch legs and do it again. Complete this exercise 1-2 times per day.   This information is not intended to replace advice given to you by your health care provider. Make sure you discuss any questions you have with your health care provider.   Document Released: 03/23/2011 Document Revised: 01/07/2015 Document Reviewed: 03/11/2014 Elsevier Interactive Patient Education 2016 [employer] Inc. Total Hip  Replacement Total hip replacement is a surgical procedure to remove damaged bone in your hip joint and replace it with an artificial hip joint (prosthetic hip joint). The purpose of this surgery is to reduce pain and to improve your hip function.  During a total hip replacement, one or both parts of the hip joint are replaced, depending on the type of joint damage you have. The hip is a ball-and-socket type of joint, and it has two main parts. The ball part of the joint (femoral head) is the top of the thigh bone (femur). The socket part of the joint is a large indent in the side of your pelvis (acetabulum) where the femur and pelvis meet. LET [facility] CARE PROVIDER KNOW ABOUT:  Any allergies you have.  All medicines you are taking, including vitamins, herbs, eye drops, creams, and over-the-counter medicines.  Previous problems you or members of your family have had with the use of anesthetics.  Any blood disorders you have.  Previous surgeries you have had.  Medical conditions you have. RISKS AND COMPLICATIONS  Generally, total hip replacement is a safe procedure. However, problems can occur, including:  Infection.  Dislocation (the ball of the hip-joint prosthesis comes out of contact with the socket).  Loosening of the piece (stem) that connects the prosthetic femoral head to the femur.  Fracture of the bone while inserting the prosthesis.  Formation of blood clots, which can break loose and travel to and injure your lungs (pulmonary embolus). BEFORE THE PROCEDURE   Plan to have someone take you home after the procedure.  Do not eat or drink anything after midnight on the night before the procedure or as directed by your health care provider.  Ask your health care provider about:  Changing or stopping your regular medicines. This is especially important if you are taking diabetes medicines or blood thinners.  Taking medicines such as aspirin and ibuprofen. These medicines  can thin your blood. Do not take these medicines before your procedure if your health care provider asks you not to.  Ask your health care provider about how your surgical site will be marked or identified.  You may be given antibiotic medicines to help prevent infection. PROCEDURE   To reduce your risk of infection:  Your health care team will wash or sanitize their hands.  Your skin will be washed with soap.  An IV tube will be inserted into one of your veins. You will be given one or more of the following:  A medicine that makes you drowsy (sedative).  A medicine that makes you fall asleep (general anesthetic).  A medicine injected into your spine that numbs your body below the waist (spinal anesthetic).  An incision will be made in your hip. Your surgeon will take out any damaged cartilage and bone.  Your surgeon will then:  Insert a prosthetic socket into the acetabulum of your pelvis. This is usually secured with screws.  Remove the femoral head and replace it with a prosthetic ball and stem secured into the top of your femur.  Place the ball into the socket and check the range of motion and stability of your new hip.  Close the incision and apply a bandage over the surgical site. AFTER THE PROCEDURE   You will stay in a recovery area until the medicines have worn off.  Your vital signs, such as your pulse and blood pressure, will be monitored.  Once you are awake and stable, you will be taken to a hospital room.  You may be directed to take actions to help prevent blood clots. These may include:  Walking soon after surgery, with someone assisting you. Moving around after surgery helps to improve blood flow.  Taking medicines to thin your blood (anticoagulants).  Wearing compression stockings or using different types of devices.  You will receive physical therapy until you are doing well and your health care provider feels it is safe for you to go home.   This  information is not intended to replace advice given to you by your health care provider. Make sure you discuss any questions you have with your health care provider.   Document Released: 03/25/2001 Document Revised: 09/07/2015 Document Reviewed: 02/17/2014 Elsevier Interactive Patient Education [employer]

## 2016-04-27 NOTE — Progress Notes (Signed)
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 Last September he started having pain when he pivoted on his right hip and felt a loud pop and eventually had a workup and found that he had bilateral avascular necrosis which is actually worse on the right on x-ray  His heart surgery was done in [facility]  He has again severe bilateral hip pain the left hip hurts worse than the right he has noticed range of motion loss trouble weightbearing he's on crutches  Prior treatment includes ice heat protected weightbearing with no improvement  He also has history of a stroke  ROS no change from note dictated by Dr. Hilda Lias on April 20  Review of Systems   HENT: Negative for congestion.  Respiratory: Negative for cough and shortness of breath.  Cardiovascular: Positive for chest pain. Negative for leg swelling.  Endocrine: Positive for cold intolerance.  Musculoskeletal: Positive for arthralgias and gait problem.  Allergic/Immunologic: Positive for environmental allergies.  Neurological: Negative for tingling and numbness.   Past Medical History  Diagnosis Date  . CAD (coronary artery disease)     Multivessel, LVEF 45-50%  . Cerebrovascular disease     Stroke 2004  . Hyperlipidemia   . Hypertension   . Left ventricular mural thrombus (HCC)     Apical  . Depression     Past Surgical History  Procedure Laterality Date  . Coronary artery bypass graft      DOR anterior ventricular restoration surgery 8/06, [facility] - [place] to first diagonal, SVG to PLB, SVG to RVE branch of nondominant RCA  . Eye surgery     Family History  Problem Relation Age of Onset  . Hypertension Mother   . Diabetes Mother    Social History   Substance Use Topics  . Smoking status: Current Every Day Smoker -- 1.50 packs/day    Types: Cigarettes  . Smokeless tobacco: Never Used  . Alcohol Use: 1.2 oz/week    2 Cans of beer per week     Comment: 1-2 beers a day     Current outpatient prescriptions:  .  aspirin EC 81 MG tablet, Take 81 mg by mouth daily., Disp: , Rfl:  .  citalopram (CELEXA) 20 MG tablet, Take 20 mg by mouth every evening. Reported on 04/19/2016, Disp: , Rfl:  .  HYDROcodone-acetaminophen (NORCO/VICODIN) 5-325 MG tablet, Take 1-2 tablets by mouth every 4 (four) hours as needed. (Patient not taking: Reported on 04/19/2016), Disp: 20 tablet, Rfl: 0 .  lisinopril (PRINIVIL,ZESTRIL) 20 MG tablet, TAKE ONE TABLET BY MOUTH ONCE DAILY, Disp: 90 tablet, Rfl: 3 .  lovastatin (MEVACOR) 20 MG tablet, Take 1 tablet (20 mg total) by mouth at bedtime. (Patient not taking: Reported on 04/19/2016), Disp: 30 tablet, Rfl: 11 .  metoprolol tartrate (LOPRESSOR) 25 MG tablet, Take 1 tablet (25 mg total) by mouth 2 (two) times daily., Disp: 180 tablet, Rfl: 1 .  nitroGLYCERIN (NITROSTAT) 0.4 MG SL tablet, Place 1 tablet (0.4 mg total) under the tongue every 5 (five) minutes as needed., Disp: 25 tablet, Rfl: 3 .  oxyCODONE-acetaminophen (PERCOCET) 10-325 MG tablet, Take 1 tablet by mouth every 4 (four) hours as needed for pain., Disp: , Rfl:  .  predniSONE (DELTASONE) 10 MG tablet, 5,4,3,2,1 - take with food (Patient not taking: Reported on 04/19/2016), Disp: 15 tablet, Rfl: 0 .  simvastatin (ZOCOR) 20 MG tablet, Take 20 mg by mouth daily., Disp: , Rfl:  .  spironolactone (ALDACTONE) 25 MG tablet, Take 0.5 tablets (12.5 mg total) by mouth daily., Disp: 45 tablet, Rfl: 3 .  varenicline (CHANTIX PAK) 0.5 MG X 11 & 1 MG X 42 tablet, Take by mouth 2 (two) times daily. Take one 0.5 mg tablet by mouth once daily for 3 days, then increase to one 0.5 mg tablet twice daily for 4 days, then increase to one 1 mg tablet twice daily., Disp: , Rfl:  .   warfarin (COUMADIN) 5 MG tablet, Take 1 1/2 tablets daily except 1 tablet on Sundays and Thursdays, Disp: 45 tablet, Rfl: 3  BP 104/65 mmHg  Ht  (1.702 m)  Wt 163 lb (73.936 kg)  BMI 25.52 kg/m2  Physical Exam  Constitutional: He is oriented to person, place, and time. He appears well-developed and well-nourished. No distress.  Cardiovascular: Normal rate and intact distal pulses.   Neurological: He is alert and oriented to person, place, and time.  Skin: Skin is warm and dry. No rash noted. He is not diaphoretic. No erythema. No pallor.  Psychiatric: He has a normal mood and affect. His behavior is normal. Judgment and thought content normal.    Ortho Exam  Bilateral lower extremity exam shows normal range of motion at the knee and ankle but his hip External rotation he has severe pain in flexion only to 90 on the left internal rotation to neutral abduction about 30. This is painful. His hip is stable. His muscle tone is normal. Skin is normal. He has 1+ dorsalis and posterior tibial pulses no edema. Normal sensation in the left leg  Right hip flexors 115 external rotation is 15-20 and internal rotation is +5. Hip remained stable strength normal skin intact 1+ pulses and normal sensation  Balance is adequate   ASSESSMENT: My personal interpretation of the images:  The plain films show subchondral fractures with degenerative changes in both hips  This MRI shows avascular necrosis bilaterally worse than right worse than left   PLAN The patient is high risk despite clearance by cardiology. He had a recent echo in Dr. Diona Browner cleared him for hip surgery  My opinion is that he should be done at a tertiary care facility and he is being referred to Dr. Magnus Ivan for evaluation management and treatment

## 2016-05-09 ENCOUNTER — Ambulatory Visit (INDEPENDENT_AMBULATORY_CARE_PROVIDER_SITE_OTHER): Payer: Medicaid Other | Admitting: *Deleted

## 2016-05-09 DIAGNOSIS — I513 Intracardiac thrombosis, not elsewhere classified: Secondary | ICD-10-CM

## 2016-05-09 DIAGNOSIS — I213 ST elevation (STEMI) myocardial infarction of unspecified site: Secondary | ICD-10-CM | POA: Diagnosis not present

## 2016-05-09 DIAGNOSIS — Z5181 Encounter for therapeutic drug level monitoring: Secondary | ICD-10-CM | POA: Diagnosis not present

## 2016-05-09 LAB — POCT INR: INR: 3.1

## 2016-05-15 ENCOUNTER — Encounter (HOSPITAL_COMMUNITY): Payer: Self-pay | Admitting: Emergency Medicine

## 2016-05-15 ENCOUNTER — Inpatient Hospital Stay (HOSPITAL_COMMUNITY): Payer: Medicaid Other

## 2016-05-15 ENCOUNTER — Emergency Department (HOSPITAL_COMMUNITY): Payer: Medicaid Other

## 2016-05-15 ENCOUNTER — Inpatient Hospital Stay (HOSPITAL_COMMUNITY)
Admission: EM | Admit: 2016-05-15 | Discharge: 2016-05-17 | DRG: 684 | Disposition: A | Discharge status: Home / Self Care | Payer: Medicaid Other | Attending: Family Medicine | Admitting: Family Medicine

## 2016-05-15 DIAGNOSIS — I482 Chronic atrial fibrillation: Secondary | ICD-10-CM | POA: Diagnosis present

## 2016-05-15 DIAGNOSIS — I2510 Atherosclerotic heart disease of native coronary artery without angina pectoris: Secondary | ICD-10-CM | POA: Diagnosis present

## 2016-05-15 DIAGNOSIS — R079 Chest pain, unspecified: Secondary | ICD-10-CM | POA: Diagnosis present

## 2016-05-15 DIAGNOSIS — F329 Major depressive disorder, single episode, unspecified: Secondary | ICD-10-CM | POA: Diagnosis present

## 2016-05-15 DIAGNOSIS — Z8249 Family history of ischemic heart disease and other diseases of the circulatory system: Secondary | ICD-10-CM

## 2016-05-15 DIAGNOSIS — I10 Essential (primary) hypertension: Secondary | ICD-10-CM | POA: Diagnosis present

## 2016-05-15 DIAGNOSIS — R1115 Cyclical vomiting syndrome unrelated to migraine: Secondary | ICD-10-CM

## 2016-05-15 DIAGNOSIS — E785 Hyperlipidemia, unspecified: Secondary | ICD-10-CM | POA: Diagnosis present

## 2016-05-15 DIAGNOSIS — Z951 Presence of aortocoronary bypass graft: Secondary | ICD-10-CM | POA: Diagnosis not present

## 2016-05-15 DIAGNOSIS — I213 ST elevation (STEMI) myocardial infarction of unspecified site: Secondary | ICD-10-CM

## 2016-05-15 DIAGNOSIS — R1084 Generalized abdominal pain: Secondary | ICD-10-CM | POA: Diagnosis not present

## 2016-05-15 DIAGNOSIS — Z8673 Personal history of transient ischemic attack (TIA), and cerebral infarction without residual deficits: Secondary | ICD-10-CM

## 2016-05-15 DIAGNOSIS — F17210 Nicotine dependence, cigarettes, uncomplicated: Secondary | ICD-10-CM | POA: Diagnosis present

## 2016-05-15 DIAGNOSIS — E860 Dehydration: Secondary | ICD-10-CM | POA: Diagnosis present

## 2016-05-15 DIAGNOSIS — J449 Chronic obstructive pulmonary disease, unspecified: Secondary | ICD-10-CM | POA: Diagnosis present

## 2016-05-15 DIAGNOSIS — Z79899 Other long term (current) drug therapy: Secondary | ICD-10-CM

## 2016-05-15 DIAGNOSIS — Z7901 Long term (current) use of anticoagulants: Secondary | ICD-10-CM | POA: Diagnosis not present

## 2016-05-15 DIAGNOSIS — I959 Hypotension, unspecified: Secondary | ICD-10-CM | POA: Diagnosis present

## 2016-05-15 DIAGNOSIS — F5089 Other specified eating disorder: Secondary | ICD-10-CM

## 2016-05-15 DIAGNOSIS — Z7982 Long term (current) use of aspirin: Secondary | ICD-10-CM

## 2016-05-15 DIAGNOSIS — Z833 Family history of diabetes mellitus: Secondary | ICD-10-CM | POA: Diagnosis not present

## 2016-05-15 DIAGNOSIS — R112 Nausea with vomiting, unspecified: Secondary | ICD-10-CM

## 2016-05-15 DIAGNOSIS — I25119 Atherosclerotic heart disease of native coronary artery with unspecified angina pectoris: Secondary | ICD-10-CM | POA: Diagnosis present

## 2016-05-15 DIAGNOSIS — R1110 Vomiting, unspecified: Secondary | ICD-10-CM | POA: Insufficient documentation

## 2016-05-15 DIAGNOSIS — I513 Intracardiac thrombosis, not elsewhere classified: Secondary | ICD-10-CM | POA: Diagnosis present

## 2016-05-15 DIAGNOSIS — R1013 Epigastric pain: Secondary | ICD-10-CM | POA: Diagnosis not present

## 2016-05-15 DIAGNOSIS — R109 Unspecified abdominal pain: Secondary | ICD-10-CM

## 2016-05-15 DIAGNOSIS — F17200 Nicotine dependence, unspecified, uncomplicated: Secondary | ICD-10-CM

## 2016-05-15 DIAGNOSIS — N179 Acute kidney failure, unspecified: Secondary | ICD-10-CM | POA: Diagnosis not present

## 2016-05-15 LAB — PROTIME-INR
INR: 2.11 — ABNORMAL HIGH (ref 0.00–1.49)
Prothrombin Time: 23.5 seconds — ABNORMAL HIGH (ref 11.6–15.2)

## 2016-05-15 LAB — URINALYSIS, ROUTINE W REFLEX MICROSCOPIC
Bilirubin Urine: NEGATIVE
GLUCOSE, UA: NEGATIVE mg/dL
KETONES UR: NEGATIVE mg/dL
LEUKOCYTES UA: NEGATIVE
Nitrite: NEGATIVE
PROTEIN: 30 mg/dL — AB
Specific Gravity, Urine: >1.03 — ABNORMAL HIGH (ref 1.005–1.030)
pH: 5.5 (ref 5.0–8.0)

## 2016-05-15 LAB — LIPASE, BLOOD: Lipase: 23 U/L (ref 11–51)

## 2016-05-15 LAB — TROPONIN I
TROPONIN I: 0.03 ng/mL (ref <0.031)
Troponin I: 0.03 ng/mL (ref <0.031)

## 2016-05-15 LAB — I-STAT CG4 LACTIC ACID, ED: Lactic Acid, Venous: <0.3 mmol/L — ABNORMAL LOW (ref 0.5–2.0)

## 2016-05-15 LAB — HEPATIC FUNCTION PANEL
ALT: 27 U/L (ref 17–63)
AST: 26 U/L (ref 15–41)
Albumin: 4.4 g/dL (ref 3.5–5.0)
Alkaline Phosphatase: 122 U/L (ref 38–126)
BILIRUBIN DIRECT: 0.1 mg/dL (ref 0.1–0.5)
BILIRUBIN INDIRECT: 0.5 mg/dL (ref 0.3–0.9)
Total Bilirubin: 0.6 mg/dL (ref 0.3–1.2)
Total Protein: 8 g/dL (ref 6.5–8.1)

## 2016-05-15 LAB — BASIC METABOLIC PANEL
Anion gap: 12 (ref 5–15)
BUN: 49 mg/dL — ABNORMAL HIGH (ref 6–20)
CALCIUM: 9.9 mg/dL (ref 8.9–10.3)
CO2: 25 mmol/L (ref 22–32)
CREATININE: 4.59 mg/dL — AB (ref 0.61–1.24)
Chloride: 98 mmol/L — ABNORMAL LOW (ref 101–111)
GFR calc non Af Amer: 14 mL/min — ABNORMAL LOW (ref >60)
GFR, EST AFRICAN AMERICAN: 16 mL/min — AB (ref >60)
Glucose, Bld: 108 mg/dL — ABNORMAL HIGH (ref 65–99)
Potassium: 4.6 mmol/L (ref 3.5–5.1)
SODIUM: 135 mmol/L (ref 135–145)

## 2016-05-15 LAB — CBC
HCT: 46.1 % (ref 39.0–52.0)
Hemoglobin: 15.7 g/dL (ref 13.0–17.0)
MCH: 29.8 pg (ref 26.0–34.0)
MCHC: 34.1 g/dL (ref 30.0–36.0)
MCV: 87.5 fL (ref 78.0–100.0)
PLATELETS: 324 10*3/uL (ref 150–400)
RBC: 5.27 MIL/uL (ref 4.22–5.81)
RDW: 14.3 % (ref 11.5–15.5)
WBC: 8.7 10*3/uL (ref 4.0–10.5)

## 2016-05-15 LAB — PROCALCITONIN: Procalcitonin: <0.1 ng/mL

## 2016-05-15 LAB — URINE MICROSCOPIC-ADD ON

## 2016-05-15 MED ORDER — SODIUM CHLORIDE 0.9 % IV SOLN
INTRAVENOUS | Status: AC
  Administered 2016-05-15: 23:00:00 via INTRAVENOUS

## 2016-05-15 MED ORDER — IPRATROPIUM-ALBUTEROL 0.5-2.5 (3) MG/3ML IN SOLN
3.0000 mL | Freq: Four times a day (QID) | RESPIRATORY_TRACT | Status: DC
  Filled 2016-05-15 (×2): qty 3

## 2016-05-15 MED ORDER — WARFARIN - PHARMACIST DOSING INPATIENT
Status: DC
Start: 2016-05-16 — End: 2016-05-17

## 2016-05-15 MED ORDER — ONDANSETRON HCL 4 MG/2ML IJ SOLN: 4.0000 mg | Freq: Four times a day (QID) | INTRAMUSCULAR | Status: DC | PRN

## 2016-05-15 MED ORDER — WARFARIN SODIUM 5 MG PO TABS
7.5000 mg | ORAL_TABLET | Freq: Once | ORAL | Status: AC
  Administered 2016-05-15: 7.5 mg via ORAL
  Filled 2016-05-15: qty 2

## 2016-05-15 MED ORDER — ASPIRIN EC 81 MG PO TBEC
81.0000 mg | DELAYED_RELEASE_TABLET | Freq: Every day | ORAL | Status: DC
  Administered 2016-05-16 – 2016-05-17 (×2): 81 mg via ORAL
  Filled 2016-05-15 (×2): qty 1

## 2016-05-15 MED ORDER — SODIUM CHLORIDE 0.9 % IV SOLN: INTRAVENOUS | Status: AC

## 2016-05-15 MED ORDER — SODIUM CHLORIDE 0.9 % IV BOLUS (SEPSIS)
1000.0000 mL | Freq: Once | INTRAVENOUS | Status: AC
  Administered 2016-05-15: 1000 mL via INTRAVENOUS

## 2016-05-15 MED ORDER — IPRATROPIUM BROMIDE 0.02 % IN SOLN: 0.5000 mg | Freq: Four times a day (QID) | RESPIRATORY_TRACT | Status: DC

## 2016-05-15 MED ORDER — SODIUM CHLORIDE 0.9% FLUSH
3.0000 mL | Freq: Two times a day (BID) | INTRAVENOUS | Status: DC
  Administered 2016-05-17: 3 mL via INTRAVENOUS

## 2016-05-15 MED ORDER — ALBUTEROL SULFATE (2.5 MG/3ML) 0.083% IN NEBU: 2.5000 mg | INHALATION_SOLUTION | RESPIRATORY_TRACT | Status: DC | PRN

## 2016-05-15 MED ORDER — ALBUTEROL SULFATE (2.5 MG/3ML) 0.083% IN NEBU: 2.5000 mg | INHALATION_SOLUTION | Freq: Four times a day (QID) | RESPIRATORY_TRACT | Status: DC

## 2016-05-15 MED ORDER — PANTOPRAZOLE SODIUM 40 MG PO TBEC
40.0000 mg | DELAYED_RELEASE_TABLET | Freq: Every day | ORAL | Status: DC
  Administered 2016-05-15 – 2016-05-17 (×3): 40 mg via ORAL
  Filled 2016-05-15 (×3): qty 1

## 2016-05-15 MED ORDER — ONDANSETRON HCL 4 MG PO TABS: 4.0000 mg | ORAL_TABLET | Freq: Four times a day (QID) | ORAL | Status: DC | PRN

## 2016-05-15 MED ORDER — MORPHINE SULFATE (PF) 2 MG/ML IV SOLN: 1.0000 mg | INTRAVENOUS | Status: DC | PRN

## 2016-05-15 MED ORDER — SIMVASTATIN 20 MG PO TABS
20.0000 mg | ORAL_TABLET | Freq: Every day | ORAL | Status: DC
  Administered 2016-05-15 – 2016-05-16 (×2): 20 mg via ORAL
  Filled 2016-05-15 (×2): qty 1

## 2016-05-15 MED ORDER — SODIUM CHLORIDE 0.9 % IV SOLN: INTRAVENOUS | Status: DC

## 2016-05-15 NOTE — ED Provider Notes (Signed)
CSN: [id number]     Arrival date & time 05/15/16  25 History   First MD Initiated Contact with Patient 05/15/16 1420     Chief Complaint  Patient presents with  . Chest Pain     (Consider location/radiation/quality/duration/timing/severity/associated sxs/prior Treatment) HPI   Steve Vang is a 50 y.o. male who presents for evaluation of low blood pressure and an episode of chest pain. Chest pain occurred around 8:30 this morning, and resolved after taking a single nitroglycerin tablet. His blood pressure was taken, several times and was as low as 68, systolic, and his high as 95 systolic. He came here by private vehicle. He also states he has persistent nausea, vomiting, with upper abdominal pain, for 1 week. He denies persistent chest pain, shortness of breath, fever, chills, or back pain. He states that he is taking his usual medications. There are no other known modifying factors.  Past Medical History  Diagnosis Date  . CAD (coronary artery disease)     Multivessel, LVEF 45-50%  . Cerebrovascular disease     Stroke 2004  . Hyperlipidemia   . Hypertension   . Left ventricular mural thrombus (HCC)     Apical  . Depression    Past Surgical History  Procedure Laterality Date  . Coronary artery bypass graft      DOR anterior ventricular restoration surgery 8/06, [facility] [place] to first diagonal, SVG to PLB, SVG to RVE branch of nondominant RCA  . Eye surgery     Family History  Problem Relation Age of Onset  . Hypertension Mother   . Diabetes Mother    Social History  Substance Use Topics  . Smoking status: Current Every Day Smoker -- 0.50 packs/day    Types: Cigarettes  . Smokeless tobacco: Never Used  . Alcohol Use: Yes     Comment: occ    Review of Systems  All other systems reviewed and are negative.     Allergies  Benadryl  Home Medications   Prior to Admission medications   Medication Sig Start Date End Date Taking? Authorizing Provider   aspirin EC 81 MG tablet Take 81 mg by mouth daily.   Yes Historical Provider, MD  lisinopril (PRINIVIL,ZESTRIL) 20 MG tablet TAKE ONE TABLET BY MOUTH ONCE DAILY 08/29/15  Yes Lendon Colonel, NP  metoprolol tartrate (LOPRESSOR) 25 MG tablet Take 1 tablet (25 mg total) by mouth 2 (two) times daily. 03/09/16  Yes Satira Sark, MD  nitroGLYCERIN (NITROSTAT) 0.4 MG SL tablet Place 1 tablet (0.4 mg total) under the tongue every 5 (five) minutes as needed. 09/03/14  Yes Satira Sark, MD  oxyCODONE-acetaminophen (PERCOCET) 10-325 MG tablet Take 1 tablet by mouth every 4 (four) hours as needed for pain.   Yes Historical Provider, MD  simvastatin (ZOCOR) 20 MG tablet Take 20 mg by mouth daily.   Yes Historical Provider, MD  spironolactone (ALDACTONE) 25 MG tablet Take 0.5 tablets (12.5 mg total) by mouth daily. 12/06/14  Yes Lendon Colonel, NP  warfarin (COUMADIN) 5 MG tablet Take 1 1/2 tablets daily except 1 tablet on Sundays and Thursdays 03/21/16  Yes Satira Sark, MD  citalopram (CELEXA) 20 MG tablet Take 20 mg by mouth every evening. Reported on 05/15/2016    Historical Provider, MD  predniSONE (DELTASONE) 10 MG tablet 5,4,3,2,1 - take with food Patient not taking: Reported on 04/19/2016 12/05/15   Lily Kocher, PA-C  varenicline (CHANTIX PAK) 0.5 MG X 11 & 1  MG X 42 tablet Take by mouth 2 (two) times daily. Reported on 05/15/2016    Historical Provider, MD   BP 95/43 mmHg  Pulse 53  Temp(Src) 97.5 F (36.4 C) (Oral)  Resp 11  Ht _0  (1.702 m)  Wt 162 lb (73.483 kg)  BMI 25.37 kg/m2  SpO2 98% Physical Exam  Constitutional: He is oriented to person, place, and time. He appears well-developed and well-nourished.  HENT:  Head: Normocephalic and atraumatic.  Right Ear: External ear normal.  Left Ear: External ear normal.  Eyes: Conjunctivae and EOM are normal. Pupils are equal, round, and reactive to light.  Neck: Normal range of motion and phonation normal. Neck supple.   Cardiovascular: Normal rate, regular rhythm and normal heart sounds.   Pulmonary/Chest: Effort normal and breath sounds normal. He exhibits no bony tenderness.  Abdominal: Soft. There is no tenderness.  Musculoskeletal: Normal range of motion.  Neurological: He is alert and oriented to person, place, and time. No cranial nerve deficit or sensory deficit. He exhibits normal muscle tone. Coordination normal.  Skin: Skin is warm, dry and intact.  Psychiatric: He has a normal mood and affect. His behavior is normal. Judgment and thought content normal.  Nursing note and vitals reviewed.   ED Course  Procedures (including critical care time)  Medications  sodium chloride 0.9 % bolus 1,000 mL (0 mLs Intravenous Stopped 05/15/16 1639)    Patient Vitals for the past 24 hrs:  BP Temp Temp src Pulse Resp SpO2 Height Weight  05/15/16 1608 (!) 95/43 mmHg - - (!) 53 11 98 % - -  05/15/16 1521 - - - (!) 50 14 - - -  05/15/16 1515 103/60 mmHg - - - - 93 % - -  05/15/16 1445 113/86 mmHg - - (!) 51 24 94 % - -  05/15/16 1430 92/55 mmHg - - (!) 54 23 98 % - -  05/15/16 1427 103/59 mmHg - - (!) 51 12 96 % - -  05/15/16 1338 103/65 mmHg 97.5 F (36.4 C) Oral (!) 55 20 100 % _1  (1.702 m) 162 lb (73.483 kg)    7:18 PM Reevaluation with update and discussion. After initial assessment and treatment, an updated evaluation reveals he feels better now with increased blood pressure, was able to ambulate, using crutches to the bathroom. We discussed with patient, all questions were answered. [name]   7:19 PM-Consult complete with Hospitalist. Patient case explained and discussed. She agrees to admit patient for further evaluation and treatment. Call ended at 19:25  Labs Review Labs Reviewed  BASIC METABOLIC PANEL - Abnormal; Notable for the following:    Chloride 98 (*)    Glucose, Bld 108 (*)    BUN 49 (*)    Creatinine, Ser 4.59 (*)    GFR calc non Af Amer 14 (*)    GFR calc Af Amer 16 (*)     All other components within normal limits  URINALYSIS, ROUTINE W REFLEX MICROSCOPIC (NOT AT [facility]) - Abnormal; Notable for the following:    Color, Urine AMBER (*)    Specific Gravity, Urine >1.030 (*)    Hgb urine dipstick TRACE (*)    Protein, ur 30 (*)    All other components within normal limits  URINE MICROSCOPIC-ADD ON - Abnormal; Notable for the following:    Squamous Epithelial / LPF 0-5 (*)    Bacteria, UA MANY (*)    Casts HYALINE CASTS (*)    All other  components within normal limits  CBC  TROPONIN I  HEPATIC FUNCTION PANEL  LIPASE, BLOOD   Component     Latest Ref Rng 07/07/2013 11/17/2014 05/15/2016  Sodium     135 - 145 mmol/L 137 138 135  Potassium     3.5 - 5.1 mmol/L 4.7 4.9 4.6  Chloride     101 - 111 mmol/L 100 104 98 (L)  CO2     22 - 32 mmol/L _0 Glucose     65 - 99 mg/dL 115 (H) 85 108 (H)  BUN     6 - 20 mg/dL 12 17 49 (H)  Creatinine     0.61 - 1.24 mg/dL 1.19 1.21 4.59 (H)  Calcium     8.9 - 10.3 mg/dL 10.1 9.1 9.9  EGFR (Non-African Amer.)     >60 mL/min   14 (L)  EGFR (African American)     >60 mL/min   16 (L)  Anion gap     5 - 15   12    Imaging Review Dg Chest 2 View  05/15/2016  CLINICAL DATA:  Left anterior chest pain since this morning EXAM: CHEST  2 VIEW COMPARISON:  September 03, 2010 FINDINGS: The heart size and mediastinal contours are stable. Patient status post prior CABG and median sternotomy. Both lungs are clear. The visualized skeletal structures are unremarkable. IMPRESSION: No active cardiopulmonary disease. Electronically Signed   By: Abelardo Diesel M.D.   On: 05/15/2016 14:09   I have personally reviewed and evaluated these images and lab results as part of my medical decision-making.   EKG Interpretation   Date/Time:  Tuesday May 15 2016 13:38:20 EDT Ventricular Rate:  56 PR Interval:  160 QRS Duration: 97 QT Interval:  445 QTC Calculation: 429 R Axis:   76 Text Interpretation:  Sinus rhythm Probable  lateral infarct, age  indeterminate Anteroseptal infarct, age indeterminate Since last tracing  nonspecific anterior ST elevation Confirmed by [facility]  MD, Lora Glomski [phone])  on 05/15/2016 2:23:35 PM         MDM   Final diagnoses:  AKI (acute kidney injury) ([place])  Generalized abdominal pain  Nausea and vomiting, vomiting of unspecified type    Abdominal pain, nonspecific, improved after treatment in the ED. Significant dehydration, with acute kidney injury. Possible UTI. Doubt sepsis. No fever. Blood pressure, low improved with IV fluids. He is on multiple medications, to lower blood pressure, including vasoactive medications, and diuretic medication. Doubt ACS, or impending vascular collapse. He will require admission for hydration, and reassessment.   Nursing Notes Reviewed/ Care Coordinated, and agree without changes. Applicable Imaging Reviewed.  Interpretation of Laboratory Data incorporated into ED treatment   Plan: Admit  Daleen Bo, MD 05/15/16 [phone]

## 2016-05-15 NOTE — Progress Notes (Signed)
ANTICOAGULATION CONSULT NOTE - Initial Consult  Pharmacy Consult for Warfarin (home med) Indication: Mural Thrombus  Allergies  Allergen Reactions  . Benadryl [Diphenhydramine] Hives and Swelling   Patient Measurements: Height: 5\' 7"  (170.2 cm) Weight: 152 lb 12.8 oz (69.31 kg) IBW/kg (Calculated) : 66.1  Vital Signs: Temp: 97.9 F (36.6 C) (05/16 2030) Temp Source: Oral (05/16 2030) BP: 129/76 mmHg (05/16 2030) Pulse Rate: 56 (05/16 2030)  Labs:  Recent Labs  05/15/16 1336  HGB 15.7  HCT 46.1  PLT 324  CREATININE 4.59*  TROPONINI 0.03    Estimated Creatinine Clearance: 18 mL/min (by C-G formula based on Cr of 4.59).   Medical History: Past Medical History  Diagnosis Date  . CAD (coronary artery disease)     Multivessel, LVEF 45-50%  . Cerebrovascular disease     Stroke 2004  . Hyperlipidemia   . Hypertension   . Left ventricular mural thrombus (HCC)     Apical  . Depression     Medications:  Prescriptions prior to admission  Medication Sig Dispense Refill Last Dose  . aspirin EC 81 MG tablet Take 81 mg by mouth daily.   05/15/2016 at Unknown time  . lisinopril (PRINIVIL,ZESTRIL) 20 MG tablet TAKE ONE TABLET BY MOUTH ONCE DAILY 90 tablet 3 05/14/2016 at 2000  . metoprolol tartrate (LOPRESSOR) 25 MG tablet Take 1 tablet (25 mg total) by mouth 2 (two) times daily. 180 tablet 1 05/15/2016 at 700  . nitroGLYCERIN (NITROSTAT) 0.4 MG SL tablet Place 1 tablet (0.4 mg total) under the tongue every 5 (five) minutes as needed. 25 tablet 3 05/15/2016 at 800  . oxyCODONE-acetaminophen (PERCOCET) 10-325 MG tablet Take 1 tablet by mouth every 4 (four) hours as needed for pain.   05/15/2016 at Unknown time  . simvastatin (ZOCOR) 20 MG tablet Take 20 mg by mouth daily.   05/14/2016 at Unknown time  . spironolactone (ALDACTONE) 25 MG tablet Take 0.5 tablets (12.5 mg total) by mouth daily. 45 tablet 3 05/14/2016 at Unknown time  . warfarin (COUMADIN) 5 MG tablet Take 1 1/2 tablets  daily except 1 tablet on Sundays and Thursdays 45 tablet 3 05/14/2016 at 2000  . citalopram (CELEXA) 20 MG tablet Take 20 mg by mouth every evening. Reported on 05/15/2016   Not Taking at Unknown time  . predniSONE (DELTASONE) 10 MG tablet 5,4,3,2,1 - take with food (Patient not taking: Reported on 04/19/2016) 15 tablet 0 Completed Course at Unknown time  . varenicline (CHANTIX PAK) 0.5 MG X 11 & 1 MG X 42 tablet Take by mouth 2 (two) times daily. Reported on 05/15/2016   Not Taking at Unknown time    Assessment: Okay for Protocol, INR at goal, no bleeding noted, no dose yet today per home med list.  Goal of Therapy:  INR 2-3   Plan:  Warfarin 7.5mg  PO x 1 today. Daily PT/INR. Monitor for signs and symptoms of bleeding.   Mady Gemma 05/15/2016,9:14 PM

## 2016-05-15 NOTE — ED Notes (Signed)
Pt ambulated to bathroom on crutches with no difficulty, MD notified.

## 2016-05-15 NOTE — ED Notes (Signed)
Pt requesting another sprite, pt given another sprite.

## 2016-05-15 NOTE — ED Notes (Signed)
Onset this am pt had episode of chest pain, took one nitro and was able to get relief, concerned that BP dropped. Pt has hx of heart problems and needs bilateral hip surgery

## 2016-05-15 NOTE — H&P (Signed)
PCP:   Isabella Stalling, MD   Chief Complaint:  Nausea and vomiting/abdominal pain and low blood pressure  HPI: 50 yo male h/o CAD, EF 45%, CVA in 2004, mural thrombus on coumadin comes in with low blood pressure today at home sbp in the 60s.  Pt reports he has been having epigastric abdominal pain with associated nausea and vomiting nonbloody for about a week now.  His pain is in the epigastrum and does not radiate to the chest or ruq.  He still has his gallbladder., no h/o PUD.  No etoh use.  The pain is not associated with eating and there are no alleviating factors or factors that worsen it.  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  When he started the chantix it was causing a lot of nausea, so he quit taking it 2 weeks ago.  His symptoms progressed to vomiting in the last week.  He also thinks it may be his pain meds, he was recently put on this four times a day, but has not noticed if that is really the cause of his vomiting.  He decreased his oxycodone use to only twice a day and his vomiting has persisted also.  His vomiting is worse after eating foods and not with liquids.  No dysphagia.  He denies any fevers.  No diarrhea.  Is still taking all of his bp meds.  Denies sob, cough, chest pain.  Has been urinating, and no urinary symptoms such as dysuria, hematuria or change in frequency.  Pt referred for admission for acute kidney injury, hypotension and chest pain.  Pt bp was soft on arrival, and has normalized after 2 liters ivf in the ED.  Review of Systems:  Positive and negative as per HPI otherwise all other systems are negative  Past Medical History: Past Medical History  Diagnosis Date  . CAD (coronary artery disease)     Multivessel, LVEF 45-50%  . Cerebrovascular disease     Stroke 2004  . Hyperlipidemia   . Hypertension   . Left ventricular mural thrombus (HCC)     Apical   . Depression    Past Surgical History  Procedure Laterality Date  . Coronary artery bypass graft      DOR anterior ventricular restoration surgery 8/06, [facility] [place] to first diagonal, SVG to PLB, SVG to RVE branch of nondominant RCA  . Eye surgery      Medications: Prior to Admission medications   Medication Sig Start Date End Date Taking? Authorizing Provider  aspirin EC 81 MG tablet Take 81 mg by mouth daily.   Yes Historical Provider, MD  lisinopril (PRINIVIL,ZESTRIL) 20 MG tablet TAKE ONE TABLET BY MOUTH ONCE DAILY 08/29/15  Yes Jodelle Gross, NP  metoprolol tartrate (LOPRESSOR) 25 MG tablet Take 1 tablet (25 mg total) by mouth 2 (two) times daily. 03/09/16  Yes Jonelle Sidle, MD  nitroGLYCERIN (NITROSTAT) 0.4 MG SL tablet Place 1 tablet (0.4 mg total) under the tongue every 5 (five) minutes as needed. 09/03/14  Yes Jonelle Sidle, MD  oxyCODONE-acetaminophen (PERCOCET) 10-325 MG tablet Take 1 tablet by mouth every 4 (four) hours as needed for pain.   Yes Historical Provider, MD  simvastatin (ZOCOR) 20 MG tablet Take 20 mg by mouth daily.   Yes Historical Provider, MD  spironolactone (ALDACTONE) 25 MG tablet Take 0.5 tablets (12.5 mg total)  by mouth daily. 12/06/14  Yes Jodelle Gross, NP  warfarin (COUMADIN) 5 MG tablet Take 1 1/2 tablets daily except 1 tablet on Sundays and Thursdays 03/21/16  Yes Jonelle Sidle, MD  citalopram (CELEXA) 20 MG tablet Take 20 mg by mouth every evening. Reported on 05/15/2016    Historical Provider, MD  predniSONE (DELTASONE) 10 MG tablet 5,4,3,2,1 - take with food Patient not taking: Reported on 04/19/2016 12/05/15   Ivery Quale, PA-C  varenicline (CHANTIX PAK) 0.5 MG X 11 & 1 MG X 42 tablet Take by mouth 2 (two) times daily. Reported on 05/15/2016    Historical Provider, MD    Allergies:   Allergies  Allergen Reactions  . Benadryl [Diphenhydramine] Hives and Swelling    Social History:  reports that he has been smoking  Cigarettes.  He has been smoking about 0.50 packs per day. He has never used smokeless tobacco. He reports that he drinks alcohol. He reports that he does not use illicit drugs.  Family History: Family History  Problem Relation Age of Onset  . Hypertension Mother   . Diabetes Mother     Physical Exam: Filed Vitals:   05/15/16 1730 05/15/16 1745 05/15/16 1856 05/15/16 2030  BP: 102/74 113/80 125/74 129/76  Pulse:  47 43 56  Temp:    97.9 F (36.6 C)  TempSrc:    Oral  Resp: 19 18 16 18   Height:    5\' 7"  (1.702 m)  Weight:    69.31 kg (152 lb 12.8 oz)  SpO2:  98% 94% 95%   General appearance: alert, cooperative and no distress Head: Normocephalic, without obvious abnormality, atraumatic Eyes: negative Nose: Nares normal. Septum midline. Mucosa normal. No drainage or sinus tenderness. Neck: no JVD and supple, symmetrical, trachea midline Lungs: clear to auscultation bilaterally Heart: regular rate and rhythm, S1, S2 normal, no murmur, click, rub or gallop Abdomen: soft, non-tender; bowel sounds normal; no masses,  no organomegaly Extremities: extremities normal, atraumatic, no cyanosis or edema Pulses: 2+ and symmetric Skin: Skin color, texture, turgor normal. No rashes or lesions Neurologic: Grossly normal    Labs on Admission:   Recent Labs  05/15/16 1336  NA 135  K 4.6  CL 98*  CO2 25  GLUCOSE 108*  BUN 49*  CREATININE 4.59*  CALCIUM 9.9    Recent Labs  05/15/16 1406  AST 26  ALT 27  ALKPHOS 122  BILITOT 0.6  PROT 8.0  ALBUMIN 4.4    Recent Labs  05/15/16 1406  LIPASE 23    Recent Labs  05/15/16 1336  WBC 8.7  HGB 15.7  HCT 46.1  MCV 87.5  PLT 324    Recent Labs  05/15/16 1336  TROPONINI 0.03    Radiological Exams on Admission: Dg Chest 2 View  05/15/2016  CLINICAL DATA:  Left anterior chest pain since this morning EXAM: CHEST  2 VIEW COMPARISON:  September 03, 2010 FINDINGS: The heart size and mediastinal contours are stable.  Patient status post prior CABG and median sternotomy. Both lungs are clear. The visualized skeletal structures are unremarkable. IMPRESSION: No active cardiopulmonary disease. Electronically Signed   By: Sherian Rein M.D.   On: 05/15/2016 14:09   Ct Renal Stone Study  05/15/2016  CLINICAL DATA:  Upper abdominal pain for 1 week. EXAM: CT ABDOMEN AND PELVIS WITHOUT CONTRAST TECHNIQUE: Multidetector CT imaging of the abdomen and pelvis was performed following the standard protocol without IV contrast. COMPARISON:  None. FINDINGS: Lower chest:  The lung bases appear clear.  No pleural effusion. Hepatobiliary: There is hepatic steatosis noted. The gallbladder appears within normal limits. Pancreas: No mass or inflammatory process identified on this un-enhanced exam. Spleen: Within normal limits in size. Adrenals/Urinary Tract: Normal adrenal glands. The kidneys are unremarkable. No kidney stones or nephrolithiasis. The urinary bladder is normal. Stomach/Bowel: The stomach is normal. The small bowel loops have a normal course and caliber. The appendix is visualized and is upper limits of normal in diameter measuring 7 mm. No periappendiceal fat stranding or free fluid identified. No pathologic dilatation of the colon. Vascular/Lymphatic: No pathologically enlarged lymph nodes. No evidence of abdominal aortic aneurysm. Reproductive: No mass or other significant abnormality. Other: None. Musculoskeletal: Evidence of bilateral hip avascular necrosis with sub chondral fracture is again identified. IMPRESSION: 1. No acute findings identified within the abdomen or pelvis. 2. Aortic atherosclerosis noted. 3. The appendix is visualized and is upper limits of normal in caliber. No periappendiceal fat stranding or free fluid. Correlate for any focal tenderness in this area. 4. Bilateral hip avascular necrosis. Electronically Signed   By: Signa Kell M.D.   On: 05/15/2016 20:24   Old records reviewed ekg reviewed nsr tw  inverstions noted, c/w to old ekgs no change cxr reviewed no edema or infiltrate Case discussed with dr Effie Shy  Assessment/Plan  50 yo male with h/o CAD/CHF/mural thrombus with one week of vomiting and epigastric abdominal pain now with AKI, hypotension, dehydration  Principal Problem:   AKI (acute kidney injury) (HCC)- per history think this is mainly due to prerenal azotemia +ATN in the setting of GI volume losses with continued use of multiple cardiac medications bblocker, ace inhibitor and diuretics.  ua not significant for infection and pt absent of symptoms for uti.  Cr was normal recently and now over 4.  Will hold all bp medications.  Check orthostatics now after 2 liters.  Continue ns at 100cc/hour for the next 12 hours since bp has normalized (EF 40%) as to not fluid overload.  Identify the cause of his vomiting and abdominal pain.  LA normal, no indication for abx at this time.  Check procalcitonin levels.  Active Problems:   Essential hypertension, benign- noted, holding all cardiac meds   CORONARY ATHEROSCLEROSIS NATIVE CORONARY ARTERY- serial troponin, but dont think the etiology of his pain is cardiac in nature   Mural thrombus of left ventricle (HCC)- cont coumadin, pharmacy consulted to manage   Long term (current) use of anticoagulants- as above   Nausea and vomiting- gallbladder disease possibility.  lfts and lipase are normal.  Will order ultrasound, may need HIDA scan if symptoms persist and ultrasound normal.  Place on protonix but no h/o GERD/PUD.  Abdominal exam is benign.   Abdominal pain- as above, r/o gallbladder disease   Chest pain- pain is actually in epigastrum.  ekg nonischemic with old changes noted, trop neg.  Serial trop overnight just for precaution.  But issues appear GI related.  Admit to telemetry floor.  PCP is Dr. Janna Arch.  Full code.  DAVID,RACHAL A 05/15/2016, 8:43 PM

## 2016-05-15 NOTE — ED Notes (Signed)
Pt given sprite to drink. 

## 2016-05-15 NOTE — ED Notes (Signed)
Dr. Effie Shy notified of low BP ((81/56) and low hr (48)

## 2016-05-16 ENCOUNTER — Inpatient Hospital Stay (HOSPITAL_COMMUNITY): Payer: Medicaid Other

## 2016-05-16 LAB — COMPREHENSIVE METABOLIC PANEL
ALBUMIN: 3.3 g/dL — AB (ref 3.5–5.0)
ALT: 21 U/L (ref 17–63)
AST: 21 U/L (ref 15–41)
Alkaline Phosphatase: 90 U/L (ref 38–126)
Anion gap: 6 (ref 5–15)
BUN: 45 mg/dL — AB (ref 6–20)
CHLORIDE: 107 mmol/L (ref 101–111)
CO2: 24 mmol/L (ref 22–32)
CREATININE: 2.93 mg/dL — AB (ref 0.61–1.24)
Calcium: 8.5 mg/dL — ABNORMAL LOW (ref 8.9–10.3)
GFR calc Af Amer: 27 mL/min — ABNORMAL LOW (ref >60)
GFR, EST NON AFRICAN AMERICAN: 23 mL/min — AB (ref >60)
GLUCOSE: 109 mg/dL — AB (ref 65–99)
POTASSIUM: 4.4 mmol/L (ref 3.5–5.1)
SODIUM: 137 mmol/L (ref 135–145)
Total Bilirubin: 0.3 mg/dL (ref 0.3–1.2)
Total Protein: 6.6 g/dL (ref 6.5–8.1)

## 2016-05-16 LAB — PROTIME-INR
INR: 2.27 — ABNORMAL HIGH (ref 0.00–1.49)
Prothrombin Time: 24.9 seconds — ABNORMAL HIGH (ref 11.6–15.2)

## 2016-05-16 LAB — TROPONIN I
TROPONIN I: 0.04 ng/mL — AB (ref <0.031)
TROPONIN I: 0.03 ng/mL (ref <0.031)

## 2016-05-16 LAB — CBC
HEMATOCRIT: 39.6 % (ref 39.0–52.0)
Hemoglobin: 13.4 g/dL (ref 13.0–17.0)
MCH: 29.7 pg (ref 26.0–34.0)
MCHC: 33.8 g/dL (ref 30.0–36.0)
MCV: 87.8 fL (ref 78.0–100.0)
PLATELETS: 241 10*3/uL (ref 150–400)
RBC: 4.51 MIL/uL (ref 4.22–5.81)
RDW: 14.3 % (ref 11.5–15.5)
WBC: 4.6 10*3/uL (ref 4.0–10.5)

## 2016-05-16 MED ORDER — WARFARIN SODIUM 5 MG PO TABS
7.5000 mg | ORAL_TABLET | Freq: Once | ORAL | Status: AC
  Administered 2016-05-16: 7.5 mg via ORAL
  Filled 2016-05-16: qty 2

## 2016-05-16 MED ORDER — METOPROLOL TARTRATE 25 MG PO TABS
25.0000 mg | ORAL_TABLET | Freq: Two times a day (BID) | ORAL | Status: DC
  Administered 2016-05-16 – 2016-05-17 (×3): 25 mg via ORAL
  Filled 2016-05-16 (×3): qty 1

## 2016-05-16 MED ORDER — OXYCODONE HCL 5 MG PO TABS: 5.0000 mg | ORAL_TABLET | ORAL | Status: DC | PRN

## 2016-05-16 MED ORDER — SODIUM CHLORIDE 0.9 % IV SOLN
INTRAVENOUS | Status: AC
  Administered 2016-05-16: 12:00:00 via INTRAVENOUS

## 2016-05-16 NOTE — Discharge Instructions (Signed)
.  rxavs 

## 2016-05-16 NOTE — Progress Notes (Signed)
Patient has less nausea and vomiting today will function improving with IV fluid resuscitation awaiting abdominal sonogram today lipase within normal limits we'll obtain be met daily for 3 days NHAT HEARNE PFY:[id number] DOB: Mar 11, 1966 DOA: 05/15/2016 PCP: Maricela Curet, MD   Physical Exam: Blood pressure 139/66, pulse 52, temperature 97.7 F (36.5 C), temperature source Oral, resp. rate 16, height _0  (1.702 m), weight 65.59 kg (144 lb 9.6 oz), SpO2 96 %. Lungs show prolonged x-ray phase scattered rhonchi no rales no wheezes heart regular rhythm no S3-S4 no heaves thrills rubs abdomen soft nontender bowel sounds normoactive   Investigations:  No results found for this or any previous visit (from the past 240 hour(s)).   Basic Metabolic Panel:  Recent Labs  05/15/16 1336 05/16/16 0245  NA 135 137  K 4.6 4.4  CL 98* 107  CO2 25 24  GLUCOSE 108* 109*  BUN 49* 45*  CREATININE 4.59* 2.93*  CALCIUM 9.9 8.5*   Liver Function Tests:  Recent Labs  05/15/16 1406 05/16/16 0245  AST 26 21  ALT 27 21  ALKPHOS 122 90  BILITOT 0.6 0.3  PROT 8.0 6.6  ALBUMIN 4.4 3.3*     CBC:  Recent Labs  05/15/16 1336 05/16/16 0500  WBC 8.7 4.6  HGB 15.7 13.4  HCT 46.1 39.6  MCV 87.5 87.8  PLT 324 241    Dg Chest 2 View  05/15/2016  CLINICAL DATA:  Left anterior chest pain since this morning EXAM: CHEST  2 VIEW COMPARISON:  September 03, 2010 FINDINGS: The heart size and mediastinal contours are stable. Patient status post prior CABG and median sternotomy. Both lungs are clear. The visualized skeletal structures are unremarkable. IMPRESSION: No active cardiopulmonary disease. Electronically Signed   By: Abelardo Diesel M.D.   On: 05/15/2016 14:09   Ct Renal Stone Study  05/15/2016  CLINICAL DATA:  Upper abdominal pain for 1 week. EXAM: CT ABDOMEN AND PELVIS WITHOUT CONTRAST TECHNIQUE: Multidetector CT imaging of the abdomen and pelvis was performed following the standard protocol  without IV contrast. COMPARISON:  None. FINDINGS: Lower chest:  The lung bases appear clear.  No pleural effusion. Hepatobiliary: There is hepatic steatosis noted. The gallbladder appears within normal limits. Pancreas: No mass or inflammatory process identified on this un-enhanced exam. Spleen: Within normal limits in size. Adrenals/Urinary Tract: Normal adrenal glands. The kidneys are unremarkable. No kidney stones or nephrolithiasis. The urinary bladder is normal. Stomach/Bowel: The stomach is normal. The small bowel loops have a normal course and caliber. The appendix is visualized and is upper limits of normal in diameter measuring 7 mm. No periappendiceal fat stranding or free fluid identified. No pathologic dilatation of the colon. Vascular/Lymphatic: No pathologically enlarged lymph nodes. No evidence of abdominal aortic aneurysm. Reproductive: No mass or other significant abnormality. Other: None. Musculoskeletal: Evidence of bilateral hip avascular necrosis with sub chondral fracture is again identified. IMPRESSION: 1. No acute findings identified within the abdomen or pelvis. 2. Aortic atherosclerosis noted. 3. The appendix is visualized and is upper limits of normal in caliber. No periappendiceal fat stranding or free fluid. Correlate for any focal tenderness in this area. 4. Bilateral hip avascular necrosis. Electronically Signed   By: Kerby Moors M.D.   On: 05/15/2016 20:24      Medication  Impression:  Principal Problem:   AKI (acute kidney injury) ([place]) Active Problems:   Essential hypertension, benign   CORONARY ATHEROSCLEROSIS NATIVE CORONARY ARTERY   Mural thrombus of left ventricle (  [place])   Long term (current) use of anticoagulants   Nausea and vomiting   Abdominal pain   Chest pain   Emesis     Plan: Reinstitute Lopressor 25 by mouth twice a day Zocor 20 mg by mouth daily hold diuretics. Continue IV fluid resuscitation monitor be met daily  3  Consultants:    Procedures   Antibiotics:            Time spent: 30 minutes   LOS: 1 day   Tamerra Merkley M   05/16/2016, 11:52 AM

## 2016-05-16 NOTE — Progress Notes (Signed)
ANTICOAGULATION CONSULT NOTE  Pharmacy Consult for Warfarin (home med) Indication: Mural Thrombus  Allergies  Allergen Reactions  . Benadryl [Diphenhydramine] Hives and Swelling   Patient Measurements: Height: 5\' 7"  (170.2 cm) Weight: 144 lb 9.6 oz (65.59 kg) IBW/kg (Calculated) : 66.1  Vital Signs: Temp: 97.7 F (36.5 C) (05/17 0500) Temp Source: Oral (05/17 0500) BP: 139/66 mmHg (05/17 0500) Pulse Rate: 52 (05/17 0500)  Labs:  Recent Labs  05/15/16 1336 05/15/16 2101 05/16/16 0245 05/16/16 0500  HGB 15.7  --   --  13.4  HCT 46.1  --   --  39.6  PLT 324  --   --  241  LABPROT 23.5*  --   --  24.9*  INR 2.11*  --   --  2.27*  CREATININE 4.59*  --  2.93*  --   TROPONINI 0.03 0.03 0.04*  --     Estimated Creatinine Clearance: 28 mL/min (by C-G formula based on Cr of 2.93).   Medical History: Past Medical History  Diagnosis Date  . CAD (coronary artery disease)     Multivessel, LVEF 45-50%  . Cerebrovascular disease     Stroke 2004  . Hyperlipidemia   . Hypertension   . Left ventricular mural thrombus ([facility])     Apical  . Depression     Medications:  Prescriptions prior to admission  Medication Sig Dispense Refill Last Dose  . aspirin EC 81 MG tablet Take 81 mg by mouth daily.   05/15/2016 at Unknown time  . lisinopril (PRINIVIL,ZESTRIL) 20 MG tablet TAKE ONE TABLET BY MOUTH ONCE DAILY 90 tablet 3 05/14/2016 at 2000  . metoprolol tartrate (LOPRESSOR) 25 MG tablet Take 1 tablet (25 mg total) by mouth 2 (two) times daily. 180 tablet 1 05/15/2016 at 700  . nitroGLYCERIN (NITROSTAT) 0.4 MG SL tablet Place 1 tablet (0.4 mg total) under the tongue every 5 (five) minutes as needed. 25 tablet 3 05/15/2016 at 800  . oxyCODONE-acetaminophen (PERCOCET) 10-325 MG tablet Take 1 tablet by mouth every 4 (four) hours as needed for pain.   05/15/2016 at Unknown time  . simvastatin (ZOCOR) 20 MG tablet Take 20 mg by mouth daily.   05/14/2016 at Unknown time  . spironolactone  (ALDACTONE) 25 MG tablet Take 0.5 tablets (12.5 mg total) by mouth daily. 45 tablet 3 05/14/2016 at Unknown time  . warfarin (COUMADIN) 5 MG tablet Take 1 1/2 tablets daily except 1 tablet on Sundays and Thursdays 45 tablet 3 05/14/2016 at 2000  . citalopram (CELEXA) 20 MG tablet Take 20 mg by mouth every evening. Reported on 05/15/2016   Not Taking at Unknown time  . predniSONE (DELTASONE) 10 MG tablet 5,4,3,2,1 - take with food (Patient not taking: Reported on 04/19/2016) 15 tablet 0 Completed Course at Unknown time  . varenicline (CHANTIX PAK) 0.5 MG X 11 & 1 MG X 42 tablet Take by mouth 2 (two) times daily. Reported on 05/15/2016   Not Taking at Unknown time   Assessment: Okay for Protocol, INR at goal, no bleeding noted.  Goal of Therapy:  INR 2-3   Plan:  Repeat Warfarin 7.5mg  PO x 1 today. Daily PT/INR. Monitor for signs and symptoms of bleeding.   Lamonte Richer R 05/16/2016,8:13 AM

## 2016-05-17 LAB — BASIC METABOLIC PANEL
Anion gap: 4 — ABNORMAL LOW (ref 5–15)
BUN: 27 mg/dL — ABNORMAL HIGH (ref 6–20)
CO2: 24 mmol/L (ref 22–32)
Calcium: 8.8 mg/dL — ABNORMAL LOW (ref 8.9–10.3)
Chloride: 112 mmol/L — ABNORMAL HIGH (ref 101–111)
Creatinine, Ser: 1.33 mg/dL — ABNORMAL HIGH (ref 0.61–1.24)
GFR calc Af Amer: >60 mL/min (ref >60)
GFR calc non Af Amer: >60 mL/min (ref >60)
GLUCOSE: 115 mg/dL — AB (ref 65–99)
Potassium: 4.2 mmol/L (ref 3.5–5.1)
Sodium: 140 mmol/L (ref 135–145)

## 2016-05-17 LAB — URINE CULTURE: Culture: NO GROWTH

## 2016-05-17 LAB — PROTIME-INR
INR: 2.81 — ABNORMAL HIGH (ref 0.00–1.49)
PROTHROMBIN TIME: 29.1 s — AB (ref 11.6–15.2)

## 2016-05-17 LAB — PROCALCITONIN

## 2016-05-17 MED ORDER — METOPROLOL TARTRATE 25 MG PO TABS: 25.0000 mg | ORAL_TABLET | Freq: Two times a day (BID) | ORAL | Status: DC

## 2016-05-17 MED ORDER — WARFARIN SODIUM 5 MG PO TABS: 5.0000 mg | ORAL_TABLET | Freq: Once | ORAL | Status: DC

## 2016-05-17 NOTE — Progress Notes (Signed)
ANTICOAGULATION CONSULT NOTE  Pharmacy Consult for Warfarin (home med) Indication: Mural Thrombus  Allergies  Allergen Reactions  . Benadryl [Diphenhydramine] Hives and Swelling   Patient Measurements: Height: 5\' 7"  (170.2 cm) Weight: 156 lb 14.4 oz (71.169 kg) IBW/kg (Calculated) : 66.1  Vital Signs: Temp: 98.1 F (36.7 C) (05/18 0520) Temp Source: Oral (05/18 0520) BP: 165/89 mmHg (05/18 0935) Pulse Rate: 60 (05/18 0935)  Labs:  Recent Labs  05/15/16 1336 05/15/16 2101 05/16/16 0245 05/16/16 0500 05/16/16 0930 05/17/16 0502  HGB 15.7  --   --  13.4  --   --   HCT 46.1  --   --  39.6  --   --   PLT 324  --   --  241  --   --   LABPROT 23.5*  --   --  24.9*  --  29.1*  INR 2.11*  --   --  2.27*  --  2.81*  CREATININE 4.59*  --  2.93*  --   --  1.33*  TROPONINI 0.03 0.03 0.04*  --  0.03  --    Estimated Creatinine Clearance: 62.1 mL/min (by C-G formula based on Cr of 1.33).  Medical History: Past Medical History  Diagnosis Date  . CAD (coronary artery disease)     Multivessel, LVEF 45-50%  . Cerebrovascular disease     Stroke 2004  . Hyperlipidemia   . Hypertension   . Left ventricular mural thrombus ([facility])     Apical  . Depression    Medications:  Prescriptions prior to admission  Medication Sig Dispense Refill Last Dose  . aspirin EC 81 MG tablet Take 81 mg by mouth daily.   05/15/2016 at Unknown time  . lisinopril (PRINIVIL,ZESTRIL) 20 MG tablet TAKE ONE TABLET BY MOUTH ONCE DAILY 90 tablet 3 05/14/2016 at 2000  . metoprolol tartrate (LOPRESSOR) 25 MG tablet Take 1 tablet (25 mg total) by mouth 2 (two) times daily. 180 tablet 1 05/15/2016 at 700  . nitroGLYCERIN (NITROSTAT) 0.4 MG SL tablet Place 1 tablet (0.4 mg total) under the tongue every 5 (five) minutes as needed. 25 tablet 3 05/15/2016 at 800  . oxyCODONE-acetaminophen (PERCOCET) 10-325 MG tablet Take 1 tablet by mouth every 4 (four) hours as needed for pain.   05/15/2016 at Unknown time  . simvastatin  (ZOCOR) 20 MG tablet Take 20 mg by mouth daily.   05/14/2016 at Unknown time  . spironolactone (ALDACTONE) 25 MG tablet Take 0.5 tablets (12.5 mg total) by mouth daily. 45 tablet 3 05/14/2016 at Unknown time  . warfarin (COUMADIN) 5 MG tablet Take 1 1/2 tablets daily except 1 tablet on Sundays and Thursdays 45 tablet 3 05/14/2016 at 2000  . citalopram (CELEXA) 20 MG tablet Take 20 mg by mouth every evening. Reported on 05/15/2016   Not Taking at Unknown time  . predniSONE (DELTASONE) 10 MG tablet 5,4,3,2,1 - take with food (Patient not taking: Reported on 04/19/2016) 15 tablet 0 Completed Course at Unknown time  . varenicline (CHANTIX PAK) 0.5 MG X 11 & 1 MG X 42 tablet Take by mouth 2 (two) times daily. Reported on 05/15/2016   Not Taking at Unknown time   Assessment: Okay for Protocol, INR at goal, no bleeding noted.  INR trending up to upper end of range.    Goal of Therapy:  INR 2-3   Plan:  Warfarin 5mg  PO x 1 (home dose per PTA med list) Daily PT/INR. Monitor for signs and symptoms of bleeding.  Margo Aye, Keshav Winegar A 05/17/2016,11:23 AM

## 2016-05-17 NOTE — Progress Notes (Signed)
Pt's IV catheter removed and intact. Pt's IV site clean dry and intact. Discharge instructions including follow up appointments and medications were reviewed and discussed with patient. All questions were answered and no further questions at this  Time. Pt verbalized understanding of discharge instructions including medications. Pt in stable condition and in no acute distress at this time. Pt will be escorted by nurse tech.

## 2016-05-17 NOTE — Discharge Summary (Signed)
Physician Discharge Summary  Steve Vang:[id number] DOB: 05-24-1966 DOA: 05/15/2016  PCP: Isabella Stalling, MD  Admit date: 05/15/2016 Discharge date: 05/17/2016   Recommendations for Outpatient Follow-up:  The patient is recommended to cease smoking of cigarettes to take his Coumadin as prescribed before admission to continue all other previous medicines with the exception of spironolactone and to follow-up my office in 2 days' time to assess renal function Discharge Diagnoses:  Principal Problem:   AKI (acute kidney injury) ([facility]) Active Problems:   Essential hypertension, benign   CORONARY ATHEROSCLEROSIS NATIVE CORONARY ARTERY   Mural thrombus of left ventricle ([facility])   Long term (current) use of anticoagulants   Nausea and vomiting   Abdominal pain   Chest pain   Emesis   Discharge Condition: Good and improving  Filed Weights   05/15/16 2030 05/16/16 0500 05/17/16 0527  Weight: 69.31 kg (152 lb 12.8 oz) 65.59 kg (144 lb 9.6 oz) 71.169 kg (156 lb 14.4 oz)    History of present illness:  Patient has severe COPD chronic atrial fibrillation with mural thrombus currently anticoagulated with presented with episodes of nausea vomiting gastroenteritis and volume depletion for a period of 48 hours his intravascular volume depletion created to prerenal acidemia with subsequent bump in his creatinine to 4 he was treated with aggressive fluid resuscitation was renal impairment anti-medics and soft liquid diet which she tolerated rather well the patient began to feel better had less nausea no vomiting and had a 2-D had a abdominal sono ram which revealed 1 gallbladder stone without any other stigmata of Coley cystitis he was felt to be able to be discharged with close monitoring of his anticoagulation as well as his renal function Hospital Course:  See history of present illness above Procedures:  Abdominal sonogram  Consultations:  Discharge Instructions  Discharge  Instructions    Discharge instructions    Complete by:  As directed      Discharge patient    Complete by:  As directed             Medication List    STOP taking these medications        predniSONE 10 MG tablet  Commonly known as:  DELTASONE     spironolactone 25 MG tablet  Commonly known as:  ALDACTONE      TAKE these medications        aspirin EC 81 MG tablet  Take 81 mg by mouth daily.     citalopram 20 MG tablet  Commonly known as:  CELEXA  Take 20 mg by mouth every evening. Reported on 05/15/2016     lisinopril 20 MG tablet  Commonly known as:  PRINIVIL,ZESTRIL  TAKE ONE TABLET BY MOUTH ONCE DAILY     metoprolol tartrate 25 MG tablet  Commonly known as:  LOPRESSOR  Take 1 tablet (25 mg total) by mouth 2 (two) times daily.     metoprolol tartrate 25 MG tablet  Commonly known as:  LOPRESSOR  Take 1 tablet (25 mg total) by mouth 2 (two) times daily.     nitroGLYCERIN 0.4 MG SL tablet  Commonly known as:  NITROSTAT  Place 1 tablet (0.4 mg total) under the tongue every 5 (five) minutes as needed.     oxyCODONE-acetaminophen 10-325 MG tablet  Commonly known as:  PERCOCET  Take 1 tablet by mouth every 4 (four) hours as needed for pain.     simvastatin 20 MG tablet  Commonly known as:  ZOCOR  Take 20 mg by mouth daily.     varenicline 0.5 MG X 11 & 1 MG X 42 tablet  Commonly known as:  CHANTIX PAK  Take by mouth 2 (two) times daily. Reported on 05/15/2016     warfarin 5 MG tablet  Commonly known as:  COUMADIN  Take 1 1/2 tablets daily except 1 tablet on Sundays and Thursdays       Allergies  Allergen Reactions  . Benadryl [Diphenhydramine] Hives and Swelling      The results of significant diagnostics from this hospitalization (including imaging, microbiology, ancillary and laboratory) are listed below for reference.    Significant Diagnostic Studies: Dg Chest 2 View  05/15/2016  CLINICAL DATA:  Left anterior chest pain since this morning EXAM:  CHEST  2 VIEW COMPARISON:  September 03, 2010 FINDINGS: The heart size and mediastinal contours are stable. Patient status post prior CABG and median sternotomy. Both lungs are clear. The visualized skeletal structures are unremarkable. IMPRESSION: No active cardiopulmonary disease. Electronically Signed   By: Sherian Rein M.D.   On: 05/15/2016 14:09   Dg Pelvis 1-2 Views  04/19/2016  Clinical:  History of avascular necrosis of both hips by CT, left hip more tender X-rays made of the pelvis, single AP view There is deformity of the right femoral head with cyst formation, irregular head consistent with avascular necrosis.  No fracture is present of the femoral neck. The left femoral head has less irregularity, several small cysts, no fracture.   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  US Abdomen Complete  05/16/2016  CLINICAL DATA:  Abdominal pain for 1 week EXAM: ABDOMEN ULTRASOUND COMPLETE COMPARISON:  CT abdomen and pelvis May 15, 2016 FINDINGS: Gallbladder: Gallbladder appears contracted. There is a 4 mm echogenic focus within the gallbladder which shadows consistent with gallstone. No pericholecystic fluid. No sonographic Murphy sign noted by sonographer. Common bile duct: Diameter: 3 mm. No intrahepatic, common hepatic, or common bile duct dilatation. Liver: No focal lesion identified. Liver echogenicity is overall increased. IVC: No abnormality visualized. Pancreas: Visualized portion unremarkable. Portions of pancreas obscured by gas. Spleen: Size and appearance within normal limits. Right Kidney: Length: 10.1 cm. Echogenicity within normal limits. No mass or hydronephrosis visualized. Left Kidney: Length: 10.2 cm. Echogenicity within normal limits. No mass or hydronephrosis visualized. Abdominal aorta: No aneurysm visualized. Other findings: No demonstrable ascites. IMPRESSION: Cholelithiasis. Gallbladder somewhat contracted. No pericholecystic fluid. A  degree of cholecystitis must be of concern given this overall appearance. It may be reasonable to correlate with nuclear medicine hepatobiliary imaging study to assess for cystic duct patency. Portions of pancreas obscured by gas. Visualized portions of pancreas appear normal. Increased liver echogenicity, a finding most likely due to hepatic steatosis. While no focal liver lesions are identified, it must be cautioned that sensitivity of ultrasound for focal liver lesions is diminished in this circumstance. Study otherwise unremarkable. Electronically Signed   By: Bretta Bang III M.D.   On: 05/16/2016 13:54   Ct Renal Stone Study  05/15/2016  CLINICAL DATA:  Upper abdominal pain for 1 week. EXAM: CT ABDOMEN AND PELVIS WITHOUT CONTRAST TECHNIQUE: Multidetector CT imaging of the abdomen and pelvis was performed following the standard protocol without IV contrast. COMPARISON:  None. FINDINGS: Lower chest:  The lung bases appear clear.  No pleural effusion. Hepatobiliary: There is hepatic steatosis noted. The gallbladder appears within normal limits. Pancreas: No mass or inflammatory process identified on  this un-enhanced exam. Spleen: Within normal limits in size. Adrenals/Urinary Tract: Normal adrenal glands. The kidneys are unremarkable. No kidney stones or nephrolithiasis. The urinary bladder is normal. Stomach/Bowel: The stomach is normal. The small bowel loops have a normal course and caliber. The appendix is visualized and is upper limits of normal in diameter measuring 7 mm. No periappendiceal fat stranding or free fluid identified. No pathologic dilatation of the colon. Vascular/Lymphatic: No pathologically enlarged lymph nodes. No evidence of abdominal aortic aneurysm. Reproductive: No mass or other significant abnormality. Other: None. Musculoskeletal: Evidence of bilateral hip avascular necrosis with sub chondral fracture is again identified. IMPRESSION: 1. No acute findings identified within the  abdomen or pelvis. 2. Aortic atherosclerosis noted. 3. The appendix is visualized and is upper limits of normal in caliber. No periappendiceal fat stranding or free fluid. Correlate for any focal tenderness in this area. 4. Bilateral hip avascular necrosis. Electronically Signed   By: Signa Kell M.D.   On: 05/15/2016 20:24    Microbiology: Recent Results (from the past 240 hour(s))  Urine culture     Status: None   Collection Time: 05/15/16  2:46 PM  Result Value Ref Range Status   Specimen Description URINE, CLEAN CATCH  Final   Special Requests NONE  Final   Culture NO GROWTH Performed at [facility]   Final   Report Status 05/17/2016 FINAL  Final     Labs: Basic Metabolic Panel:  Recent Labs Lab 05/15/16 1336 05/16/16 0245 05/17/16 0502  NA 135 137 140  K 4.6 4.4 4.2  CL 98* 107 112*  CO2 25 24 24   GLUCOSE 108* 109* 115*  BUN 49* 45* 27*  CREATININE 4.59* 2.93* 1.33*  CALCIUM 9.9 8.5* 8.8*   Liver Function Tests:  Recent Labs Lab 05/15/16 1406 05/16/16 0245  AST 26 21  ALT 27 21  ALKPHOS 122 90  BILITOT 0.6 0.3  PROT 8.0 6.6  ALBUMIN 4.4 3.3*    Recent Labs Lab 05/15/16 1406  LIPASE 23   No results for input(s): AMMONIA in the last 168 hours. CBC:  Recent Labs Lab 05/15/16 1336 05/16/16 0500  WBC 8.7 4.6  HGB 15.7 13.4  HCT 46.1 39.6  MCV 87.5 87.8  PLT 324 241   Cardiac Enzymes:  Recent Labs Lab 05/15/16 1336 05/15/16 2101 05/16/16 0245 05/16/16 0930  TROPONINI 0.03 0.03 0.04* 0.03   BNP: BNP (last 3 results) No results for input(s): BNP in the last 8760 hours.  ProBNP (last 3 results) No results for input(s): PROBNP in the last 8760 hours.  CBG: No results for input(s): GLUCAP in the last 168 hours.     Signed:  Leith Hedlund Judie Petit  [facility] Pager: [phone] 05/17/2016, 11:49 AM

## 2016-05-24 ENCOUNTER — Encounter (HOSPITAL_COMMUNITY): Payer: Self-pay | Admitting: Emergency Medicine

## 2016-05-24 ENCOUNTER — Emergency Department (HOSPITAL_COMMUNITY)
Admission: EM | Admit: 2016-05-24 | Discharge: 2016-05-24 | Disposition: A | Discharge status: Home / Self Care | Payer: Medicaid Other | Attending: Emergency Medicine | Admitting: Emergency Medicine

## 2016-05-24 DIAGNOSIS — Z8673 Personal history of transient ischemic attack (TIA), and cerebral infarction without residual deficits: Secondary | ICD-10-CM | POA: Insufficient documentation

## 2016-05-24 DIAGNOSIS — I2510 Atherosclerotic heart disease of native coronary artery without angina pectoris: Secondary | ICD-10-CM | POA: Diagnosis not present

## 2016-05-24 DIAGNOSIS — M791 Myalgia, unspecified site: Secondary | ICD-10-CM

## 2016-05-24 DIAGNOSIS — Z79899 Other long term (current) drug therapy: Secondary | ICD-10-CM | POA: Insufficient documentation

## 2016-05-24 DIAGNOSIS — F17210 Nicotine dependence, cigarettes, uncomplicated: Secondary | ICD-10-CM | POA: Diagnosis not present

## 2016-05-24 DIAGNOSIS — F329 Major depressive disorder, single episode, unspecified: Secondary | ICD-10-CM | POA: Diagnosis not present

## 2016-05-24 DIAGNOSIS — Z7901 Long term (current) use of anticoagulants: Secondary | ICD-10-CM | POA: Diagnosis not present

## 2016-05-24 DIAGNOSIS — Z7982 Long term (current) use of aspirin: Secondary | ICD-10-CM | POA: Diagnosis not present

## 2016-05-24 DIAGNOSIS — I10 Essential (primary) hypertension: Secondary | ICD-10-CM | POA: Insufficient documentation

## 2016-05-24 DIAGNOSIS — E785 Hyperlipidemia, unspecified: Secondary | ICD-10-CM | POA: Insufficient documentation

## 2016-05-24 DIAGNOSIS — R110 Nausea: Secondary | ICD-10-CM | POA: Insufficient documentation

## 2016-05-24 DIAGNOSIS — M549 Dorsalgia, unspecified: Secondary | ICD-10-CM | POA: Diagnosis present

## 2016-05-24 LAB — URINALYSIS, ROUTINE W REFLEX MICROSCOPIC
Bilirubin Urine: NEGATIVE
GLUCOSE, UA: NEGATIVE mg/dL
Ketones, ur: NEGATIVE mg/dL
LEUKOCYTES UA: NEGATIVE
NITRITE: NEGATIVE
PH: 5.5 (ref 5.0–8.0)
Specific Gravity, Urine: 1.025 (ref 1.005–1.030)

## 2016-05-24 LAB — COMPREHENSIVE METABOLIC PANEL
ALT: 23 U/L (ref 17–63)
AST: 25 U/L (ref 15–41)
Albumin: 4.2 g/dL (ref 3.5–5.0)
Alkaline Phosphatase: 105 U/L (ref 38–126)
Anion gap: 8 (ref 5–15)
BUN: 21 mg/dL — AB (ref 6–20)
CHLORIDE: 100 mmol/L — AB (ref 101–111)
CO2: 28 mmol/L (ref 22–32)
CREATININE: 1.27 mg/dL — AB (ref 0.61–1.24)
Calcium: 10 mg/dL (ref 8.9–10.3)
GFR calc Af Amer: >60 mL/min (ref >60)
GFR calc non Af Amer: >60 mL/min (ref >60)
Glucose, Bld: 101 mg/dL — ABNORMAL HIGH (ref 65–99)
Potassium: 4.1 mmol/L (ref 3.5–5.1)
SODIUM: 136 mmol/L (ref 135–145)
Total Bilirubin: 0.6 mg/dL (ref 0.3–1.2)
Total Protein: 8.1 g/dL (ref 6.5–8.1)

## 2016-05-24 LAB — CBC WITH DIFFERENTIAL/PLATELET
BASOS ABS: 0 10*3/uL (ref 0.0–0.1)
Basophils Relative: 0 %
Eosinophils Absolute: 0.1 10*3/uL (ref 0.0–0.7)
Eosinophils Relative: 2 %
HCT: 43.8 % (ref 39.0–52.0)
HEMOGLOBIN: 14.9 g/dL (ref 13.0–17.0)
LYMPHS ABS: 1.9 10*3/uL (ref 0.7–4.0)
Lymphocytes Relative: 27 %
MCH: 29.9 pg (ref 26.0–34.0)
MCHC: 34 g/dL (ref 30.0–36.0)
MCV: 88 fL (ref 78.0–100.0)
MONO ABS: 0.7 10*3/uL (ref 0.1–1.0)
MONOS PCT: 10 %
Neutro Abs: 4.4 10*3/uL (ref 1.7–7.7)
Neutrophils Relative %: 61 %
Platelets: 264 10*3/uL (ref 150–400)
RBC: 4.98 MIL/uL (ref 4.22–5.81)
RDW: 14.2 % (ref 11.5–15.5)
WBC: 7.2 10*3/uL (ref 4.0–10.5)

## 2016-05-24 LAB — URINE MICROSCOPIC-ADD ON

## 2016-05-24 LAB — PROTIME-INR
INR: 1.97 — AB (ref 0.00–1.49)
Prothrombin Time: 22.3 seconds — ABNORMAL HIGH (ref 11.6–15.2)

## 2016-05-24 LAB — LIPASE, BLOOD: LIPASE: 17 U/L (ref 11–51)

## 2016-05-24 NOTE — ED Notes (Signed)
Pt states he has been having back pain all day.  States he was admitted last week with acute kidney injury and he just does not feel right and feels that something is wrong with his kidneys again.  Pain does not worsen with movement or change in position.

## 2016-05-24 NOTE — Discharge Instructions (Signed)

## 2016-05-24 NOTE — ED Provider Notes (Signed)
CSN: [id number]     Arrival date & time 05/24/16  1214 History   By signing my name below, I, Linus Galas, attest that this documentation has been prepared under the direction and in the presence of Zadie Rhine, MD. Electronically Signed: Linus Galas, ED Scribe. 05/24/2016. 12:47 PM.   Chief Complaint  Patient presents with  . Back Pain   The history is provided by the patient. No language interpreter was used.   HPI Comments: Steve Vang is a 50 y.o. 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 Pt also reports nausea. Pt denies any pain with eating. Pt denies any falls or injuries. Pt denies any HA, CP, abdominal pain, vomiting, urinary symptoms. Pt is on coumadin. Pt has been using crutches since September 2016 from chronic hip pain  Pt was hospitalized on 05/15/16 for a kidney injury.  Scheduled for bilaterally hip replacement on June 9th, 2017 to be preformed by Dr. Magnus Ivan.   Past Medical History  Diagnosis Date  . CAD (coronary artery disease)     Multivessel, LVEF 45-50%  . Cerebrovascular disease     Stroke 2004  . Hyperlipidemia   . Hypertension   . Left ventricular mural thrombus (HCC)     Apical  . Depression    Past Surgical History  Procedure Laterality Date  . Coronary artery bypass graft      DOR anterior ventricular restoration surgery 8/06, [facility] [place] to first diagonal, SVG to PLB, SVG to RVE branch of nondominant RCA  . Eye surgery     Family History  Problem Relation Age of Onset  . Hypertension Mother   . Diabetes Mother    Social History  Substance Use Topics  . Smoking status: Current Every Day Smoker -- 0.50 packs/day    Types: Cigarettes  . Smokeless tobacco: Never Used  . Alcohol Use: Yes     Comment: occ    Review of Systems  Cardiovascular: Negative for chest pain.  Gastrointestinal: Positive for nausea.  Negative for vomiting and abdominal pain.  Genitourinary: Negative for dysuria and hematuria.  Musculoskeletal: Positive for back pain.  Neurological: Negative for headaches.  All other systems reviewed and are negative.   Allergies  Benadryl  Home Medications   Prior to Admission medications   Medication Sig Start Date End Date Taking? Authorizing Provider  aspirin EC 81 MG tablet Take 81 mg by mouth daily.    Historical Provider, MD  citalopram (CELEXA) 20 MG tablet Take 20 mg by mouth every evening. Reported on 05/15/2016    Historical Provider, MD  lisinopril (PRINIVIL,ZESTRIL) 20 MG tablet TAKE ONE TABLET BY MOUTH ONCE DAILY 08/29/15   Jodelle Gross, NP  metoprolol tartrate (LOPRESSOR) 25 MG tablet Take 1 tablet (25 mg total) by mouth 2 (two) times daily. 03/09/16   Jonelle Sidle, MD  metoprolol tartrate (LOPRESSOR) 25 MG tablet Take 1 tablet (25 mg total) by mouth 2 (two) times daily. 05/17/16   Oval Linsey, MD  nitroGLYCERIN (NITROSTAT) 0.4 MG SL tablet Place 1 tablet (0.4 mg total) under the tongue every 5 (five) minutes as needed. 09/03/14   Jonelle Sidle, MD  oxyCODONE-acetaminophen (PERCOCET) 10-325 MG tablet Take 1 tablet by mouth every 4 (four) hours as needed for pain.    Historical Provider, MD  simvastatin (ZOCOR) 20 MG tablet Take 20 mg by mouth daily.  Historical Provider, MD  varenicline (CHANTIX PAK) 0.5 MG X 11 & 1 MG X 42 tablet Take by mouth 2 (two) times daily. Reported on 05/15/2016    Historical Provider, MD  warfarin (COUMADIN) 5 MG tablet Take 1 1/2 tablets daily except 1 tablet on Sundays and Thursdays 03/21/16   Jonelle Sidle, MD   BP 117/62 mmHg  Pulse 62  Temp(Src) 98 F (36.7 C) (Oral)  Resp 18  Ht  (1.702 m)  Wt 154 lb (69.854 kg)  BMI 24.11 kg/m2  SpO2 99%   Physical Exam CONSTITUTIONAL: Well developed/well nourished HEAD: Normocephalic/atraumatic EYES: EOMI/PERRL ENMT: Mucous membranes moist NECK: supple no meningeal  signs SPINE/BACK:entire spine nontender CV: S1/S2 noted, no murmurs/rubs/gallops noted LUNGS: Lungs are clear to auscultation bilaterally, no apparent distress ABDOMEN: soft, nontender, no rebound or guarding, bowel sounds noted throughout abdomen ZO:[id number] cva tenderness NEURO: Pt is awake/alert/appropriate, moves all extremitiesx4.  No facial droop, no focal weakness in BLE   EXTREMITIES: pulses normal/equal, full ROM SKIN: warm, color normal PSYCH: no abnormalities of mood noted, alert and oriented to situation  ED Course  Procedures  DIAGNOSTIC STUDIES: Oxygen Saturation is 99% on room air, normal by my interpretation.    COORDINATION OF CARE: 12:38 PM Discussed treatment plan with pt at bedside and pt agreed to plan.  Labs Review Labs Reviewed  COMPREHENSIVE METABOLIC PANEL - Abnormal; Notable for the following:    Chloride 100 (*)    Glucose, Bld 101 (*)    BUN 21 (*)    Creatinine, Ser 1.27 (*)    All other components within normal limits  URINALYSIS, ROUTINE W REFLEX MICROSCOPIC (NOT AT [facility]) - Abnormal; Notable for the following:    Hgb urine dipstick TRACE (*)    Protein, ur TRACE (*)    All other components within normal limits  PROTIME-INR - Abnormal; Notable for the following:    Prothrombin Time 22.3 (*)    INR 1.97 (*)    All other components within normal limits  URINE MICROSCOPIC-ADD ON - Abnormal; Notable for the following:    Squamous Epithelial / LPF 0-5 (*)    Bacteria, UA RARE (*)    Casts HYALINE CASTS (*)    All other components within normal limits  CBC WITH DIFFERENTIAL/PLATELET  LIPASE, BLOOD    I have personally reviewed and evaluated these  lab results as part of my medical decision-making.  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 The recorded information has been reviewed and is accurate.       Zadie Rhine, MD 05/24/16 [phone]

## 2016-05-30 ENCOUNTER — Ambulatory Visit (INDEPENDENT_AMBULATORY_CARE_PROVIDER_SITE_OTHER): Payer: Medicaid Other | Admitting: *Deleted

## 2016-05-30 ENCOUNTER — Other Ambulatory Visit: Payer: Self-pay | Admitting: Physician Assistant

## 2016-05-30 DIAGNOSIS — I513 Intracardiac thrombosis, not elsewhere classified: Secondary | ICD-10-CM

## 2016-05-30 DIAGNOSIS — I213 ST elevation (STEMI) myocardial infarction of unspecified site: Secondary | ICD-10-CM | POA: Diagnosis not present

## 2016-05-30 DIAGNOSIS — Z5181 Encounter for therapeutic drug level monitoring: Secondary | ICD-10-CM

## 2016-05-30 LAB — POCT INR: INR: 3.2

## 2016-05-30 MED ORDER — ENOXAPARIN SODIUM 120 MG/0.8ML ~~LOC~~ SOLN: 120.0000 mg | SUBCUTANEOUS | Status: DC

## 2016-05-30 NOTE — Patient Instructions (Signed)
Wt 74kg     Labs 05/24/16  SCr 1.27  CrCl 72.83                        Hgb 14.9   Hct 43.8  6/2  Take last dose of coumadin 6/3  No lovenox or coumadin 6/4  Lovenox 120mg  SQ @ 8am 6/5  Lovenox 120mg  SQ @ 8am 6/6  Lovenox 120mg  SQ @ 8am 6/7  Lovenox 120mg  SQ @ 8am 6/8  Lovenox 120mg  SQ @ 8am 6/9  No Lovenox -----Surgery---------admit to hospital

## 2016-05-31 ENCOUNTER — Encounter (HOSPITAL_COMMUNITY): Payer: Self-pay

## 2016-05-31 ENCOUNTER — Encounter (HOSPITAL_COMMUNITY)
Admission: RE | Admit: 2016-05-31 | Discharge: 2016-05-31 | Disposition: A | Discharge status: Home / Self Care | Payer: Medicaid Other | Source: Ambulatory Visit | Attending: Orthopaedic Surgery | Admitting: Orthopaedic Surgery

## 2016-05-31 DIAGNOSIS — M160 Bilateral primary osteoarthritis of hip: Secondary | ICD-10-CM | POA: Insufficient documentation

## 2016-05-31 DIAGNOSIS — Z01812 Encounter for preprocedural laboratory examination: Secondary | ICD-10-CM | POA: Insufficient documentation

## 2016-05-31 HISTORY — DX: Noninfective gastroenteritis and colitis, unspecified: K52.9

## 2016-05-31 HISTORY — DX: Acute myocardial infarction, unspecified: I21.9

## 2016-05-31 HISTORY — DX: Cerebral infarction, unspecified: I63.9

## 2016-05-31 HISTORY — DX: Cardiomyopathy, unspecified: I42.9

## 2016-05-31 HISTORY — DX: Calculus of gallbladder without cholecystitis without obstruction: K80.20

## 2016-05-31 HISTORY — DX: Unspecified fall, initial encounter: W19.XXXA

## 2016-05-31 HISTORY — DX: Chronic obstructive pulmonary disease, unspecified: J44.9

## 2016-05-31 LAB — BASIC METABOLIC PANEL
ANION GAP: 7 (ref 5–15)
BUN: 25 mg/dL — AB (ref 6–20)
CO2: 28 mmol/L (ref 22–32)
Calcium: 10.2 mg/dL (ref 8.9–10.3)
Chloride: 103 mmol/L (ref 101–111)
Creatinine, Ser: 1.41 mg/dL — ABNORMAL HIGH (ref 0.61–1.24)
GFR, EST NON AFRICAN AMERICAN: 57 mL/min — AB (ref >60)
Glucose, Bld: 111 mg/dL — ABNORMAL HIGH (ref 65–99)
POTASSIUM: 5.2 mmol/L — AB (ref 3.5–5.1)
SODIUM: 138 mmol/L (ref 135–145)

## 2016-05-31 LAB — SURGICAL PCR SCREEN
MRSA, PCR: NEGATIVE
STAPHYLOCOCCUS AUREUS: NEGATIVE

## 2016-05-31 NOTE — Progress Notes (Signed)
BMP results in epic per PAT visit 05/31/2016 sent to Dr Maureen Ralphs

## 2016-05-31 NOTE — Progress Notes (Signed)
Dr Crews/anesthesia reviewed pts H&P. No orders given. Anesthesia to see pt day of surgery.

## 2016-05-31 NOTE — Patient Instructions (Signed)
DARA CAMARGO  05/31/2016   Your procedure is scheduled on: Friday June 08, 2016  Report to [facility]  Entrance take [place]  elevators to 3rd floor to  Short Stay Center at 12:00 PM.  Call this number if you have problems the morning of surgery [phone]   Remember: ONLY 1 PERSON MAY GO WITH YOU TO SHORT STAY TO GET  READY MORNING OF YOUR SURGERY.  Do not eat food After Midnight but may take clear liquid diet till 8:00 am day of surgery then nothing by mouth.     Take these medicines the morning of surgery with A SIP OF WATER: Metoprolol; May take Oxycodone if needed;                                You may not have any metal on your body including hair pins and              piercings  Do not wear jewelry,  lotions, powders or colognes, deodorant                        Men may shave face and neck.   Do not bring valuables to the hospital. [facility] IS NOT             RESPONSIBLE   FOR VALUABLES.  Contacts, dentures or bridgework may not be worn into surgery.  Leave suitcase in the car. After surgery it may be brought to your room.               Please read over the following fact sheets you were given:MRSA INFORMATION SHEET; INCENTIVE SPIROMETER  _____________________________________________________________________             [facility] - Preparing for Surgery Before surgery, you can play an important role.  Because skin is not sterile, your skin needs to be as free of germs as possible.  You can reduce the number of germs on your skin by washing with CHG (chlorahexidine gluconate) soap before surgery.  CHG is an antiseptic cleaner which kills germs and bonds with the skin to continue killing germs even after washing. Please DO NOT use if you have an allergy to CHG or antibacterial soaps.  If your skin becomes reddened/irritated stop using the CHG and inform your nurse when you arrive at Short Stay. Do not shave (including legs and underarms) for at  least 48 hours prior to the first CHG shower.  You may shave your face/neck. Please follow these instructions carefully:  1.  Shower with CHG Soap the night before surgery and the  morning of Surgery.  2.  If you choose to wash your hair, wash your hair first as usual with your  normal  shampoo.  3.  After you shampoo, rinse your hair and body thoroughly to remove the  shampoo.                           4.  Use CHG as you would any other liquid soap.  You can apply chg directly  to the skin and wash                       Gently with a scrungie or clean washcloth.  5.  Apply the CHG Soap to your body ONLY FROM THE NECK DOWN.   Do not use on face/ open                           Wound or open sores. Avoid contact with eyes, ears mouth and genitals (private parts).                       Wash face,  Genitals (private parts) with your normal soap.             6.  Wash thoroughly, paying special attention to the area where your surgery  will be performed.  7.  Thoroughly rinse your body with warm water from the neck down.  8.  DO NOT shower/wash with your normal soap after using and rinsing off  the CHG Soap.                9.  Pat yourself dry with a clean towel.            10.  Wear clean pajamas.            11.  Place clean sheets on your bed the night of your first shower and do not  sleep with pets. Day of Surgery : Do not apply any lotions/deodorants the morning of surgery.  Please wear clean clothes to the hospital/surgery center.  FAILURE TO FOLLOW THESE INSTRUCTIONS MAY RESULT IN THE CANCELLATION OF YOUR SURGERY PATIENT SIGNATURE_________________________________  NURSE SIGNATURE__________________________________  ________________________________________________________________________   [name]  An incentive spirometer is a tool that can help keep your lungs clear and active. This tool measures how well you are filling your lungs with each breath. Taking long deep breaths  may help reverse or decrease the chance of developing breathing (pulmonary) problems (especially infection) following:  A long period of time when you are unable to move or be active. BEFORE THE PROCEDURE   If the spirometer includes an indicator to show your best effort, your nurse or respiratory therapist will set it to a desired goal.  If possible, sit up straight or lean slightly forward. Try not to slouch.  Hold the incentive spirometer in an upright position. INSTRUCTIONS FOR USE  1. Sit on the edge of your bed if possible, or sit up as far as you can in bed or on a chair. 2. Hold the incentive spirometer in an upright position. 3. Breathe out normally. 4. Place the mouthpiece in your mouth and seal your lips tightly around it. 5. Breathe in slowly and as deeply as possible, raising the piston or the ball toward the top of the column. 6. Hold your breath for 3-5 seconds or for as long as possible. Allow the piston or ball to fall to the bottom of the column. 7. Remove the mouthpiece from your mouth and breathe out normally. 8. Rest for a few seconds and repeat Steps 1 through 7 at least 10 times every 1-2 hours when you are awake. Take your time and take a few normal breaths between deep breaths. 9. The spirometer may include an indicator to show your best effort. Use the indicator as a goal to work toward during each repetition. 10. After each set of 10 deep breaths, practice coughing to be sure your lungs are clear. If you have an incision (the cut made at the time of surgery), support your incision when coughing by placing a  pillow or rolled up towels firmly against it. Once you are able to get out of bed, walk around indoors and cough well. You may stop using the incentive spirometer when instructed by your caregiver.  RISKS AND COMPLICATIONS  Take your time so you do not get dizzy or light-headed.  If you are in pain, you may need to take or ask for pain medication before doing  incentive spirometry. It is harder to take a deep breath if you are having pain. AFTER USE  Rest and breathe slowly and easily.  It can be helpful to keep track of a log of your progress. Your caregiver can provide you with a simple table to help with this. If you are using the spirometer at home, follow these instructions: SEEK MEDICAL CARE IF:   You are having difficultly using the spirometer.  You have trouble using the spirometer as often as instructed.  Your pain medication is not giving enough relief while using the spirometer.  You develop fever of 100.5 F (38.1 C) or higher. SEEK IMMEDIATE MEDICAL CARE IF:   You cough up bloody sputum that had not been present before.  You develop fever of 102 F (38.9 C) or greater.  You develop worsening pain at or near the incision site. MAKE SURE YOU:   Understand these instructions.  Will watch your condition.  Will get help right away if you are not doing well or get worse. Document Released: 04/29/2007 Document Revised: 03/10/2012 Document Reviewed: 06/30/2007 ExitCare Patient Information 2014 ExitCare, [place].   ________________________________________________________________________    CLEAR LIQUID DIET   Foods Allowed                                                                     Foods Excluded  Coffee and tea, regular and decaf                             liquids that you cannot  Plain Jell-O in any flavor                                             see through such as: Fruit ices (not with fruit pulp)                                     milk, soups, orange juice  Iced Popsicles                                    All solid food Carbonated beverages, regular and diet                                    Cranberry, grape and apple juices Sports drinks like Gatorade Lightly seasoned clear broth or consume(fat free) Sugar, honey syrup  Sample Menu Breakfast  Lunch                                      Supper Cranberry juice                    Beef broth                            Chicken broth Jell-O                                     Grape juice                           Apple juice Coffee or tea                        Jell-O                                      Popsicle                                                Coffee or tea                        Coffee or tea  _____________________________________________________________________

## 2016-06-05 ENCOUNTER — Encounter: Payer: Self-pay | Admitting: Cardiology

## 2016-06-05 ENCOUNTER — Ambulatory Visit (INDEPENDENT_AMBULATORY_CARE_PROVIDER_SITE_OTHER): Payer: Medicaid Other | Admitting: Cardiology

## 2016-06-05 VITALS — BP 138/76 | HR 56 | Ht 67.0 in | Wt 154.0 lb

## 2016-06-05 DIAGNOSIS — E782 Mixed hyperlipidemia: Secondary | ICD-10-CM

## 2016-06-05 DIAGNOSIS — I213 ST elevation (STEMI) myocardial infarction of unspecified site: Secondary | ICD-10-CM | POA: Diagnosis not present

## 2016-06-05 DIAGNOSIS — I255 Ischemic cardiomyopathy: Secondary | ICD-10-CM

## 2016-06-05 DIAGNOSIS — I2510 Atherosclerotic heart disease of native coronary artery without angina pectoris: Secondary | ICD-10-CM | POA: Diagnosis not present

## 2016-06-05 DIAGNOSIS — Z720 Tobacco use: Secondary | ICD-10-CM

## 2016-06-05 DIAGNOSIS — I513 Intracardiac thrombosis, not elsewhere classified: Secondary | ICD-10-CM

## 2016-06-05 NOTE — Progress Notes (Signed)
Cardiology Office Note  Date: 06/05/2016   ID: Steve Vang, DOB [birth year], MRN [id number]  PCP: Isabella Stalling, MD  Primary Cardiologist: [name], MD   Chief Complaint  Patient presents with  . Coronary Artery Disease    History of Present Illness: Steve Vang is a 50 y.o. male last seen in December 2016. He is here today with his wife for a follow-up visit. He has been stable from a cardiac perspective without angina symptoms. Recent records indicate hospitalization due to gastroenteritis with volume depletion and acute on chronic renal insufficiency. He improved with hydration.  Most recent echocardiogram was in December 2016 as outlined below. LVEF low normal without obvious LV mural thrombus defined.  He continues to follow in the anticoagulation clinic on Coumadin. Plan is for Lovenox bridge in preparation for hip surgery with Dr. Rayburn Ma on June 9.  Past Medical History  Diagnosis Date  . CAD (coronary artery disease)     Multivessel, LVEF 45-50%  . History of stroke 2004  . Hyperlipidemia   . Essential hypertension   . Left ventricular mural thrombus (HCC)     On Coumadin  . Depression   . Falls   . CKD (chronic kidney disease) stage 2, GFR 60-89 ml/min   . Gallstone   . Cardiomyopathy (HCC)     LVEF 50-55% December 2016  . Gastroenteritis   . COPD (chronic obstructive pulmonary disease) [facility])     Past Surgical History  Procedure Laterality Date  . Coronary artery bypass graft      DOR anterior ventricular restoration surgery 8/06, [facility] [place] to first diagonal, SVG to PLB, SVG to RVE branch of nondominant RCA  . Eye surgery      Current Outpatient Prescriptions  Medication Sig Dispense Refill  . aspirin EC 81 MG tablet Take 81 mg by mouth daily.    . citalopram (CELEXA) 20 MG tablet Take 20 mg by mouth every evening. Reported on 05/15/2016    . enoxaparin (LOVENOX) 120 MG/0.8ML injection Inject 0.8 mLs (120 mg total) into the  skin daily. 10 Syringe 1  . lisinopril (PRINIVIL,ZESTRIL) 20 MG tablet TAKE ONE TABLET BY MOUTH ONCE DAILY 90 tablet 3  . metoprolol tartrate (LOPRESSOR) 25 MG tablet Take 1 tablet (25 mg total) by mouth 2 (two) times daily. 60 tablet 3  . nitroGLYCERIN (NITROSTAT) 0.4 MG SL tablet Place 1 tablet (0.4 mg total) under the tongue every 5 (five) minutes as needed. (Patient taking differently: Place 0.4 mg under the tongue every 5 (five) minutes as needed for chest pain. ) 25 tablet 3  . oxyCODONE-acetaminophen (PERCOCET) 10-325 MG tablet Take 1 tablet by mouth every 4 (four) hours as needed for pain.    . simvastatin (ZOCOR) 20 MG tablet Take 20 mg by mouth daily.    Marland Kitchen warfarin (COUMADIN) 5 MG tablet Take 1 1/2 tablets daily except 1 tablet on Sundays and Thursdays (Patient taking differently: Take 5-7.5 mg by mouth daily after supper. Take one and a half tablets (7.5mg ) daily except on Sunday and Thursday. Take one tablet (5mg ) on those two days.) 45 tablet 3   No current facility-administered medications for this visit.   Allergies:  Benadryl and Chantix   Social History: The patient  reports that he has been smoking Cigarettes.  He has a 7.5 pack-year smoking history. He has never used smokeless tobacco. He reports that he does not drink alcohol or use illicit drugs.   ROS:  Please see the history of present illness. Otherwise, complete review of systems is positive for bilateral hip pain, using crutches.  All other systems are reviewed and negative.   Physical Exam: VS:  BP 138/76 mmHg  Pulse 56  Ht 5\' 7"  (1.702 m)  Wt 154 lb (69.854 kg)  BMI 24.11 kg/m2  SpO2 99%, BMI Body mass index is 24.11 kg/(m^2).  Wt Readings from Last 3 Encounters:  06/05/16 154 lb (69.854 kg)  05/31/16 150 lb (68.04 kg)  05/24/16 154 lb (69.854 kg)    General: Disheveled male, appears comfortable at rest. HEENT: Conjunctiva and lids normal, oropharynx clear with poor dentition. Neck: Supple, no elevated JVP  or carotid bruits, no thyromegaly. Lungs: Clear to auscultation, nonlabored breathing at rest. Cardiac: Regular rate and rhythm, no S3 or significant systolic murmur, no pericardial rub. Abdomen: Soft, nontender, bowel sounds present, no guarding or rebound. Extremities: No pitting edema, distal pulses 2+. Skin: Warm and dry. Musculoskeletal: No kyphosis. Neuropsychiatric: Alert and oriented x3, affect grossly appropriate.  ECG: I personally reviewed the tracing from 05/15/2016 which showed sinus rhythm with old anteroseptal and lateral infarct pattern, nonspecific ST changes.  Recent Labwork: 05/24/2016: ALT 23; AST 25; Hemoglobin 14.9; Platelets 264 05/31/2016: BUN 25*; Creatinine, Ser 1.41*; Potassium 5.2*; Sodium 138  April 2017: Hemoglobin 16, platelets 272, INR 2.0, potassium 4.1, BUN 13, creatinine 1.1, AST 15, ALT 16, cholesterol 269, triglycerides 227, HDL 35, LDL 189, TSH 2.2  Other Studies Reviewed Today:  Echocardiogram 12/28/2015: Study Conclusions  - Left ventricle: The cavity size was normal. There was mild  concentric hypertrophy. Systolic function was normal. The  estimated ejection fraction was in the range of 50% to 55%.  Features are consistent with a pseudonormal left ventricular  filling pattern, with concomitant abnormal relaxation and  increased filling pressure (grade 2 diastolic dysfunction).  Doppler parameters are consistent with high ventricular filling  pressure. - Regional wall motion abnormality: Akinesis of the mid  anteroseptal myocardium; mild hypokinesis of the apical septal  myocardium; probable mild hypokinesis of the apical myocardium. - Aortic valve: Mildly to moderately calcified annulus. - Mitral valve: Calcified annulus. Mildly thickened leaflets . - Right ventricle: Systolic function was mildly reduced.  Impressions:  - While the apical endocardium was suboptimally visualized, there  was no obvious thrombus seen.  Chest  x-ray 05/15/2016: FINDINGS: The heart size and mediastinal contours are stable. Patient status post prior CABG and median sternotomy. Both lungs are clear. The visualized skeletal structures are unremarkable.  IMPRESSION: No active cardiopulmonary disease.  Assessment and Plan:  1. Symptomatically stable multivessel CAD status post CABG in 2006. No active angina symptoms on current medical therapy which includes aspirin, lisinopril, Lopressor, and Zocor.  2. History of cardiomyopathy with previously documented LV mural thrombus. He is on chronic Coumadin for this indication (not atrial fibrillation). Most recent echocardiogram showed LVEF 50-55% without obvious thrombus present. He will be maintained on long-term anticoagulation in light of significant wall motion abnormality at the LV apex and high likelihood of recurrent thrombus off anticoagulation.  3. Pending bilateral hip surgery with Dr. Rayburn Ma on June 9. Coumadin is presently on hold with Lovenox bridge per the anticoagulation clinic.  4. Hyperlipidemia, on Zocor. Lipids were not optimal as of April. Discussed compliance, may need to escalate dose of statin.  5. Ongoing tobacco abuse, discussed smoking cessation. States that he did not tolerate Chantix recently.  Current medicines were reviewed with the patient today.  Disposition: FU with me in  3 months.   Signed, Jonelle Sidle, MD, [facility] 06/05/2016 9:04 AM    [facility] at [facility] [address] [place], [place] [address] Phone: [phone]; Fax: [phone]

## 2016-06-05 NOTE — Patient Instructions (Signed)
Your physician recommends that you schedule a follow-up appointment in: 3 months   Your physician recommends that you continue on your current medications as directed. Please refer to the Current Medication list given to you today.    If you need a refill on your cardiac medications before your next appointment, please call your pharmacy.     Thank you for choosing [facility] !         

## 2016-06-08 ENCOUNTER — Inpatient Hospital Stay (HOSPITAL_COMMUNITY): Payer: Medicaid Other | Admitting: Anesthesiology

## 2016-06-08 ENCOUNTER — Inpatient Hospital Stay (HOSPITAL_COMMUNITY): Payer: Medicaid Other

## 2016-06-08 ENCOUNTER — Inpatient Hospital Stay (HOSPITAL_COMMUNITY)
Admission: RE | Admit: 2016-06-08 | Discharge: 2016-06-10 | DRG: 470 | Disposition: A | Discharge status: Home Health | Payer: Medicaid Other | Source: Ambulatory Visit | Attending: Orthopaedic Surgery | Admitting: Orthopaedic Surgery

## 2016-06-08 ENCOUNTER — Encounter (HOSPITAL_COMMUNITY): Payer: Self-pay

## 2016-06-08 ENCOUNTER — Encounter (HOSPITAL_COMMUNITY)
Admission: RE | Disposition: A | Discharge status: Home Health | Payer: Self-pay | Source: Ambulatory Visit | Attending: Orthopaedic Surgery

## 2016-06-08 DIAGNOSIS — M879 Osteonecrosis, unspecified: Secondary | ICD-10-CM | POA: Diagnosis present

## 2016-06-08 DIAGNOSIS — Z7901 Long term (current) use of anticoagulants: Secondary | ICD-10-CM

## 2016-06-08 DIAGNOSIS — I129 Hypertensive chronic kidney disease with stage 1 through stage 4 chronic kidney disease, or unspecified chronic kidney disease: Secondary | ICD-10-CM | POA: Diagnosis present

## 2016-06-08 DIAGNOSIS — K8020 Calculus of gallbladder without cholecystitis without obstruction: Secondary | ICD-10-CM | POA: Diagnosis present

## 2016-06-08 DIAGNOSIS — I429 Cardiomyopathy, unspecified: Secondary | ICD-10-CM | POA: Diagnosis present

## 2016-06-08 DIAGNOSIS — E782 Mixed hyperlipidemia: Secondary | ICD-10-CM | POA: Diagnosis present

## 2016-06-08 DIAGNOSIS — D62 Acute posthemorrhagic anemia: Secondary | ICD-10-CM | POA: Diagnosis not present

## 2016-06-08 DIAGNOSIS — Z833 Family history of diabetes mellitus: Secondary | ICD-10-CM | POA: Diagnosis not present

## 2016-06-08 DIAGNOSIS — Z7982 Long term (current) use of aspirin: Secondary | ICD-10-CM | POA: Diagnosis not present

## 2016-06-08 DIAGNOSIS — Z79899 Other long term (current) drug therapy: Secondary | ICD-10-CM | POA: Diagnosis not present

## 2016-06-08 DIAGNOSIS — M87052 Idiopathic aseptic necrosis of left femur: Secondary | ICD-10-CM

## 2016-06-08 DIAGNOSIS — F17210 Nicotine dependence, cigarettes, uncomplicated: Secondary | ICD-10-CM | POA: Diagnosis present

## 2016-06-08 DIAGNOSIS — M1990 Unspecified osteoarthritis, unspecified site: Secondary | ICD-10-CM

## 2016-06-08 DIAGNOSIS — Z8673 Personal history of transient ischemic attack (TIA), and cerebral infarction without residual deficits: Secondary | ICD-10-CM

## 2016-06-08 DIAGNOSIS — Z8249 Family history of ischemic heart disease and other diseases of the circulatory system: Secondary | ICD-10-CM

## 2016-06-08 DIAGNOSIS — M25452 Effusion, left hip: Secondary | ICD-10-CM | POA: Diagnosis present

## 2016-06-08 DIAGNOSIS — J449 Chronic obstructive pulmonary disease, unspecified: Secondary | ICD-10-CM | POA: Diagnosis present

## 2016-06-08 DIAGNOSIS — Z888 Allergy status to other drugs, medicaments and biological substances status: Secondary | ICD-10-CM | POA: Diagnosis not present

## 2016-06-08 DIAGNOSIS — Z951 Presence of aortocoronary bypass graft: Secondary | ICD-10-CM

## 2016-06-08 DIAGNOSIS — I2510 Atherosclerotic heart disease of native coronary artery without angina pectoris: Secondary | ICD-10-CM | POA: Diagnosis present

## 2016-06-08 DIAGNOSIS — N182 Chronic kidney disease, stage 2 (mild): Secondary | ICD-10-CM | POA: Diagnosis present

## 2016-06-08 DIAGNOSIS — M25552 Pain in left hip: Secondary | ICD-10-CM | POA: Diagnosis present

## 2016-06-08 DIAGNOSIS — Z96642 Presence of left artificial hip joint: Secondary | ICD-10-CM

## 2016-06-08 DIAGNOSIS — W57XXXA Bitten or stung by nonvenomous insect and other nonvenomous arthropods, initial encounter: Secondary | ICD-10-CM | POA: Diagnosis present

## 2016-06-08 DIAGNOSIS — M87051 Idiopathic aseptic necrosis of right femur: Secondary | ICD-10-CM

## 2016-06-08 HISTORY — PX: TOTAL HIP ARTHROPLASTY: SHX124

## 2016-06-08 LAB — APTT: APTT: 26 s (ref 24–37)

## 2016-06-08 LAB — TYPE AND SCREEN
ABO/RH(D): B POS
ANTIBODY SCREEN: NEGATIVE

## 2016-06-08 LAB — ABO/RH: ABO/RH(D): B POS

## 2016-06-08 LAB — PROTIME-INR
INR: 0.89 (ref 0.00–1.49)
Prothrombin Time: 12.3 seconds (ref 11.6–15.2)

## 2016-06-08 SURGERY — ARTHROPLASTY, HIP, TOTAL, ANTERIOR APPROACH
Anesthesia: Monitor Anesthesia Care | Site: Hip | Laterality: Left

## 2016-06-08 MED ORDER — ONDANSETRON HCL 4 MG/2ML IJ SOLN: 4.0000 mg | Freq: Four times a day (QID) | INTRAMUSCULAR | Status: DC | PRN

## 2016-06-08 MED ORDER — MUPIROCIN 2 % EX OINT
TOPICAL_OINTMENT | Freq: Every day | CUTANEOUS | Status: DC
  Administered 2016-06-09: 09:00:00 via TOPICAL
  Filled 2016-06-08 (×2): qty 22

## 2016-06-08 MED ORDER — METOCLOPRAMIDE HCL 5 MG PO TABS: 5.0000 mg | ORAL_TABLET | Freq: Three times a day (TID) | ORAL | Status: DC | PRN

## 2016-06-08 MED ORDER — MIDAZOLAM HCL 2 MG/2ML IJ SOLN
INTRAMUSCULAR | Status: AC
  Administered 2016-06-08: 1 mg
  Filled 2016-06-08: qty 2

## 2016-06-08 MED ORDER — SIMVASTATIN 20 MG PO TABS
20.0000 mg | ORAL_TABLET | Freq: Every day | ORAL | Status: DC
  Administered 2016-06-09 – 2016-06-10 (×2): 20 mg via ORAL
  Filled 2016-06-08 (×2): qty 1

## 2016-06-08 MED ORDER — LACTATED RINGERS IV SOLN: INTRAVENOUS | Status: DC | PRN

## 2016-06-08 MED ORDER — WARFARIN SODIUM 5 MG PO TABS
7.5000 mg | ORAL_TABLET | Freq: Once | ORAL | Status: AC
  Administered 2016-06-08: 7.5 mg via ORAL
  Filled 2016-06-08: qty 1

## 2016-06-08 MED ORDER — OXYCODONE HCL 5 MG/5ML PO SOLN
5.0000 mg | Freq: Once | ORAL | Status: DC | PRN
  Filled 2016-06-08: qty 5

## 2016-06-08 MED ORDER — ACETAMINOPHEN 325 MG PO TABS: 325.0000 mg | ORAL_TABLET | ORAL | Status: DC | PRN

## 2016-06-08 MED ORDER — PROPOFOL 500 MG/50ML IV EMUL
INTRAVENOUS | Status: DC | PRN
  Administered 2016-06-08: 50 ug/kg/min via INTRAVENOUS

## 2016-06-08 MED ORDER — CHLORHEXIDINE GLUCONATE 4 % EX LIQD: 60.0000 mL | Freq: Once | CUTANEOUS | Status: DC

## 2016-06-08 MED ORDER — KETOROLAC TROMETHAMINE 15 MG/ML IJ SOLN
7.5000 mg | Freq: Four times a day (QID) | INTRAMUSCULAR | Status: AC
  Administered 2016-06-09 (×2): 7.5 mg via INTRAVENOUS
  Filled 2016-06-08 (×2): qty 1

## 2016-06-08 MED ORDER — PHENYLEPHRINE HCL 10 MG/ML IJ SOLN
INTRAMUSCULAR | Status: AC
  Filled 2016-06-08: qty 1

## 2016-06-08 MED ORDER — HYDROMORPHONE HCL 1 MG/ML IJ SOLN
INTRAMUSCULAR | Status: DC
Start: 2016-06-08 — End: 2016-06-08
  Filled 2016-06-08: qty 1

## 2016-06-08 MED ORDER — ACETAMINOPHEN 325 MG PO TABS: 650.0000 mg | ORAL_TABLET | Freq: Four times a day (QID) | ORAL | Status: DC | PRN

## 2016-06-08 MED ORDER — DEXAMETHASONE SODIUM PHOSPHATE 10 MG/ML IJ SOLN
INTRAMUSCULAR | Status: AC
  Filled 2016-06-08: qty 1

## 2016-06-08 MED ORDER — HYDROMORPHONE HCL 1 MG/ML IJ SOLN
0.2500 mg | INTRAMUSCULAR | Status: DC | PRN
  Administered 2016-06-08 (×2): 0.5 mg via INTRAVENOUS

## 2016-06-08 MED ORDER — OXYCODONE HCL 5 MG PO TABS
5.0000 mg | ORAL_TABLET | ORAL | Status: DC | PRN
  Administered 2016-06-09: 15 mg via ORAL
  Administered 2016-06-09: 10 mg via ORAL
  Administered 2016-06-09 (×2): 15 mg via ORAL
  Administered 2016-06-09: 10 mg via ORAL
  Administered 2016-06-10 (×2): 15 mg via ORAL
  Filled 2016-06-08 (×4): qty 3
  Filled 2016-06-08 (×2): qty 2
  Filled 2016-06-08: qty 3

## 2016-06-08 MED ORDER — ACETAMINOPHEN 160 MG/5ML PO SOLN: 325.0000 mg | ORAL | Status: DC | PRN

## 2016-06-08 MED ORDER — MENTHOL 3 MG MT LOZG: 1.0000 | LOZENGE | OROMUCOSAL | Status: DC | PRN

## 2016-06-08 MED ORDER — ONDANSETRON HCL 4 MG/2ML IJ SOLN
INTRAMUSCULAR | Status: AC
  Filled 2016-06-08: qty 2

## 2016-06-08 MED ORDER — FENTANYL CITRATE (PF) 100 MCG/2ML IJ SOLN
INTRAMUSCULAR | Status: DC | PRN
  Administered 2016-06-08: 100 ug via INTRAVENOUS

## 2016-06-08 MED ORDER — ACETAMINOPHEN 10 MG/ML IV SOLN
1000.0000 mg | Freq: Once | INTRAVENOUS | Status: AC
  Administered 2016-06-08: 1000 mg via INTRAVENOUS

## 2016-06-08 MED ORDER — WARFARIN - PHARMACIST DOSING INPATIENT
Freq: Every day | Status: DC
  Administered 2016-06-09: 18:00:00

## 2016-06-08 MED ORDER — METOPROLOL TARTRATE 25 MG PO TABS
25.0000 mg | ORAL_TABLET | Freq: Two times a day (BID) | ORAL | Status: DC
  Administered 2016-06-08 – 2016-06-10 (×3): 25 mg via ORAL
  Filled 2016-06-08 (×4): qty 1

## 2016-06-08 MED ORDER — ASPIRIN EC 81 MG PO TBEC
81.0000 mg | DELAYED_RELEASE_TABLET | Freq: Every day | ORAL | Status: DC
  Administered 2016-06-08 – 2016-06-10 (×3): 81 mg via ORAL
  Filled 2016-06-08 (×3): qty 1

## 2016-06-08 MED ORDER — BUPIVACAINE IN DEXTROSE 0.75-8.25 % IT SOLN
INTRATHECAL | Status: DC | PRN
  Administered 2016-06-08: 2 mL via INTRATHECAL

## 2016-06-08 MED ORDER — METHOCARBAMOL 500 MG PO TABS
500.0000 mg | ORAL_TABLET | Freq: Four times a day (QID) | ORAL | Status: DC | PRN
  Administered 2016-06-09 (×2): 500 mg via ORAL
  Filled 2016-06-08 (×2): qty 1

## 2016-06-08 MED ORDER — ONDANSETRON HCL 4 MG/2ML IJ SOLN
INTRAMUSCULAR | Status: DC | PRN
  Administered 2016-06-08: 4 mg via INTRAVENOUS

## 2016-06-08 MED ORDER — CEFAZOLIN SODIUM-DEXTROSE 2-4 GM/100ML-% IV SOLN
2.0000 g | INTRAVENOUS | Status: AC
  Administered 2016-06-08: 2 g via INTRAVENOUS

## 2016-06-08 MED ORDER — PROPOFOL 10 MG/ML IV BOLUS
INTRAVENOUS | Status: AC
  Filled 2016-06-08: qty 20

## 2016-06-08 MED ORDER — CITALOPRAM HYDROBROMIDE 20 MG PO TABS
20.0000 mg | ORAL_TABLET | Freq: Every evening | ORAL | Status: DC
  Administered 2016-06-08 – 2016-06-09 (×2): 20 mg via ORAL
  Filled 2016-06-08 (×2): qty 1

## 2016-06-08 MED ORDER — CEFAZOLIN SODIUM-DEXTROSE 2-4 GM/100ML-% IV SOLN
INTRAVENOUS | Status: AC
  Filled 2016-06-08: qty 100

## 2016-06-08 MED ORDER — DOXYCYCLINE HYCLATE 100 MG PO TABS
100.0000 mg | ORAL_TABLET | Freq: Two times a day (BID) | ORAL | Status: DC
  Administered 2016-06-08 – 2016-06-10 (×4): 100 mg via ORAL
  Filled 2016-06-08 (×6): qty 1

## 2016-06-08 MED ORDER — ROPIVACAINE HCL 2 MG/ML IJ SOLN
6.0000 mL/h | INTRAMUSCULAR | Status: DC
  Filled 2016-06-08 (×2): qty 200

## 2016-06-08 MED ORDER — ACETAMINOPHEN 650 MG RE SUPP: 650.0000 mg | Freq: Four times a day (QID) | RECTAL | Status: DC | PRN

## 2016-06-08 MED ORDER — DEXAMETHASONE SODIUM PHOSPHATE 10 MG/ML IJ SOLN
INTRAMUSCULAR | Status: DC | PRN
  Administered 2016-06-08: 10 mg via INTRAVENOUS

## 2016-06-08 MED ORDER — ACETAMINOPHEN 10 MG/ML IV SOLN
INTRAVENOUS | Status: AC
  Filled 2016-06-08: qty 100

## 2016-06-08 MED ORDER — 0.9 % SODIUM CHLORIDE (POUR BTL) OPTIME
TOPICAL | Status: DC | PRN
  Administered 2016-06-08: 1000 mL

## 2016-06-08 MED ORDER — ALUM & MAG HYDROXIDE-SIMETH 200-200-20 MG/5ML PO SUSP: 30.0000 mL | ORAL | Status: DC | PRN

## 2016-06-08 MED ORDER — CEFAZOLIN SODIUM 1-5 GM-% IV SOLN
1.0000 g | Freq: Four times a day (QID) | INTRAVENOUS | Status: AC
  Administered 2016-06-08 – 2016-06-09 (×2): 1 g via INTRAVENOUS
  Filled 2016-06-08 (×2): qty 50

## 2016-06-08 MED ORDER — DEXTROSE 5 % IV SOLN
10.0000 mg | INTRAVENOUS | Status: DC | PRN
  Administered 2016-06-08: 20 ug/min via INTRAVENOUS

## 2016-06-08 MED ORDER — LACTATED RINGERS IV SOLN
INTRAVENOUS | Status: DC
  Administered 2016-06-08: 1000 mL via INTRAVENOUS
  Administered 2016-06-08 (×2): via INTRAVENOUS

## 2016-06-08 MED ORDER — MIDAZOLAM HCL 2 MG/2ML IJ SOLN
INTRAMUSCULAR | Status: AC
  Filled 2016-06-08: qty 2

## 2016-06-08 MED ORDER — METHOCARBAMOL 1000 MG/10ML IJ SOLN
500.0000 mg | Freq: Four times a day (QID) | INTRAVENOUS | Status: DC | PRN
  Filled 2016-06-08: qty 5

## 2016-06-08 MED ORDER — HYDROMORPHONE HCL 1 MG/ML IJ SOLN
1.0000 mg | INTRAMUSCULAR | Status: DC | PRN
  Administered 2016-06-08: 1 mg via INTRAVENOUS
  Filled 2016-06-08: qty 1

## 2016-06-08 MED ORDER — DOCUSATE SODIUM 100 MG PO CAPS
100.0000 mg | ORAL_CAPSULE | Freq: Two times a day (BID) | ORAL | Status: DC
  Administered 2016-06-08 – 2016-06-10 (×4): 100 mg via ORAL
  Filled 2016-06-08 (×4): qty 1

## 2016-06-08 MED ORDER — FENTANYL CITRATE (PF) 100 MCG/2ML IJ SOLN
INTRAMUSCULAR | Status: AC
  Filled 2016-06-08: qty 2

## 2016-06-08 MED ORDER — OXYCODONE HCL ER 20 MG PO T12A
20.0000 mg | EXTENDED_RELEASE_TABLET | Freq: Two times a day (BID) | ORAL | Status: DC
  Administered 2016-06-08 – 2016-06-10 (×4): 20 mg via ORAL
  Filled 2016-06-08 (×5): qty 1

## 2016-06-08 MED ORDER — OXYCODONE HCL 5 MG PO TABS
5.0000 mg | ORAL_TABLET | Freq: Once | ORAL | Status: DC | PRN
Start: 2016-06-08 — End: 2016-06-08

## 2016-06-08 MED ORDER — SODIUM CHLORIDE 0.9 % IV SOLN
INTRAVENOUS | Status: DC
Start: 2016-06-08 — End: 2016-06-10
  Administered 2016-06-09: 06:00:00 via INTRAVENOUS

## 2016-06-08 MED ORDER — ONDANSETRON HCL 4 MG PO TABS: 4.0000 mg | ORAL_TABLET | Freq: Four times a day (QID) | ORAL | Status: DC | PRN

## 2016-06-08 MED ORDER — PROPOFOL 10 MG/ML IV BOLUS
INTRAVENOUS | Status: DC | PRN
  Administered 2016-06-08 (×2): 40 mg via INTRAVENOUS
  Administered 2016-06-08: 20 mg via INTRAVENOUS

## 2016-06-08 MED ORDER — MIDAZOLAM HCL 5 MG/5ML IJ SOLN
INTRAMUSCULAR | Status: DC | PRN
  Administered 2016-06-08: 2 mg via INTRAVENOUS

## 2016-06-08 MED ORDER — SODIUM CHLORIDE 0.9 % IR SOLN
Status: DC | PRN
  Administered 2016-06-08: 1000 mL

## 2016-06-08 MED ORDER — PROPOFOL 10 MG/ML IV BOLUS
INTRAVENOUS | Status: AC
  Filled 2016-06-08: qty 80

## 2016-06-08 MED ORDER — PHENOL 1.4 % MT LIQD
1.0000 | OROMUCOSAL | Status: DC | PRN
Start: 2016-06-08 — End: 2016-06-10

## 2016-06-08 MED ORDER — METOCLOPRAMIDE HCL 5 MG/ML IJ SOLN
5.0000 mg | Freq: Three times a day (TID) | INTRAMUSCULAR | Status: DC | PRN
Start: 2016-06-08 — End: 2016-06-10

## 2016-06-08 SURGICAL SUPPLY — 46 items
BAG ZIPLOCK 12X15 (MISCELLANEOUS) IMPLANT
BENZOIN TINCTURE PRP APPL 2/3 (GAUZE/BANDAGES/DRESSINGS) ×4 IMPLANT
BLADE SAW SGTL 18X1.27X75 (BLADE) ×3 IMPLANT
BLADE SAW SGTL 18X1.27X75MM (BLADE) ×1
BLADE SURG SZ10 CARB STEEL (BLADE) ×4 IMPLANT
CAPT HIP TOTAL 2 ×4 IMPLANT
CELLS DAT CNTRL 66122 CELL SVR (MISCELLANEOUS) ×2 IMPLANT
CLOSURE WOUND 1/2 X4 (GAUZE/BANDAGES/DRESSINGS) ×1
CLOTH BEACON ORANGE TIMEOUT ST (SAFETY) ×4 IMPLANT
COVER PERINEAL POST (MISCELLANEOUS) ×4 IMPLANT
DRAPE C-ARM 42X120 X-RAY (DRAPES) ×4 IMPLANT
DRAPE STERI IOBAN 125X83 (DRAPES) ×4 IMPLANT
DRAPE U-SHAPE 47X51 STRL (DRAPES) ×8 IMPLANT
DRSG AQUACEL AG ADV 3.5X10 (GAUZE/BANDAGES/DRESSINGS) ×4 IMPLANT
DURAPREP 26ML APPLICATOR (WOUND CARE) ×4 IMPLANT
ELECT BLADE TIP CTD 4 INCH (ELECTRODE) ×4 IMPLANT
ELECT PENCIL ROCKER SW 15FT (MISCELLANEOUS) IMPLANT
ELECT REM PT RETURN 9FT ADLT (ELECTROSURGICAL) ×4
ELECTRODE REM PT RTRN 9FT ADLT (ELECTROSURGICAL) ×2 IMPLANT
EVACUATOR 1/8 PVC DRAIN (DRAIN) IMPLANT
FACESHIELD WRAPAROUND (MASK) ×20 IMPLANT
GLOVE BIO SURGEON STRL SZ7.5 (GLOVE) ×4 IMPLANT
GLOVE BIOGEL PI IND STRL 8 (GLOVE) ×4 IMPLANT
GLOVE BIOGEL PI INDICATOR 8 (GLOVE) ×4
GLOVE ECLIPSE 8.0 STRL XLNG CF (GLOVE) ×4 IMPLANT
GOWN STRL REUS W/TWL XL LVL3 (GOWN DISPOSABLE) ×8 IMPLANT
HANDPIECE INTERPULSE COAX TIP (DISPOSABLE) ×2
LIQUID BAND (GAUZE/BANDAGES/DRESSINGS) IMPLANT
MARKER SKIN DUAL TIP RULER LAB (MISCELLANEOUS) IMPLANT
PACK ANTERIOR HIP CUSTOM (KITS) ×4 IMPLANT
PACK UNIVERSAL I (CUSTOM PROCEDURE TRAY) IMPLANT
RTRCTR WOUND ALEXIS 18CM MED (MISCELLANEOUS) ×4
SET HNDPC FAN SPRY TIP SCT (DISPOSABLE) ×2 IMPLANT
SPONGE LAP 18X18 X RAY DECT (DISPOSABLE) IMPLANT
STAPLER VISISTAT 35W (STAPLE) IMPLANT
STRIP CLOSURE SKIN 1/2X4 (GAUZE/BANDAGES/DRESSINGS) ×3 IMPLANT
SUT ETHIBOND NAB CT1 #1 30IN (SUTURE) ×4 IMPLANT
SUT MNCRL AB 4-0 PS2 18 (SUTURE) ×4 IMPLANT
SUT VIC AB 0 CT1 36 (SUTURE) ×8 IMPLANT
SUT VIC AB 1 CT1 36 (SUTURE) ×4 IMPLANT
SUT VIC AB 2-0 CT1 27 (SUTURE) ×4
SUT VIC AB 2-0 CT1 TAPERPNT 27 (SUTURE) ×4 IMPLANT
TOWEL OR 17X26 10 PK STRL BLUE (TOWEL DISPOSABLE) ×4 IMPLANT
TRAY FOLEY W/METER SILVER 14FR (SET/KITS/TRAYS/PACK) IMPLANT
TRAY FOLEY W/METER SILVER 16FR (SET/KITS/TRAYS/PACK) ×4 IMPLANT
YANKAUER SUCT BULB TIP 10FT TU (MISCELLANEOUS) ×4 IMPLANT

## 2016-06-08 NOTE — Anesthesia Procedure Notes (Signed)
Spinal Patient location during procedure: OR Staffing Anesthesiologist: Faisal Stradling Preanesthetic Checklist Completed: patient identified, surgical consent, pre-op evaluation, timeout performed, IV checked, risks and benefits discussed and monitors and equipment checked Spinal Block Patient position: sitting Prep: site prepped and draped and DuraPrep Patient monitoring: heart rate, cardiac monitor, continuous pulse ox and blood pressure Approach: midline Location: L3-4 Injection technique: single-shot Needle Needle type: Sprotte  Needle gauge: 24 G Needle length: 10 cm Assessment Sensory level: T6

## 2016-06-08 NOTE — Brief Op Note (Signed)
06/08/2016  4:45 PM  PATIENT:  Steve Vang  50 y.o. male  PRE-OPERATIVE DIAGNOSIS:  Avascular necrosis bilateral hips  POST-OPERATIVE DIAGNOSIS:  avascular necrosis left hip  PROCEDURE:  Procedure(s): TOTAL HIP ARTHROPLASTY ANTERIOR APPROACH (Left)  SURGEON:  Surgeon(s) and Role:    * Kathryne Hitch, MD - Primary  PHYSICIAN ASSISTANT: Rexene Edison, PA-C  ANESTHESIA:   spinal  EBL:  Total I/O In: 2000 [I.V.:2000] Out: 650 [Urine:500; Blood:150]  COUNTS:  YES  TOURNIQUET:  * No tourniquets in log *  DICTATION: .Other Dictation: Dictation Number [id number]  PLAN OF CARE: Admit to inpatient   PATIENT DISPOSITION:  PACU - hemodynamically stable.   Delay start of Pharmacological VTE agent (>24hrs) due to surgical blood loss or risk of bleeding: no

## 2016-06-08 NOTE — Progress Notes (Addendum)
ANTICOAGULATION CONSULT NOTE - Initial Consult  Pharmacy Consult for Warfarin Indication: Hx mural thrombus on left ventricle  Allergies  Allergen Reactions  . Benadryl [Diphenhydramine] Hives and Swelling  . Chantix [Varenicline] Nausea And Vomiting    Patient Measurements:   Heparin Dosing Weight:   Vital Signs: Temp: 97.7 F (36.5 C) (06/09 1751) Temp Source: Oral (06/09 1200) BP: 109/71 mmHg (06/09 1751) Pulse Rate: 61 (06/09 1743)  Labs:  Recent Labs  06/08/16 1225 06/08/16 1226  APTT  --  26  LABPROT 12.3  --   INR 0.89  --     Estimated Creatinine Clearance: 58.6 mL/min (by C-G formula based on Cr of 1.41).   Medical History: Past Medical History  Diagnosis Date  . CAD (coronary artery disease)     Multivessel, LVEF 45-50%  . History of stroke 2004  . Hyperlipidemia   . Essential hypertension   . Left ventricular mural thrombus (HCC)     On Coumadin  . Depression   . Falls   . CKD (chronic kidney disease) stage 2, GFR 60-89 ml/min   . Gallstone   . Cardiomyopathy (HCC)     LVEF 50-55% December 2016  . Gastroenteritis   . 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  Pt stopped warfarin on 6/2 and has been bridged with lovenox (LD 6/8 AM).  Pharmacy consulted to restart warfarin post-op.  ASA 81mg  daily resumed POD0.    6/9: Baseline PT/INR WNL.  SCDs ordered.  Noted pt on doxycycline for possible tick bite.  Hypoprothrombinemic effects of warfarin may be increased by doxycycline, possibly with bleeding, therefore will start pt back at home dose rather than boosted initial dosing.    Goal of Therapy:  INR 2-3 Monitor platelets by anticoagulation protocol: Yes   Plan:  Warfarin 7.5  mg po x 1 tonight.   Daily PT/INR  Monitor closely with DDI with doxycycline  Haynes Hoehn, PharmD, BCPS 06/08/2016, 8:04 PM  Pager: [phone]

## 2016-06-08 NOTE — H&P (Signed)
TOTAL HIP ADMISSION H&P  Patient is admitted for bilaterally total hip arthroplasty.  Subjective:  Chief Complaint: bilaterally hip pain  HPI: Steve Vang, 50 y.o. male, has a history of pain and functional disability in the bilaterally hip(s) due to idiopathic avascular necrosis and patient has failed non-surgical conservative treatments for greater than 12 weeks to include NSAID's and/or analgesics, flexibility and strengthening excercises, use of assistive devices and activity modification.  Onset of symptoms was gradual starting 3 years ago with rapidlly worsening course since that time.The patient noted no past surgery on the bilaterally hip(s).  Patient currently rates pain in the bilaterally hip at 10 out of 10 with activity. Patient has night pain, worsening of pain with activity and weight bearing, pain that interfers with activities of daily living and pain with passive range of motion. Patient has evidence of subchondral cysts by imaging studies. This condition presents safety issues increasing the risk of falls.  There is no current active infection.  Patient Active Problem List   Diagnosis Date Noted  . Avascular necrosis of bones of both hips (HCC) 06/08/2016  . AKI (acute kidney injury) (HCC) 05/15/2016  . Nausea and vomiting 05/15/2016  . Abdominal pain 05/15/2016  . Chest pain 05/15/2016  . Emesis   . Encounter for therapeutic drug monitoring 03/03/2014  . Long term (current) use of anticoagulants 04/16/2011  . GAIT IMBALANCE 02/02/2011  . ABNORMAL CV (STRESS) TEST 10/02/2010  . Mural thrombus of left ventricle (HCC) 09/08/2010  . OTHER SPEC FORMS CHRONIC ISCHEMIC HEART DISEASE 06/07/2010  . Mixed hyperlipidemia 03/02/2010  . TOBACCO ABUSE 03/02/2010  . Essential hypertension, benign 03/02/2010  . CORONARY ATHEROSCLEROSIS NATIVE CORONARY ARTERY 03/02/2010  . CVA 03/01/2010   Past Medical History  Diagnosis Date  . CAD (coronary artery disease)     Multivessel, LVEF  45-50%  . History of stroke 2004  . Hyperlipidemia   . Essential hypertension   . Left ventricular mural thrombus (HCC)     On Coumadin  . Depression   . Falls   . CKD (chronic kidney disease) stage 2, GFR 60-89 ml/min   . Gallstone   . Cardiomyopathy (HCC)     LVEF 50-55% December 2016  . Gastroenteritis   . COPD (chronic obstructive pulmonary disease) [facility])     Past Surgical History  Procedure Laterality Date  . Coronary artery bypass graft      DOR anterior ventricular restoration surgery 8/06, [facility] [place] to first diagonal, SVG to PLB, SVG to RVE branch of nondominant RCA  . Eye surgery      Prescriptions prior to admission  Medication Sig Dispense Refill Last Dose  . aspirin EC 81 MG tablet Take 81 mg by mouth daily.   06/07/2016 at 0800  . enoxaparin (LOVENOX) 120 MG/0.8ML injection Inject 0.8 mLs (120 mg total) into the skin daily. 10 Syringe 1 06/07/2016 at 0800  . lisinopril (PRINIVIL,ZESTRIL) 20 MG tablet TAKE ONE TABLET BY MOUTH ONCE DAILY 90 tablet 3 06/07/2016 at 0800  . metoprolol tartrate (LOPRESSOR) 25 MG tablet Take 1 tablet (25 mg total) by mouth 2 (two) times daily. 60 tablet 3 06/08/2016 at 0800  . nitroGLYCERIN (NITROSTAT) 0.4 MG SL tablet Place 1 tablet (0.4 mg total) under the tongue every 5 (five) minutes as needed. (Patient taking differently: Place 0.4 mg under the tongue every 5 (five) minutes as needed for chest pain. ) 25 tablet 3 05/15/2016  . oxyCODONE-acetaminophen (PERCOCET) 10-325 MG tablet Take 1 tablet  by mouth every 4 (four) hours as needed for pain.   06/04/2016  . simvastatin (ZOCOR) 20 MG tablet Take 20 mg by mouth daily.   06/03/2016  . warfarin (COUMADIN) 5 MG tablet Take 1 1/2 tablets daily except 1 tablet on Sundays and Thursdays (Patient taking differently: Take 5-7.5 mg by mouth daily after supper. Take one and a half tablets (7.5mg ) daily except on Sunday and Thursday. Take one tablet ( ) on those two days.) 45 tablet 3 06/01/2016  .  citalopram (CELEXA) 20 MG tablet Take 20 mg by mouth every evening. Reported on 05/15/2016   More than a month at Unknown time   Allergies  Allergen Reactions  . Benadryl [Diphenhydramine] Hives and Swelling  . Chantix [Varenicline] Nausea And Vomiting    Social History  Substance Use Topics  . Smoking status: Current Every Day Smoker -- 0.25 packs/day for 30 years    Types: Cigarettes  . Smokeless tobacco: Never Used  . Alcohol Use: No     Comment: hx of alcohol use     Family History  Problem Relation Age of Onset  . Hypertension Mother   . Diabetes Mother      Review of Systems  Musculoskeletal: Positive for joint pain.  All other systems reviewed and are negative.   Objective:  Physical Exam  Constitutional: He is oriented to person, place, and time. He appears well-developed and well-nourished.  HENT:  Head: Normocephalic and atraumatic.  Eyes: EOM are normal. Pupils are equal, round, and reactive to light.  Neck: Normal range of motion. Neck supple.  Cardiovascular: Normal rate.   Respiratory: Effort normal and breath sounds normal.  GI: Soft. Bowel sounds are normal.  Musculoskeletal:       Right hip: He exhibits decreased range of motion, decreased strength, tenderness and bony tenderness.       Left hip: He exhibits decreased range of motion, decreased strength, tenderness and bony tenderness.  Neurological: He is alert and oriented to person, place, and time.  Skin: Skin is warm and dry.  Psychiatric: He has a normal mood and affect.    Vital signs in last 24 hours:    Labs:   Estimated body mass index is 24.11 kg/(m^2) as calculated from the following:   Height as of 05/15/16:  (1.702 m).   Weight as of 05/24/16: 69.854 kg (154 lb).   Imaging Review Plain radiographs demonstrate severe avascular necrosis of the bilateral hip(s). The bone quality appears to be good for age and reported activity level.  Assessment/Plan:  End stage idiopathic  avascular necrosis, bilaterally hip(s)  The patient history, physical examination, clinical judgement of the provider and imaging studies are consistent with end stage AVN of the bilaterally hip(s) and total hip arthroplasty is deemed medically necessary. The treatment options including medical management, injection therapy, arthroscopy and arthroplasty were discussed at length. The risks and benefits of total hip arthroplasty were presented and reviewed. The risks due to aseptic loosening, infection, stiffness, dislocation/subluxation,  thromboembolic complications and other imponderables were discussed.  The patient acknowledged the explanation, agreed to proceed with the plan and consent was signed. Patient is being admitted for inpatient treatment for surgery, pain control, PT, OT, prophylactic antibiotics, VTE prophylaxis, progressive ambulation and ADL's and discharge planning.The patient is planning to be discharged home with home health services

## 2016-06-08 NOTE — Anesthesia Preprocedure Evaluation (Addendum)
Anesthesia Evaluation  Patient identified by MRN, date of birth, ID band Patient awake    Reviewed: Allergy & Precautions, NPO status , Patient's Chart, lab work & pertinent test results  History of Anesthesia Complications Negative for: history of anesthetic complications  Airway Mallampati: II  TM Distance: >3 FB Neck ROM: Full    Dental  (+) Edentulous Upper, Edentulous Lower   Pulmonary neg shortness of breath, neg sleep apnea, COPD, neg recent URI, Current Smoker,    breath sounds clear to auscultation       Cardiovascular hypertension, Pt. on medications and Pt. on home beta blockers (-) angina+ CAD, + CABG and + Peripheral Vascular Disease   Rhythm:Regular     Neuro/Psych PSYCHIATRIC DISORDERS Depression CVA, No Residual Symptoms    GI/Hepatic negative GI ROS, Neg liver ROS,   Endo/Other    Renal/GU Renal InsufficiencyRenal disease     Musculoskeletal   Abdominal   Peds  Hematology negative hematology ROS (+)   Anesthesia Other Findings   Reproductive/Obstetrics                            Anesthesia Physical Anesthesia Plan  ASA: III  Anesthesia Plan: MAC and Spinal   Post-op Pain Management:    Induction: Intravenous  Airway Management Planned: Natural Airway, Nasal Cannula and Simple Face Mask  Additional Equipment: None  Intra-op Plan:   Post-operative Plan:   Informed Consent: I have reviewed the patients History and Physical, chart, labs and discussed the procedure including the risks, benefits and alternatives for the proposed anesthesia with the patient or authorized representative who has indicated his/her understanding and acceptance.   Dental advisory given  Plan Discussed with: CRNA and Surgeon  Anesthesia Plan Comments:        Anesthesia Quick Evaluation

## 2016-06-08 NOTE — Progress Notes (Signed)
Pt has red bump about a nickel size to rt anterior upper leg.  Pt states he didn't even realize the area was there.  Pt states it's possible its a mosquito bite, I stay outside a lot.  Holly in the OR was informed of this.  Note on front of chart also.

## 2016-06-08 NOTE — Transfer of Care (Signed)
Immediate Anesthesia Transfer of Care Note  Patient: Steve Vang  Procedure(s) Performed: Procedure(s): TOTAL HIP ARTHROPLASTY ANTERIOR APPROACH (Left)  Patient Location: PACU  Anesthesia Type:Spinal  Level of Consciousness: awake, pateint uncooperative, confused, lethargic and responds to stimulation  Airway & Oxygen Therapy: Patient Spontanous Breathing and Patient connected to face mask oxygen  Post-op Assessment: Report given to RN and Post -op Vital signs reviewed and stable  Post vital signs: Reviewed and stable  Last Vitals:  Filed Vitals:   06/08/16 1200  BP: 174/93  Pulse: 66  Temp: 36.5 C  Resp: 18    Last Pain: There were no vitals filed for this visit.    Patients Stated Pain Goal: 4 (06/08/16 1344)  Complications: No apparent anesthesia complications

## 2016-06-08 NOTE — Progress Notes (Signed)
Utilization review completed in [employer].  L. J. Heidi Maclin RN, BSN, CM 

## 2016-06-08 NOTE — Progress Notes (Signed)
Pt hitting at staff.  Md at bedside.  Order received to give versed .5 - 1 mg.

## 2016-06-08 NOTE — Progress Notes (Signed)
Dr. Moser at bedside.

## 2016-06-09 LAB — CBC
HEMATOCRIT: 28 % — AB (ref 39.0–52.0)
HEMOGLOBIN: 9.5 g/dL — AB (ref 13.0–17.0)
MCH: 29.8 pg (ref 26.0–34.0)
MCHC: 33.9 g/dL (ref 30.0–36.0)
MCV: 87.8 fL (ref 78.0–100.0)
Platelets: 217 10*3/uL (ref 150–400)
RBC: 3.19 MIL/uL — ABNORMAL LOW (ref 4.22–5.81)
RDW: 14.5 % (ref 11.5–15.5)
WBC: 13.2 10*3/uL — ABNORMAL HIGH (ref 4.0–10.5)

## 2016-06-09 LAB — BASIC METABOLIC PANEL
Anion gap: 6 (ref 5–15)
BUN: 15 mg/dL (ref 6–20)
CHLORIDE: 103 mmol/L (ref 101–111)
CO2: 25 mmol/L (ref 22–32)
CREATININE: 1.26 mg/dL — AB (ref 0.61–1.24)
Calcium: 8.6 mg/dL — ABNORMAL LOW (ref 8.9–10.3)
GFR calc Af Amer: >60 mL/min (ref >60)
GFR calc non Af Amer: >60 mL/min (ref >60)
Glucose, Bld: 313 mg/dL — ABNORMAL HIGH (ref 65–99)
Potassium: 4.1 mmol/L (ref 3.5–5.1)
Sodium: 134 mmol/L — ABNORMAL LOW (ref 135–145)

## 2016-06-09 LAB — PROTIME-INR
INR: 1 (ref 0.00–1.49)
Prothrombin Time: 13.4 seconds (ref 11.6–15.2)

## 2016-06-09 MED ORDER — ENOXAPARIN SODIUM 120 MG/0.8ML ~~LOC~~ SOLN
120.0000 mg | SUBCUTANEOUS | Status: DC
  Administered 2016-06-09: 120 mg via SUBCUTANEOUS
  Filled 2016-06-09 (×2): qty 0.8

## 2016-06-09 MED ORDER — WARFARIN SODIUM 5 MG PO TABS
7.5000 mg | ORAL_TABLET | Freq: Once | ORAL | Status: AC
  Administered 2016-06-09: 7.5 mg via ORAL
  Filled 2016-06-09: qty 1

## 2016-06-09 NOTE — Evaluation (Signed)
Physical Therapy Evaluation Patient Details Name: Steve Vang MRN: [id number] DOB: 12/06/1966 Today's Date: 06/09/2016   History of Present Illness  Pt s/p L THR and with hx of CAD, CVA, COPD, CABG and cardiomyopathy  Clinical Impression  Pt s/p L THR presents with decreased L LE strength/ROM and post op pain limiting functional mobility.  Pt should progress to dc home with family assist and HHPT follow up.    Follow Up Recommendations Home health PT    Equipment Recommendations  None recommended by PT    Recommendations for Other Services OT consult     Precautions / Restrictions Precautions Precautions: Fall Restrictions Weight Bearing Restrictions: No Other Position/Activity Restrictions: WBAT      Mobility  Bed Mobility               General bed mobility comments: NT - Pt OOB and declines back to bed  Transfers Overall transfer level: Needs assistance Equipment used: Rolling walker (2 wheeled) Transfers: Sit to/from Stand Sit to Stand: Min guard         General transfer comment: min cues for use of UEs and saftey wtih transition position  Ambulation/Gait Ambulation/Gait assistance: Min guard Ambulation Distance (Feet): 180 Feet Assistive device: Crutches Gait Pattern/deviations: Step-to pattern;Step-through pattern;Shuffle     General Gait Details: Cues to slow pace.  Pt demonstrates good skills and stability with use of crutches  Stairs            Wheelchair Mobility    Modified Rankin (Stroke Patients Only)       Balance                                             Pertinent Vitals/Pain Pain Assessment: 0-10 Pain Score: 3  Pain Location: L hip Pain Descriptors / Indicators: Aching;Sore Pain Intervention(s): Limited activity within patient's tolerance;Monitored during session;Premedicated before session;Ice applied    Home Living Family/patient expects to be discharged to:: Private residence Living  Arrangements: Spouse/significant other Available Help at Discharge: Family Type of Home: House Home Access: Stairs to enter Entrance Stairs-Rails: Right;Left;Can reach both Secretary/administrator of Steps: 3 Home Layout: One level Home Equipment: Walker - 2 wheels;Crutches      Prior Function Level of Independence: Independent;Independent with assistive device(s)         Comments: Utilized crutches as needed     Hand Dominance        Extremity/Trunk Assessment   Upper Extremity Assessment: Overall WFL for tasks assessed           Lower Extremity Assessment: LLE deficits/detail   LLE Deficits / Details: Strength at hip 3/5 with AAROM at hip to 90 flex and 15 abd  Cervical / Trunk Assessment: Normal  Communication   Communication: No difficulties  Cognition Arousal/Alertness: Awake/alert Behavior During Therapy: WFL for tasks assessed/performed Overall Cognitive Status: Within Functional Limits for tasks assessed                      General Comments      Exercises Total Joint Exercises Ankle Circles/Pumps: AROM;Both;15 reps;Supine Quad Sets: AROM;Both;10 reps;Supine Heel Slides: AAROM;Left;20 reps;Supine Hip ABduction/ADduction: AAROM;Left;15 reps;Supine      Assessment/Plan    PT Assessment Patient needs continued PT services  PT Diagnosis Difficulty walking   PT Problem List Decreased strength;Decreased range of motion;Decreased activity tolerance;Decreased mobility;Pain;Decreased knowledge of  use of DME;Decreased safety awareness  PT Treatment Interventions DME instruction;Gait training;Stair training;Functional mobility training;Therapeutic activities;Therapeutic exercise;Patient/family education   PT Goals (Current goals can be found in the Care Plan section) Acute Rehab PT Goals Patient Stated Goal: Return for opposite hip in one month PT Goal Formulation: With patient Time For Goal Achievement: 06/11/16 Potential to Achieve Goals:  Good    Frequency 7X/week   Barriers to discharge        Co-evaluation               End of Session Equipment Utilized During Treatment: Gait belt Activity Tolerance: Patient tolerated treatment well Patient left: in chair;with call bell/phone within reach;with chair alarm set;with family/visitor present Nurse Communication: Mobility status         Time: 8101-7510 PT Time Calculation (min) (ACUTE ONLY): 23 min   Charges:   PT Evaluation $PT Eval Low Complexity: 1 Procedure PT Treatments $Therapeutic Exercise: 8-22 mins   PT G Codes:        Beverley Sherrard Jun 30, 2016, 12:38 PM

## 2016-06-09 NOTE — Care Management Note (Addendum)
Case Management Note  Patient Details  Name: Steve Vang MRN: [id number] Date of Birth: 01-29-1966  Subjective/Objective:          Left Total Hip Arthroplasty          Action/Plan: Discharge Planning: AVS reviewed: NCM spoke to pt and wife, [name] at bedside. Pt has walker, crutches, and wheelchair at home. Offered choice for HH/provided [facility] list. Wife agreeable to [place] for [facility].   PCP- Oval Linsey MD Expected Discharge Date:  06/09/2016              Expected Discharge Plan:  Home w Home Health Services  In-House Referral:  NA  Discharge planning Services  CM Consult  Post Acute Care Choice:  Home Health Choice offered to:  Patient, Spouse  DME Arranged:  N/A DME Agency:  NA  HH Arranged:  PT HH Agency:  [facility]  Status of Service:  Completed, signed off  Medicare Important Message Given:    Date Medicare IM Given:    Medicare IM give by:    Date Additional Medicare IM Given:    Additional Medicare Important Message give by:     If discussed at Long Length of Stay Meetings, dates discussed:    Additional Comments:  Elliot Cousin, RN 06/09/2016, 12:10 PM

## 2016-06-09 NOTE — Progress Notes (Signed)
Physical Therapy Treatment Patient Details Name: Steve Vang MRN: [id number] DOB: [birth year] Today's Date: 06/09/2016    History of Present Illness Pt s/p L THR and with hx of CAD, CVA, COPD, CABG and cardiomyopathy    PT Comments    POD # 1 pm session Assisted with amb in hallway with B crutches.  Practiced stairs then performed THR TE's following HEP.    Follow Up Recommendations  Home health PT     Equipment Recommendations  None recommended by PT    Recommendations for Other Services       Precautions / Restrictions Precautions Precautions: Fall Restrictions Weight Bearing Restrictions: No Other Position/Activity Restrictions: WBAT    Mobility  Bed Mobility               General bed mobility comments: OOB in recliner  Transfers Overall transfer level: Needs assistance Equipment used: Rolling walker (2 wheeled) Transfers: Sit to/from Stand Sit to Stand: Min guard         General transfer comment: min cues for use of UEs and saftey wtih transition position.  Pt impulsive  Ambulation/Gait Ambulation/Gait assistance: Min guard Ambulation Distance (Feet): 145 Feet Assistive device: Crutches Gait Pattern/deviations: Step-to pattern;Step-through pattern     General Gait Details: Cues to slow pace.  Pt demonstrates good skills and stability with use of crutches    Impulsive   Stairs Stairs: Yes Stairs assistance: Min guard Stair Management: No rails;Step to pattern;Forwards;With crutches Number of Stairs: 2 General stair comments: VC's on safety and proper sequencing  Wheelchair Mobility    Modified Rankin (Stroke Patients Only)       Balance                                    Cognition Arousal/Alertness: Awake/alert Behavior During Therapy: WFL for tasks assessed/performed Overall Cognitive Status: Within Functional Limits for tasks assessed                      Exercises      General Comments         Pertinent Vitals/Pain Pain Assessment: 0-10 Pain Score: 3  Pain Location: L thigh Pain Descriptors / Indicators: Aching;Discomfort;Grimacing;Sore;Tightness Pain Intervention(s): Monitored during session;Premedicated before session;Repositioned;Ice applied    Home Living Family/patient expects to be discharged to:: Private residence Living Arrangements: Spouse/significant other Available Help at Discharge: Family Type of Home: House Home Access: Stairs to enter Entrance Stairs-Rails: Right;Left;Can reach both Home Layout: One level Home Equipment: Dan Humphreys - 2 wheels;Crutches      Prior Function Level of Independence: Independent;Independent with assistive device(s)      Comments: Utilized crutches as needed   PT Goals (current goals can now be found in the care plan section) Progress towards PT goals: Progressing toward goals    Frequency  7X/week    PT Plan Current plan remains appropriate    Co-evaluation             End of Session Equipment Utilized During Treatment: Gait belt Activity Tolerance: Patient tolerated treatment well Patient left: in chair;with call bell/phone within reach;with chair alarm set;with family/visitor present     Time: 1350-1411 PT Time Calculation (min) (ACUTE ONLY): 21 min  Charges:  $Gait Training: 8-22 mins                    G Codes:      Felecia Shelling  PTA [facility]  Acute  Rehab Pager      [phone]

## 2016-06-09 NOTE — Progress Notes (Signed)
Subjective: 1 Day Post-Op Procedure(s) (LRB): TOTAL HIP ARTHROPLASTY ANTERIOR APPROACH (Left) Patient reports pain as moderate.    Objective: Vital signs in last 24 hours: Temp:  [97 F (36.1 C)-98.6 F (37 C)] 97.7 F (36.5 C) (06/10 1001) Pulse Rate:  [52-73] 67 (06/10 1001) Resp:  [12-21] 15 (06/10 1001) BP: (97-174)/(54-93) 98/65 mmHg (06/10 1001) SpO2:  [92 %-100 %] 99 % (06/10 1001) Weight:  [69.854 kg (154 lb)] 69.854 kg (154 lb) (06/09 1857)  Intake/Output from previous day: 06/09 0701 - 06/10 0700 In: 2920 [P.O.:720; I.V.:2200] Out: 1800 [Urine:1650; Blood:150] Intake/Output this shift: Total I/O In: 360 [P.O.:360] Out: -    Recent Labs  06/09/16 0357  HGB 9.5*    Recent Labs  06/09/16 0357  WBC 13.2*  RBC 3.19*  HCT 28.0*  PLT 217    Recent Labs  06/09/16 0357  NA 134*  K 4.1  CL 103  CO2 25  BUN 15  CREATININE 1.26*  GLUCOSE 313*  CALCIUM 8.6*    Recent Labs  06/08/16 1225 06/09/16 0357  INR 0.89 1.00    Sensation intact distally Intact pulses distally Dorsiflexion/Plantar flexion intact Incision: dressing C/D/I No cellulitis present Compartment soft  Assessment/Plan: 1 Day Post-Op Procedure(s) (LRB): TOTAL HIP ARTHROPLASTY ANTERIOR APPROACH (Left) Up with therapy Plan for discharge tomorrow Discharge home with home health  Kathryne Hitch 06/09/2016, 10:07 AM

## 2016-06-09 NOTE — Progress Notes (Signed)
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 Benadryl [Diphenhydramine] Hives and Swelling  . Chantix [Varenicline] Nausea And Vomiting    Patient Measurements: Height: 5\' 7"  (170.2 cm) Weight: 161 lb 11.2 oz (73.347 kg) IBW/kg (Calculated) : 66.1  Vital Signs: Temp: 97.7 F (36.5 C) (06/10 1001) Temp Source: Oral (06/10 1001) BP: 98/65 mmHg (06/10 1001) Pulse Rate: 67 (06/10 1001)  Labs:  Recent Labs  06/08/16 1225 06/08/16 1226 06/09/16 0357  HGB  --   --  9.5*  HCT  --   --  28.0*  PLT  --   --  217  APTT  --  26  --   LABPROT 12.3  --  13.4  INR 0.89  --  1.00  CREATININE  --   --  1.26*    Estimated Creatinine Clearance: 65.6 mL/min (by C-G formula based on Cr of 1.26).   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  Pt stopped warfarin on 6/2 and has been bridged with lovenox (LD 6/8 AM).  Pharmacy consulted to restart warfarin post-op.  ASA 81mg  daily resumed POD0.  Pharmacy consulted to resume Lovenox bridge on 6/10.  Today, 06/09/2016:  POD 1  INR 1, remains subtherapeutic as expected after only 1 dose  CBC:  Hgb decreased to 9.5 (expected ABLA), and Plt remain WNL  Diet: regular, eating 100% of meals.  Drug-drug interactions:  warfarin may be increased by doxycycline (started for possible tick exposure), may have increased bleeding risk, therefore starting home dose rather than boosted initial dosing.    SCr 1.26, CrCl ~ 65 ml/min  Note outpatient weight documented as 74 kg (Lovenox 1.5 mg/kg dose was ~120mg ) but current inpatient weight is documented as 73.3 kg.  Will continue outpatient dosing for  continuity.  Goal of Therapy:  INR 2-3 Monitor platelets by anticoagulation protocol: Yes   Plan:  Warfarin 7.5 mg PO x 1 tonight.   Daily PT/INR  Monitor closely for DDI with doxycycline Lovenox 120 mg SQ q24h until INR >/= 2   Lynann Beaver PharmD, BCPS Pager [phone] 06/09/2016 10:50 AM

## 2016-06-09 NOTE — Op Note (Signed)
NAMEDEMARLO, BHOLA NO.:  [id number]  MEDICAL RECORD NO.:  [id number]  LOCATION:  1605                         FACILITY:  [facility]  PHYSICIAN:  Vanita Panda. Magnus Ivan, M.D.DATE OF BIRTH:  03-23-1966  DATE OF PROCEDURE:  06/08/2016 DATE OF DISCHARGE:                              OPERATIVE REPORT   POSTOPERATIVE DIAGNOSIS:  End-stage avascular necrosis, bilateral hips.  POSTOPERATIVE DIAGNOSIS:  End-stage avascular necrosis, bilateral hips.  PROCEDURE:  Left total hip arthroplasty under direct anterior approach.  IMPLANTS:  DePuy Sector Gription acetabular component size 52, size 36+ 0 neutral polyethylene liner, size 12 Corail femoral component with varus offset (KLA), size 36+ 1.5 ceramic hip ball.  SURGEON:  Vanita Panda. Magnus Ivan, M.D.  ASSISTANT:  Richardean Canal, PA-C.  ANESTHESIA:  Spinal.  ANTIBIOTICS:  2 g IV Ancef.  BLOOD LOSS:  150 mL.  COMPLICATIONS:  None.  INDICATIONS:  Mr. Lamson is a 50 year old gentleman with known end-stage avascular necrosis of both his hips.  We have listed this as idiopathic avascular necrosis but it may be from alcohol use in the past.  He was presenting to have both hips done at once, since he is a thin individual; however on exam in the holding room he does have a large bug bite with increased irritation on his right hip, along where the incision would be, so I told him this would be something that we would need to treat and only perform his left hip today since his left hip is the one that hurts worse anyway, but both hips have severe avascular necrosis.  He agreed that we would only proceed with left total hip arthroplasty today.  PROCEDURE DESCRIPTION:  After informed consent was obtained, appropriate left hip was marked.  He was brought to the operating room on the stretcher.  Spinal anesthesia was obtained while he was on the stretcher.  A Foley catheter was placed.  Then, both feet had traction boots  applied to them.  Next, he was placed supine on the Hana fracture table with the perineal post in place and both legs in inline skeletal traction devices, but no traction applied.  His left operative hip was prepped and draped with DuraPrep sterile drapes.  Time-out was called and he was identified as correct patient and correct left hip.  We then made an incision inferior and posterior to the anterior superior iliac spine and carried this obliquely down the leg.  We dissected down the tensor fascia lata muscle and the tensor fascia was then divided longitudinally, so we could proceed with a direct anterior approach to the hip.  We identified and cauterized the circumflex vessels and then identified the hip capsule.  I opened up the hip capsule in an L-type format.  We found a large joint effusion and already cartilage breaking off from his hip ball.  We placed Cobra retractors within the hip capsule.  We made our femoral neck cut with an oscillating saw proximal to the lesser trochanter and completed this on osteotome.  We placed a corkscrew guide in the femoral head and removed the femoral head in several pieces and the cartilage is definitely fragmented off consistent  with end-stage avascular necrosis.  We then cleaned the acetabulum and remnants of the acetabular labrum and other debris.  I placed a bent Hohmann over the medial acetabular rim and then began reaming under direct visualization from a size 42 reamer up to a size 52 with all reamers under direct visualization and the last reamer under direct fluoroscopy, so I could obtain my depth of reaming, our inclination, and anteversion.  Once I was pleased with this, I placed the real DePuy Sector Gription acetabular component size 52 and a 36+ 0 neutral polyethylene liner for that size acetabular component.  Attention was then turned to the femur with the leg externally rotated to 120 degrees extended and adducted.  We were able to  release the lateral joint capsule in the piriformis and used a box cutting osteotome to enter the femoral canal and a rongeur to lateralize at a Mueller retractor medially and Hohmann retractor behind the greater trochanter.  Once we lateralized, we broached all the way up to a size 12 broach.  With the 12 broach in place, we trialed a standard offset femoral neck and a 36+ 1.5 trial hip ball.  We brought the leg back over and up with traction and internal rotation, reducing the pelvis and it was definitely tight and had too much leg length.  We dislocated the hip and removed the trial components.  We were able to deepen the 12, it is a little bit more lateralized.  We then with the real varus offset femoral neck to shorten this a little bit more to a 12 Corail stem.  We placed this stem without difficulty and then went with a real 36+ 1.5 ceramic hip ball, reducing the pelvis and we were pleased with leg length, offset, and range of motion and stability.  We then irrigated the soft tissue with normal saline solution using pulsatile lavage.  We were able to close the joint capsule with interrupted #1 Ethibond suture followed by running #1 Vicryl in the tensor fascia, 0 Vicryl in the deep tissue, 2-0 Vicryl in the subcutaneous tissue, 4-0 Monocryl subcuticular stitch, and Steri-Strips on the skin.  An Aquacel dressing was applied.  He was taken off the Hana table and taken to the recovery room in stable condition.  All final counts were correct.  There were no complications noted.  Of note, Richardean Canal, PA-C assisted in the entire case.  His assistance was crucial for facilitating all aspects of this case.     Vanita Panda. Magnus Ivan, M.D.     CYB/MEDQ  D:  06/08/2016  T:  06/09/2016  Job:  [id number]

## 2016-06-09 NOTE — Evaluation (Signed)
Occupational Therapy One Time Evaluation Patient Details Name: Steve Vang MRN: [id number] DOB: 02/01/1966 Today's Date: 06/09/2016    History of Present Illness Pt s/p L THR and with hx of CAD, CVA, COPD, CABG and cardiomyopathy   Clinical Impression   Pt tends to be alittle impulsive and needs verbal cues for safe use of crutches for functional transfers to bathroom and back. He states his wife can assist PRN with LB self care at d/c. Issued reacher for use with retrieving items. All education provided. Feel he will progress with functional transfer with PT.     Follow Up Recommendations  Supervision/Assistance - 24 hour    Equipment Recommendations  None recommended by OT    Recommendations for Other Services       Precautions / Restrictions Precautions Precautions: Fall Restrictions Weight Bearing Restrictions: No Other Position/Activity Restrictions: WBAT      Mobility Bed Mobility               General bed mobility comments: OOB in recliner  Transfers Overall transfer level: Needs assistance Equipment used: Crutches Transfers: Sit to/from Stand Sit to Stand: Min guard         General transfer comment: cues for safety and hand placement. pt attempting to get up before crutches nearby.    Balance                                            ADL Overall ADL's : Needs assistance/impaired Eating/Feeding: Independent;Sitting   Grooming: Wash/dry hands;Set up;Sitting   Upper Body Bathing: Set up;Sitting   Lower Body Bathing: Minimal assistance;Sit to/from stand   Upper Body Dressing : Set up;Sitting   Lower Body Dressing: Minimal assistance;Sit to/from stand   Toilet Transfer: Min guard;Ambulation;Comfort height toilet (crutches)   Toileting- Clothing Manipulation and Hygiene: Min guard;Sit to/from stand   Tub/ Shower Transfer: Tub Scientist, clinical (histocompatibility and immunogenetics) Details (indicate cue type and reason): pt  held to wall of doorway similar to wall of shower. cues for technique and sequence. Able to stand unassisted and without crutches to reach down and simulate bathing in shower.    General ADL Comments: Pt with min assist required for LB self care for L side. Pt states his wife can help and declines wanting AE. Did feel a reacher would be helpful for picking up items and issued reacher to pt. Discussed tub transfers and practiced stepping over similar height as tub and pt with min assist required. Educated on sequence for stepping over tub. When pt exited bathroom, pt not using crutches properly and did not have them under his armpits. Educated him on importance of using crutches properly.  Pt has a regular commode but has a counter beside of commode and tub on the other side. He did well wtih comfort height commode and use of bar similar to having counter support. Pt declines needing a 3in1 and feel pt will do fine with standard commode height.      Vision     Perception     Praxis      Pertinent Vitals/Pain Pain Assessment: 0-10 Pain Score: 3  Pain Location: L thigh Pain Descriptors / Indicators: Aching;Discomfort;Grimacing;Sore;Tightness Pain Intervention(s): Monitored during session;Premedicated before session;Repositioned;Ice applied     Hand Dominance     Extremity/Trunk Assessment Upper Extremity Assessment Upper Extremity Assessment: Overall WFL for tasks assessed  Communication Communication Communication: No difficulties   Cognition Arousal/Alertness: Awake/alert Behavior During Therapy: WFL for tasks assessed/performed Overall Cognitive Status: Within Functional Limits for tasks assessed                     General Comments       Exercises       Shoulder Instructions      Home Living Family/patient expects to be discharged to:: Private residence Living Arrangements: Spouse/significant other Available Help at Discharge: Family Type of Home:  House Home Access: Stairs to enter Secretary/administrator of Steps: 3 Entrance Stairs-Rails: Right;Left;Can reach both Home Layout: One level     Bathroom Shower/Tub: Chief Strategy Officer: Standard     Home Equipment: Environmental consultant - 2 wheels;Crutches          Prior Functioning/Environment Level of Independence: Independent;Independent with assistive device(s)        Comments: Utilized crutches as needed    OT Diagnosis: Generalized weakness   OT Problem List:     OT Treatment/Interventions:      OT Goals(Current goals can be found in the care plan section) Acute Rehab OT Goals Patient Stated Goal: none stated. OT Goal Formulation: With patient  OT Frequency:     Barriers to D/C:            Co-evaluation              End of Session Equipment Utilized During Treatment: Other (comment) (crutches)  Activity Tolerance: Patient tolerated treatment well Patient left: in chair;with call bell/phone within reach;with chair alarm set   Time: 1140-1154 OT Time Calculation (min): 14 min Charges:  OT General Charges $OT Visit: 1 Procedure OT Evaluation $OT Eval Low Complexity: 1 Procedure G-Codes:    Steve Vang  [phone] 06/09/2016, 3:01 PM

## 2016-06-10 LAB — CBC
HEMATOCRIT: 25.6 % — AB (ref 39.0–52.0)
HEMOGLOBIN: 8.5 g/dL — AB (ref 13.0–17.0)
MCH: 29.6 pg (ref 26.0–34.0)
MCHC: 33.2 g/dL (ref 30.0–36.0)
MCV: 89.2 fL (ref 78.0–100.0)
Platelets: 186 10*3/uL (ref 150–400)
RBC: 2.87 MIL/uL — AB (ref 4.22–5.81)
RDW: 14.8 % (ref 11.5–15.5)
WBC: 8.3 10*3/uL (ref 4.0–10.5)

## 2016-06-10 LAB — PROTIME-INR
INR: 1.23 (ref 0.00–1.49)
PROTHROMBIN TIME: 15.7 s — AB (ref 11.6–15.2)

## 2016-06-10 MED ORDER — ENOXAPARIN SODIUM 120 MG/0.8ML ~~LOC~~ SOLN: 120.0000 mg | SUBCUTANEOUS | Status: DC

## 2016-06-10 MED ORDER — HYDROMORPHONE HCL 2 MG PO TABS: 2.0000 mg | ORAL_TABLET | ORAL | Status: DC | PRN

## 2016-06-10 MED ORDER — METHOCARBAMOL 500 MG PO TABS: 500.0000 mg | ORAL_TABLET | Freq: Four times a day (QID) | ORAL | Status: DC | PRN

## 2016-06-10 NOTE — Progress Notes (Signed)
Discharged from floor via personal w/c for transport home by vehicle, wife & belongings with pt. No changes in assessment. Steve Vang, [employer]

## 2016-06-10 NOTE — Anesthesia Postprocedure Evaluation (Signed)
Anesthesia Post Note  Patient: Steve Vang  Procedure(s) Performed: Procedure(s) (LRB): TOTAL HIP ARTHROPLASTY ANTERIOR APPROACH (Left)  Patient location during evaluation: PACU Anesthesia Type: Spinal Level of consciousness: awake Pain management: pain level controlled Vital Signs Assessment: post-procedure vital signs reviewed and stable Respiratory status: spontaneous breathing Cardiovascular status: stable Postop Assessment: no signs of nausea or vomiting and spinal receding Anesthetic complications: no    Last Vitals:  Filed Vitals:   06/09/16 2107 06/10/16 0453  BP: 124/74 118/60  Pulse: 82 65  Temp: 36.8 C 36.5 C  Resp: 16 16    Last Pain:  Filed Vitals:   06/10/16 0454  PainSc: 4                  Tarrie Mcmichen

## 2016-06-10 NOTE — Progress Notes (Signed)
Subjective: 2 Days Post-Op Procedure(s) (LRB): TOTAL HIP ARTHROPLASTY ANTERIOR APPROACH (Left) Patient reports pain as moderate.  Acute blood loss anemia from surgery.  Objective: Vital signs in last 24 hours: Temp:  [97.7 F (36.5 C)-98.4 F (36.9 C)] 97.7 F (36.5 C) (06/11 0453) Pulse Rate:  [65-82] 65 (06/11 0453) Resp:  [15-16] 16 (06/11 0453) BP: (98-124)/(60-79) 118/60 mmHg (06/11 0453) SpO2:  [96 %-100 %] 96 % (06/11 0453) Weight:  [73.347 kg (161 lb 11.2 oz)] 73.347 kg (161 lb 11.2 oz) (06/10 1403)  Intake/Output from previous day: 06/10 0701 - 06/11 0700 In: 1287.5 [P.O.:1080; I.V.:207.5] Out: 350 [Urine:350] Intake/Output this shift:     Recent Labs  06/09/16 0357 06/10/16 0425  HGB 9.5* 8.5*    Recent Labs  06/09/16 0357 06/10/16 0425  WBC 13.2* 8.3  RBC 3.19* 2.87*  HCT 28.0* 25.6*  PLT 217 186    Recent Labs  06/09/16 0357  NA 134*  K 4.1  CL 103  CO2 25  BUN 15  CREATININE 1.26*  GLUCOSE 313*  CALCIUM 8.6*    Recent Labs  06/09/16 0357 06/10/16 0425  INR 1.00 1.23    Sensation intact distally Intact pulses distally Dorsiflexion/Plantar flexion intact Incision: scant drainage Compartment soft  Assessment/Plan: 2 Days Post-Op Procedure(s) (LRB): TOTAL HIP ARTHROPLASTY ANTERIOR APPROACH (Left) Up with therapy Discharge home with home health today.  BLACKMAN,CHRISTOPHER Y 06/10/2016, 7:54 AM

## 2016-06-10 NOTE — Discharge Summary (Signed)
Patient ID: Steve Vang MRN: [id number] DOB/AGE: May 21, 1966 50 y.o.  Admit date: 06/08/2016 Discharge date: 06/10/2016  Admission Diagnoses:  Principal Problem:   Avascular necrosis of bones of both hips (HCC) Active Problems:   Status post total replacement of left hip   Discharge Diagnoses:  Same  Past Medical History  Diagnosis Date  . CAD (coronary artery disease)     Multivessel, LVEF 45-50%  . History of stroke 2004  . Hyperlipidemia   . Essential hypertension   . Left ventricular mural thrombus (HCC)     On Coumadin  . Depression   . Falls   . CKD (chronic kidney disease) stage 2, GFR 60-89 ml/min   . Gallstone   . Cardiomyopathy (HCC)     LVEF 50-55% December 2016  . Gastroenteritis   . COPD (chronic obstructive pulmonary disease) (HCC)     Surgeries: Procedure(s): TOTAL HIP ARTHROPLASTY ANTERIOR APPROACH on 06/08/2016   Consultants:    Discharged Condition: Improved  Hospital Course: BABYBOY LOYA is an 50 y.o. male who was admitted 06/08/2016 for operative treatment ofAvascular necrosis of bones of both hips (HCC). Patient has severe unremitting pain that affects sleep, daily activities, and work/hobbies. After pre-op clearance the patient was taken to the operating room on 06/08/2016 and underwent  Procedure(s): TOTAL HIP ARTHROPLASTY ANTERIOR APPROACH.    Patient was given perioperative antibiotics: Anti-infectives    Start     Dose/Rate Route Frequency Ordered Stop   06/08/16 2200  doxycycline (VIBRA-TABS) tablet 100 mg    Comments:  Will be receiving due to "bug bite" on right thigh that may be a tick bite   100 mg Oral Every 12 hours 06/08/16 1718     06/08/16 2100  ceFAZolin (ANCEF) IVPB 1 g/50 mL premix     1 g 100 mL/hr over 30 Minutes Intravenous Every 6 hours 06/08/16 1816 06/09/16 0600   06/08/16 1215  ceFAZolin (ANCEF) IVPB 2g/100 mL premix     2 g 200 mL/hr over 30 Minutes Intravenous On call to O.R. 06/08/16 1202 06/08/16 1509       Patient  was given sequential compression devices, early ambulation, and chemoprophylaxis to prevent DVT.  Patient benefited maximally from hospital stay and there were no complications.    Recent vital signs: Patient Vitals for the past 24 hrs:  BP Temp Temp src Pulse Resp SpO2 Weight  06/10/16 0453 118/60 mmHg 97.7 F (36.5 C) Oral 65 16 96 % -  06/09/16 2107 124/74 mmHg 98.3 F (36.8 C) Oral 82 16 99 % -  06/09/16 1459 111/79 mmHg 98.4 F (36.9 C) Oral 80 16 100 % -  06/09/16 1403 - - - - - - 73.347 kg (161 lb 11.2 oz)  06/09/16 1001 98/65 mmHg 97.7 F (36.5 C) Oral 67 15 99 % -     Recent laboratory studies:  Recent Labs  06/09/16 0357 06/10/16 0425  WBC 13.2* 8.3  HGB 9.5* 8.5*  HCT 28.0* 25.6*  PLT 217 186  NA 134*  --   K 4.1  --   CL 103  --   CO2 25  --   BUN 15  --   CREATININE 1.26*  --   GLUCOSE 313*  --   INR 1.00 1.23  CALCIUM 8.6*  --      Discharge Medications:     Medication List    STOP taking these medications        enoxaparin 120 MG/0.8ML injection  Commonly  known as:  LOVENOX     oxyCODONE-acetaminophen 10-325 MG tablet  Commonly known as:  PERCOCET      TAKE these medications        aspirin EC 81 MG tablet  Take 81 mg by mouth daily.     citalopram 20 MG tablet  Commonly known as:  CELEXA  Take 20 mg by mouth every evening. Reported on 05/15/2016     HYDROmorphone 2 MG tablet  Commonly known as:  DILAUDID  Take 1 tablet (2 mg total) by mouth every 4 (four) hours as needed for severe pain.     lisinopril 20 MG tablet  Commonly known as:  PRINIVIL,ZESTRIL  TAKE ONE TABLET BY MOUTH ONCE DAILY     methocarbamol 500 MG tablet  Commonly known as:  ROBAXIN  Take 1 tablet (500 mg total) by mouth every 6 (six) hours as needed for muscle spasms.     metoprolol tartrate 25 MG tablet  Commonly known as:  LOPRESSOR  Take 1 tablet (25 mg total) by mouth 2 (two) times daily.     nitroGLYCERIN 0.4 MG SL tablet  Commonly known as:  NITROSTAT   Place 1 tablet (0.4 mg total) under the tongue every 5 (five) minutes as needed.     simvastatin 20 MG tablet  Commonly known as:  ZOCOR  Take 20 mg by mouth daily.     warfarin 5 MG tablet  Commonly known as:  COUMADIN  Take 1 1/2 tablets daily except 1 tablet on Sundays and Thursdays        Diagnostic Studies: Dg Chest 2 View  05/15/2016  CLINICAL DATA:  Left anterior chest pain since this morning EXAM: CHEST  2 VIEW COMPARISON:  September 03, 2010 FINDINGS: The heart size and mediastinal contours are stable. Patient status post prior CABG and median sternotomy. Both lungs are clear. The visualized skeletal structures are unremarkable. IMPRESSION: No active cardiopulmonary disease. Electronically Signed   By: Sherian Rein M.D.   On: 05/15/2016 14:09   US Abdomen Complete  05/16/2016  CLINICAL DATA:  Abdominal pain for 1 week EXAM: ABDOMEN ULTRASOUND COMPLETE COMPARISON:  CT abdomen and pelvis May 15, 2016 FINDINGS: Gallbladder: Gallbladder appears contracted. There is a 4 mm echogenic focus within the gallbladder which shadows consistent with gallstone. No pericholecystic fluid. No sonographic Murphy sign noted by sonographer. Common bile duct: Diameter: 3 mm. No intrahepatic, common hepatic, or common bile duct dilatation. Liver: No focal lesion identified. Liver echogenicity is overall increased. IVC: No abnormality visualized. Pancreas: Visualized portion unremarkable. Portions of pancreas obscured by gas. Spleen: Size and appearance within normal limits. Right Kidney: Length: 10.1 cm. Echogenicity within normal limits. No mass or hydronephrosis visualized. Left Kidney: Length: 10.2 cm. Echogenicity within normal limits. No mass or hydronephrosis visualized. Abdominal aorta: No aneurysm visualized. Other findings: No demonstrable ascites. IMPRESSION: Cholelithiasis. Gallbladder somewhat contracted. No pericholecystic fluid. A degree of cholecystitis must be of concern given this overall  appearance. It may be reasonable to correlate with nuclear medicine hepatobiliary imaging study to assess for cystic duct patency. Portions of pancreas obscured by gas. Visualized portions of pancreas appear normal. Increased liver echogenicity, a finding most likely due to hepatic steatosis. While no focal liver lesions are identified, it must be cautioned that sensitivity of ultrasound for focal liver lesions is diminished in this circumstance. Study otherwise unremarkable. Electronically Signed   By: Bretta Bang III M.D.   On: 05/16/2016 13:54   Dg C-arm 61-120 Min-no Report  06/08/2016  CLINICAL DATA: OA C-ARM 61-120 MINUTES Fluoroscopy was utilized by the requesting physician.  No radiographic interpretation.   Ct Renal Stone Study  05/15/2016  CLINICAL DATA:  Upper abdominal pain for 1 week. EXAM: CT ABDOMEN AND PELVIS WITHOUT CONTRAST TECHNIQUE: Multidetector CT imaging of the abdomen and pelvis was performed following the standard protocol without IV contrast. COMPARISON:  None. FINDINGS: Lower chest:  The lung bases appear clear.  No pleural effusion. Hepatobiliary: There is hepatic steatosis noted. The gallbladder appears within normal limits. Pancreas: No mass or inflammatory process identified on this un-enhanced exam. Spleen: Within normal limits in size. Adrenals/Urinary Tract: Normal adrenal glands. The kidneys are unremarkable. No kidney stones or nephrolithiasis. The urinary bladder is normal. Stomach/Bowel: The stomach is normal. The small bowel loops have a normal course and caliber. The appendix is visualized and is upper limits of normal in diameter measuring 7 mm. No periappendiceal fat stranding or free fluid identified. No pathologic dilatation of the colon. Vascular/Lymphatic: No pathologically enlarged lymph nodes. No evidence of abdominal aortic aneurysm. Reproductive: No mass or other significant abnormality. Other: None. Musculoskeletal: Evidence of bilateral hip avascular  necrosis with sub chondral fracture is again identified. IMPRESSION: 1. No acute findings identified within the abdomen or pelvis. 2. Aortic atherosclerosis noted. 3. The appendix is visualized and is upper limits of normal in caliber. No periappendiceal fat stranding or free fluid. Correlate for any focal tenderness in this area. 4. Bilateral hip avascular necrosis. Electronically Signed   By: Signa Kell M.D.   On: 05/15/2016 20:24   Dg Hip Port Unilat With Pelvis 1v Left  06/08/2016  CLINICAL DATA:  Left hip arthroplasty. EXAM: DG HIP (WITH OR WITHOUT PELVIS) 1V PORT LEFT COMPARISON:  Intraoperative films of earlier today. FINDINGS: AP and lateral views demonstrate left hip arthroplasty without acute hardware complication. Osseous irregularity involving the right femoral head/ neck junction is similar to the 04/19/2016 exam and likely degenerative. IMPRESSION: Expected appearance after left hip arthroplasty. Electronically Signed   By: Jeronimo Greaves M.D.   On: 06/08/2016 17:34   Dg Hip Operative Unilat With Pelvis Left  06/08/2016  CLINICAL DATA:  Left hip osteoarthritis. Status post left hip replacement. EXAM: OPERATIVE LEFT HIP (WITH PELVIS IF PERFORMED) 2 VIEWS TECHNIQUE: Fluoroscopic spot image(s) were submitted for interpretation post-operatively. COMPARISON:  None. FINDINGS: Bipolar left hip prosthesis is seen in expected position. No evidence of fracture or dislocation. IMPRESSION: Expected postoperative appearance of bipolar left hip prosthesis. Electronically Signed   By: Myles Rosenthal M.D.   On: 06/08/2016 16:47    Disposition: 01-Home or Self Care      Discharge Instructions    Call MD / Call 911    Complete by:  As directed   If you experience chest pain or shortness of breath, CALL 911 and be transported to the hospital emergency room.  If you develope a fever above 101 F, pus (white drainage) or increased drainage or redness at the wound, or calf pain, call your surgeon's office.      Constipation Prevention    Complete by:  As directed   Drink plenty of fluids.  Prune juice may be helpful.  You may use a stool softener, such as Colace (over the counter) 100 mg twice a day.  Use MiraLax (over the counter) for constipation as needed.     Diet - low sodium heart healthy    Complete by:  As directed      Discharge patient  Complete by:  As directed      Increase activity slowly as tolerated    Complete by:  As directed            Follow-up Information    Follow up with [facility].   Why:  Home Health Physical Therapy   Contact information:   [address] [place] [address] [phone]       Follow up with Kathryne Hitch, MD In 2 weeks.   Specialty:  Orthopedic Surgery   Contact information:   [address] [place] [address] [phone]        Signed: Kathryne Hitch 06/10/2016, 7:57 AM

## 2016-06-10 NOTE — Progress Notes (Signed)
Physical Therapy Treatment Patient Details Name: Steve Vang MRN: [id number] DOB: 1966/07/15 Today's Date: 06/10/2016    History of Present Illness Pt s/p L THR and with hx of CAD, CVA, COPD, CABG and cardiomyopathy    PT Comments    PT goals met, ready to DC home from PT standpoint.   Follow Up Recommendations  Home health PT     Equipment Recommendations  None recommended by PT    Recommendations for Other Services       Precautions / Restrictions Precautions Precautions: Fall Restrictions Weight Bearing Restrictions: No Other Position/Activity Restrictions: WBAT    Mobility  Bed Mobility               General bed mobility comments: OOB in recliner  Transfers Overall transfer level: Modified independent Equipment used: Crutches                Ambulation/Gait Ambulation/Gait assistance: Modified independent (Device/Increase time) Ambulation Distance (Feet): 150 Feet Assistive device: Crutches Gait Pattern/deviations: Step-through pattern   Gait velocity interpretation: at or above normal speed for age/gender General Gait Details: no LOB with crutches   Stairs            Wheelchair Mobility    Modified Rankin (Stroke Patients Only)       Balance                                    Cognition Arousal/Alertness: Awake/alert Behavior During Therapy: WFL for tasks assessed/performed Overall Cognitive Status: Within Functional Limits for tasks assessed                      Exercises Total Joint Exercises Ankle Circles/Pumps: AROM;Both;15 reps;Supine Heel Slides: AAROM;Left;Supine;10 reps Hip ABduction/ADduction:  (deferred, pt reported increased pain from this exercise) Long Arc Quad: AROM;Left;10 reps;Seated    General Comments        Pertinent Vitals/Pain Pain Score: 7  Pain Location: L hip  Pain Descriptors / Indicators: Sore Pain Intervention(s): Monitored during session;Premedicated before  session;Limited activity within patient's tolerance;Ice applied    Home Living                      Prior Function            PT Goals (current goals can now be found in the care plan section) Acute Rehab PT Goals Patient Stated Goal: restore old cars PT Goal Formulation: With patient/family Time For Goal Achievement: 06/11/16 Potential to Achieve Goals: Good Progress towards PT goals: Goals met/education completed, patient discharged from PT    Frequency  7X/week    PT Plan Current plan remains appropriate    Co-evaluation             End of Session Equipment Utilized During Treatment: Gait belt Activity Tolerance: Patient tolerated treatment well Patient left: in chair;with call bell/phone within reach;with family/visitor present     Time: 1884-1660 PT Time Calculation (min) (ACUTE ONLY): 8 min  Charges:  $Gait Training: 8-22 mins                    G Codes:      Philomena Doheny 06/10/2016, 10:47 AM [phone]

## 2016-06-10 NOTE — Discharge Instructions (Signed)

## 2016-06-10 NOTE — Progress Notes (Signed)
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 Benadryl [Diphenhydramine] Hives and Swelling  . Chantix [Varenicline] Nausea And Vomiting    Patient Measurements: Height: 5\' 7"  (170.2 cm) Weight: 161 lb 11.2 oz (73.347 kg) IBW/kg (Calculated) : 66.1  Vital Signs: Temp: 97.7 F (36.5 C) (06/11 0453) Temp Source: Oral (06/11 0453) BP: 118/60 mmHg (06/11 0453) Pulse Rate: 65 (06/11 0453)  Labs:  Recent Labs  06/08/16 1225 06/08/16 1226 06/09/16 0357 06/10/16 0425  HGB  --   --  9.5* 8.5*  HCT  --   --  28.0* 25.6*  PLT  --   --  217 186  APTT  --  26  --   --   LABPROT 12.3  --  13.4 15.7*  INR 0.89  --  1.00 1.23  CREATININE  --   --  1.26*  --     Estimated Creatinine Clearance: 65.6 mL/min (by C-G formula based on Cr of 1.26).   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  Pt stopped warfarin on 6/2 and has been bridged with lovenox (LD 6/8 AM).  Pharmacy consulted to restart warfarin post-op.  ASA 81mg  daily resumed POD0.  Pharmacy consulted to resume Lovenox bridge on 6/10.  Today, 06/10/2016:  POD 1  INR 1.23, remains subtherapeutic but increasing as expected  CBC:  Hgb decreased to 9.5 (expected ABLA), and Plt remain WNL  Diet: regular, eating 100% of meals.  Drug-drug interactions:  warfarin may be increased by doxycycline (started for possible tick exposure), may have increased bleeding risk, therefore starting home dose rather than boosted initial dosing.    SCr 1.26, CrCl ~ 65 ml/min  Note outpatient weight documented as 74 kg (Lovenox 1.5 mg/kg dose was ~120mg ) but current inpatient weight is documented as 73.3 kg.   Will continue outpatient dosing for continuity.  Goal of Therapy:  INR 2-3 Monitor platelets by anticoagulation protocol: Yes   Plan:   Warfarin 7.5 mg PO x 1 tonight.    Daily PT/INR   Monitor closely for DDI with doxycycline  Lovenox 120 mg SQ q24h until INR >/= 2  For discharge, recommend to use home warfarin dose (7.5mg  daily except 5mg  on Sunday / Thursday) and continue Lovenox 120mg  SQ q24 until INR > 2.  Recheck INR in 3 days.   Lynann Beaver PharmD, BCPS Pager [phone] 06/10/2016 8:45 AM

## 2016-06-11 ENCOUNTER — Encounter (HOSPITAL_COMMUNITY): Payer: Self-pay | Admitting: Orthopaedic Surgery

## 2016-06-14 ENCOUNTER — Telehealth: Payer: Self-pay | Admitting: *Deleted

## 2016-06-14 NOTE — Telephone Encounter (Signed)
-----   Message from Louanna Raw, RN sent at 06/13/2016  4:54 PM EDT ----- I will let her do it as I need her to until he is able to get around better.  Maybe 3-4 weeks.  He will probably need another check next next week. ----- Message -----    From: Albertine Patricia, CMA    Sent: 06/13/2016   4:52 PM      To: Louanna Raw, RN  Ok thanks, she also wanted to know would he be coming back to see you or would she need to plan to do it every 2 weeks or so ----- Message -----    From: Louanna Raw, RN    Sent: 06/13/2016   4:26 PM      To: Albertine Patricia, CMA  She can check tomorrow or Friday.  Thanks ----- Message -----    From: Albertine Patricia, CMA    Sent: 06/13/2016   1:21 PM      To: Louanna Raw, RN  Nurse from [facility] called and wanted to know how often to repeat INR on pt. Last on in [facility] was 06/10/16 when pt was d/c. I didn't see any future appointments with you on schedule. You can let me know and I'll call her back. Thanks  [employer]

## 2016-06-14 NOTE — Telephone Encounter (Signed)
Steve Vang made aware and will check INR next 4 weeks.

## 2016-06-15 ENCOUNTER — Ambulatory Visit (INDEPENDENT_AMBULATORY_CARE_PROVIDER_SITE_OTHER): Payer: Medicaid Other | Admitting: Pharmacist

## 2016-06-15 ENCOUNTER — Telehealth: Payer: Self-pay | Admitting: Cardiology

## 2016-06-15 DIAGNOSIS — Z5181 Encounter for therapeutic drug level monitoring: Secondary | ICD-10-CM

## 2016-06-15 DIAGNOSIS — I213 ST elevation (STEMI) myocardial infarction of unspecified site: Secondary | ICD-10-CM

## 2016-06-15 DIAGNOSIS — I513 Intracardiac thrombosis, not elsewhere classified: Secondary | ICD-10-CM

## 2016-06-15 LAB — POCT INR: INR: 2.3

## 2016-06-15 NOTE — Telephone Encounter (Signed)
Spoke with pt and Victorino Dike.  INR dosed.  Orders given to recheck next week.

## 2016-06-15 NOTE — Telephone Encounter (Signed)
Victorino Dike with [facility] called pt's PT/INR in and would like Dr. Diona Browner to know the results, stated she'd also be faxing the results. Victorino Dike can be reached @ [phone]  PT-27.9 INR 2.3

## 2016-06-16 IMAGING — RF DG HIP (WITH PELVIS) OPERATIVE*L*
1 series · 2 of 2 positions shown · non-contrast
Comparison: None.

CLINICAL DATA: Left hip osteoarthritis. Status post left hip
replacement.

EXAM:
OPERATIVE LEFT HIP (WITH PELVIS IF PERFORMED) 2 VIEWS
TECHNIQUE: Fluoroscopic spot image(s) were submitted for interpretation
post-operatively.

[Series 1: run · 2 of 2 slices shown]
[im 1/2]
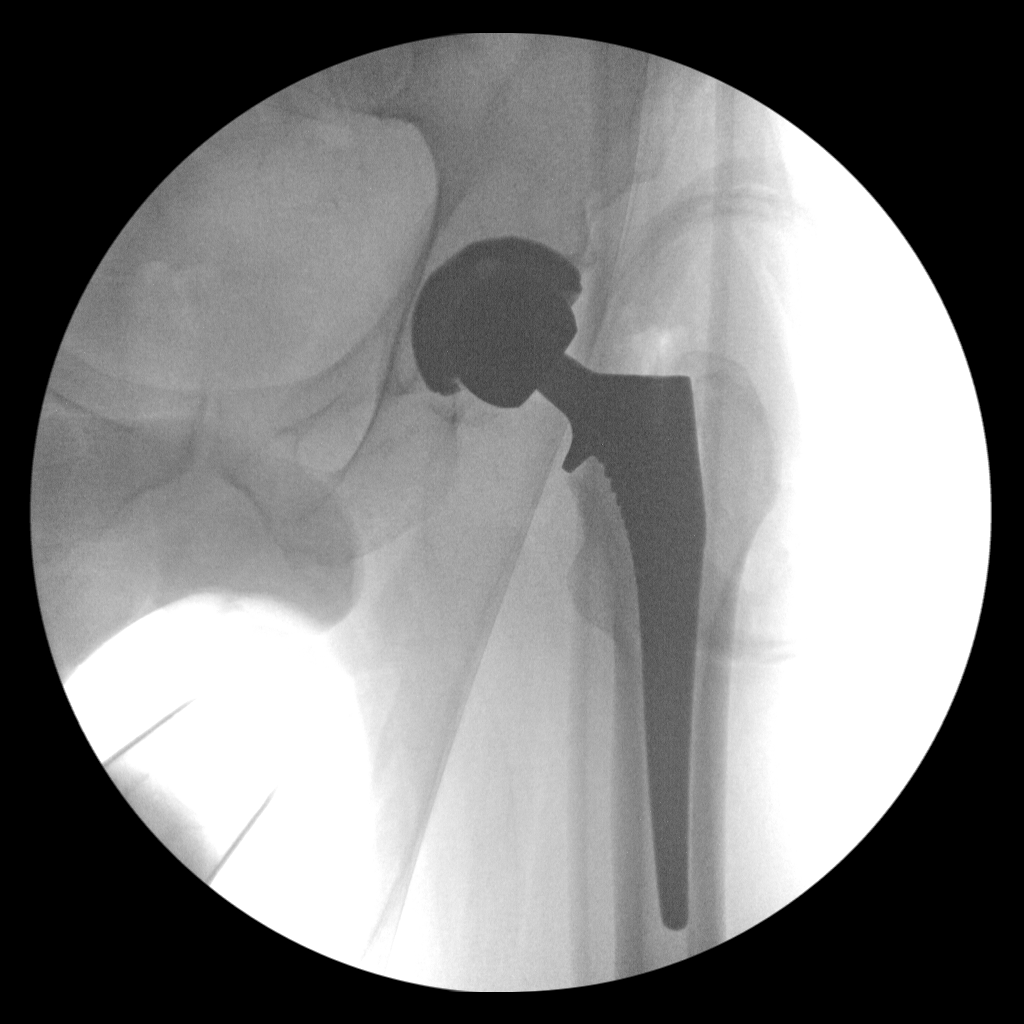
[im 2/2]
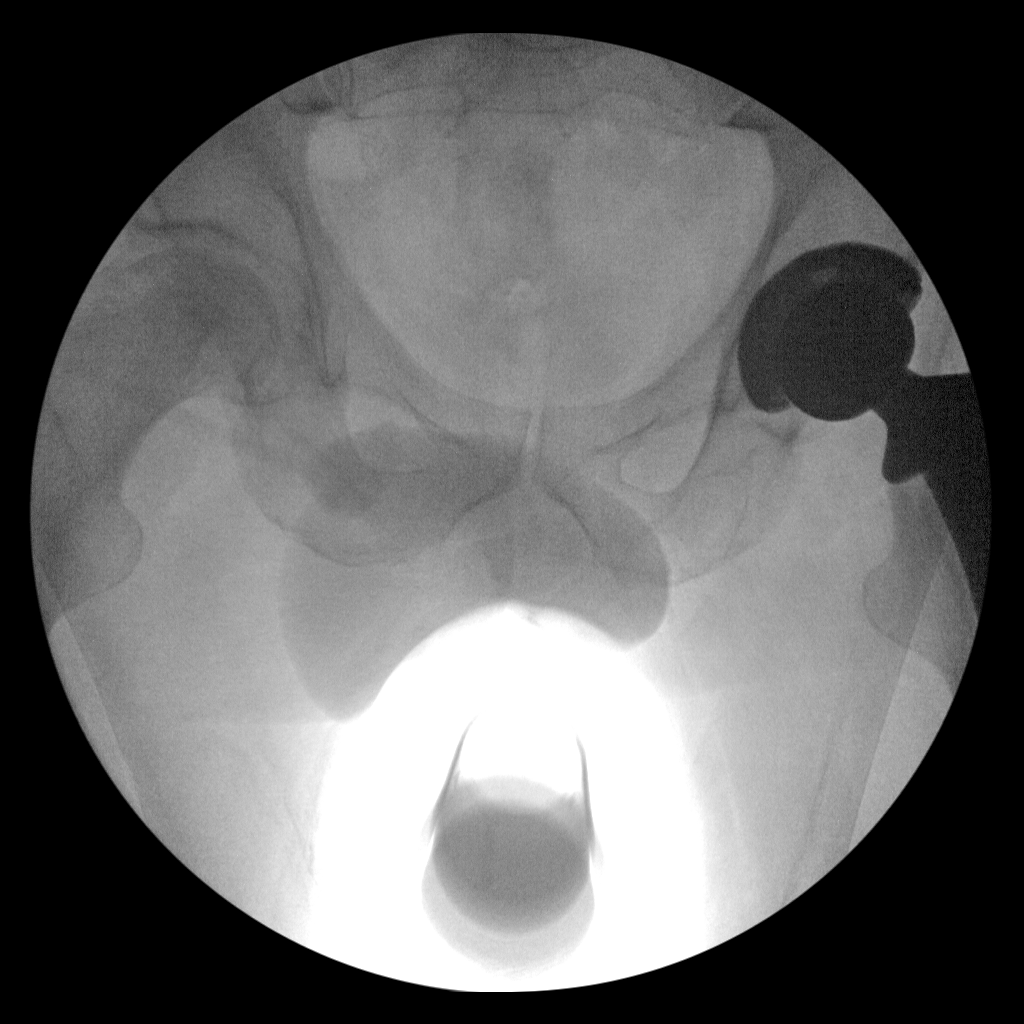

[2 of 2 positions shown; findings below may reference images not displayed]

FINDINGS: Bipolar left hip prosthesis is seen in expected position. No
evidence of fracture or dislocation.
IMPRESSION: Expected postoperative appearance of bipolar left hip prosthesis.

## 2016-07-09 ENCOUNTER — Telehealth: Payer: Self-pay | Admitting: *Deleted

## 2016-07-09 ENCOUNTER — Other Ambulatory Visit: Payer: Self-pay | Admitting: Physician Assistant

## 2016-07-09 NOTE — Telephone Encounter (Signed)
Pt is needing to start Lovenox on Sunday 7/16 for a surgery he's having on Friday 07/20/16. Please call pt w/ instructions

## 2016-07-09 NOTE — Telephone Encounter (Signed)
Returned call to pt, pt's last INR check was 06/15/16 due for check on 06/21/16 no results, pt overdue for f/u.  Pt states since his hip surgery the [facility] RN Victorino Dike has been checking his INR at home.  Pt states he had his INR checked last week and it was 2.5.  Advised pt I will follow-up with [facility] RN to find out where she has been calling results to, but pt will need appt to be Lovenox bridged.  Hip surgery is scheduled for 07/20/16.  Pt states he had his other hip done on 06/08/16 and he was bridged prior to this procedure.  Offered pt an appt in [place] later this week to be bridged but he prefers to go to [place] office tomorrow for Lovenox bridging instructions.  Pt has recently been bridged so he knows how to do Lovenox, just needs to be instructed on dates.    Creat on 06/09/16 1.26, Wt on 06/05/16 69.9kg, CrCl 69.44, Lovenox dosage 100mg  once daily.  Lovenox dosing confirmed by Margaretmary Dys, PharmD. Pt will need to Hold Warfarin 5 days prior to procedure.   07/14/16: Last dose of Coumadin  07/15/16: No coumadin or Lovenox   07/16/16: Inject Lovenox 100mg  in the fatty abdominal tissue at least 2 inches from the belly button once a day at 8am.  No Coumadin  07/17/16: Inject Lovenox in the fatty tissue once daily at 8am. No Coumadin  07/18/16: Inject Lovenox in the fatty tissue once daily at 8am. No Coumadin  07/19/16: Inject Lovenox in the fatty tissue once daily at 8 am . No Coumadin  07/20/16: Procedure Day - No Lovenox - Pt will be admitted to hospital post procedure.  Pt will need this above bridging information at OV tomorrow in [place] office. Victorino Dike is pt's [facility] RN from [employer] [phone].

## 2016-07-10 ENCOUNTER — Ambulatory Visit (INDEPENDENT_AMBULATORY_CARE_PROVIDER_SITE_OTHER): Payer: Medicaid Other | Admitting: *Deleted

## 2016-07-10 DIAGNOSIS — I513 Intracardiac thrombosis, not elsewhere classified: Secondary | ICD-10-CM

## 2016-07-10 DIAGNOSIS — I213 ST elevation (STEMI) myocardial infarction of unspecified site: Secondary | ICD-10-CM | POA: Diagnosis not present

## 2016-07-10 DIAGNOSIS — Z5181 Encounter for therapeutic drug level monitoring: Secondary | ICD-10-CM

## 2016-07-10 LAB — POCT INR: INR: 4

## 2016-07-10 MED ORDER — LOVENOX 100 MG/ML ~~LOC~~ SOLN: 100.0000 mg | SUBCUTANEOUS | Status: DC

## 2016-07-10 NOTE — Patient Instructions (Signed)
.    07/14/16: Last dose of Coumadin  07/15/16: No coumadin or Lovenox   07/16/16: Inject Lovenox 100mg  in the fatty abdominal tissue at least 2 inches from the belly button once a day at 8am. No Coumadin  07/17/16: Inject Lovenox in the fatty tissue once daily at 8am. No Coumadin  07/18/16: Inject Lovenox in the fatty tissue once daily at 8am. No Coumadin  07/19/16: Inject Lovenox in the fatty tissue once daily at 8 am . No Coumadin  07/20/16: Procedure Day - No Lovenox - Pt will be admitted to hospital post procedure

## 2016-07-11 NOTE — Patient Instructions (Addendum)
Steve Vang  07/11/2016   Your procedure is scheduled on: Friday 07/20/2016  Report to [facility] Main  Entrance take [place]  elevators to 3rd floor to  Short Stay Center at  125 PM.  Call this number if you have problems the morning of surgery [phone]   Remember: ONLY 1 PERSON MAY GO WITH YOU TO SHORT STAY TO GET  READY MORNING OF YOUR SURGERY.   Do not eat food  :After Midnight.MAY HAVE CLEAR LIQUIDS FROM MIDNIGHT UP UNTIL 0925 AM THEN NOTHING UNTIL AFTER SURGERY!     Take these medicines the morning of surgery with A SIP OF WATER:  METOPROLOL , SIMVASTATIN, Oxycodone if needed                                 You may not have any metal on your body including hair pins and              piercings  Do not wear jewelry, make-up, lotions, powders or perfumes, deodorant             Do not wear nail polish.  Do not shave  48 hours prior to surgery.              Men may shave face and neck.   Do not bring valuables to the hospital. [place] IS NOT             RESPONSIBLE   FOR VALUABLES.  Contacts, dentures or bridgework may not be worn into surgery.  Leave suitcase in the car. After surgery it may be brought to your room.                  Please read over the following fact sheets you were given: _____________________________________________________________________             [facility] - Preparing for Surgery Before surgery, you can play an important role.  Because skin is not sterile, your skin needs to be as free of germs as possible.  You can reduce the number of germs on your skin by washing with CHG (chlorahexidine gluconate) soap before surgery.  CHG is an antiseptic cleaner which kills germs and bonds with the skin to continue killing germs even after washing. Please DO NOT use if you have an allergy to CHG or antibacterial soaps.  If your skin becomes reddened/irritated stop using the CHG and inform your nurse when you arrive at Short  Stay. Do not shave (including legs and underarms) for at least 48 hours prior to the first CHG shower.  You may shave your face/neck. Please follow these instructions carefully:  1.  Shower with CHG Soap the night before surgery and the  morning of Surgery.  2.  If you choose to wash your hair, wash your hair first as usual with your  normal  shampoo.  3.  After you shampoo, rinse your hair and body thoroughly to remove the  shampoo.                           4.  Use CHG as you would any other liquid soap.  You can apply chg directly  to the skin and wash  Gently with a scrungie or clean washcloth.  5.  Apply the CHG Soap to your body ONLY FROM THE NECK DOWN.   Do not use on face/ open                           Wound or open sores. Avoid contact with eyes, ears mouth and genitals (private parts).                       Wash face,  Genitals (private parts) with your normal soap.             6.  Wash thoroughly, paying special attention to the area where your surgery  will be performed.  7.  Thoroughly rinse your body with warm water from the neck down.  8.  DO NOT shower/wash with your normal soap after using and rinsing off  the CHG Soap.                9.  Pat yourself dry with a clean towel.            10.  Wear clean pajamas.            11.  Place clean sheets on your bed the night of your first shower and do not  sleep with pets. Day of Surgery : Do not apply any lotions/deodorants the morning of surgery.  Please wear clean clothes to the hospital/surgery center.  FAILURE TO FOLLOW THESE INSTRUCTIONS MAY RESULT IN THE CANCELLATION OF YOUR SURGERY PATIENT SIGNATURE_________________________________  NURSE SIGNATURE__________________________________  ________________________________________________________________________   Steve Vang  An incentive spirometer is a tool that can help keep your lungs clear and active. This tool measures how well you are  filling your lungs with each breath. Taking long deep breaths may help reverse or decrease the chance of developing breathing (pulmonary) problems (especially infection) following:  A long period of time when you are unable to move or be active. BEFORE THE PROCEDURE   If the spirometer includes an indicator to show your best effort, your nurse or respiratory therapist will set it to a desired goal.  If possible, sit up straight or lean slightly forward. Try not to slouch.  Hold the incentive spirometer in an upright position. INSTRUCTIONS FOR USE   Sit on the edge of your bed if possible, or sit up as far as you can in bed or on a chair.  Hold the incentive spirometer in an upright position.  Breathe out normally.  Place the mouthpiece in your mouth and seal your lips tightly around it.  Breathe in slowly and as deeply as possible, raising the piston or the ball toward the top of the column.  Hold your breath for 3-5 seconds or for as long as possible. Allow the piston or ball to fall to the bottom of the column.  Remove the mouthpiece from your mouth and breathe out normally.  Rest for a few seconds and repeat Steps 1 through 7 at least 10 times every 1-2 hours when you are awake. Take your time and take a few normal breaths between deep breaths.  The spirometer may include an indicator to show your best effort. Use the indicator as a goal to work toward during each repetition.  After each set of 10 deep breaths, practice coughing to be sure your lungs are clear. If you have an incision (the cut made at the time of surgery),  support your incision when coughing by placing a pillow or rolled up towels firmly against it. Once you are able to get out of bed, walk around indoors and cough well. You may stop using the incentive spirometer when instructed by your caregiver.  RISKS AND COMPLICATIONS  Take your time so you do not get dizzy or light-headed.  If you are in pain, you may  need to take or ask for pain medication before doing incentive spirometry. It is harder to take a deep breath if you are having pain. AFTER USE  Rest and breathe slowly and easily.  It can be helpful to keep track of a log of your progress. Your caregiver can provide you with a simple table to help with this. If you are using the spirometer at home, follow these instructions: [place] IF:   You are having difficultly using the spirometer.  You have trouble using the spirometer as often as instructed.  Your pain medication is not giving enough relief while using the spirometer.  You develop fever of 100.5 F (38.1 C) or higher. SEEK IMMEDIATE MEDICAL CARE IF:   You cough up bloody sputum that had not been present before.  You develop fever of 102 F (38.9 C) or greater.  You develop worsening pain at or near the incision site. MAKE SURE YOU:   Understand these instructions.  Will watch your condition.  Will get help right away if you are not doing well or get worse. Document Released: 04/29/2007 Document Revised: 03/10/2012 Document Reviewed: 06/30/2007 ExitCare Patient Information 2014 ExitCare, [place].   ________________________________________________________________________  WHAT IS A BLOOD TRANSFUSION? Blood Transfusion Information  A transfusion is the replacement of blood or some of its parts. Blood is made up of multiple cells which provide different functions.  Red blood cells carry oxygen and are used for blood loss replacement.  White blood cells fight against infection.  Platelets control bleeding.  Plasma helps clot blood.  Other blood products are available for specialized needs, such as hemophilia or other clotting disorders. BEFORE THE TRANSFUSION  Who gives blood for transfusions?   Healthy volunteers who are fully evaluated to make sure their blood is safe. This is blood bank blood. Transfusion therapy is the safest it has ever been in  the practice of medicine. Before blood is taken from a donor, a complete history is taken to make sure that person has no history of diseases nor engages in risky social behavior (examples are intravenous drug use or sexual activity with multiple partners). The donor's travel history is screened to minimize risk of transmitting infections, such as malaria. The donated blood is tested for signs of infectious diseases, such as HIV and hepatitis. The blood is then tested to be sure it is compatible with you in order to minimize the chance of a transfusion reaction. If you or a relative donates blood, this is often done in anticipation of surgery and is not appropriate for emergency situations. It takes many days to process the donated blood. RISKS AND COMPLICATIONS Although transfusion therapy is very safe and saves many lives, the main dangers of transfusion include:   Getting an infectious disease.  Developing a transfusion reaction. This is an allergic reaction to something in the blood you were given. Every precaution is taken to prevent this. The decision to have a blood transfusion has been considered carefully by your caregiver before blood is given. Blood is not given unless the benefits outweigh the risks. AFTER THE TRANSFUSION  Right after receiving a blood transfusion, you will usually feel much better and more energetic. This is especially true if your red blood cells have gotten low (anemic). The transfusion raises the level of the red blood cells which carry oxygen, and this usually causes an energy increase.  The nurse administering the transfusion will monitor you carefully for complications. HOME CARE INSTRUCTIONS  No special instructions are needed after a transfusion. You may find your energy is better. Speak with your caregiver about any limitations on activity for underlying diseases you may have. SEEK MEDICAL CARE IF:   Your condition is not improving after your  transfusion.  You develop redness or irritation at the intravenous (IV) site. SEEK IMMEDIATE MEDICAL CARE IF:  Any of the following symptoms occur over the next 12 hours:  Shaking chills.  You have a temperature by mouth above 102 F (38.9 C), not controlled by medicine.  Chest, back, or muscle pain.  People around you feel you are not acting correctly or are confused.  Shortness of breath or difficulty breathing.  Dizziness and fainting.  You get a rash or develop hives.  You have a decrease in urine output.  Your urine turns a dark color or changes to pink, red, or brown. Any of the following symptoms occur over the next 10 days:  You have a temperature by mouth above 102 F (38.9 C), not controlled by medicine.  Shortness of breath.  Weakness after normal activity.  The white part of the eye turns yellow (jaundice).  You have a decrease in the amount of urine or are urinating less often.  Your urine turns a dark color or changes to pink, red, or brown. Document Released: 12/14/2000 Document Revised: 03/10/2012 Document Reviewed: 08/02/2008 ExitCare Patient Information 2014 ExitCare, [place].  _______________________________________________________________________   CLEAR LIQUID DIET   Foods Allowed                                                                     Foods Excluded  Coffee and tea, regular and decaf                             liquids that you cannot  Plain Jell-O in any flavor                                             see through such as: Fruit ices (not with fruit pulp)                                     milk, soups, orange juice  Iced Popsicles                                    All solid food Carbonated beverages, regular and diet  Cranberry, grape and apple juices Sports drinks like Gatorade Lightly seasoned clear broth or consume(fat free) Sugar, honey syrup  Sample Menu Breakfast                                 Lunch                                     Supper Cranberry juice                    Beef broth                            Chicken broth Jell-O                                     Grape juice                           Apple juice Coffee or tea                        Jell-O                                      Popsicle                                                Coffee or tea                        Coffee or tea  _____________________________________________________________________

## 2016-07-12 ENCOUNTER — Encounter (HOSPITAL_COMMUNITY)
Admission: RE | Admit: 2016-07-12 | Discharge: 2016-07-12 | Disposition: A | Discharge status: Home / Self Care | Payer: Medicaid Other | Source: Ambulatory Visit | Attending: Orthopaedic Surgery | Admitting: Orthopaedic Surgery

## 2016-07-12 ENCOUNTER — Encounter (HOSPITAL_COMMUNITY): Payer: Self-pay

## 2016-07-12 ENCOUNTER — Telehealth: Payer: Self-pay | Admitting: *Deleted

## 2016-07-12 DIAGNOSIS — Z01812 Encounter for preprocedural laboratory examination: Secondary | ICD-10-CM | POA: Diagnosis not present

## 2016-07-12 LAB — BASIC METABOLIC PANEL
ANION GAP: 5 (ref 5–15)
BUN: 13 mg/dL (ref 6–20)
CHLORIDE: 105 mmol/L (ref 101–111)
CO2: 27 mmol/L (ref 22–32)
Calcium: 9.6 mg/dL (ref 8.9–10.3)
Creatinine, Ser: 1.17 mg/dL (ref 0.61–1.24)
GFR calc Af Amer: >60 mL/min (ref >60)
Glucose, Bld: 94 mg/dL (ref 65–99)
POTASSIUM: 5.1 mmol/L (ref 3.5–5.1)
SODIUM: 137 mmol/L (ref 135–145)

## 2016-07-12 LAB — SURGICAL PCR SCREEN
MRSA, PCR: NEGATIVE
Staphylococcus aureus: NEGATIVE

## 2016-07-12 LAB — CBC
HCT: 39.2 % (ref 39.0–52.0)
HEMOGLOBIN: 12.5 g/dL — AB (ref 13.0–17.0)
MCH: 29.8 pg (ref 26.0–34.0)
MCHC: 31.9 g/dL (ref 30.0–36.0)
MCV: 93.6 fL (ref 78.0–100.0)
PLATELETS: 329 10*3/uL (ref 150–400)
RBC: 4.19 MIL/uL — AB (ref 4.22–5.81)
RDW: 15.2 % (ref 11.5–15.5)
WBC: 5.9 10*3/uL (ref 4.0–10.5)

## 2016-07-12 NOTE — Progress Notes (Signed)
05/15/16-noted EKG and CXR 2 view in EPIC 12/28/15-noted in EPIC- 2D ECHO

## 2016-07-12 NOTE — Telephone Encounter (Signed)
Spoke with Steve Vang Nurse with [facility] phone [phone] who did check his INR today as she was making visit and  his  INR  was 2.6 and she states they have their instructions regarding Lovenox and understand how to inject Lovenox and understand instructions . Jennifer instructed to call Misty Stanley  Nurse in [place] to be sure of date to recheck his INR after surgery and she states understanding

## 2016-07-13 NOTE — Progress Notes (Signed)
Spoke with paitent's wife and made aware of time change .  Surgery time now 1615pm.  Patient needs to arrive in Short Stay at 1415pm.  May have clear liquids from 12 midnite until 0930am then nothing by mouth.  Patient's wife voice understanding.Marland Kitchen

## 2016-07-20 ENCOUNTER — Encounter (HOSPITAL_COMMUNITY)
Admission: RE | Disposition: A | Discharge status: Home Health | Payer: Self-pay | Source: Ambulatory Visit | Attending: Orthopaedic Surgery

## 2016-07-20 ENCOUNTER — Inpatient Hospital Stay (HOSPITAL_COMMUNITY): Payer: Medicaid Other | Admitting: Anesthesiology

## 2016-07-20 ENCOUNTER — Inpatient Hospital Stay (HOSPITAL_COMMUNITY): Payer: Medicaid Other

## 2016-07-20 ENCOUNTER — Inpatient Hospital Stay (HOSPITAL_COMMUNITY)
Admission: RE | Admit: 2016-07-20 | Discharge: 2016-07-21 | DRG: 470 | Disposition: A | Discharge status: Home Health | Payer: Medicaid Other | Source: Ambulatory Visit | Attending: Orthopaedic Surgery | Admitting: Orthopaedic Surgery

## 2016-07-20 ENCOUNTER — Encounter (HOSPITAL_COMMUNITY): Payer: Self-pay

## 2016-07-20 DIAGNOSIS — Z96642 Presence of left artificial hip joint: Secondary | ICD-10-CM | POA: Diagnosis present

## 2016-07-20 DIAGNOSIS — F17210 Nicotine dependence, cigarettes, uncomplicated: Secondary | ICD-10-CM | POA: Diagnosis present

## 2016-07-20 DIAGNOSIS — Z8673 Personal history of transient ischemic attack (TIA), and cerebral infarction without residual deficits: Secondary | ICD-10-CM

## 2016-07-20 DIAGNOSIS — I739 Peripheral vascular disease, unspecified: Secondary | ICD-10-CM | POA: Diagnosis present

## 2016-07-20 DIAGNOSIS — M87051 Idiopathic aseptic necrosis of right femur: Secondary | ICD-10-CM | POA: Diagnosis present

## 2016-07-20 DIAGNOSIS — I2510 Atherosclerotic heart disease of native coronary artery without angina pectoris: Secondary | ICD-10-CM | POA: Diagnosis present

## 2016-07-20 DIAGNOSIS — Z7901 Long term (current) use of anticoagulants: Secondary | ICD-10-CM

## 2016-07-20 DIAGNOSIS — I129 Hypertensive chronic kidney disease with stage 1 through stage 4 chronic kidney disease, or unspecified chronic kidney disease: Secondary | ICD-10-CM | POA: Diagnosis present

## 2016-07-20 DIAGNOSIS — N182 Chronic kidney disease, stage 2 (mild): Secondary | ICD-10-CM | POA: Diagnosis present

## 2016-07-20 DIAGNOSIS — Z8249 Family history of ischemic heart disease and other diseases of the circulatory system: Secondary | ICD-10-CM

## 2016-07-20 DIAGNOSIS — M25551 Pain in right hip: Secondary | ICD-10-CM | POA: Diagnosis present

## 2016-07-20 DIAGNOSIS — Z951 Presence of aortocoronary bypass graft: Secondary | ICD-10-CM | POA: Diagnosis not present

## 2016-07-20 DIAGNOSIS — Z96641 Presence of right artificial hip joint: Secondary | ICD-10-CM

## 2016-07-20 HISTORY — PX: TOTAL HIP ARTHROPLASTY: SHX124

## 2016-07-20 LAB — TYPE AND SCREEN
ABO/RH(D): B POS
ANTIBODY SCREEN: NEGATIVE

## 2016-07-20 LAB — PROTIME-INR
INR: 0.92 (ref 0.00–1.49)
PROTHROMBIN TIME: 12.6 s (ref 11.6–15.2)

## 2016-07-20 SURGERY — ARTHROPLASTY, HIP, TOTAL, ANTERIOR APPROACH
Anesthesia: Monitor Anesthesia Care | Site: Hip | Laterality: Right

## 2016-07-20 MED ORDER — LIDOCAINE HCL (CARDIAC) 20 MG/ML IV SOLN
INTRAVENOUS | Status: DC | PRN
  Administered 2016-07-20: 100 mg via INTRAVENOUS

## 2016-07-20 MED ORDER — PROMETHAZINE HCL 25 MG/ML IJ SOLN
6.2500 mg | INTRAMUSCULAR | Status: DC | PRN
Start: 2016-07-20 — End: 2016-07-20

## 2016-07-20 MED ORDER — MIDAZOLAM HCL 5 MG/5ML IJ SOLN
INTRAMUSCULAR | Status: DC | PRN
  Administered 2016-07-20 (×2): 1 mg via INTRAVENOUS
  Administered 2016-07-20: 2 mg via INTRAVENOUS

## 2016-07-20 MED ORDER — TRANEXAMIC ACID 1000 MG/10ML IV SOLN
1000.0000 mg | INTRAVENOUS | Status: DC
  Filled 2016-07-20: qty 10

## 2016-07-20 MED ORDER — SODIUM CHLORIDE 0.9 % IV SOLN
INTRAVENOUS | Status: DC
  Administered 2016-07-20: 21:00:00 via INTRAVENOUS

## 2016-07-20 MED ORDER — PROPOFOL 10 MG/ML IV BOLUS
INTRAVENOUS | Status: DC | PRN
  Administered 2016-07-20: 20 mg via INTRAVENOUS
  Administered 2016-07-20: 30 mg via INTRAVENOUS

## 2016-07-20 MED ORDER — CEFAZOLIN IN D5W 1 GM/50ML IV SOLN
1.0000 g | Freq: Four times a day (QID) | INTRAVENOUS | Status: AC
  Administered 2016-07-20 – 2016-07-21 (×2): 1 g via INTRAVENOUS
  Filled 2016-07-20 (×2): qty 50

## 2016-07-20 MED ORDER — MIDAZOLAM HCL 2 MG/2ML IJ SOLN
INTRAMUSCULAR | Status: AC
  Filled 2016-07-20: qty 2

## 2016-07-20 MED ORDER — HYDROMORPHONE HCL 2 MG PO TABS
2.0000 mg | ORAL_TABLET | ORAL | Status: DC | PRN
  Administered 2016-07-20 – 2016-07-21 (×2): 2 mg via ORAL
  Filled 2016-07-20 (×2): qty 1

## 2016-07-20 MED ORDER — TRANEXAMIC ACID 1000 MG/10ML IV SOLN
2000.0000 mg | INTRAVENOUS | Status: AC
  Administered 2016-07-20: 2000 mg via TOPICAL
  Filled 2016-07-20: qty 20

## 2016-07-20 MED ORDER — FENTANYL CITRATE (PF) 100 MCG/2ML IJ SOLN
INTRAMUSCULAR | Status: AC
  Filled 2016-07-20: qty 2

## 2016-07-20 MED ORDER — MENTHOL 3 MG MT LOZG: 1.0000 | LOZENGE | OROMUCOSAL | Status: DC | PRN

## 2016-07-20 MED ORDER — METHOCARBAMOL 1000 MG/10ML IJ SOLN
500.0000 mg | Freq: Four times a day (QID) | INTRAMUSCULAR | Status: DC | PRN
  Filled 2016-07-20: qty 5

## 2016-07-20 MED ORDER — PROPOFOL 10 MG/ML IV BOLUS
INTRAVENOUS | Status: AC
  Filled 2016-07-20: qty 20

## 2016-07-20 MED ORDER — NITROGLYCERIN 0.4 MG SL SUBL: 0.4000 mg | SUBLINGUAL_TABLET | SUBLINGUAL | Status: DC | PRN

## 2016-07-20 MED ORDER — ALUM & MAG HYDROXIDE-SIMETH 200-200-20 MG/5ML PO SUSP: 30.0000 mL | ORAL | Status: DC | PRN

## 2016-07-20 MED ORDER — ASPIRIN EC 81 MG PO TBEC
81.0000 mg | DELAYED_RELEASE_TABLET | Freq: Every day | ORAL | Status: DC
  Administered 2016-07-21: 81 mg via ORAL
  Filled 2016-07-20 (×2): qty 1

## 2016-07-20 MED ORDER — SODIUM CHLORIDE 0.9 % IR SOLN
Status: DC | PRN
Start: 2016-07-20 — End: 2016-07-20
  Administered 2016-07-20: 2000 mL

## 2016-07-20 MED ORDER — WARFARIN - PHARMACIST DOSING INPATIENT: Freq: Every day | Status: DC

## 2016-07-20 MED ORDER — DEXAMETHASONE SODIUM PHOSPHATE 10 MG/ML IJ SOLN
INTRAMUSCULAR | Status: AC
  Filled 2016-07-20: qty 1

## 2016-07-20 MED ORDER — ONDANSETRON HCL 4 MG/2ML IJ SOLN: 4.0000 mg | Freq: Four times a day (QID) | INTRAMUSCULAR | Status: DC | PRN

## 2016-07-20 MED ORDER — ONDANSETRON HCL 4 MG/2ML IJ SOLN
INTRAMUSCULAR | Status: DC | PRN
  Administered 2016-07-20: 4 mg via INTRAVENOUS

## 2016-07-20 MED ORDER — WARFARIN SODIUM 5 MG PO TABS
10.0000 mg | ORAL_TABLET | Freq: Once | ORAL | Status: AC
  Administered 2016-07-20: 10 mg via ORAL
  Filled 2016-07-20: qty 2

## 2016-07-20 MED ORDER — CEFAZOLIN SODIUM-DEXTROSE 2-4 GM/100ML-% IV SOLN
INTRAVENOUS | Status: AC
  Filled 2016-07-20: qty 100

## 2016-07-20 MED ORDER — PROPOFOL 10 MG/ML IV BOLUS
INTRAVENOUS | Status: AC
  Filled 2016-07-20: qty 60

## 2016-07-20 MED ORDER — FENTANYL CITRATE (PF) 100 MCG/2ML IJ SOLN
INTRAMUSCULAR | Status: DC | PRN
  Administered 2016-07-20 (×2): 100 ug via INTRAVENOUS

## 2016-07-20 MED ORDER — OXYCODONE HCL 5 MG PO TABS
5.0000 mg | ORAL_TABLET | ORAL | Status: DC | PRN
  Administered 2016-07-20 – 2016-07-21 (×3): 10 mg via ORAL
  Filled 2016-07-20 (×4): qty 2

## 2016-07-20 MED ORDER — LIDOCAINE HCL (CARDIAC) 20 MG/ML IV SOLN
INTRAVENOUS | Status: AC
  Filled 2016-07-20: qty 5

## 2016-07-20 MED ORDER — CEFAZOLIN SODIUM-DEXTROSE 2-4 GM/100ML-% IV SOLN
2.0000 g | INTRAVENOUS | Status: AC
  Administered 2016-07-20: 2 g via INTRAVENOUS
  Filled 2016-07-20: qty 100

## 2016-07-20 MED ORDER — HYDROMORPHONE HCL 1 MG/ML IJ SOLN: 0.2500 mg | INTRAMUSCULAR | Status: DC | PRN

## 2016-07-20 MED ORDER — CITALOPRAM HYDROBROMIDE 20 MG PO TABS
20.0000 mg | ORAL_TABLET | Freq: Every evening | ORAL | Status: DC
  Administered 2016-07-20: 20 mg via ORAL
  Filled 2016-07-20: qty 1

## 2016-07-20 MED ORDER — METHOCARBAMOL 500 MG PO TABS
500.0000 mg | ORAL_TABLET | Freq: Four times a day (QID) | ORAL | Status: DC | PRN
  Administered 2016-07-20 – 2016-07-21 (×2): 500 mg via ORAL
  Filled 2016-07-20 (×2): qty 1

## 2016-07-20 MED ORDER — METOPROLOL TARTRATE 25 MG PO TABS
25.0000 mg | ORAL_TABLET | Freq: Two times a day (BID) | ORAL | Status: DC
  Administered 2016-07-20 – 2016-07-21 (×2): 25 mg via ORAL
  Filled 2016-07-20 (×2): qty 1

## 2016-07-20 MED ORDER — HYDROMORPHONE HCL 1 MG/ML IJ SOLN: 1.0000 mg | INTRAMUSCULAR | Status: DC | PRN

## 2016-07-20 MED ORDER — ACETAMINOPHEN 650 MG RE SUPP
650.0000 mg | Freq: Four times a day (QID) | RECTAL | Status: DC | PRN
Start: 2016-07-20 — End: 2016-07-21

## 2016-07-20 MED ORDER — ACETAMINOPHEN 325 MG PO TABS: 650.0000 mg | ORAL_TABLET | Freq: Four times a day (QID) | ORAL | Status: DC | PRN

## 2016-07-20 MED ORDER — ZOLPIDEM TARTRATE 5 MG PO TABS: 5.0000 mg | ORAL_TABLET | Freq: Every evening | ORAL | Status: DC | PRN

## 2016-07-20 MED ORDER — LACTATED RINGERS IV SOLN
INTRAVENOUS | Status: DC
  Administered 2016-07-20 (×3): via INTRAVENOUS

## 2016-07-20 MED ORDER — BUPIVACAINE IN DEXTROSE 0.75-8.25 % IT SOLN
INTRATHECAL | Status: DC | PRN
  Administered 2016-07-20: 15 mg via INTRATHECAL

## 2016-07-20 MED ORDER — PHENYLEPHRINE HCL 10 MG/ML IJ SOLN
INTRAMUSCULAR | Status: AC
  Filled 2016-07-20: qty 1

## 2016-07-20 MED ORDER — SIMVASTATIN 20 MG PO TABS
20.0000 mg | ORAL_TABLET | Freq: Every day | ORAL | Status: DC
  Administered 2016-07-20: 20 mg via ORAL
  Filled 2016-07-20 (×2): qty 1

## 2016-07-20 MED ORDER — ONDANSETRON HCL 4 MG PO TABS: 4.0000 mg | ORAL_TABLET | Freq: Four times a day (QID) | ORAL | Status: DC | PRN

## 2016-07-20 MED ORDER — METOCLOPRAMIDE HCL 5 MG/ML IJ SOLN: 5.0000 mg | Freq: Three times a day (TID) | INTRAMUSCULAR | Status: DC | PRN

## 2016-07-20 MED ORDER — DEXAMETHASONE SODIUM PHOSPHATE 10 MG/ML IJ SOLN
INTRAMUSCULAR | Status: DC | PRN
  Administered 2016-07-20: 10 mg via INTRAVENOUS

## 2016-07-20 MED ORDER — METOCLOPRAMIDE HCL 5 MG PO TABS: 5.0000 mg | ORAL_TABLET | Freq: Three times a day (TID) | ORAL | Status: DC | PRN

## 2016-07-20 MED ORDER — PROPOFOL 500 MG/50ML IV EMUL
INTRAVENOUS | Status: DC | PRN
  Administered 2016-07-20: 100 ug/kg/min via INTRAVENOUS

## 2016-07-20 MED ORDER — CHLORHEXIDINE GLUCONATE 4 % EX LIQD: 60.0000 mL | Freq: Once | CUTANEOUS | Status: DC

## 2016-07-20 MED ORDER — PHENOL 1.4 % MT LIQD
1.0000 | OROMUCOSAL | Status: DC | PRN
  Filled 2016-07-20: qty 177

## 2016-07-20 MED ORDER — DOCUSATE SODIUM 100 MG PO CAPS
100.0000 mg | ORAL_CAPSULE | Freq: Two times a day (BID) | ORAL | Status: DC
  Administered 2016-07-21: 100 mg via ORAL
  Filled 2016-07-20 (×2): qty 1

## 2016-07-20 SURGICAL SUPPLY — 37 items
BAG ZIPLOCK 12X15 (MISCELLANEOUS) IMPLANT
BENZOIN TINCTURE PRP APPL 2/3 (GAUZE/BANDAGES/DRESSINGS) ×3 IMPLANT
BLADE SAW SGTL 18X1.27X75 (BLADE) ×2 IMPLANT
BLADE SAW SGTL 18X1.27X75MM (BLADE) ×1
CAPT HIP TOTAL 2 ×3 IMPLANT
CELLS DAT CNTRL 66122 CELL SVR (MISCELLANEOUS) ×1 IMPLANT
CLOSURE WOUND 1/2 X4 (GAUZE/BANDAGES/DRESSINGS) ×1
CLOTH BEACON ORANGE TIMEOUT ST (SAFETY) ×3 IMPLANT
DRAPE STERI IOBAN 125X83 (DRAPES) ×3 IMPLANT
DRAPE U-SHAPE 47X51 STRL (DRAPES) ×6 IMPLANT
DRSG AQUACEL AG ADV 3.5X10 (GAUZE/BANDAGES/DRESSINGS) ×3 IMPLANT
DURAPREP 26ML APPLICATOR (WOUND CARE) ×3 IMPLANT
ELECT REM PT RETURN 9FT ADLT (ELECTROSURGICAL) ×3
ELECTRODE REM PT RTRN 9FT ADLT (ELECTROSURGICAL) ×1 IMPLANT
GAUZE XEROFORM 1X8 LF (GAUZE/BANDAGES/DRESSINGS) IMPLANT
GLOVE BIO SURGEON STRL SZ7.5 (GLOVE) ×3 IMPLANT
GLOVE BIOGEL PI IND STRL 8 (GLOVE) ×2 IMPLANT
GLOVE BIOGEL PI INDICATOR 8 (GLOVE) ×4
GLOVE ECLIPSE 8.0 STRL XLNG CF (GLOVE) ×3 IMPLANT
GOWN STRL REUS W/TWL XL LVL3 (GOWN DISPOSABLE) ×6 IMPLANT
HANDPIECE INTERPULSE COAX TIP (DISPOSABLE) ×2
HOLDER FOLEY CATH W/STRAP (MISCELLANEOUS) ×3 IMPLANT
PACK ANTERIOR HIP CUSTOM (KITS) ×3 IMPLANT
RTRCTR WOUND ALEXIS 18CM MED (MISCELLANEOUS) ×3
SET HNDPC FAN SPRY TIP SCT (DISPOSABLE) ×1 IMPLANT
STAPLER VISISTAT 35W (STAPLE) IMPLANT
STRIP CLOSURE SKIN 1/2X4 (GAUZE/BANDAGES/DRESSINGS) ×2 IMPLANT
SUT ETHIBOND NAB CT1 #1 30IN (SUTURE) ×3 IMPLANT
SUT MNCRL AB 4-0 PS2 18 (SUTURE) IMPLANT
SUT VIC AB 0 CT1 36 (SUTURE) ×3 IMPLANT
SUT VIC AB 1 CT1 36 (SUTURE) ×3 IMPLANT
SUT VIC AB 2-0 CT1 27 (SUTURE) ×4
SUT VIC AB 2-0 CT1 TAPERPNT 27 (SUTURE) ×2 IMPLANT
SYR 50ML LL SCALE MARK (SYRINGE) ×3 IMPLANT
TRAY FOLEY W/METER SILVER 14FR (SET/KITS/TRAYS/PACK) IMPLANT
TRAY FOLEY W/METER SILVER 16FR (SET/KITS/TRAYS/PACK) ×3 IMPLANT
YANKAUER SUCT BULB TIP NO VENT (SUCTIONS) ×3 IMPLANT

## 2016-07-20 NOTE — Anesthesia Procedure Notes (Signed)
Spinal Patient location during procedure: OR Start time: 07/20/2016 5:08 PM End time: 07/20/2016 5:27 PM Staffing Anesthesiologist: Heather Roberts Performed by: anesthesiologist  Preanesthetic Checklist Completed: patient identified, surgical consent, pre-op evaluation, timeout performed, IV checked, risks and benefits discussed and monitors and equipment checked Spinal Block Patient position: sitting Prep: DuraPrep Patient monitoring: cardiac monitor, continuous pulse ox and blood pressure Approach: midline Location: L2-3 Injection technique: single-shot Needle Needle type: Pencan  Needle gauge: 24 G Needle length: 9 cm Additional Notes Functioning IV was confirmed and monitors were applied. Sterile prep and drape, including hand hygiene and sterile gloves were used. The patient was positioned and the spine was prepped. The skin was anesthetized with lidocaine.  Free flow of clear CSF was obtained prior to injecting local anesthetic into the CSF.  The spinal needle aspirated freely following injection.  The needle was carefully withdrawn.  The patient tolerated the procedure well.  Difficult placement requiring several attempts.

## 2016-07-20 NOTE — H&P (Signed)
TOTAL HIP ADMISSION H&P  Patient is admitted for right total hip arthroplasty.  Subjective:  Chief Complaint: right hip pain  HPI: Steve Vang, 50 y.o. male, has a history of pain and functional disability in the right hip(s) due to idiopathic avascular necrosis and patient has failed non-surgical conservative treatments for greater than 12 weeks to include NSAID's and/or analgesics, corticosteriod injections, flexibility and strengthening excercises, supervised PT with diminished ADL's post treatment, use of assistive devices and activity modification.  Onset of symptoms was abrupt starting 2 years ago with rapidlly worsening course since that time.The patient noted no past surgery on the right hip(s).  Patient currently rates pain in the right hip at 10 out of 10 with activity. Patient has night pain, worsening of pain with activity and weight bearing, trendelenberg gait, pain that interfers with activities of daily living and pain with passive range of motion. Patient has evidence of subchondral cysts, joint space narrowing and femoral head collapse by imaging studies. This condition presents safety issues increasing the risk of falls.  There is no current active infection.  Patient Active Problem List   Diagnosis Date Noted  . Avascular necrosis of bone of right hip (HCC) 07/20/2016  . Avascular necrosis of bones of both hips (HCC) 06/08/2016  . Status post total replacement of left hip 06/08/2016  . AKI (acute kidney injury) (HCC) 05/15/2016  . Nausea and vomiting 05/15/2016  . Abdominal pain 05/15/2016  . Chest pain 05/15/2016  . Emesis   . Encounter for therapeutic drug monitoring 03/03/2014  . Long term (current) use of anticoagulants 04/16/2011  . GAIT IMBALANCE 02/02/2011  . ABNORMAL CV (STRESS) TEST 10/02/2010  . Mural thrombus of left ventricle (HCC) 09/08/2010  . OTHER SPEC FORMS CHRONIC ISCHEMIC HEART DISEASE 06/07/2010  . Mixed hyperlipidemia 03/02/2010  . TOBACCO ABUSE  03/02/2010  . Essential hypertension, benign 03/02/2010  . CORONARY ATHEROSCLEROSIS NATIVE CORONARY ARTERY 03/02/2010  . CVA 03/01/2010   Past Medical History  Diagnosis Date  . CAD (coronary artery disease)     Multivessel, LVEF 45-50%  . History of stroke 2004  . Hyperlipidemia   . Essential hypertension   . Left ventricular mural thrombus (HCC)     On Coumadin  . Depression   . Falls   . CKD (chronic kidney disease) stage 2, GFR 60-89 ml/min   . Gallstone   . Cardiomyopathy (HCC)     LVEF 50-55% December 2016  . Gastroenteritis   . COPD (chronic obstructive pulmonary disease) [facility])     Past Surgical History  Procedure Laterality Date  . Coronary artery bypass graft      DOR anterior ventricular restoration surgery 8/06, [facility] - LIMA to first diagonal, SVG to PLB, SVG to RVE branch of nondominant RCA  . Eye surgery    . Total hip arthroplasty Left 06/08/2016    Procedure: TOTAL HIP ARTHROPLASTY ANTERIOR APPROACH;  Surgeon: Kathryne Hitch, MD;  Location: [facility] ORS;  Service: Orthopedics;  Laterality: Left;    No prescriptions prior to admission   Allergies  Allergen Reactions  . Benadryl [Diphenhydramine] Hives and Swelling  . Chantix [Varenicline] Nausea And Vomiting    Social History  Substance Use Topics  . Smoking status: Current Every Day Smoker -- 0.25 packs/day for 30 years    Types: Cigarettes  . Smokeless tobacco: Never Used  . Alcohol Use: No     Comment: hx of alcohol use     Family History  Problem Relation Age  of Onset  . Hypertension Mother   . Diabetes Mother      Review of Systems  Musculoskeletal: Positive for joint pain.  All other systems reviewed and are negative.   Objective:  Physical Exam  Constitutional: He is oriented to person, place, and time. He appears well-developed and well-nourished.  HENT:  Head: Normocephalic and atraumatic.  Eyes: EOM are normal. Pupils are equal, round, and reactive to light.  Neck:  Normal range of motion. Neck supple.  Cardiovascular: Normal rate and regular rhythm.   Respiratory: Effort normal and breath sounds normal.  GI: Soft. Bowel sounds are normal.  Musculoskeletal:       Right hip: He exhibits decreased range of motion, decreased strength, tenderness, bony tenderness and swelling.  Neurological: He is alert and oriented to person, place, and time.  Skin: Skin is warm and dry.  Psychiatric: He has a normal mood and affect.    Vital signs in last 24 hours:    Labs:   Estimated body mass index is 24.11 kg/(m^2) as calculated from the following:   Height as of 06/08/16:  (1.702 m).   Weight as of 06/05/16: 69.854 kg (154 lb).   Imaging Review Plain radiographs demonstrate severe avascular necrosis of the right hip(s). The bone quality appears to be good for age and reported activity level.  Assessment/Plan:  End stage AVN, right hip(s)  The patient history, physical examination, clinical judgement of the provider and imaging studies are consistent with end stage AVN of the right hip(s) and total hip arthroplasty is deemed medically necessary. The treatment options including medical management, injection therapy, arthroscopy and arthroplasty were discussed at length. The risks and benefits of total hip arthroplasty were presented and reviewed. The risks due to aseptic loosening, infection, stiffness, dislocation/subluxation,  thromboembolic complications and other imponderables were discussed.  The patient acknowledged the explanation, agreed to proceed with the plan and consent was signed. Patient is being admitted for inpatient treatment for surgery, pain control, PT, OT, prophylactic antibiotics, VTE prophylaxis, progressive ambulation and ADL's and discharge planning.The patient is planning to be discharged home with home health services

## 2016-07-20 NOTE — Anesthesia Postprocedure Evaluation (Signed)
Anesthesia Post Note  Patient: Steve Vang  Procedure(s) Performed: Procedure(s) (LRB): RIGHT TOTAL HIP ARTHROPLASTY ANTERIOR APPROACH (Right)  Patient location during evaluation: PACU Anesthesia Type: Spinal and MAC Level of consciousness: awake and alert Pain management: pain level controlled Vital Signs Assessment: post-procedure vital signs reviewed and stable Respiratory status: spontaneous breathing and respiratory function stable Cardiovascular status: blood pressure returned to baseline and stable Postop Assessment: spinal receding Anesthetic complications: no    Last Vitals:  Filed Vitals:   07/20/16 1930 07/20/16 1950  BP: 102/72 126/75  Pulse: 61 52  Temp: 36.3 C 36.5 C  Resp: 13 14    Last Pain:  Filed Vitals:   07/20/16 1954  PainSc: 2                  [name] DANIEL

## 2016-07-20 NOTE — Anesthesia Preprocedure Evaluation (Addendum)
Anesthesia Evaluation  Patient identified by MRN, date of birth, ID band Patient awake    Reviewed: Allergy & Precautions, NPO status , Patient's Chart, lab work & pertinent test results  History of Anesthesia Complications Negative for: history of anesthetic complications  Airway Mallampati: II  TM Distance: >3 FB Neck ROM: Full    Dental  (+) Edentulous Upper, Edentulous Lower   Pulmonary neg shortness of breath, neg sleep apnea, COPD, neg recent URI, Current Smoker,    breath sounds clear to auscultation       Cardiovascular hypertension, Pt. on medications and Pt. on home beta blockers (-) angina+ CAD, + CABG and + Peripheral Vascular Disease   Rhythm:Regular  Reviewed report. LVEF low normal range at 50-55%. There was no obvious LV mural thrombus described although he has wall motion abnormalities at the LV apex as before, and a previous history of LV mural thrombus. As it relates to preoperative cardiac evaluation, he does not need any further testing.   Neuro/Psych PSYCHIATRIC DISORDERS Depression CVA, No Residual Symptoms    GI/Hepatic negative GI ROS, Neg liver ROS,   Endo/Other  negative endocrine ROS  Renal/GU Renal InsufficiencyRenal diseasenegative Renal ROS     Musculoskeletal   Abdominal   Peds  Hematology negative hematology ROS (+)   Anesthesia Other Findings   Reproductive/Obstetrics                         Anesthesia Physical  Anesthesia Plan  ASA: III  Anesthesia Plan: MAC and Spinal   Post-op Pain Management:    Induction: Intravenous  Airway Management Planned: Natural Airway, Nasal Cannula and Simple Face Mask  Additional Equipment: None  Intra-op Plan:   Post-operative Plan:   Informed Consent: I have reviewed the patients History and Physical, chart, labs and discussed the procedure including the risks, benefits and alternatives for the proposed anesthesia  with the patient or authorized representative who has indicated his/her understanding and acceptance.   Dental advisory given  Plan Discussed with: CRNA and Surgeon  Anesthesia Plan Comments:         Anesthesia Quick Evaluation

## 2016-07-20 NOTE — Brief Op Note (Signed)
07/20/2016  6:38 PM  PATIENT:  Steve Vang  50 y.o. male  PRE-OPERATIVE DIAGNOSIS:  avascular necrosis right hip  POST-OPERATIVE DIAGNOSIS:  avascular necrosis right hip  PROCEDURE:  Procedure(s): RIGHT TOTAL HIP ARTHROPLASTY ANTERIOR APPROACH (Right)  SURGEON:  Surgeon(s) and Role:    * Kathryne Hitch, MD - Primary  PHYSICIAN ASSISTANT: Rexene Edison, PA-C  ANESTHESIA:   spinal  EBL:  Total I/O In: 1000 [I.V.:1000] Out: 550 [Urine:450; Blood:100]  COUNTS:  YES  DICTATION: .Other Dictation: Dictation Number [id number]  PLAN OF CARE: Admit to inpatient   PATIENT DISPOSITION:  PACU - hemodynamically stable.   Delay start of Pharmacological VTE agent (>24hrs) due to surgical blood loss or risk of bleeding: no

## 2016-07-20 NOTE — Progress Notes (Addendum)
ANTICOAGULATION CONSULT NOTE - Initial Consult  Pharmacy Consult for Warfarin Indication: mural thrombus of left ventricle, VTE prophylaxis post-op  Allergies  Allergen Reactions  . Benadryl [Diphenhydramine] Hives and Swelling  . Chantix [Varenicline] Nausea And Vomiting    Patient Measurements: Height: 5\' 7"  (170.2 cm) Weight: 153 lb (69.4 kg) IBW/kg (Calculated) : 66.1  Vital Signs: Temp: 97.6 F (36.4 C) (07/21 2050) Temp Source: Oral (07/21 2050) BP: 149/81 mmHg (07/21 2050) Pulse Rate: 56 (07/21 2050)  Labs:  Recent Labs  07/20/16 1330  LABPROT 12.6  INR 0.92    Estimated Creatinine Clearance: 70.6 mL/min (by C-G formula based on Cr of 1.17).   Medical History: Past Medical History  Diagnosis Date  . CAD (coronary artery disease)     Multivessel, LVEF 45-50%  . History of stroke 2004  . Hyperlipidemia   . Essential hypertension   . Left ventricular mural thrombus (HCC)     On Coumadin  . Depression   . Falls   . CKD (chronic kidney disease) stage 2, GFR 60-89 ml/min   . Gallstone   . Cardiomyopathy (HCC)     LVEF 50-55% December 2016  . Gastroenteritis   . COPD (chronic obstructive pulmonary disease) (HCC)     Assessment: 44 y/oM with PMH of mural thrombus of left ventricle on chronic anticoagulation with warfarin who is now s/p right total hip arthroplasty. Pharmacy consulted to resume warfarin post-op. Patient's PTA dosage of Warfarin reported as 7.5 mg daily except 5 mg on Sunday and Thursday. Patient's last dose of warfarin was taken 7/15 and was subsequently bridged with Enoxaparin 100mg  SQ daily with last dose reported on 7/20. INR today = 0.92. CBC on 7/13 showed Hgb 12.5, Pltc WNL.   Goal of Therapy:  INR 2-3 Monitor platelets by anticoagulation protocol: Yes   Plan:  Warfarin 10mg  PO x 1 tonight. F/u plans to resume Enoxaparin for bridging. Daily PT/INR, CBC at least q72h (currently ordered daily x 3 days per MD). Monitor closely for  s/s of bleeding.   Greer Pickerel, PharmD, BCPS Pager: [phone] 07/20/2016 10:03 PM

## 2016-07-20 NOTE — Transfer of Care (Signed)
Immediate Anesthesia Transfer of Care Note  Patient: Steve Vang  Procedure(s) Performed: Procedure(s): RIGHT TOTAL HIP ARTHROPLASTY ANTERIOR APPROACH (Right)  Patient Location: PACU  Anesthesia Type:Spinal  Level of Consciousness: awake, alert , oriented and pateint uncooperative  Airway & Oxygen Therapy: Patient Spontanous Breathing and Patient connected to nasal cannula oxygen  Post-op Assessment: Report given to RN, Post -op Vital signs reviewed and stable and Patient moving all extremities X 4  Post vital signs: stable  Last Vitals:  Filed Vitals:   07/20/16 1304 07/20/16 1900  BP: 142/89 112/70  Pulse: 62 65  Temp: 37.2 C 36.3 C  Resp: 16 15    Last Pain:  Filed Vitals:   07/20/16 1908  PainSc: 2          Complications: No apparent anesthesia complications Woke up swinging and hit Gill PA, very agitated, not cooprating  Versed 2mg  given and Fentanyln given  With good results

## 2016-07-21 LAB — CBC
HCT: 34.9 % — ABNORMAL LOW (ref 39.0–52.0)
HEMOGLOBIN: 11.3 g/dL — AB (ref 13.0–17.0)
MCH: 29.8 pg (ref 26.0–34.0)
MCHC: 32.4 g/dL (ref 30.0–36.0)
MCV: 92.1 fL (ref 78.0–100.0)
Platelets: 252 10*3/uL (ref 150–400)
RBC: 3.79 MIL/uL — AB (ref 4.22–5.81)
RDW: 15.2 % (ref 11.5–15.5)
WBC: 13.1 10*3/uL — ABNORMAL HIGH (ref 4.0–10.5)

## 2016-07-21 LAB — BASIC METABOLIC PANEL
ANION GAP: 6 (ref 5–15)
BUN: 14 mg/dL (ref 6–20)
CHLORIDE: 103 mmol/L (ref 101–111)
CO2: 28 mmol/L (ref 22–32)
Calcium: 9.5 mg/dL (ref 8.9–10.3)
Creatinine, Ser: 0.98 mg/dL (ref 0.61–1.24)
GFR calc Af Amer: >60 mL/min (ref >60)
GFR calc non Af Amer: >60 mL/min (ref >60)
GLUCOSE: 258 mg/dL — AB (ref 65–99)
POTASSIUM: 4.4 mmol/L (ref 3.5–5.1)
SODIUM: 137 mmol/L (ref 135–145)

## 2016-07-21 LAB — PROTIME-INR
INR: 1.03 (ref 0.00–1.49)
PROTHROMBIN TIME: 13.7 s (ref 11.6–15.2)

## 2016-07-21 MED ORDER — HYDROMORPHONE HCL 2 MG PO TABS: 2.0000 mg | ORAL_TABLET | ORAL | Status: DC | PRN

## 2016-07-21 MED ORDER — LOVENOX 100 MG/ML ~~LOC~~ SOLN: 100.0000 mg | SUBCUTANEOUS | Status: DC

## 2016-07-21 MED ORDER — CYCLOBENZAPRINE HCL 10 MG PO TABS: 10.0000 mg | ORAL_TABLET | Freq: Three times a day (TID) | ORAL | Status: DC | PRN

## 2016-07-21 NOTE — Discharge Instructions (Signed)

## 2016-07-21 NOTE — Op Note (Signed)
NAMEDEPAUL, JACKOVICH                  ACCOUNT NO.:  [id number]  MEDICAL RECORD NO.:  [id number]  LOCATION:  1610                         FACILITY:  [facility]  PHYSICIAN:  Vanita Panda. Magnus Ivan, M.D.DATE OF BIRTH:  12-26-1966  DATE OF PROCEDURE:  07/20/2016 DATE OF DISCHARGE:                              OPERATIVE REPORT   PREOPERATIVE DIAGNOSIS:  End-stage avascular necrosis right hip.  POSTOPERATIVE DIAGNOSIS:  End-stage avascular necrosis right hip.  PROCEDURE:  Right total hip arthroplasty through direct anterior approach.  IMPLANTS:  DePuy Sector Gription acetabular component size 52, size 36+ 0 neutral polyethylene liner, size 12 Corail femoral component with varus offset (KLA), size 36+ 1.5 ceramic hip ball.  SURGEON:  Vanita Panda. Magnus Ivan, MD  ASSISTANT:  Richardean Canal, PA-C  ANESTHESIA:  Spinal.  ANTIBIOTICS:  2 g IV Ancef.  BLOOD LOSS:  100 mL.  COMPLICATIONS:  None.  INDICATIONS:  Mr. Wright is a 50 year old gentleman well known to me.  He has bilateral hip avascular necrosis and we had actually set him up in early June for bilateral hip replacements.  We were able to do the left hip at that time with a right hip we had to hold off on because he actually had a bug bite right on the line of our incision.  He understood then why we need to hold off on surgery, and we were able to do his left hip easily.  He is now presenting for his right hip.  He has end-stage avascular necrosis on the right hip.  The bug bite area is healed and gone away and comfortable with proceeding with surgery.  At this point, his pain is daily, it is 10/10.  It has detrimentally affected his activities of daily living, his mobility, and his quality of life.  He understands the risk of acute blood loss anemia, nerve and vessel injury, fracture, infection, dislocation, DVT with our goals of decreased pain, improved mobility, and overall improved quality of life.  PROCEDURE DESCRIPTION:   After informed consent was obtained, appropriate right hip was marked.  He was brought to the operating room, where spinal anesthesia was obtained while he was on the stretcher.  He was next placed supine on the Hana fracture table.  The perineal post in place and both legs in inline skeletal traction devices, but no traction applied.  A Foley catheter had been placed as well.  His right operative hip was then prepped and draped with DuraPrep and sterile drapes.  Time- out was called to identify correct patient, correct right hip.  I then made an incision inferior and posterior to the anterior superior iliac spine and carried this obliquely down the leg.  We dissected down the tensor fascia lata muscle.  Tensor fascia was then divided longitudinally, so we could proceed with direct anterior approach to the hip.  We identified, cauterized the lateral femoral circumflex vessels and then identified the hip capsule.  I opened up the hip capsule in an L-type format finding a very large joint effusion.  We placed Cobra retractors within the hip capsule and made our femoral neck cut with an oscillating saw proximal to the lesser trochanter and  completed this on the osteotome.  We placed a corkscrew guide in the femoral head and removed the femoral head in its entirety and found it to be collapsing devoid of cartilage with significant avascular necrosis.  We then cleaned the remnants of acetabular labrum from the acetabulum placed a bent Hohmann over the medial acetabular rim and then began reaming under direct visualization from a size 42 reamer up to a size 52 with all reamers under direct visualization and the last reamer under direct fluoroscopy, so we could obtain our depth of reaming, our inclination, and anteversion.  We then placed the real DePuy Sector Gription acetabular component size 52 and a 36+ 0 polyethylene liner for that size acetabular component.  Attention was then turned to the  femur. With the leg externally rotated to 120 degrees, extended and adducted, we were to place a Mueller retractor medially and a Hohmann retractor behind the greater trochanter.  We released lateral joint capsule and used a box cutting osteotome to enter the femoral canal and a rongeur to lateralize.  We then began broaching from a size 8 broach using the Corail broaching system up to a size 12.  With a size 12 in place, we trialed a varus offset femoral neck matching his other side at 36+ 1.5 hip ball.  We brought the leg back over and up with traction and internal rotation, reducing the pelvis.  We were pleased with the range of motion, offset, leg length, as well stability.  We then dislocated the hip and removed the trial components.  We were able to place the real Corail femoral component with varus offset size 12 and the real 36+ 1.5 hip ball, reduced this in the acetabulum again it was stable.  We copiously irrigated the soft tissue with normal saline solution using pulsatile lavage.  We closed the joint capsule with interrupted #1 Ethibond suture followed by running #1 Vicryl in the tensor fascia, 0 Vicryl in the deep tissue, 2-0 Vicryl in the subcutaneous tissue, 4-0 Monocryl subcuticular stitch, and Steri-Strips on the skin and an Aquacel dressing was applied.  He was taken to the recovery room in stable condition.  All final counts were correct.  There were no complications noted.  Of note, Richardean Canal, PA-C, assisted in this entire case.  His assistance was crucial for facilitating all aspects of this case.     Vanita Panda. Magnus Ivan, M.D.     CYB/MEDQ  D:  07/20/2016  T:  07/21/2016  Job:  [id number]

## 2016-07-21 NOTE — Progress Notes (Signed)
Subjective: 1 Day Post-Op Procedure(s) (LRB): RIGHT TOTAL HIP ARTHROPLASTY ANTERIOR APPROACH (Right) Patient reports pain as moderate.    Objective: Vital signs in last 24 hours: Temp:  [97.3 F (36.3 C)-98.9 F (37.2 C)] 98.5 F (36.9 C) (07/22 0456) Pulse Rate:  [52-67] 65 (07/22 0456) Resp:  [13-16] 15 (07/22 0456) BP: (102-161)/(70-89) 151/71 mmHg (07/22 0456) SpO2:  [93 %-100 %] 96 % (07/22 0456) Weight:  [69.4 kg (153 lb)] 69.4 kg (153 lb) (07/21 1302)  Intake/Output from previous day: 07/21 0701 - 07/22 0700 In: 2000 [I.V.:2000] Out: 2310 [Urine:2210; Blood:100] Intake/Output this shift: Total I/O In: -  Out: 200 [Urine:200]   Recent Labs  07/21/16 0458  HGB 11.3*    Recent Labs  07/21/16 0458  WBC 13.1*  RBC 3.79*  HCT 34.9*  PLT 252    Recent Labs  07/21/16 0458  NA 137  K 4.4  CL 103  CO2 28  BUN 14  CREATININE 0.98  GLUCOSE 258*  CALCIUM 9.5    Recent Labs  07/20/16 1330 07/21/16 0458  INR 0.92 1.03    Sensation intact distally Intact pulses distally Dorsiflexion/Plantar flexion intact Incision: dressing C/D/I  Assessment/Plan: 1 Day Post-Op Procedure(s) (LRB): RIGHT TOTAL HIP ARTHROPLASTY ANTERIOR APPROACH (Right) Up with therapy Discharge home with home health today.  Antonius Hartlage Y 07/21/2016, 12:12 PM

## 2016-07-21 NOTE — Care Management Note (Signed)
Case Management Note  Patient Details  Name: [name] MRN: [id number] Date of Birth: [birth year]  Subjective/Objective:                  RIGHT TOTAL HIP ARTHROPLASTY ANTERIOR APPROACH (Right) Action/Plan:  Expected Discharge Date:  07/21/16               Expected Discharge Plan:  Home w Home Health Services  In-House Referral:     Discharge planning Services  CM Consult  Post Acute Care Choice:    Choice offered to:  Patient  DME Arranged:  N/A DME Agency:  NA  HH Arranged:  PT HH Agency:  [facility] (now [facility])  Status of Service:  Completed, signed off  If discussed at [employer] of Stay Meetings, dates discussed:    Additional Comments: CM spoke with pt to confirm choice of home health agency.  Pt chooses [facility] to render HHPT. Pt states he still has all DME from previous surgery and denies need for additional DMe.  No other CM needs were communicated. Yves Dill, RN 07/21/2016, 12:27 PM

## 2016-07-21 NOTE — Progress Notes (Signed)
Assessment unchanged. Pt and wife verbalized understanding of dc instructions through teach back including follow up care and when to call the MD. scripts x 3 given as provided by MD. Discharged via wc to front entrance accompanied by wife and NT.

## 2016-07-21 NOTE — Evaluation (Signed)
Physical Therapy Evaluation Patient Details Name: Steve Vang MRN: [id number] DOB: 1966-10-13 Today's Date: 07/21/2016   History of Present Illness  Pt s/p R THR and with hx of L THR (6/17)CAD, CVA, COPD, CABG and cardiomyopathy  Clinical Impression  Pt s/p R THR presents with decreased R LE strength/ROM and post op pain limiting functional mobility.  Pt should progress to dc home with HHPT and assist of family.    Follow Up Recommendations Home health PT    Equipment Recommendations  None recommended by PT    Recommendations for Other Services       Precautions / Restrictions Precautions Precautions: Fall Restrictions Weight Bearing Restrictions: No RLE Weight Bearing: Weight bearing as tolerated      Mobility  Bed Mobility Overal bed mobility: Modified Independent             General bed mobility comments: Pt to/from bed unassisted  Transfers Overall transfer level: Needs assistance   Transfers: Sit to/from Stand Sit to Stand: Supervision         General transfer comment: cues for saftey  Ambulation/Gait Ambulation/Gait assistance: Min guard;Supervision Ambulation Distance (Feet): 220 Feet Assistive device: Crutches Gait Pattern/deviations: Step-to pattern;Step-through pattern;Shuffle     General Gait Details: Pt demonstrating good skills and stability with crutches  Stairs            Wheelchair Mobility    Modified Rankin (Stroke Patients Only)       Balance                                             Pertinent Vitals/Pain Pain Assessment: 0-10 Pain Score: 4  Pain Location: R hip Pain Descriptors / Indicators: Aching;Sore Pain Intervention(s): Limited activity within patient's tolerance;Monitored during session;Premedicated before session;Ice applied    Home Living Family/patient expects to be discharged to:: Private residence Living Arrangements: Spouse/significant other Available Help at Discharge:  Family Type of Home: House Home Access: Stairs to enter Entrance Stairs-Rails: Right;Left;Can reach both Secretary/administrator of Steps: 3 Home Layout: One level Home Equipment: Walker - 2 wheels;Crutches      Prior Function Level of Independence: Independent;Independent with assistive device(s)         Comments: Utilized crutches as needed     Hand Dominance        Extremity/Trunk Assessment   Upper Extremity Assessment: Overall WFL for tasks assessed           Lower Extremity Assessment: LLE deficits/detail;RLE deficits/detail RLE Deficits / Details: Strength at hip 2+/5 with AAROm to 75 flex and 15 abd LLE Deficits / Details: Strength WFL with hip flex ltd to ~95 AROM  Cervical / Trunk Assessment: Normal  Communication   Communication: No difficulties  Cognition Arousal/Alertness: Awake/alert Behavior During Therapy: WFL for tasks assessed/performed Overall Cognitive Status: Within Functional Limits for tasks assessed                      General Comments      Exercises Total Joint Exercises Ankle Circles/Pumps: AROM;Both;15 reps;Supine Quad Sets: AROM;Both;10 reps;Supine Heel Slides: AAROM;Right;20 reps;Supine Hip ABduction/ADduction: AAROM;Right;15 reps;Supine      Assessment/Plan    PT Assessment Patient needs continued PT services  PT Diagnosis Difficulty walking   PT Problem List Decreased strength;Decreased range of motion;Decreased activity tolerance;Decreased knowledge of use of DME;Decreased safety awareness;Pain  PT Treatment Interventions  DME instruction;Gait training;Stair training;Functional mobility training;Therapeutic activities;Therapeutic exercise;Patient/family education   PT Goals (Current goals can be found in the Care Plan section) Acute Rehab PT Goals 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 PT Goal Formulation: With patient Time For Goal Achievement: 07/22/16 Potential to Achieve Goals: Good    Frequency  7X/week   Barriers to discharge        Co-evaluation               End of Session Equipment Utilized During Treatment: Gait belt Activity Tolerance: Patient tolerated treatment well Patient left: in chair;with call bell/phone within reach;with family/visitor present Nurse Communication: Mobility status         Time: 8466-5993 PT Time Calculation (min) (ACUTE ONLY): 27 min   Charges:   PT Evaluation $PT Eval Low Complexity: 1 Procedure PT Treatments $Gait Training: 8-22 mins   PT G Codes:        Deepika Decatur 07-22-2016, 1:10 PM

## 2016-07-21 NOTE — Progress Notes (Signed)
Pt upset upon initial rounds. Requested day NT not come back to room. Pt stated "she was rude". NT had been in to assist pt to bathroom. Able to deescalate pt through verbal and tactile reassurance. Pt stated " I wanted to use my crutches instead of walker." Instructed and reinforced that pt safety is of high importance. Encouraged pt and significant other to call for assistance before needing to ambulate to BR. Physical therapy to see pt soon.

## 2016-07-22 NOTE — Discharge Summary (Signed)
Patient ID: Steve Vang MRN: [id number] DOB/AGE: 06/10/1966 50 y.o.  Admit date: 07/20/2016 Discharge date: 07/22/2016  Admission Diagnoses:  Principal Problem:   Avascular necrosis of bone of right hip [facility]) Active Problems:   Status post total replacement of right hip   Discharge Diagnoses:  Same  Past Medical History:  Diagnosis Date  . CAD (coronary artery disease)    Multivessel, LVEF 45-50%  . Cardiomyopathy (HCC)    LVEF 50-55% December 2016  . CKD (chronic kidney disease) stage 2, GFR 60-89 ml/min   . COPD (chronic obstructive pulmonary disease) (HCC)   . Depression   . Essential hypertension   . Falls   . Gallstone   . Gastroenteritis   . History of stroke 2004  . Hyperlipidemia   . Left ventricular mural thrombus (HCC)    On Coumadin    Surgeries: Procedure(s): RIGHT TOTAL HIP ARTHROPLASTY ANTERIOR APPROACH on 07/20/2016   Consultants:   Discharged Condition: Improved  Hospital Course: Steve Vang is an 50 y.o. male who was admitted 07/20/2016 for operative treatment ofAvascular necrosis of bone of right hip (HCC). Patient has severe unremitting pain that affects sleep, daily activities, and work/hobbies. After pre-op clearance the patient was taken to the operating room on 07/20/2016 and underwent  Procedure(s): RIGHT TOTAL HIP ARTHROPLASTY ANTERIOR APPROACH.    Patient was given perioperative antibiotics: Anti-infectives    Start     Dose/Rate Route Frequency Ordered Stop   07/21/16 0600  ceFAZolin (ANCEF) IVPB 2g/100 mL premix     2 g 200 mL/hr over 30 Minutes Intravenous On call to O.R. 07/20/16 1259 07/20/16 1712   07/20/16 2300  ceFAZolin (ANCEF) IVPB 1 g/50 mL premix     1 g 100 mL/hr over 30 Minutes Intravenous Every 6 hours 07/20/16 1958 07/21/16 0541       Patient was given sequential compression devices, early ambulation, and chemoprophylaxis to prevent DVT.  Patient benefited maximally from hospital stay and there were no complications.     Recent vital signs: No data found.    Recent laboratory studies:  Recent Labs  07/20/16 1330 07/21/16 0458  WBC  --  13.1*  HGB  --  11.3*  HCT  --  34.9*  PLT  --  252  NA  --  137  K  --  4.4  CL  --  103  CO2  --  28  BUN  --  14  CREATININE  --  0.98  GLUCOSE  --  258*  INR 0.92 1.03  CALCIUM  --  9.5     Discharge Medications:     Medication List    STOP taking these medications   oxyCODONE-acetaminophen 5-325 MG tablet Commonly known as:  PERCOCET/ROXICET     TAKE these medications   aspirin EC 81 MG tablet Take 81 mg by mouth daily.   citalopram 20 MG tablet Commonly known as:  CELEXA Take 20 mg by mouth every evening. Reported on 05/15/2016   cyclobenzaprine 10 MG tablet Commonly known as:  FLEXERIL Take 1 tablet (10 mg total) by mouth 3 (three) times daily as needed for muscle spasms.   HYDROmorphone 2 MG tablet Commonly known as:  DILAUDID Take 1 tablet (2 mg total) by mouth every 4 (four) hours as needed for severe pain. What changed:  Another medication with the same name was added. Make sure you understand how and when to take each.   HYDROmorphone 2 MG tablet Commonly known as:  DILAUDID Take 1 tablet (2 mg total) by mouth every 4 (four) hours as needed for severe pain. What changed:  You were already taking a medication with the same name, and this prescription was added. Make sure you understand how and when to take each.   lisinopril 20 MG tablet Commonly known as:  PRINIVIL,ZESTRIL TAKE ONE TABLET BY MOUTH ONCE DAILY   LOVENOX 100 MG/ML injection Generic drug:  enoxaparin Inject 1 mL (100 mg total) into the skin daily.   methocarbamol 500 MG tablet Commonly known as:  ROBAXIN Take 1 tablet (500 mg total) by mouth every 6 (six) hours as needed for muscle spasms.   metoprolol tartrate 25 MG tablet Commonly known as:  LOPRESSOR Take 1 tablet (25 mg total) by mouth 2 (two) times daily.   nitroGLYCERIN 0.4 MG SL tablet Commonly  known as:  NITROSTAT Place 1 tablet (0.4 mg total) under the tongue every 5 (five) minutes as needed. What changed:  reasons to take this   simvastatin 20 MG tablet Commonly known as:  ZOCOR Take 20 mg by mouth daily.   warfarin 5 MG tablet Commonly known as:  COUMADIN Take 1 1/2 tablets daily except 1 tablet on Sundays and Thursdays What changed:  how much to take  how to take this  when to take this  additional instructions       Diagnostic Studies: Dg C-arm 1-60 Min-no Report  Result Date: 07/20/2016 CLINICAL DATA: hip C-ARM 1-60 MINUTES Fluoroscopy was utilized by the requesting physician.  No radiographic interpretation.   Dg Hip Port Unilat With Pelvis 1v Right  Result Date: 07/20/2016 CLINICAL DATA:  Patient status post right hip arthroplasty. EXAM: DG HIP (WITH OR WITHOUT PELVIS) 1V PORT RIGHT COMPARISON:  Earlier same day. FINDINGS: Postprocedural changes compatible with right hip arthroplasty. Hardware appears intact and in appropriate position. Gas within the overlying soft tissues compatible with postoperative state. IMPRESSION: Patient status post right hip arthroplasty. Electronically Signed   By: Annia Belt M.D.   On: 07/20/2016 20:00   Dg Hip Operative Unilat With Pelvis Right  Result Date: 07/20/2016 CLINICAL DATA:  Patient status post right hip arthroplasty. EXAM: DG C-ARM 1-60 MIN-NO REPORT; OPERATIVE RIGHT HIP WITH PELVIS COMPARISON:  Hip radiograph 09/29/2015 FINDINGS: Five intraoperative fluoroscopic images were submitted for interpretation. Findings compatible with right hip arthroplasty. Hardware appears in appropriate position. No acute osseous abnormality. IMPRESSION: Patient status post right hip arthroplasty. Electronically Signed   By: Annia Belt M.D.   On: 07/20/2016 19:19    Disposition: 01-Home or Self Care  Discharge Instructions    Call MD / Call 911    Complete by:  As directed   If you experience chest pain or shortness of breath, CALL  911 and be transported to the hospital emergency room.  If you develope a fever above 101 F, pus (white drainage) or increased drainage or redness at the wound, or calf pain, call your surgeon's office.   Call MD / Call 911    Complete by:  As directed   If you experience chest pain or shortness of breath, CALL 911 and be transported to the hospital emergency room.  If you develope a fever above 101 F, pus (white drainage) or increased drainage or redness at the wound, or calf pain, call your surgeon's office.   Constipation Prevention    Complete by:  As directed   Drink plenty of fluids.  Prune juice may be helpful.  You may use a  stool softener, such as Colace (over the counter) 100 mg twice a day.  Use MiraLax (over the counter) for constipation as needed.   Constipation Prevention    Complete by:  As directed   Drink plenty of fluids.  Prune juice may be helpful.  You may use a stool softener, such as Colace (over the counter) 100 mg twice a day.  Use MiraLax (over the counter) for constipation as needed.   Diet - low sodium heart healthy    Complete by:  As directed   Diet - low sodium heart healthy    Complete by:  As directed   Discharge patient    Complete by:  As directed   Increase activity slowly as tolerated    Complete by:  As directed   Increase activity slowly as tolerated    Complete by:  As directed      Follow-up Information    Kathryne Hitch, MD In 2 weeks.   Specialty:  Orthopedic Surgery Contact information: [address] [place] [address] [phone]            Signed: Kathryne Hitch 07/22/2016, 11:00 AM

## 2016-07-23 ENCOUNTER — Encounter (HOSPITAL_COMMUNITY): Payer: Self-pay | Admitting: Orthopaedic Surgery

## 2016-07-25 ENCOUNTER — Ambulatory Visit (INDEPENDENT_AMBULATORY_CARE_PROVIDER_SITE_OTHER): Payer: Medicaid Other | Admitting: *Deleted

## 2016-07-25 DIAGNOSIS — I213 ST elevation (STEMI) myocardial infarction of unspecified site: Secondary | ICD-10-CM | POA: Diagnosis not present

## 2016-07-25 DIAGNOSIS — Z5181 Encounter for therapeutic drug level monitoring: Secondary | ICD-10-CM

## 2016-07-25 DIAGNOSIS — I513 Intracardiac thrombosis, not elsewhere classified: Secondary | ICD-10-CM

## 2016-07-25 LAB — POCT INR: INR: 1.8

## 2016-08-01 ENCOUNTER — Ambulatory Visit (INDEPENDENT_AMBULATORY_CARE_PROVIDER_SITE_OTHER): Payer: Medicaid Other | Admitting: *Deleted

## 2016-08-01 DIAGNOSIS — Z5181 Encounter for therapeutic drug level monitoring: Secondary | ICD-10-CM

## 2016-08-01 DIAGNOSIS — I213 ST elevation (STEMI) myocardial infarction of unspecified site: Secondary | ICD-10-CM | POA: Diagnosis not present

## 2016-08-01 DIAGNOSIS — I513 Intracardiac thrombosis, not elsewhere classified: Secondary | ICD-10-CM

## 2016-08-01 LAB — POCT INR: INR: 2.8

## 2016-08-20 ENCOUNTER — Ambulatory Visit (INDEPENDENT_AMBULATORY_CARE_PROVIDER_SITE_OTHER): Payer: Medicaid Other | Admitting: *Deleted

## 2016-08-20 DIAGNOSIS — Z5181 Encounter for therapeutic drug level monitoring: Secondary | ICD-10-CM | POA: Diagnosis not present

## 2016-08-20 DIAGNOSIS — I213 ST elevation (STEMI) myocardial infarction of unspecified site: Secondary | ICD-10-CM

## 2016-08-20 DIAGNOSIS — I513 Intracardiac thrombosis, not elsewhere classified: Secondary | ICD-10-CM

## 2016-08-20 LAB — POCT INR: INR: 3.4

## 2016-09-05 NOTE — Progress Notes (Signed)
Cardiology Office Note  Date: 09/06/2016   ID: Steve Vang, DOB 1966-09-20, MRN [id number]  PCP: Isabella Stalling, MD  Primary Cardiologist: [name], MD   Chief Complaint  Patient presents with  . Coronary Artery Disease    History of Present Illness: Steve Vang is a 50 y.o. male last seen in June. He presents with his wife for a routine follow-up visit. From a cardiac perspective he has been stable without angina symptoms.  Record review finds interval bilateral total hip replacements with Dr. Magnus Ivan for management of avascular necrosis. He has recuperated well, states that he is in much less pain and pleased with his progress.  He needed refills on all of his cardiac medications which were reviewed today.  He continues on Coumadin with follow-up in the anticoagulation clinic. He has had no spontaneous bleeding problems.  Past Medical History:  Diagnosis Date  . CAD (coronary artery disease)    Multivessel, LVEF 45-50%  . Cardiomyopathy (HCC)    LVEF 50-55% December 2016  . CKD (chronic kidney disease) stage 2, GFR 60-89 ml/min   . COPD (chronic obstructive pulmonary disease) (HCC)   . Depression   . Essential hypertension   . Falls   . Gallstone   . Gastroenteritis   . History of stroke 2004  . Hyperlipidemia   . Left ventricular mural thrombus (HCC)    On Coumadin    Past Surgical History:  Procedure Laterality Date  . CORONARY ARTERY BYPASS GRAFT     DOR anterior ventricular restoration surgery 8/06, [facility] [place] to first diagonal, SVG to PLB, SVG to RVE branch of nondominant RCA  . EYE SURGERY    . TOTAL HIP ARTHROPLASTY Left 06/08/2016   Procedure: TOTAL HIP ARTHROPLASTY ANTERIOR APPROACH;  Surgeon: Kathryne Hitch, MD;  Location: [facility];  Service: Orthopedics;  Laterality: Left;  . TOTAL HIP ARTHROPLASTY Right 07/20/2016   Procedure: RIGHT TOTAL HIP ARTHROPLASTY ANTERIOR APPROACH;  Surgeon: Kathryne Hitch, MD;   Location: [facility];  Service: Orthopedics;  Laterality: Right;    Current Outpatient Prescriptions  Medication Sig Dispense Refill  . aspirin EC 81 MG tablet Take 81 mg by mouth daily.    . metoprolol tartrate (LOPRESSOR) 25 MG tablet Take 1 tablet (25 mg total) by mouth 2 (two) times daily. 60 tablet 3  . nitroGLYCERIN (NITROSTAT) 0.4 MG SL tablet Place 1 tablet (0.4 mg total) under the tongue every 5 (five) minutes as needed. (Patient taking differently: Place 0.4 mg under the tongue every 5 (five) minutes as needed for chest pain. ) 25 tablet 3  . simvastatin (ZOCOR) 20 MG tablet Take 20 mg by mouth daily.    Marland Kitchen warfarin (COUMADIN) 5 MG tablet Take 1 1/2 tablets daily except 1 tablet on Sundays and Thursdays (Patient taking differently: Take 5-7.5 mg by mouth daily after supper. Take one and a half tablets (7.5mg ) daily except on Sunday and Thursday. Take one tablet (5mg ) on those two days.) 45 tablet 3  . lisinopril (PRINIVIL,ZESTRIL) 10 MG tablet Take 1 tablet (10 mg total) by mouth daily. 90 tablet 3   No current facility-administered medications for this visit.    Allergies:  Benadryl [diphenhydramine] and Chantix [varenicline]   Social History: The patient  reports that he has been smoking Cigarettes.  He has a 7.50 pack-year smoking history. He has never used smokeless tobacco. He reports that he uses drugs, including Marijuana. He reports that he does not drink alcohol.  ROS:  Please see the history of present illness. Otherwise, complete review of systems is positive for NYHA class II dyspnea.  All other systems are reviewed and negative.   Physical Exam: VS:  BP (!) 160/90   Pulse 70   Ht 5\' 7"  (1.702 m)   Wt 158 lb (71.7 kg)   SpO2 98%   BMI 24.75 kg/m , BMI Body mass index is 24.75 kg/m.  Wt Readings from Last 3 Encounters:  09/06/16 158 lb (71.7 kg)  07/20/16 153 lb (69.4 kg)  07/12/16 153 lb (69.4 kg)    General: Appears comfortable at rest. HEENT: Conjunctiva and  lids normal, oropharynx clear with poor dentition. Neck: Supple, no elevated JVP or carotid bruits, no thyromegaly. Lungs: Clear to auscultation, nonlabored breathing at rest. Cardiac: Regular rate and rhythm, no S3 or significant systolic murmur, no pericardial rub. Abdomen: Soft, nontender, bowel sounds present, no guarding or rebound. Extremities: No pitting edema, distal pulses 2+. Skin: Warm and dry. Musculoskeletal: No kyphosis. Neuropsychiatric: Alert and oriented x3, affect grossly appropriate.  ECG: I personally reviewed the tracing from 05/15/2016 which shows sinus rhythm with old high lateral and probable old anteroseptal infarct pattern and diffuse nonspecific ST segment abnormalities.  Recent Labwork: 05/24/2016: ALT 23; AST 25 07/21/2016: BUN 14; Creatinine, Ser 0.98; Hemoglobin 11.3; Platelets 252; Potassium 4.4; Sodium 137   Other Studies Reviewed Today:  Echocardiogram 12/28/2015: Study Conclusions  - Left ventricle: The cavity size was normal. There was mild  concentric hypertrophy. Systolic function was normal. The  estimated ejection fraction was in the range of 50% to 55%.  Features are consistent with a pseudonormal left ventricular  filling pattern, with concomitant abnormal relaxation and  increased filling pressure (grade 2 diastolic dysfunction).  Doppler parameters are consistent with high ventricular filling  pressure. - Regional wall motion abnormality: Akinesis of the mid  anteroseptal myocardium; mild hypokinesis of the apical septal  myocardium; probable mild hypokinesis of the apical myocardium. - Aortic valve: Mildly to moderately calcified annulus. - Mitral valve: Calcified annulus. Mildly thickened leaflets . - Right ventricle: Systolic function was mildly reduced.  Impressions:  - While the apical endocardium was suboptimally visualized, there  was no obvious thrombus seen.  Assessment and Plan:  1. Multivessel CAD status post  CABG. He is symptomatically stable without progressive angina symptoms on medical therapy. Refills provided today. We will continue observation.  2. History of ischemic cardiomyopathy with LV mural thrombus, most recent echocardiogram in December 2016 showed LVEF 50-55% with wall motion abnormalities but no definite thrombus. We have continued Coumadin particular with prior history of stroke.  3. Essential hypertension, blood pressure elevated today. He has not been on lisinopril. We are refilling his medications and will resume lisinopril at 10 mg daily, can up titrate if needed.  4. Hyperlipidemia, on Zocor. He follows lab work with Dr. Janna Archondiego.  5. Ongoing tobacco abuse. He has not been motivated to quit.  Current medicines were reviewed with the patient today.  Disposition: Follow-up with me in 6 months.  Signed, Jonelle SidleSamuel G. Alzena Gerber, MD, [facility]ACC 09/06/2016 9:39 AM    [facility] at [facility] [address], [place], [place] [address] Phone: [phone]; Fax: [phone]

## 2016-09-06 ENCOUNTER — Ambulatory Visit (INDEPENDENT_AMBULATORY_CARE_PROVIDER_SITE_OTHER): Payer: Medicaid Other | Admitting: Cardiology

## 2016-09-06 ENCOUNTER — Encounter: Payer: Self-pay | Admitting: Cardiology

## 2016-09-06 VITALS — BP 160/90 | HR 70 | Ht 67.0 in | Wt 158.0 lb

## 2016-09-06 DIAGNOSIS — E782 Mixed hyperlipidemia: Secondary | ICD-10-CM

## 2016-09-06 DIAGNOSIS — I2510 Atherosclerotic heart disease of native coronary artery without angina pectoris: Secondary | ICD-10-CM

## 2016-09-06 DIAGNOSIS — I255 Ischemic cardiomyopathy: Secondary | ICD-10-CM

## 2016-09-06 DIAGNOSIS — I10 Essential (primary) hypertension: Secondary | ICD-10-CM | POA: Diagnosis not present

## 2016-09-06 DIAGNOSIS — Z720 Tobacco use: Secondary | ICD-10-CM

## 2016-09-06 MED ORDER — LISINOPRIL 10 MG PO TABS: 10.0000 mg | ORAL_TABLET | Freq: Every day | ORAL | 3 refills | Status: DC

## 2016-09-06 MED ORDER — METOPROLOL TARTRATE 25 MG PO TABS: 25.0000 mg | ORAL_TABLET | Freq: Two times a day (BID) | ORAL | 3 refills | Status: DC

## 2016-09-06 MED ORDER — SIMVASTATIN 20 MG PO TABS: 20.0000 mg | ORAL_TABLET | Freq: Every day | ORAL | 6 refills | Status: DC

## 2016-09-06 NOTE — Patient Instructions (Addendum)
Your physician wants you to follow-up in:  6 months Dr Randa Spike will receive a reminder letter in the mail two months in advance. If you don't receive a letter, please call our office to schedule the follow-up appointment.    DECREASE Lisinopril to 10 mg daily       Thank you for choosing [facility] !

## 2016-09-17 ENCOUNTER — Ambulatory Visit (INDEPENDENT_AMBULATORY_CARE_PROVIDER_SITE_OTHER): Payer: Medicaid Other | Admitting: *Deleted

## 2016-09-17 DIAGNOSIS — I513 Intracardiac thrombosis, not elsewhere classified: Secondary | ICD-10-CM

## 2016-09-17 DIAGNOSIS — Z5181 Encounter for therapeutic drug level monitoring: Secondary | ICD-10-CM

## 2016-09-17 DIAGNOSIS — I213 ST elevation (STEMI) myocardial infarction of unspecified site: Secondary | ICD-10-CM

## 2016-09-17 LAB — POCT INR: INR: 1.6

## 2016-10-02 ENCOUNTER — Ambulatory Visit (INDEPENDENT_AMBULATORY_CARE_PROVIDER_SITE_OTHER): Payer: Medicaid Other | Admitting: Orthopaedic Surgery

## 2016-10-02 DIAGNOSIS — M1611 Unilateral primary osteoarthritis, right hip: Secondary | ICD-10-CM

## 2016-10-02 DIAGNOSIS — M87052 Idiopathic aseptic necrosis of left femur: Secondary | ICD-10-CM

## 2016-10-02 DIAGNOSIS — M87051 Idiopathic aseptic necrosis of right femur: Secondary | ICD-10-CM

## 2016-10-03 ENCOUNTER — Ambulatory Visit (INDEPENDENT_AMBULATORY_CARE_PROVIDER_SITE_OTHER): Payer: Medicaid Other | Admitting: *Deleted

## 2016-10-03 DIAGNOSIS — Z5181 Encounter for therapeutic drug level monitoring: Secondary | ICD-10-CM

## 2016-10-03 DIAGNOSIS — I513 Intracardiac thrombosis, not elsewhere classified: Secondary | ICD-10-CM | POA: Diagnosis not present

## 2016-10-03 LAB — POCT INR: INR: 2.2

## 2016-10-24 ENCOUNTER — Ambulatory Visit (INDEPENDENT_AMBULATORY_CARE_PROVIDER_SITE_OTHER): Payer: Medicaid Other | Admitting: *Deleted

## 2016-10-24 DIAGNOSIS — Z5181 Encounter for therapeutic drug level monitoring: Secondary | ICD-10-CM | POA: Diagnosis not present

## 2016-10-24 DIAGNOSIS — I513 Intracardiac thrombosis, not elsewhere classified: Secondary | ICD-10-CM | POA: Diagnosis not present

## 2016-10-24 LAB — POCT INR: INR: 2

## 2016-11-12 ENCOUNTER — Other Ambulatory Visit: Payer: Self-pay | Admitting: Cardiology

## 2016-11-21 ENCOUNTER — Ambulatory Visit (INDEPENDENT_AMBULATORY_CARE_PROVIDER_SITE_OTHER): Payer: Medicaid Other | Admitting: *Deleted

## 2016-11-21 DIAGNOSIS — Z5181 Encounter for therapeutic drug level monitoring: Secondary | ICD-10-CM | POA: Diagnosis not present

## 2016-11-21 DIAGNOSIS — I513 Intracardiac thrombosis, not elsewhere classified: Secondary | ICD-10-CM

## 2016-11-21 LAB — POCT INR: INR: 2.2

## 2016-12-19 ENCOUNTER — Ambulatory Visit (INDEPENDENT_AMBULATORY_CARE_PROVIDER_SITE_OTHER): Payer: Medicaid Other | Admitting: *Deleted

## 2016-12-19 DIAGNOSIS — I513 Intracardiac thrombosis, not elsewhere classified: Secondary | ICD-10-CM

## 2016-12-19 DIAGNOSIS — Z5181 Encounter for therapeutic drug level monitoring: Secondary | ICD-10-CM

## 2016-12-19 LAB — POCT INR: INR: 3

## 2017-01-02 ENCOUNTER — Ambulatory Visit (INDEPENDENT_AMBULATORY_CARE_PROVIDER_SITE_OTHER): Payer: Medicaid Other | Admitting: Orthopaedic Surgery

## 2017-01-02 ENCOUNTER — Encounter (INDEPENDENT_AMBULATORY_CARE_PROVIDER_SITE_OTHER): Payer: Self-pay

## 2017-01-02 DIAGNOSIS — Z96642 Presence of left artificial hip joint: Secondary | ICD-10-CM | POA: Diagnosis not present

## 2017-01-02 DIAGNOSIS — Z96641 Presence of right artificial hip joint: Secondary | ICD-10-CM | POA: Diagnosis not present

## 2017-01-02 MED ORDER — TIZANIDINE HCL 4 MG PO TABS: 4.0000 mg | ORAL_TABLET | Freq: Three times a day (TID) | ORAL | 0 refills | Status: DC | PRN

## 2017-01-02 NOTE — Progress Notes (Signed)
The patient is now 5 months status post a right total hip arthroplasty and 7 months status post a left total hip arthroplasty. He is doing well until the: Weather. He says he has some pain in his right hip and some in his left hip as well.  He is walking without a significant limp at all. He gets up on the exam table easily. I can easily put both hips through internal/external rotation without difficulty at all. He appears in no acute distress. He denies any chest pain, shortness of breath, fever, chills, nausea, vomiting. Both incisions are healed well. His leg lengths are equal.  At this point totaled expect his pain subside as the implant slowly growing to his bone. I'll have him try Zanaflex as a muscle relaxant. I do not need to see him back for 6 months. At that visit I would like just a low AP pelvis.

## 2017-01-30 ENCOUNTER — Ambulatory Visit (INDEPENDENT_AMBULATORY_CARE_PROVIDER_SITE_OTHER): Payer: Medicaid Other | Admitting: *Deleted

## 2017-01-30 DIAGNOSIS — I513 Intracardiac thrombosis, not elsewhere classified: Secondary | ICD-10-CM

## 2017-01-30 DIAGNOSIS — Z5181 Encounter for therapeutic drug level monitoring: Secondary | ICD-10-CM

## 2017-01-30 LAB — POCT INR: INR: 2.9

## 2017-01-31 ENCOUNTER — Encounter (HOSPITAL_COMMUNITY): Payer: Self-pay | Admitting: *Deleted

## 2017-01-31 ENCOUNTER — Emergency Department (HOSPITAL_COMMUNITY)
Admission: EM | Admit: 2017-01-31 | Discharge: 2017-01-31 | Disposition: A | Discharge status: Home / Self Care | Payer: Medicaid Other | Attending: Emergency Medicine | Admitting: Emergency Medicine

## 2017-01-31 ENCOUNTER — Emergency Department (HOSPITAL_COMMUNITY): Payer: Medicaid Other

## 2017-01-31 DIAGNOSIS — J449 Chronic obstructive pulmonary disease, unspecified: Secondary | ICD-10-CM | POA: Diagnosis not present

## 2017-01-31 DIAGNOSIS — N182 Chronic kidney disease, stage 2 (mild): Secondary | ICD-10-CM | POA: Diagnosis not present

## 2017-01-31 DIAGNOSIS — I2510 Atherosclerotic heart disease of native coronary artery without angina pectoris: Secondary | ICD-10-CM | POA: Insufficient documentation

## 2017-01-31 DIAGNOSIS — Z7982 Long term (current) use of aspirin: Secondary | ICD-10-CM | POA: Diagnosis not present

## 2017-01-31 DIAGNOSIS — Z79899 Other long term (current) drug therapy: Secondary | ICD-10-CM | POA: Diagnosis not present

## 2017-01-31 DIAGNOSIS — I129 Hypertensive chronic kidney disease with stage 1 through stage 4 chronic kidney disease, or unspecified chronic kidney disease: Secondary | ICD-10-CM | POA: Insufficient documentation

## 2017-01-31 DIAGNOSIS — Z7901 Long term (current) use of anticoagulants: Secondary | ICD-10-CM | POA: Diagnosis not present

## 2017-01-31 DIAGNOSIS — R197 Diarrhea, unspecified: Secondary | ICD-10-CM | POA: Diagnosis not present

## 2017-01-31 DIAGNOSIS — Z951 Presence of aortocoronary bypass graft: Secondary | ICD-10-CM | POA: Insufficient documentation

## 2017-01-31 DIAGNOSIS — N134 Hydroureter: Secondary | ICD-10-CM | POA: Diagnosis not present

## 2017-01-31 DIAGNOSIS — F17210 Nicotine dependence, cigarettes, uncomplicated: Secondary | ICD-10-CM | POA: Insufficient documentation

## 2017-01-31 DIAGNOSIS — R319 Hematuria, unspecified: Secondary | ICD-10-CM

## 2017-01-31 LAB — BASIC METABOLIC PANEL
ANION GAP: 11 (ref 5–15)
BUN: 14 mg/dL (ref 6–20)
CALCIUM: 9.4 mg/dL (ref 8.9–10.3)
CO2: 25 mmol/L (ref 22–32)
CREATININE: 1.44 mg/dL — AB (ref 0.61–1.24)
Chloride: 101 mmol/L (ref 101–111)
GFR calc Af Amer: >60 mL/min (ref >60)
GFR calc non Af Amer: 55 mL/min — ABNORMAL LOW (ref >60)
GLUCOSE: 133 mg/dL — AB (ref 65–99)
Potassium: 3.6 mmol/L (ref 3.5–5.1)
Sodium: 137 mmol/L (ref 135–145)

## 2017-01-31 LAB — URINALYSIS, ROUTINE W REFLEX MICROSCOPIC
BILIRUBIN URINE: NEGATIVE
GLUCOSE, UA: NEGATIVE mg/dL
KETONES UR: NEGATIVE mg/dL
LEUKOCYTES UA: NEGATIVE
NITRITE: NEGATIVE
PH: 5 (ref 5.0–8.0)
Protein, ur: 100 mg/dL — AB
Specific Gravity, Urine: 1.017 (ref 1.005–1.030)

## 2017-01-31 LAB — CBC WITH DIFFERENTIAL/PLATELET
BASOS PCT: 1 %
Basophils Absolute: 0 10*3/uL (ref 0.0–0.1)
EOS PCT: 2 %
Eosinophils Absolute: 0.2 10*3/uL (ref 0.0–0.7)
HEMATOCRIT: 47.8 % (ref 39.0–52.0)
Hemoglobin: 16 g/dL (ref 13.0–17.0)
Lymphocytes Relative: 35 %
Lymphs Abs: 3 10*3/uL (ref 0.7–4.0)
MCH: 30 pg (ref 26.0–34.0)
MCHC: 33.5 g/dL (ref 30.0–36.0)
MCV: 89.5 fL (ref 78.0–100.0)
MONO ABS: 1 10*3/uL (ref 0.1–1.0)
MONOS PCT: 12 %
Neutro Abs: 4.3 10*3/uL (ref 1.7–7.7)
Neutrophils Relative %: 50 %
Platelets: 265 10*3/uL (ref 150–400)
RBC: 5.34 MIL/uL (ref 4.22–5.81)
RDW: 13.9 % (ref 11.5–15.5)
WBC: 8.6 10*3/uL (ref 4.0–10.5)

## 2017-01-31 MED ORDER — HYDROCODONE-ACETAMINOPHEN 5-325 MG PO TABS: 2.0000 | ORAL_TABLET | ORAL | 0 refills | Status: DC | PRN

## 2017-01-31 MED ORDER — ONDANSETRON HCL 4 MG/2ML IJ SOLN
4.0000 mg | Freq: Once | INTRAMUSCULAR | Status: AC
  Administered 2017-01-31: 4 mg via INTRAVENOUS
  Filled 2017-01-31: qty 2

## 2017-01-31 MED ORDER — KETOROLAC TROMETHAMINE 30 MG/ML IJ SOLN
30.0000 mg | Freq: Once | INTRAMUSCULAR | Status: AC
  Administered 2017-01-31: 30 mg via INTRAVENOUS
  Filled 2017-01-31: qty 1

## 2017-01-31 MED ORDER — TAMSULOSIN HCL 0.4 MG PO CAPS
ORAL_CAPSULE | ORAL | 0 refills | Status: DC
Start: 2017-01-31 — End: 2019-05-27

## 2017-01-31 NOTE — ED Notes (Addendum)
EDP at bedside. Aware of HR

## 2017-01-31 NOTE — ED Notes (Signed)
Pt vomited approx 200 ml  EDP notified

## 2017-01-31 NOTE — ED Notes (Signed)
Pt transported to CT ?

## 2017-01-31 NOTE — ED Triage Notes (Signed)
Pt comes in for right sided flank pain starting this morning. Pt had blood in his urine yesterday. Pt is on a blood thinner. He is currently on cipro because pt had blood in his semen.

## 2017-01-31 NOTE — ED Notes (Signed)
Advised patient we needed a urine specimen 

## 2017-01-31 NOTE — ED Notes (Signed)
Pt voided approx 200 cc of burgundy urine in urinal

## 2017-01-31 NOTE — ED Provider Notes (Signed)
[facility]-EMER[facility]Y DEPT Provider Note   CSN: [id number] Arrival date & time: 01/31/17  1478  By signing my name below, I, Steve Vang, attest that this documentation has been prepared under the direction and in the presence of Mancel Bale, MD. Electronically Signed: Bobbie Vang, Scribe. 01/31/17. 8:46 AM. History   Chief Complaint Chief Complaint  Patient presents with  . Flank Pain     The history is provided by the patient. No language interpreter was used.   HPI Comments: Steve Vang is a 51 y.o. male who presents to the Emergency Department complaining of right plank pain that began early this morning. He also reports noticing blood in his urine since yesterday. He reports vomit x 2 and diarrhea x 3 today. He denies any blood in stool or vomit. He also denies dysuria. The patient was recently prescribed Cipro for an infection which he is currently taking. He states that he hasn't taken any of his medications today. He is currently on blood thinners. Past Medical History:  Diagnosis Date  . CAD (coronary artery disease)    Multivessel, LVEF 45-50%  . Cardiomyopathy (HCC)    LVEF 50-55% December 2016  . CKD (chronic kidney disease) stage 2, GFR 60-89 ml/min   . COPD (chronic obstructive pulmonary disease) (HCC)   . Depression   . Essential hypertension   . Falls   . Gallstone   . Gastroenteritis   . History of stroke 2004  . Hyperlipidemia   . Left ventricular mural thrombus    On Coumadin    Patient Active Problem List   Diagnosis Date Noted  . Avascular necrosis of bone of right hip (HCC) 07/20/2016  . 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 Avascular necrosis of bones of both hips (HCC) 06/08/2016  . Status post total replacement of left hip 06/08/2016  . AKI (acute kidney injury) (HCC) 05/15/2016  . Nausea and vomiting 05/15/2016  . Abdominal pain 05/15/2016  . Chest pain 05/15/2016  . Emesis   . Encounter for therapeutic drug monitoring  03/03/2014  . Long term (current) use of anticoagulants 04/16/2011  . GAIT IMBALANCE 02/02/2011  . ABNORMAL CV (STRESS) TEST 10/02/2010  . Mural thrombus of left ventricle (HCC) 09/08/2010  . OTHER SPEC FORMS CHRONIC ISCHEMIC HEART DISEASE 06/07/2010  . Mixed hyperlipidemia 03/02/2010  . TOBACCO ABUSE 03/02/2010  . Essential hypertension, benign 03/02/2010  . CORONARY ATHEROSCLEROSIS NATIVE CORONARY ARTERY 03/02/2010  . CVA 03/01/2010    Past Surgical History:  Procedure Laterality Date  . CORONARY ARTERY BYPASS GRAFT     DOR anterior ventricular restoration surgery 8/06, [facility] [place] to first diagonal, SVG to PLB, SVG to RVE branch of nondominant RCA  . EYE SURGERY    . TOTAL HIP ARTHROPLASTY Left 06/08/2016   Procedure: TOTAL HIP ARTHROPLASTY ANTERIOR APPROACH;  Surgeon: Kathryne Hitch, MD;  Location: [facility];  Service: Orthopedics;  Laterality: Left;  . TOTAL HIP ARTHROPLASTY Right 07/20/2016   Procedure: RIGHT TOTAL HIP ARTHROPLASTY ANTERIOR APPROACH;  Surgeon: Kathryne Hitch, MD;  Location: [facility];  Service: Orthopedics;  Laterality: Right;       Home Medications    Prior to Admission medications   Medication Sig Start Date End Date Taking? Authorizing Provider  aspirin EC 81 MG tablet Take 81 mg by mouth daily.   Yes Historical Provider, MD  ciprofloxacin (CIPRO) 500 MG tablet Take 500 mg by mouth 2 (two) times daily.   Yes Historical Provider, MD  lisinopril (PRINIVIL,ZESTRIL) 10 MG tablet Take 1 tablet (10 mg total) by mouth daily. 09/06/16 01/31/17 Yes Jonelle Sidle, MD  metoprolol tartrate (LOPRESSOR) 25 MG tablet Take 1 tablet (25 mg total) by mouth 2 (two) times daily. 09/06/16  Yes Jonelle Sidle, MD  nitroGLYCERIN (NITROSTAT) 0.4 MG SL tablet Place 1 tablet (0.4 mg total) under the tongue every 5 (five) minutes as needed. Patient taking differently: Place 0.4 mg under the tongue every 5 (five) minutes as needed for chest pain.  09/03/14  Yes  Jonelle Sidle, MD  simvastatin (ZOCOR) 20 MG tablet Take 1 tablet (20 mg total) by mouth daily. 09/06/16  Yes Jonelle Sidle, MD  tiZANidine (ZANAFLEX) 4 MG tablet Take 1 tablet (4 mg total) by mouth every 8 (eight) hours as needed for muscle spasms. 01/02/17  Yes Kathryne Hitch, MD  warfarin (COUMADIN) 5 MG tablet TAKE ONE & ONE-HALF TABLETS BY MOUTH ONCE DAILY EXCEPT  ON  SUNDAYS  AND  THURSDAYS  TAKE  1  TABLET 11/12/16  Yes Jonelle Sidle, MD  HYDROcodone-acetaminophen (NORCO) 5-325 MG tablet Take 2 tablets by mouth every 4 (four) hours as needed. 01/31/17   Mancel Bale, MD  tamsulosin [facility]) 0.4 MG CAPS capsule 1 q HS to aid stone passage 01/31/17   Mancel Bale, MD    Family History Family History  Problem Relation Age of Onset  . Hypertension Mother   . Diabetes Mother     Social History Social History  Substance Use Topics  . Smoking status: Current Every Day Smoker    Packs/day: 0.25    Years: 30.00    Types: Cigarettes  . Smokeless tobacco: Never Used  . Alcohol use No     Comment: hx of alcohol use      Allergies   Benadryl [diphenhydramine] and Chantix [varenicline]   Review of Systems Review of Systems  Gastrointestinal: Positive for diarrhea and vomiting. Negative for blood in stool.  Genitourinary: Positive for flank pain and hematuria. Negative for dysuria.  All other systems reviewed and are negative.    Physical Exam Updated Vital Signs BP 174/93   Pulse (!) 50   Temp 97.7 F (36.5 C) (Oral)   Resp 21   Ht 5\' 7"  (1.702 m)   Wt 160 lb (72.6 kg)   SpO2 98%   BMI 25.06 kg/m   Physical Exam  Constitutional: He is oriented to person, place, and time. He appears well-developed and well-nourished.  HENT:  Head: Normocephalic and atraumatic.  Right Ear: External ear normal.  Left Ear: External ear normal.  Eyes: Conjunctivae and EOM are normal. Pupils are equal, round, and reactive to light.  Neck: Normal range of motion and  phonation normal. Neck supple.  Cardiovascular: Normal rate, regular rhythm and normal heart sounds.   Pulmonary/Chest: Effort normal and breath sounds normal. He exhibits no bony tenderness.  Abdominal: There is tenderness.  Diffusely tender abdomen.  Genitourinary:  Genitourinary Comments: Right costovertebral angle tenderness with percussion.  Musculoskeletal: Normal range of motion.  Neurological: He is alert and oriented to person, place, and time. No cranial nerve deficit or sensory deficit. He exhibits normal muscle tone. Coordination normal.  Skin: Skin is warm, dry and intact.  Psychiatric: He has a normal mood and affect. His behavior is normal. Judgment and thought content normal.  Nursing note and vitals reviewed.    ED Treatments / Results  DIAGNOSTIC STUDIES: Oxygen Saturation is 98% on RA, normal by my interpretation.  COORDINATION OF CARE: 8:26 AM Discussed treatment plan with pt at bedside, which includes CT renal and labs, and pt agreed to plan.  Labs (all labs ordered are listed, but only abnormal results are displayed) Labs Reviewed  URINALYSIS, ROUTINE W REFLEX MICROSCOPIC - Abnormal; Notable for the following:       Result Value   APPearance HAZY (*)    Hgb urine dipstick LARGE (*)    Protein, ur 100 (*)    Bacteria, UA RARE (*)    All other components within normal limits  BASIC METABOLIC PANEL - Abnormal; Notable for the following:    Glucose, Bld 133 (*)    Creatinine, Ser 1.44 (*)    GFR calc non Af Amer 55 (*)    All other components within normal limits  CBC WITH DIFFERENTIAL/PLATELET    EKG  EKG Interpretation None       Radiology Ct Renal Stone Study  Result Date: 01/31/2017 CLINICAL DATA:  Right flank pain starting this morning. EXAM: CT ABDOMEN AND PELVIS WITHOUT CONTRAST TECHNIQUE: Multidetector CT imaging of the abdomen and pelvis was performed following the standard protocol without IV contrast. COMPARISON:  05/15/2016 FINDINGS:  Lower chest: Calcified mass at the left left ventricular cardiac apex with focal convexity, stable and likely due to a small pseudoaneurysm with chronically calcified mural thrombus. On the echocardiogram from 12/28/2015, apical thrombus was not seen although it is noted that the region was suboptimally visualized. A small amount of apical thrombus was seen on the echocardiogram from 2011. On today' s exam the region of rim calcification along the cardiac apex measures 3.8 by 2.7 by at least 4.3 cm. 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 There is atherosclerotic calcification involving the circumflex coronary artery. The upper margin of the calcified left ventricular apical processes non fully included. Hepatobiliary: Unremarkable Pancreas: Unremarkable Spleen: Unremarkable Adrenals/Urinary Tract: Adrenal glands unremarkable. Mild right hydronephrosis and right hydroureter extending down to the distal ureter. The distal ureter and UVJ are not visible due to severe streak artifact from patient's bilateral hip implants. I cannot exclude a right UVJ stone right bladder calculus. No left hydronephrosis. No renal calculi are seen in the collecting systems. Stomach/Bowel: Unremarkable Vascular/Lymphatic: Aortoiliac atherosclerotic vascular disease. 8 mm peripancreatic lymph node image 25/2. 8 mm in short axis aortocaval lymph node, image 40/2. No overtly pathologic adenopathy identified. Reproductive: Prostate gland obscured by streak artifact from the hip implants. Other: No supplemental non-categorized findings. Musculoskeletal: Bilateral hip prostheses. Prior median sternotomy. Lower lumbar spondylosis. IMPRESSION: 1. There is new right hydronephrosis and right hydroureter extending down to the distal ureter. The distal most ureter an urinary bladder are obscured by streak artifact from the hip implants, and accordingly I cannot exclude a right UVJ stone  or other cause for distal right obstruction. 2. 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 If further workup is warranted, gated cardiac MRI might be considered. There is also evidence of coronary artery atherosclerosis. 3.  Aortoiliac atherosclerotic vascular disease. Electronically Signed   By: Gaylyn Rong M.D.   On: 01/31/2017 10:16    Procedures Procedures (including critical care time)  Medications Ordered in ED Medications  ondansetron (ZOFRAN) injection 4 mg (4 mg Intravenous Given 01/31/17 0846)  ketorolac (TORADOL) 30 MG/ML  injection 30 mg (30 mg Intravenous Given 01/31/17 0846)     Initial Impression / Assessment and Plan / ED Course  I have reviewed the triage vital signs and the nursing notes.  Pertinent labs & imaging results that were available during my care of the patient were reviewed by me and considered in my medical decision making (see chart for details).     Medications  ondansetron (ZOFRAN) injection 4 mg (4 mg Intravenous Given 01/31/17 0846)  ketorolac (TORADOL) 30 MG/ML injection 30 mg (30 mg Intravenous Given 01/31/17 0846)    Patient Vitals for the past 24 hrs:  BP Temp Temp src Pulse Resp SpO2 Height Weight  01/31/17 1200 174/93 - - (!) 50 21 98 % - -  01/31/17 1130 155/95 - - (!) 53 22 96 % - -  01/31/17 1100 149/86 - - (!) 50 19 97 % - -  01/31/17 1030 156/93 - - (!) 55 24 96 % - -  01/31/17 1000 148/84 - - (!) 58 20 93 % - -  01/31/17 0930 147/79 - - (!) 56 23 94 % - -  01/31/17 0900 150/87 - - (!) 50 18 99 % - -  01/31/17 0845 - - - (!) 48 22 99 % - -  01/31/17 0830 133/80 - - (!) 43 22 98 % - -  01/31/17 0819 158/86 97.7 F (36.5 C) Oral (!) 44 20 98 % 5\' 7"  (1.702 m) 160 lb (72.6 kg)    At D/C Reevaluation with update and discussion. After  initial assessment and treatment, an updated evaluation reveals Patient states his pain is better after the Toradol was given. Findings discussed with the patient and all questions were answered. Areli Jowett L     Final Clinical Impressions(s) / ED Diagnoses   Final diagnoses:  Hematuria, unspecified type  Hydroureter    Hematuria, without apparent infection, with dilated ureter, consistent with distal partially obstructing stone. The patient is anticoagulated, for cardio myopathy, with mural thrombus. He is hemodynamically stable. No indication for reversal of Coumadin anticoagulation at this time. Doubt renal failure, metabolic instability or serious bacterial infection. Patient does not have a prior history of kidney stones. However, this is the most likely cause.  Nursing Notes Reviewed/ Care Coordinated Applicable Imaging Reviewed Interpretation of Laboratory Data incorporated into ED treatment  The patient appears reasonably screened and/or stabilized for discharge and I doubt any other medical condition or other [facility] requiring further screening, evaluation, or treatment in the ED at this time prior to discharge.  Plan: Home Medications- continue; Home Treatments- strain urine; return here if the recommended treatment, does not improve the symptoms; Recommended follow up- PCP prn. Urology 1 week     New Prescriptions Discharge Medication List as of 01/31/2017 12:17 PM    START taking these medications   Details  HYDROcodone-acetaminophen (NORCO) 5-325 MG tablet Take 2 tablets by mouth every 4 (four) hours as needed., Starting Thu 01/31/2017, Print    tamsulosin (FLOMAX) 0.4 MG CAPS capsule 1 q HS to aid stone passage, Print        I personally performed the services described in this documentation, which was scribed in my presence. The recorded information has been reviewed and is accurate.    Mancel Bale, MD 01/31/17 [phone]

## 2017-01-31 NOTE — Discharge Instructions (Signed)
Strain the urine, to capture a stone.  Continue drinking plenty of water.  Call urologist, to schedule a follow-up appointment.

## 2017-03-13 ENCOUNTER — Ambulatory Visit (INDEPENDENT_AMBULATORY_CARE_PROVIDER_SITE_OTHER): Payer: Medicaid Other | Admitting: *Deleted

## 2017-03-13 DIAGNOSIS — I513 Intracardiac thrombosis, not elsewhere classified: Secondary | ICD-10-CM

## 2017-03-13 DIAGNOSIS — Z5181 Encounter for therapeutic drug level monitoring: Secondary | ICD-10-CM

## 2017-03-13 LAB — POCT INR: INR: 2.2

## 2017-04-24 ENCOUNTER — Ambulatory Visit (INDEPENDENT_AMBULATORY_CARE_PROVIDER_SITE_OTHER): Payer: Medicaid Other | Admitting: *Deleted

## 2017-04-24 DIAGNOSIS — I513 Intracardiac thrombosis, not elsewhere classified: Secondary | ICD-10-CM

## 2017-04-24 DIAGNOSIS — Z5181 Encounter for therapeutic drug level monitoring: Secondary | ICD-10-CM

## 2017-04-24 DIAGNOSIS — I219 Acute myocardial infarction, unspecified: Secondary | ICD-10-CM | POA: Diagnosis not present

## 2017-04-24 LAB — POCT INR: INR: 2

## 2017-06-05 ENCOUNTER — Ambulatory Visit (INDEPENDENT_AMBULATORY_CARE_PROVIDER_SITE_OTHER): Payer: Medicaid Other | Admitting: *Deleted

## 2017-06-05 DIAGNOSIS — Z5181 Encounter for therapeutic drug level monitoring: Secondary | ICD-10-CM | POA: Diagnosis not present

## 2017-06-05 DIAGNOSIS — I513 Intracardiac thrombosis, not elsewhere classified: Secondary | ICD-10-CM | POA: Diagnosis not present

## 2017-06-05 LAB — POCT INR: INR: 3.6

## 2017-06-20 ENCOUNTER — Other Ambulatory Visit: Payer: Self-pay | Admitting: Cardiology

## 2017-06-24 ENCOUNTER — Other Ambulatory Visit: Payer: Self-pay | Admitting: *Deleted

## 2017-06-24 MED ORDER — METOPROLOL TARTRATE 25 MG PO TABS: 25.0000 mg | ORAL_TABLET | Freq: Two times a day (BID) | ORAL | 3 refills | Status: DC

## 2017-06-24 NOTE — Telephone Encounter (Signed)
Refill on metoprolol to [facility]  / tg

## 2017-06-24 NOTE — Telephone Encounter (Signed)
Refill completed.

## 2017-06-26 ENCOUNTER — Ambulatory Visit (INDEPENDENT_AMBULATORY_CARE_PROVIDER_SITE_OTHER): Payer: Medicaid Other | Admitting: *Deleted

## 2017-06-26 DIAGNOSIS — I513 Intracardiac thrombosis, not elsewhere classified: Secondary | ICD-10-CM | POA: Diagnosis not present

## 2017-06-26 DIAGNOSIS — Z5181 Encounter for therapeutic drug level monitoring: Secondary | ICD-10-CM | POA: Diagnosis not present

## 2017-06-26 LAB — POCT INR: INR: 3.9

## 2017-07-02 ENCOUNTER — Ambulatory Visit (INDEPENDENT_AMBULATORY_CARE_PROVIDER_SITE_OTHER): Payer: Medicaid Other

## 2017-07-02 ENCOUNTER — Ambulatory Visit (INDEPENDENT_AMBULATORY_CARE_PROVIDER_SITE_OTHER): Payer: Medicaid Other | Admitting: Orthopaedic Surgery

## 2017-07-02 DIAGNOSIS — Z96643 Presence of artificial hip joint, bilateral: Secondary | ICD-10-CM | POA: Diagnosis not present

## 2017-07-02 NOTE — Progress Notes (Signed)
Office Visit Note   Patient: Steve Vang           Date of Birth: Feb 06, 1966           MRN: [id number] Visit Date: 07/02/2017              Requested by: Oval Linsey, MD [address] [place], [place] [address] PCP: Oval Linsey, MD   Assessment & Plan: Visit Diagnoses:  1. History of total replacement of both hip joints     Plan: At this point is nothing else I would recommend for his hips. This is doing very well. The clicking is not really causing any pain or problems. I would not need to x-ray these hips again unless she is having any issues and I talked to him in length about we'll bring him back for his hips. As far as his right upper extremity goes, if the numbness persists I would order nerve conduction velocity studies. He will call us if it becomes an issue and I would be my next step for him would be nerve conduction studies. All questions were encouraged and answered.  Follow-Up Instructions: Return if symptoms worsen or fail to improve.   Orders:  Orders Placed This Encounter  Procedures  . XR Pelvis 1-2 Views   No orders of the defined types were placed in this encounter.     Procedures: No procedures performed   Clinical Data: No additional findings.   Subjective: No chief complaint on file. The patient is status post bilateral hip replacements were done about 2 months apart. He is just had a year out for both these hips. Hips are doing well. The right one clicks on occasion but not all the time. He does report right hand numbness is new and he says only occurs when he sits down the numbness goes away. He said this woman going on for a few weeks. Otherwise she is back to full work duties and has no other significant complaints. He has no pain in the groin on both sides. He feels like his leg lengths are equal.  HPI  Review of Systems Other than recent hand numbness he denies any chest pain, short of breath, neck pain, headache, fever, chills,  nausea, vomiting.  Objective: Vital Signs: There were no vitals taken for this visit.  Physical Exam He is alert and oriented 3 and in no acute distress he ambulates with no limp and without assistive device Ortho Exam Examination of both hips show fluid range of motion actively and passively. I could not elicit any type of clicking of the right hip. His leg lengths are equal. His incisions well-healed. His neurovascular intact. Examination of his right upper extremity shows no weakness in his grip or pinch strength. His hands well-perfused. There is normal sensation today. Specialty Comments:  No specialty comments available.  Imaging: Xr Pelvis 1-2 Views  Result Date: 07/02/2017 An x-ray of his pelvis shows well seated implants for bilateral total hip arthroplasties. There is no evidence of loosening or compromise getting features. 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    PMFS History: Patient Active Problem List   Diagnosis Date Noted  . Avascular necrosis of bone of right hip (HCC) 07/20/2016  . 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 Avascular necrosis of bones of both hips (HCC) 06/08/2016  . Status post total replacement of left hip 06/08/2016  .  AKI (acute kidney injury) (HCC) 05/15/2016  . Nausea and vomiting 05/15/2016  . Abdominal pain 05/15/2016  . Chest pain 05/15/2016  . Emesis   . Encounter for therapeutic drug monitoring 03/03/2014  . Long term (current) use of anticoagulants 04/16/2011  . GAIT IMBALANCE 02/02/2011  . ABNORMAL CV (STRESS) TEST 10/02/2010  . Mural thrombus of left ventricle (HCC) 09/08/2010  . OTHER SPEC FORMS CHRONIC ISCHEMIC HEART DISEASE 06/07/2010  . Mixed hyperlipidemia 03/02/2010  . TOBACCO ABUSE 03/02/2010  . Essential hypertension, benign 03/02/2010  . CORONARY ATHEROSCLEROSIS NATIVE CORONARY ARTERY 03/02/2010  . CVA 03/01/2010   Past Medical History:    Diagnosis Date  . CAD (coronary artery disease)    Multivessel, LVEF 45-50%  . Cardiomyopathy (HCC)    LVEF 50-55% December 2016  . CKD (chronic kidney disease) stage 2, GFR 60-89 ml/min   . COPD (chronic obstructive pulmonary disease) (HCC)   . Depression   . Essential hypertension   . Falls   . Gallstone   . Gastroenteritis   . History of stroke 2004  . Hyperlipidemia   . Left ventricular mural thrombus    On Coumadin    Family History  Problem Relation Age of Onset  . Hypertension Mother   . Diabetes Mother     Past Surgical History:  Procedure Laterality Date  . CORONARY ARTERY BYPASS GRAFT     DOR anterior ventricular restoration surgery 8/06, [facility] [place] to first diagonal, SVG to PLB, SVG to RVE branch of nondominant RCA  . EYE SURGERY    . TOTAL HIP ARTHROPLASTY Left 06/08/2016   Procedure: TOTAL HIP ARTHROPLASTY ANTERIOR APPROACH;  Surgeon: Kathryne Hitch, MD;  Location: [facility];  Service: Orthopedics;  Laterality: Left;  . TOTAL HIP ARTHROPLASTY Right 07/20/2016   Procedure: RIGHT TOTAL HIP ARTHROPLASTY ANTERIOR APPROACH;  Surgeon: Kathryne Hitch, MD;  Location: [facility];  Service: Orthopedics;  Laterality: Right;   Social History   Occupational History  . Not on file.   Social History Main Topics  . Smoking status: Current Every Day Smoker    Packs/day: 0.25    Years: 30.00    Types: Cigarettes  . Smokeless tobacco: Never Used  . Alcohol use No     Comment: hx of alcohol use   . Drug use: Yes    Types: Marijuana     Comment:  marijuana use  . Sexual activity: Yes    Partners: Female

## 2017-07-17 ENCOUNTER — Telehealth: Payer: Self-pay | Admitting: Cardiology

## 2017-07-17 ENCOUNTER — Ambulatory Visit (INDEPENDENT_AMBULATORY_CARE_PROVIDER_SITE_OTHER): Payer: Medicaid Other | Admitting: *Deleted

## 2017-07-17 DIAGNOSIS — Z5181 Encounter for therapeutic drug level monitoring: Secondary | ICD-10-CM | POA: Diagnosis not present

## 2017-07-17 DIAGNOSIS — I513 Intracardiac thrombosis, not elsewhere classified: Secondary | ICD-10-CM | POA: Diagnosis not present

## 2017-07-17 LAB — POCT INR: INR: 2.9

## 2017-07-17 MED ORDER — SIMVASTATIN 20 MG PO TABS: 20.0000 mg | ORAL_TABLET | Freq: Every day | ORAL | 6 refills | Status: DC

## 2017-07-17 NOTE — Telephone Encounter (Signed)
Sent in Refills for  Simvistatin.

## 2017-07-17 NOTE — Telephone Encounter (Signed)
°*  STAT* If patient is at the pharmacy, call can be transferred to refill team.   1. Which medications need to be refilled? (please list name of each medication and dose if known) Simvastatin 20mg   2. Which pharmacy/location (including street and city if local pharmacy) is medication to be sent to?[facility]  3. Do they need a 30 day or 90 day supply? 30 day

## 2017-08-14 ENCOUNTER — Ambulatory Visit (INDEPENDENT_AMBULATORY_CARE_PROVIDER_SITE_OTHER): Payer: Medicaid Other | Admitting: *Deleted

## 2017-08-14 DIAGNOSIS — I513 Intracardiac thrombosis, not elsewhere classified: Secondary | ICD-10-CM | POA: Diagnosis not present

## 2017-08-14 DIAGNOSIS — Z5181 Encounter for therapeutic drug level monitoring: Secondary | ICD-10-CM | POA: Diagnosis not present

## 2017-08-14 LAB — POCT INR: INR: 4

## 2017-09-09 ENCOUNTER — Ambulatory Visit (INDEPENDENT_AMBULATORY_CARE_PROVIDER_SITE_OTHER): Payer: Medicaid Other | Admitting: *Deleted

## 2017-09-09 DIAGNOSIS — Z5181 Encounter for therapeutic drug level monitoring: Secondary | ICD-10-CM

## 2017-09-09 DIAGNOSIS — I513 Intracardiac thrombosis, not elsewhere classified: Secondary | ICD-10-CM | POA: Diagnosis not present

## 2017-09-09 DIAGNOSIS — I240 Acute coronary thrombosis not resulting in myocardial infarction: Secondary | ICD-10-CM

## 2017-09-09 LAB — POCT INR: INR: 3

## 2017-09-26 ENCOUNTER — Other Ambulatory Visit: Payer: Self-pay | Admitting: Cardiology

## 2017-10-07 ENCOUNTER — Ambulatory Visit (INDEPENDENT_AMBULATORY_CARE_PROVIDER_SITE_OTHER): Payer: Medicaid Other | Admitting: *Deleted

## 2017-10-07 DIAGNOSIS — Z5181 Encounter for therapeutic drug level monitoring: Secondary | ICD-10-CM

## 2017-10-07 DIAGNOSIS — I513 Intracardiac thrombosis, not elsewhere classified: Secondary | ICD-10-CM | POA: Diagnosis not present

## 2017-10-07 LAB — POCT INR: INR: 4.3

## 2017-10-21 ENCOUNTER — Ambulatory Visit (INDEPENDENT_AMBULATORY_CARE_PROVIDER_SITE_OTHER): Payer: Medicaid Other | Admitting: *Deleted

## 2017-10-21 DIAGNOSIS — I6350 Cerebral infarction due to unspecified occlusion or stenosis of unspecified cerebral artery: Secondary | ICD-10-CM | POA: Diagnosis not present

## 2017-10-21 DIAGNOSIS — I513 Intracardiac thrombosis, not elsewhere classified: Secondary | ICD-10-CM

## 2017-10-21 DIAGNOSIS — Z5181 Encounter for therapeutic drug level monitoring: Secondary | ICD-10-CM | POA: Diagnosis not present

## 2017-10-21 LAB — POCT INR: INR: 2.9

## 2017-10-24 ENCOUNTER — Telehealth: Payer: Self-pay | Admitting: Cardiology

## 2017-10-24 MED ORDER — LISINOPRIL 10 MG PO TABS: 10.0000 mg | ORAL_TABLET | Freq: Every day | ORAL | 3 refills | Status: DC

## 2017-10-24 NOTE — Telephone Encounter (Signed)
Patient needs refill on Lisinopril sent to [facility] / tg

## 2017-10-24 NOTE — Telephone Encounter (Signed)
Refilled med

## 2017-11-18 ENCOUNTER — Ambulatory Visit (INDEPENDENT_AMBULATORY_CARE_PROVIDER_SITE_OTHER): Payer: Medicaid Other | Admitting: *Deleted

## 2017-11-18 DIAGNOSIS — I240 Acute coronary thrombosis not resulting in myocardial infarction: Secondary | ICD-10-CM

## 2017-11-18 DIAGNOSIS — Z5181 Encounter for therapeutic drug level monitoring: Secondary | ICD-10-CM | POA: Diagnosis not present

## 2017-11-18 DIAGNOSIS — I513 Intracardiac thrombosis, not elsewhere classified: Secondary | ICD-10-CM | POA: Diagnosis not present

## 2017-11-18 LAB — POCT INR: INR: 3.2

## 2017-12-16 ENCOUNTER — Ambulatory Visit (INDEPENDENT_AMBULATORY_CARE_PROVIDER_SITE_OTHER): Payer: Medicaid Other | Admitting: *Deleted

## 2017-12-16 DIAGNOSIS — I513 Intracardiac thrombosis, not elsewhere classified: Secondary | ICD-10-CM | POA: Diagnosis not present

## 2017-12-16 DIAGNOSIS — Z5181 Encounter for therapeutic drug level monitoring: Secondary | ICD-10-CM | POA: Diagnosis not present

## 2017-12-16 DIAGNOSIS — I517 Cardiomegaly: Secondary | ICD-10-CM | POA: Diagnosis not present

## 2017-12-16 LAB — POCT INR: INR: 1.8

## 2017-12-20 ENCOUNTER — Other Ambulatory Visit: Payer: Self-pay | Admitting: Cardiology

## 2018-01-09 ENCOUNTER — Other Ambulatory Visit: Payer: Self-pay | Admitting: Cardiology

## 2018-01-09 ENCOUNTER — Telehealth: Payer: Self-pay | Admitting: Cardiology

## 2018-01-09 NOTE — Telephone Encounter (Signed)
°*  STAT* If patient is at the pharmacy, call can be transferred to refill team.   1. Which medications need to be refilled? (please list name of each medication and dose if known) metoprolol tartrate (LOPRESSOR) 25 MG tablet [[id number]]    2. Which pharmacy/location (including street and city if local pharmacy) is medication to be sent to? [facility]  3. Do they need a 30 day or 90 day supply?  90  Pt is scheduled to see SM on 02/07/18

## 2018-01-13 ENCOUNTER — Ambulatory Visit (INDEPENDENT_AMBULATORY_CARE_PROVIDER_SITE_OTHER): Payer: Medicaid Other | Admitting: *Deleted

## 2018-01-13 DIAGNOSIS — I513 Intracardiac thrombosis, not elsewhere classified: Secondary | ICD-10-CM | POA: Diagnosis not present

## 2018-01-13 DIAGNOSIS — Z5181 Encounter for therapeutic drug level monitoring: Secondary | ICD-10-CM

## 2018-01-13 LAB — POCT INR: INR: 1.9

## 2018-01-13 NOTE — Patient Instructions (Signed)
Increase coumadin to 1 1/2 tablets daily except 1 tablet on Sundays  Continue greens  Recheck in 3 weeks

## 2018-02-03 ENCOUNTER — Ambulatory Visit (INDEPENDENT_AMBULATORY_CARE_PROVIDER_SITE_OTHER): Payer: Medicaid Other | Admitting: *Deleted

## 2018-02-03 DIAGNOSIS — Z5181 Encounter for therapeutic drug level monitoring: Secondary | ICD-10-CM | POA: Diagnosis not present

## 2018-02-03 DIAGNOSIS — I513 Intracardiac thrombosis, not elsewhere classified: Secondary | ICD-10-CM

## 2018-02-03 LAB — POCT INR: INR: 1.9

## 2018-02-03 NOTE — Patient Instructions (Signed)
Take coumadin 2 1/2 tablets tonight then increase dose to 1 1/2 tablets daily  Continue greens  Recheck in 3 weeks

## 2018-02-07 ENCOUNTER — Ambulatory Visit: Payer: Medicaid Other | Admitting: Cardiology

## 2018-02-25 NOTE — Progress Notes (Signed)
Cardiology Office Note  Date: 02/26/2018   ID: Steve Vang, DOB November 27, 1966, MRN [id number]  PCP: Oval Linsey, MD  Primary Cardiologist: [name], MD   Chief Complaint  Patient presents with  . Coronary Artery Disease    History of Present Illness: Steve Vang is a 52 y.o. male last seen in September 2017.  He presents overdue for a follow-up visit.  Fortunately, reports no interval angina symptoms or increasing nitroglycerin use.  He states that he has been taking his medications regularly.  He continues on Coumadin with follow-up in the anticoagulation clinic.  INR today was 2.8.  He does not report any spontaneous bleeding problems.  Echocardiogram from 2016 is reviewed below.  His current cardiac regimen includes aspirin, Coumadin, Zocor, Lopressor, lisinopril, and as needed nitroglycerin.  We discussed obtaining a follow-up lipid panel.  I personally reviewed his ECG today which shows sinus rhythm with previous anterolateral infarct pattern and residual persistent ST segment abnormalities.  Past Medical History:  Diagnosis Date  . CAD (coronary artery disease)    Multivessel, LVEF 45-50%  . Cardiomyopathy (HCC)    LVEF 50-55% December 2016  . CKD (chronic kidney disease) stage 2, GFR 60-89 ml/min   . COPD (chronic obstructive pulmonary disease) (HCC)   . Depression   . Essential hypertension   . Falls   . Gallstone   . Gastroenteritis   . History of stroke 2004  . Hyperlipidemia   . Left ventricular mural thrombus    On Coumadin    Past Surgical History:  Procedure Laterality Date  . CORONARY ARTERY BYPASS GRAFT     DOR anterior ventricular restoration surgery 8/06, [facility] [place] to first diagonal, SVG to PLB, SVG to RVE branch of nondominant RCA  . EYE SURGERY    . TOTAL HIP ARTHROPLASTY Left 06/08/2016   Procedure: TOTAL HIP ARTHROPLASTY ANTERIOR APPROACH;  Surgeon: Kathryne Hitch, MD;  Location: [facility];  Service: Orthopedics;   Laterality: Left;  . TOTAL HIP ARTHROPLASTY Right 07/20/2016   Procedure: RIGHT TOTAL HIP ARTHROPLASTY ANTERIOR APPROACH;  Surgeon: Kathryne Hitch, MD;  Location: [facility];  Service: Orthopedics;  Laterality: Right;    Current Outpatient Medications  Medication Sig Dispense Refill  . aspirin EC 81 MG tablet Take 81 mg by mouth daily.    . ciprofloxacin (CIPRO) 500 MG tablet Take 500 mg by mouth 2 (two) times daily.    Marland Kitchen HYDROcodone-acetaminophen (NORCO) 5-325 MG tablet Take 2 tablets by mouth every 4 (four) hours as needed. 20 tablet 0  . lisinopril (PRINIVIL,ZESTRIL) 10 MG tablet Take 1 tablet (10 mg total) by mouth daily. 90 tablet 3  . metoprolol tartrate (LOPRESSOR) 25 MG tablet TAKE 1 TABLET BY MOUTH TWICE DAILY 60 tablet 3  . nitroGLYCERIN (NITROSTAT) 0.4 MG SL tablet Place 1 tablet (0.4 mg total) under the tongue every 5 (five) minutes as needed. (Patient taking differently: Place 0.4 mg under the tongue every 5 (five) minutes as needed for chest pain. ) 25 tablet 3  . simvastatin (ZOCOR) 20 MG tablet Take 1 tablet (20 mg total) by mouth daily. 30 tablet 6  . tamsulosin (FLOMAX) 0.4 MG CAPS capsule 1 q HS to aid stone passage 7 capsule 0  . tiZANidine (ZANAFLEX) 4 MG tablet Take 1 tablet (4 mg total) by mouth every 8 (eight) hours as needed for muscle spasms. 90 tablet 0  . warfarin (COUMADIN) 5 MG tablet TAKE ONE & ONE-HALF TABLETS BY MOUTH  DAILY EXCEPT  ON  SUNDAYS  AND  THURSDAYS  TAKE  ONE  TABLET. 40 tablet 3   No current facility-administered medications for this visit.    Allergies:  Benadryl [diphenhydramine] and Chantix [varenicline]   Social History: The patient  reports that he has been smoking cigarettes.  He has a 7.50 pack-year smoking history. he has never used smokeless tobacco. He reports that he uses drugs. Drug: Marijuana. He reports that he does not drink alcohol.   ROS:  Please see the history of present illness. Otherwise, complete review of systems is  positive for chronic back pain.  All other systems are reviewed and negative.   Physical Exam: VS:  BP 134/84 (BP Location: Left Arm)   Pulse 62   Ht 5\' 6"  (1.676 m)   Wt 168 lb (76.2 kg)   SpO2 96%   BMI 27.12 kg/m , BMI Body mass index is 27.12 kg/m.  Wt Readings from Last 3 Encounters:  02/26/18 168 lb (76.2 kg)  01/31/17 160 lb (72.6 kg)  09/06/16 158 lb (71.7 kg)    General: Chronically ill-appearing male, appears comfortable at rest. HEENT: Conjunctiva and lids normal, oropharynx clear with poor dentition. Neck: Supple, no elevated JVP or carotid bruits, no thyromegaly. Lungs: Clear to auscultation, nonlabored breathing at rest. Cardiac: Regular rate and rhythm, no S3 or significant systolic murmur, no pericardial rub. Abdomen: Soft, nontender, bowel sounds present. Extremities: No pitting edema, distal pulses 2+. Skin: Warm and dry. Musculoskeletal: No kyphosis. Neuropsychiatric: Alert and oriented x3, affect grossly appropriate.  ECG: I personally reviewed the tracing from 05/16/2016 which showed sinus rhythm with probable old anteroseptal and high lateral infarct pattern, diffuse nonspecific ST changes.  Recent Labwork: No results found for requested labs within last 8760 hours.     Component Value Date/Time   CHOL 156 03/28/2011 0829   TRIG 100 03/28/2011 0829   HDL 40 03/28/2011 0829   CHOLHDL 3.9 03/28/2011 0829   VLDL 20 03/28/2011 0829   LDLCALC 96 03/28/2011 0829    Other Studies Reviewed Today:  Echocardiogram 12/28/2015: Study Conclusions  - Left ventricle: The cavity size was normal. There was mild   concentric hypertrophy. Systolic function was normal. The   estimated ejection fraction was in the range of 50% to 55%.   Features are consistent with a pseudonormal left ventricular   filling pattern, with concomitant abnormal relaxation and   increased filling pressure (grade 2 diastolic dysfunction).   Doppler parameters are consistent with high  ventricular filling   pressure. - Regional wall motion abnormality: Akinesis of the mid   anteroseptal myocardium; mild hypokinesis of the apical septal   myocardium; probable mild hypokinesis of the apical myocardium. - Aortic valve: Mildly to moderately calcified annulus. - Mitral valve: Calcified annulus. Mildly thickened leaflets . - Right ventricle: Systolic function was mildly reduced.  Impressions:  - While the apical endocardium was suboptimally visualized, there   was no obvious thrombus seen.  Assessment and Plan:  1.  CAD status post CABG, stable at this time without significant angina symptoms on medical therapy.  ECG reviewed with chronic abnormalities.  2.  Ischemic cardiomyopathy with LV mural thrombus.  Last echocardiogram demonstrated LVEF 50-55% range.  He continues on both aspirin and Coumadin with previous history of stroke.  3.  Essential hypertension, blood pressure control is adequate today.  Continue with current medications.  Encouraged follow-up with Dr. Janna Arch.  4.  Mixed hyperlipidemia, continues on Zocor.  He has not  had recent FLP or LFTs, these will be arranged.  5.  Tobacco abuse, we have discussed smoking cessation but he has not been motivated to quit.  Current medicines were reviewed with the patient today.   Orders Placed This Encounter  Procedures  . Hepatic function panel  . Lipid Profile  . EKG 12-Lead    Disposition: Follow-up in 6 months.  Signed, Jonelle Sidle, MD, [facility] 02/26/2018 9:23 AM    [facility] [address], [place], [place] [address] Phone: [phone]; Fax: [phone]

## 2018-02-26 ENCOUNTER — Ambulatory Visit (INDEPENDENT_AMBULATORY_CARE_PROVIDER_SITE_OTHER): Payer: Medicaid Other | Admitting: *Deleted

## 2018-02-26 ENCOUNTER — Encounter: Payer: Self-pay | Admitting: Cardiology

## 2018-02-26 ENCOUNTER — Ambulatory Visit (INDEPENDENT_AMBULATORY_CARE_PROVIDER_SITE_OTHER): Payer: Medicaid Other | Admitting: Cardiology

## 2018-02-26 VITALS — BP 134/84 | HR 62 | Ht 66.0 in | Wt 168.0 lb

## 2018-02-26 DIAGNOSIS — E782 Mixed hyperlipidemia: Secondary | ICD-10-CM

## 2018-02-26 DIAGNOSIS — Z720 Tobacco use: Secondary | ICD-10-CM

## 2018-02-26 DIAGNOSIS — I2510 Atherosclerotic heart disease of native coronary artery without angina pectoris: Secondary | ICD-10-CM

## 2018-02-26 DIAGNOSIS — Z5181 Encounter for therapeutic drug level monitoring: Secondary | ICD-10-CM | POA: Diagnosis not present

## 2018-02-26 DIAGNOSIS — I255 Ischemic cardiomyopathy: Secondary | ICD-10-CM | POA: Diagnosis not present

## 2018-02-26 DIAGNOSIS — I513 Intracardiac thrombosis, not elsewhere classified: Secondary | ICD-10-CM

## 2018-02-26 DIAGNOSIS — I10 Essential (primary) hypertension: Secondary | ICD-10-CM | POA: Diagnosis not present

## 2018-02-26 LAB — POCT INR: INR: 2.8

## 2018-02-26 NOTE — Patient Instructions (Signed)
Medication Instructions:  Your physician recommends that you continue on your current medications as directed. Please refer to the Current Medication list given to you today.   Labwork: FASTING LIVER & LIPIDS   Testing/Procedures: NONE  Follow-Up: Your physician wants you to follow-up in: 6 MONTHS .  You will receive a reminder letter in the mail two months in advance. If you don't receive a letter, please call our office to schedule the follow-up appointment.        Any Other Special Instructions Will Be Listed Below (If Applicable).     If you need a refill on your cardiac medications before your next appointment, please call your pharmacy.

## 2018-02-26 NOTE — Patient Instructions (Signed)
Continue coumadin 1 1/2 tablets daily  Continue greens.   Recheck in 4 weeks.  

## 2018-02-28 LAB — HEPATIC FUNCTION PANEL
AG Ratio: 1.6 (calc) (ref 1.0–2.5)
ALBUMIN MSPROF: 4.3 g/dL (ref 3.6–5.1)
ALKALINE PHOSPHATASE (APISO): 97 U/L (ref 40–115)
ALT: 29 U/L (ref 9–46)
AST: 19 U/L (ref 10–35)
BILIRUBIN DIRECT: 0.1 mg/dL (ref 0.0–0.2)
Globulin: 2.7 g/dL (calc) (ref 1.9–3.7)
Indirect Bilirubin: 0.3 mg/dL (calc) (ref 0.2–1.2)
Total Bilirubin: 0.4 mg/dL (ref 0.2–1.2)
Total Protein: 7 g/dL (ref 6.1–8.1)

## 2018-02-28 LAB — LIPID PANEL
CHOLESTEROL: 238 mg/dL — AB (ref <200)
HDL: 41 mg/dL (ref >40)
LDL CHOLESTEROL (CALC): 158 mg/dL — AB
Non-HDL Cholesterol (Calc): 197 mg/dL (calc) — ABNORMAL HIGH (ref <130)
Total CHOL/HDL Ratio: 5.8 (calc) — ABNORMAL HIGH (ref <5.0)
Triglycerides: 231 mg/dL — ABNORMAL HIGH (ref <150)

## 2018-03-03 ENCOUNTER — Telehealth: Payer: Self-pay

## 2018-03-03 DIAGNOSIS — E782 Mixed hyperlipidemia: Secondary | ICD-10-CM

## 2018-03-03 MED ORDER — SIMVASTATIN 40 MG PO TABS: 40.0000 mg | ORAL_TABLET | Freq: Every day | ORAL | 3 refills | Status: DC

## 2018-03-03 NOTE — Telephone Encounter (Signed)
-----   Message from Jonelle Sidle, MD sent at 03/03/2018  8:22 AM EST ----- Results reviewed.  For now increase Zocor to 40 mg daily.  He should have a follow-up FLP with LFTs for his next visit. A copy of this test should be forwarded to Oval Linsey, MD.

## 2018-03-03 NOTE — Telephone Encounter (Signed)
Pt aware and will increase zocor to 40 mg , I mailed lab slips, due in August

## 2018-03-17 ENCOUNTER — Other Ambulatory Visit: Payer: Self-pay | Admitting: Cardiology

## 2018-03-20 ENCOUNTER — Telehealth: Payer: Self-pay | Admitting: Cardiology

## 2018-03-20 MED ORDER — NITROGLYCERIN 0.4 MG SL SUBL: 0.4000 mg | SUBLINGUAL_TABLET | SUBLINGUAL | 3 refills | Status: DC | PRN

## 2018-03-20 NOTE — Telephone Encounter (Signed)
Done

## 2018-03-20 NOTE — Telephone Encounter (Signed)
Needs new RX for NTG sent to [facility] / [facility]

## 2018-03-26 ENCOUNTER — Ambulatory Visit (INDEPENDENT_AMBULATORY_CARE_PROVIDER_SITE_OTHER): Payer: Medicaid Other | Admitting: *Deleted

## 2018-03-26 DIAGNOSIS — Z5181 Encounter for therapeutic drug level monitoring: Secondary | ICD-10-CM | POA: Diagnosis not present

## 2018-03-26 DIAGNOSIS — I513 Intracardiac thrombosis, not elsewhere classified: Secondary | ICD-10-CM

## 2018-03-26 DIAGNOSIS — I6350 Cerebral infarction due to unspecified occlusion or stenosis of unspecified cerebral artery: Secondary | ICD-10-CM | POA: Diagnosis not present

## 2018-03-26 LAB — POCT INR: INR: 2.5

## 2018-03-26 NOTE — Patient Instructions (Signed)
Continue coumadin 1 1/2 tablets daily  Continue greens  Recheck in 5 weeks

## 2018-04-08 ENCOUNTER — Other Ambulatory Visit: Payer: Self-pay | Admitting: Cardiology

## 2018-04-08 MED ORDER — SIMVASTATIN 40 MG PO TABS: 40.0000 mg | ORAL_TABLET | Freq: Every day | ORAL | 3 refills | Status: DC

## 2018-04-08 NOTE — Addendum Note (Signed)
Addended by: Marlyn Corporal A on: 04/08/2018 08:52 AM   Modules accepted: Orders

## 2018-04-08 NOTE — Telephone Encounter (Signed)
Refilled zocor as requested

## 2018-04-08 NOTE — Telephone Encounter (Signed)
Needs refill on Simvastatin 40mg  sent to [facility] / [facility]

## 2018-04-30 ENCOUNTER — Ambulatory Visit (INDEPENDENT_AMBULATORY_CARE_PROVIDER_SITE_OTHER): Payer: Medicaid Other | Admitting: *Deleted

## 2018-04-30 DIAGNOSIS — Z5181 Encounter for therapeutic drug level monitoring: Secondary | ICD-10-CM

## 2018-04-30 DIAGNOSIS — I2129 ST elevation (STEMI) myocardial infarction involving other sites: Secondary | ICD-10-CM

## 2018-04-30 DIAGNOSIS — I513 Intracardiac thrombosis, not elsewhere classified: Secondary | ICD-10-CM

## 2018-04-30 LAB — POCT INR: INR: 4.1

## 2018-04-30 NOTE — Patient Instructions (Signed)
Hold coumadin tonight then resume 1 1/2 tablets daily  Continue greens.   Recheck in 3 weeks. 

## 2018-05-21 ENCOUNTER — Ambulatory Visit (INDEPENDENT_AMBULATORY_CARE_PROVIDER_SITE_OTHER): Payer: Medicaid Other | Admitting: *Deleted

## 2018-05-21 DIAGNOSIS — Z5181 Encounter for therapeutic drug level monitoring: Secondary | ICD-10-CM | POA: Diagnosis not present

## 2018-05-21 DIAGNOSIS — I513 Intracardiac thrombosis, not elsewhere classified: Secondary | ICD-10-CM | POA: Diagnosis not present

## 2018-05-21 DIAGNOSIS — I240 Acute coronary thrombosis not resulting in myocardial infarction: Secondary | ICD-10-CM

## 2018-05-21 LAB — POCT INR: INR: 2.2 (ref 2.0–3.0)

## 2018-05-21 NOTE — Patient Instructions (Signed)
Continue coumadin 1 1/2 tablets daily  Continue greens.   Recheck in 4 weeks.  

## 2018-06-02 ENCOUNTER — Other Ambulatory Visit: Payer: Self-pay | Admitting: Cardiology

## 2018-06-03 ENCOUNTER — Telehealth: Payer: Self-pay | Admitting: Cardiology

## 2018-06-03 MED ORDER — WARFARIN SODIUM 5 MG PO TABS: ORAL_TABLET | ORAL | 3 refills | Status: DC

## 2018-06-03 NOTE — Telephone Encounter (Signed)
Pt called stating he's out of his coumadin

## 2018-06-03 NOTE — Telephone Encounter (Signed)
Rx for warfarin sent to [facility] and pt notified.

## 2018-06-18 ENCOUNTER — Ambulatory Visit (INDEPENDENT_AMBULATORY_CARE_PROVIDER_SITE_OTHER): Payer: Medicaid Other | Admitting: *Deleted

## 2018-06-18 DIAGNOSIS — I513 Intracardiac thrombosis, not elsewhere classified: Secondary | ICD-10-CM | POA: Diagnosis not present

## 2018-06-18 DIAGNOSIS — Z5181 Encounter for therapeutic drug level monitoring: Secondary | ICD-10-CM | POA: Diagnosis not present

## 2018-06-18 DIAGNOSIS — I236 Thrombosis of atrium, auricular appendage, and ventricle as current complications following acute myocardial infarction: Secondary | ICD-10-CM

## 2018-06-18 LAB — POCT INR: INR: 2.3 (ref 2.0–3.0)

## 2018-06-18 NOTE — Patient Instructions (Signed)
Continue coumadin 1 1/2 tablets daily  Continue greens.   Recheck in 4 weeks.  

## 2018-07-16 ENCOUNTER — Ambulatory Visit (INDEPENDENT_AMBULATORY_CARE_PROVIDER_SITE_OTHER): Payer: Medicaid Other | Admitting: *Deleted

## 2018-07-16 DIAGNOSIS — I513 Intracardiac thrombosis, not elsewhere classified: Secondary | ICD-10-CM

## 2018-07-16 DIAGNOSIS — Z5181 Encounter for therapeutic drug level monitoring: Secondary | ICD-10-CM

## 2018-07-16 DIAGNOSIS — I6350 Cerebral infarction due to unspecified occlusion or stenosis of unspecified cerebral artery: Secondary | ICD-10-CM

## 2018-07-16 LAB — POCT INR: INR: 1.2 — AB (ref 2.0–3.0)

## 2018-07-16 NOTE — Patient Instructions (Signed)
Take coumadin 2 1/2 tablets tonight and tomorrow night then resume 1 1/2 tablets daily  Continue greens  Recheck in 2 weeks

## 2018-07-30 ENCOUNTER — Ambulatory Visit (INDEPENDENT_AMBULATORY_CARE_PROVIDER_SITE_OTHER): Payer: Medicaid Other | Admitting: *Deleted

## 2018-07-30 DIAGNOSIS — I513 Intracardiac thrombosis, not elsewhere classified: Secondary | ICD-10-CM | POA: Diagnosis not present

## 2018-07-30 DIAGNOSIS — Z5181 Encounter for therapeutic drug level monitoring: Secondary | ICD-10-CM | POA: Diagnosis not present

## 2018-07-30 DIAGNOSIS — I6350 Cerebral infarction due to unspecified occlusion or stenosis of unspecified cerebral artery: Secondary | ICD-10-CM

## 2018-07-30 LAB — POCT INR: INR: 3.8 — AB (ref 2.0–3.0)

## 2018-07-30 NOTE — Patient Instructions (Signed)
Hold coumadin tonight then resume 1 1/2 tablets daily  Continue greens.   Recheck in 3 weeks. 

## 2018-08-20 ENCOUNTER — Ambulatory Visit (INDEPENDENT_AMBULATORY_CARE_PROVIDER_SITE_OTHER): Payer: Medicaid Other | Admitting: *Deleted

## 2018-08-20 DIAGNOSIS — I513 Intracardiac thrombosis, not elsewhere classified: Secondary | ICD-10-CM

## 2018-08-20 DIAGNOSIS — Z5181 Encounter for therapeutic drug level monitoring: Secondary | ICD-10-CM

## 2018-08-20 LAB — POCT INR: INR: 1.3 — AB (ref 2.0–3.0)

## 2018-08-20 NOTE — Patient Instructions (Signed)
Description   Today and tomorrow take 2 tablets, then resume taking 1.5 tablets. Recheck in 2 weeks.  Continue greens. Call us with any medication changes or concerns.

## 2018-08-25 ENCOUNTER — Other Ambulatory Visit: Payer: Self-pay

## 2018-08-25 MED ORDER — LISINOPRIL 10 MG PO TABS: 10.0000 mg | ORAL_TABLET | Freq: Every day | ORAL | 3 refills | Status: DC

## 2018-09-05 ENCOUNTER — Ambulatory Visit (INDEPENDENT_AMBULATORY_CARE_PROVIDER_SITE_OTHER): Payer: Medicaid Other | Admitting: *Deleted

## 2018-09-05 ENCOUNTER — Telehealth: Payer: Self-pay | Admitting: Cardiology

## 2018-09-05 DIAGNOSIS — Z5181 Encounter for therapeutic drug level monitoring: Secondary | ICD-10-CM | POA: Diagnosis not present

## 2018-09-05 DIAGNOSIS — I513 Intracardiac thrombosis, not elsewhere classified: Secondary | ICD-10-CM | POA: Diagnosis not present

## 2018-09-05 LAB — POCT INR: INR: 3.5 — AB (ref 2.0–3.0)

## 2018-09-05 MED ORDER — METOPROLOL TARTRATE 25 MG PO TABS: 25.0000 mg | ORAL_TABLET | Freq: Two times a day (BID) | ORAL | 3 refills | Status: DC

## 2018-09-05 NOTE — Telephone Encounter (Signed)
Needs refill on Toprol sent to [facility]

## 2018-09-05 NOTE — Telephone Encounter (Signed)
Done

## 2018-09-05 NOTE — Patient Instructions (Signed)
Hold coumadin tonight then resume 1 1/2 tablets daily. Recheck in 3 weeks.  Continue greens. Call us with any medication changes or concerns.

## 2018-09-23 ENCOUNTER — Ambulatory Visit: Payer: Medicaid Other | Admitting: Cardiology

## 2018-09-24 ENCOUNTER — Encounter: Payer: Self-pay | Admitting: Cardiology

## 2018-09-26 ENCOUNTER — Ambulatory Visit (INDEPENDENT_AMBULATORY_CARE_PROVIDER_SITE_OTHER): Payer: Medicaid Other | Admitting: *Deleted

## 2018-09-26 DIAGNOSIS — Z5181 Encounter for therapeutic drug level monitoring: Secondary | ICD-10-CM

## 2018-09-26 DIAGNOSIS — I6350 Cerebral infarction due to unspecified occlusion or stenosis of unspecified cerebral artery: Secondary | ICD-10-CM | POA: Diagnosis not present

## 2018-09-26 DIAGNOSIS — I513 Intracardiac thrombosis, not elsewhere classified: Secondary | ICD-10-CM

## 2018-09-26 DIAGNOSIS — I240 Acute coronary thrombosis not resulting in myocardial infarction: Secondary | ICD-10-CM

## 2018-09-26 LAB — POCT INR: INR: 3.5 — AB (ref 2.0–3.0)

## 2018-09-26 NOTE — Patient Instructions (Signed)
Hold coumadin tonight then decrease dose to 1 1/2 tablets daily except 1 tablet on Mondays and Thursdays. Recheck in 3 weeks.  Continue greens. Call us with any medication changes or concerns.

## 2018-10-08 ENCOUNTER — Ambulatory Visit (INDEPENDENT_AMBULATORY_CARE_PROVIDER_SITE_OTHER): Payer: Medicaid Other | Admitting: *Deleted

## 2018-10-08 DIAGNOSIS — I513 Intracardiac thrombosis, not elsewhere classified: Secondary | ICD-10-CM | POA: Diagnosis not present

## 2018-10-08 DIAGNOSIS — Z5181 Encounter for therapeutic drug level monitoring: Secondary | ICD-10-CM

## 2018-10-08 DIAGNOSIS — I240 Acute coronary thrombosis not resulting in myocardial infarction: Secondary | ICD-10-CM

## 2018-10-08 LAB — POCT INR: INR: 4.2 — AB (ref 2.0–3.0)

## 2018-10-08 NOTE — Patient Instructions (Signed)
Hold coumadin tonight then decrease dose to 1 tablet daily except 1 1/2 tablets on Mondays, Wednesdays and Fridays. Recheck in 3 weeks.  Continue greens. Call us with any medication changes or concerns.

## 2018-10-27 ENCOUNTER — Ambulatory Visit (INDEPENDENT_AMBULATORY_CARE_PROVIDER_SITE_OTHER): Payer: Medicaid Other | Admitting: *Deleted

## 2018-10-27 DIAGNOSIS — I240 Acute coronary thrombosis not resulting in myocardial infarction: Secondary | ICD-10-CM

## 2018-10-27 DIAGNOSIS — I513 Intracardiac thrombosis, not elsewhere classified: Secondary | ICD-10-CM

## 2018-10-27 DIAGNOSIS — Z5181 Encounter for therapeutic drug level monitoring: Secondary | ICD-10-CM

## 2018-10-27 LAB — POCT INR: INR: 1.6 — AB (ref 2.0–3.0)

## 2018-10-27 NOTE — Patient Instructions (Signed)
Take coumadin 2 tablets tonight then resume 1 tablet daily except 1 1/2 tablets on Mondays, Wednesdays and Fridays. Recheck in 2 weeks.  Continue greens. Call us with any medication changes or concerns.

## 2018-11-10 NOTE — Progress Notes (Signed)
Cardiology Office Note  Date: 11/11/2018   ID: JOHNROBERT FOTI, DOB November 15, 1966, MRN [id number]  PCP: Oval Linsey, MD  Primary Cardiologist: [name], MD   Chief Complaint  Patient presents with  . Coronary Artery Disease    History of Present Illness: PER BEAGLEY is a 52 y.o. male last seen in February.  He presents today for a routine visit.  From a cardiac perspective, he does not report any angina symptoms or nitroglycerin use.  He has stable NYHA class II dyspnea, no palpitations or syncope.  He remains on Coumadin with follow-up in the anticoagulation clinic.  He does not report any spontaneous bleeding episodes or changes in stool.  We have kept him on low-dose aspirin as well with history of LV mural thrombus and stroke.  Follow-up lab work in March revealed LDL 158 and it was recommended that he increase his Zocor to 40 mg daily.  He is due for follow-up lab work.  We discussed this today.  He is also complaining of progressive right hip pain with prior history of replacement.  He has not seen his orthopedist in a year.  Past Medical History:  Diagnosis Date  . CAD (coronary artery disease)    Multivessel, LVEF 45-50%  . Cardiomyopathy (HCC)    LVEF 50-55% December 2016  . CKD (chronic kidney disease) stage 2, GFR 60-89 ml/min   . COPD (chronic obstructive pulmonary disease) (HCC)   . Depression   . Essential hypertension   . Falls   . Gallstone   . Gastroenteritis   . History of stroke 2004  . Hyperlipidemia   . Left ventricular mural thrombus    On Coumadin    Past Surgical History:  Procedure Laterality Date  . CORONARY ARTERY BYPASS GRAFT     DOR anterior ventricular restoration surgery 8/06, [facility] [place] to first diagonal, SVG to PLB, SVG to RVE branch of nondominant RCA  . EYE SURGERY    . TOTAL HIP ARTHROPLASTY Left 06/08/2016   Procedure: TOTAL HIP ARTHROPLASTY ANTERIOR APPROACH;  Surgeon: Kathryne Hitch, MD;  Location:  [facility];  Service: Orthopedics;  Laterality: Left;  . TOTAL HIP ARTHROPLASTY Right 07/20/2016   Procedure: RIGHT TOTAL HIP ARTHROPLASTY ANTERIOR APPROACH;  Surgeon: Kathryne Hitch, MD;  Location: [facility];  Service: Orthopedics;  Laterality: Right;    Current Outpatient Medications  Medication Sig Dispense Refill  . aspirin EC 81 MG tablet Take 81 mg by mouth daily.    . ciprofloxacin (CIPRO) 500 MG tablet Take 500 mg by mouth 2 (two) times daily.    Marland Kitchen HYDROcodone-acetaminophen (NORCO) 5-325 MG tablet Take 2 tablets by mouth every 4 (four) hours as needed. 20 tablet 0  . lisinopril (PRINIVIL,ZESTRIL) 10 MG tablet Take 1 tablet (10 mg total) by mouth daily. 90 tablet 3  . metoprolol tartrate (LOPRESSOR) 25 MG tablet Take 1 tablet (25 mg total) by mouth 2 (two) times daily. 60 tablet 3  . nitroGLYCERIN (NITROSTAT) 0.4 MG SL tablet Place 1 tablet (0.4 mg total) under the tongue every 5 (five) minutes as needed for chest pain. 25 tablet 3  . simvastatin (ZOCOR) 40 MG tablet Take 1 tablet (40 mg total) by mouth daily. 90 tablet 3  . tamsulosin (FLOMAX) 0.4 MG CAPS capsule 1 q HS to aid stone passage 7 capsule 0  . tiZANidine (ZANAFLEX) 4 MG tablet Take 1 tablet (4 mg total) by mouth every 8 (eight) hours as needed for muscle  spasms. 90 tablet 0  . warfarin (COUMADIN) 5 MG tablet TAKE 1 & 1/2 (ONE & ONE-HALF) TABLETS BY MOUTH ONCE DAILY EXCEPT  ON  SUNDAYS  AND  THURSDAYS  TAKE  1  TABLET 40 tablet 3  . warfarin (COUMADIN) 5 MG tablet Take 1 1/2 tablets daily 45 tablet 3   No current facility-administered medications for this visit.    Allergies:  Benadryl [diphenhydramine] and Chantix [varenicline]   Social History: The patient  reports that he has been smoking cigarettes. He has a 7.50 pack-year smoking history. He has never used smokeless tobacco. He reports that he has current or past drug history. Drug: Marijuana. He reports that he does not drink alcohol.   ROS:  Please see the history  of present illness. Otherwise, complete review of systems is positive for recent headache.  All other systems are reviewed and negative.   Physical Exam: VS:  BP (!) 152/88 (BP Location: Left Arm)   Pulse 62   Ht 5\' 8"  (1.727 m)   Wt 171 lb (77.6 kg)   SpO2 95%   BMI 26.00 kg/m , BMI Body mass index is 26 kg/m.  Wt Readings from Last 3 Encounters:  11/11/18 171 lb (77.6 kg)  02/26/18 168 lb (76.2 kg)  01/31/17 160 lb (72.6 kg)    General: Patient appears comfortable at rest. HEENT: Conjunctiva and lids normal, oropharynx clear with poor dentition. Neck: Supple, no elevated JVP or carotid bruits, no thyromegaly. Lungs: Clear to auscultation, nonlabored breathing at rest. Cardiac: Regular rate and rhythm, no S3 or significant systolic murmur. Abdomen: Soft, nontender, bowel sounds present. Extremities: No pitting edema, distal pulses 2+. Skin: Warm and dry. Musculoskeletal: No kyphosis. Neuropsychiatric: Alert and oriented x3, affect grossly appropriate.  Nonfocal and no nystagmus.  ECG: I personally reviewed the tracing from 02/26/2018 which showed sinus rhythm with previous anterolateral infarct pattern and residual persistent ST segment abnormalities.  Recent Labwork: 02/28/2018: ALT 29; AST 19     Component Value Date/Time   CHOL 238 (H) 02/28/2018 0741   TRIG 231 (H) 02/28/2018 0741   HDL 41 02/28/2018 0741   CHOLHDL 5.8 (H) 02/28/2018 0741   VLDL 20 03/28/2011 0829   LDLCALC 158 (H) 02/28/2018 0741    Other Studies Reviewed Today:  Echocardiogram 12/28/2015: Study Conclusions  - Left ventricle: The cavity size was normal. There was mild   concentric hypertrophy. Systolic function was normal. The   estimated ejection fraction was in the range of 50% to 55%.   Features are consistent with a pseudonormal left ventricular   filling pattern, with concomitant abnormal relaxation and   increased filling pressure (grade 2 diastolic dysfunction).   Doppler parameters are  consistent with high ventricular filling   pressure. - Regional wall motion abnormality: Akinesis of the mid   anteroseptal myocardium; mild hypokinesis of the apical septal   myocardium; probable mild hypokinesis of the apical myocardium. - Aortic valve: Mildly to moderately calcified annulus. - Mitral valve: Calcified annulus. Mildly thickened leaflets . - Right ventricle: Systolic function was mildly reduced.  Impressions:  - While the apical endocardium was suboptimally visualized, there   was no obvious thrombus seen.  Assessment and Plan:  1.  CAD with history of CABG.  He does not report any active angina at this time.  He remains on low-dose aspirin as well as statin therapy.  2.  Ischemic cardiomyopathy with history of LV mural thrombus.  Last echocardiogram showed LVEF 50 to 55%.  At that time he did not have definitive LV thrombus visualized, however persistent wall motion abnormalities increasing likelihood of formation.  We have continued Coumadin with history of previous stroke.  3.  Mixed hyperlipidemia, Zocor increased after last visit.  Plan to follow-up FLP and LFTs.  4.  Essential hypertension, continue with present medications and keep follow-up with PCP.  5.  Tobacco abuse.  We have discussed smoking cessation but he has not been motivated to quit.  Current medicines were reviewed with the patient today.   Orders Placed This Encounter  Procedures  . Lipid Profile  . Hepatic function panel    Disposition: Follow-up in 6 months.  Signed, Jonelle Sidle, MD, [facility] 11/11/2018 9:20 AM     [facility] [address], [place], [place] [address] Phone: [phone]; Fax: [phone]

## 2018-11-11 ENCOUNTER — Encounter: Payer: Self-pay | Admitting: Cardiology

## 2018-11-11 ENCOUNTER — Emergency Department (HOSPITAL_COMMUNITY): Payer: Medicaid Other

## 2018-11-11 ENCOUNTER — Emergency Department (HOSPITAL_COMMUNITY)
Admission: EM | Admit: 2018-11-11 | Discharge: 2018-11-11 | Disposition: A | Discharge status: Home / Self Care | Payer: Medicaid Other | Attending: Emergency Medicine | Admitting: Emergency Medicine

## 2018-11-11 ENCOUNTER — Other Ambulatory Visit (HOSPITAL_COMMUNITY)
Admission: RE | Admit: 2018-11-11 | Discharge: 2018-11-11 | Disposition: A | Discharge status: Home / Self Care | Payer: Medicaid Other | Source: Ambulatory Visit | Attending: Cardiology | Admitting: Cardiology

## 2018-11-11 ENCOUNTER — Other Ambulatory Visit: Payer: Self-pay

## 2018-11-11 ENCOUNTER — Ambulatory Visit (INDEPENDENT_AMBULATORY_CARE_PROVIDER_SITE_OTHER): Payer: Medicaid Other | Admitting: Cardiology

## 2018-11-11 ENCOUNTER — Encounter (HOSPITAL_COMMUNITY): Payer: Self-pay | Admitting: Emergency Medicine

## 2018-11-11 VITALS — BP 152/88 | HR 62 | Ht 68.0 in | Wt 171.0 lb

## 2018-11-11 DIAGNOSIS — F17210 Nicotine dependence, cigarettes, uncomplicated: Secondary | ICD-10-CM | POA: Insufficient documentation

## 2018-11-11 DIAGNOSIS — F329 Major depressive disorder, single episode, unspecified: Secondary | ICD-10-CM | POA: Insufficient documentation

## 2018-11-11 DIAGNOSIS — Z7901 Long term (current) use of anticoagulants: Secondary | ICD-10-CM | POA: Diagnosis not present

## 2018-11-11 DIAGNOSIS — I10 Essential (primary) hypertension: Secondary | ICD-10-CM

## 2018-11-11 DIAGNOSIS — Z720 Tobacco use: Secondary | ICD-10-CM

## 2018-11-11 DIAGNOSIS — I129 Hypertensive chronic kidney disease with stage 1 through stage 4 chronic kidney disease, or unspecified chronic kidney disease: Secondary | ICD-10-CM | POA: Insufficient documentation

## 2018-11-11 DIAGNOSIS — Z96643 Presence of artificial hip joint, bilateral: Secondary | ICD-10-CM | POA: Diagnosis not present

## 2018-11-11 DIAGNOSIS — J449 Chronic obstructive pulmonary disease, unspecified: Secondary | ICD-10-CM | POA: Diagnosis not present

## 2018-11-11 DIAGNOSIS — Z8673 Personal history of transient ischemic attack (TIA), and cerebral infarction without residual deficits: Secondary | ICD-10-CM | POA: Diagnosis not present

## 2018-11-11 DIAGNOSIS — Z951 Presence of aortocoronary bypass graft: Secondary | ICD-10-CM | POA: Diagnosis not present

## 2018-11-11 DIAGNOSIS — Z79899 Other long term (current) drug therapy: Secondary | ICD-10-CM | POA: Diagnosis not present

## 2018-11-11 DIAGNOSIS — I2510 Atherosclerotic heart disease of native coronary artery without angina pectoris: Secondary | ICD-10-CM | POA: Diagnosis not present

## 2018-11-11 DIAGNOSIS — Z7982 Long term (current) use of aspirin: Secondary | ICD-10-CM | POA: Insufficient documentation

## 2018-11-11 DIAGNOSIS — N182 Chronic kidney disease, stage 2 (mild): Secondary | ICD-10-CM | POA: Diagnosis not present

## 2018-11-11 DIAGNOSIS — I255 Ischemic cardiomyopathy: Secondary | ICD-10-CM | POA: Diagnosis not present

## 2018-11-11 DIAGNOSIS — R51 Headache: Secondary | ICD-10-CM | POA: Diagnosis not present

## 2018-11-11 DIAGNOSIS — E782 Mixed hyperlipidemia: Secondary | ICD-10-CM | POA: Diagnosis not present

## 2018-11-11 DIAGNOSIS — R519 Headache, unspecified: Secondary | ICD-10-CM

## 2018-11-11 LAB — CBC WITH DIFFERENTIAL/PLATELET
Abs Immature Granulocytes: 0.02 10*3/uL (ref 0.00–0.07)
BASOS PCT: 1 %
Basophils Absolute: 0 10*3/uL (ref 0.0–0.1)
EOS ABS: 0.2 10*3/uL (ref 0.0–0.5)
Eosinophils Relative: 3 %
HCT: 51.7 % (ref 39.0–52.0)
Hemoglobin: 16.7 g/dL (ref 13.0–17.0)
Immature Granulocytes: 0 %
Lymphocytes Relative: 40 %
Lymphs Abs: 2.9 10*3/uL (ref 0.7–4.0)
MCH: 30.1 pg (ref 26.0–34.0)
MCHC: 32.3 g/dL (ref 30.0–36.0)
MCV: 93.3 fL (ref 80.0–100.0)
MONO ABS: 0.6 10*3/uL (ref 0.1–1.0)
Monocytes Relative: 8 %
Neutro Abs: 3.5 10*3/uL (ref 1.7–7.7)
Neutrophils Relative %: 48 %
PLATELETS: 247 10*3/uL (ref 150–400)
RBC: 5.54 MIL/uL (ref 4.22–5.81)
RDW: 13.4 % (ref 11.5–15.5)
WBC: 7.2 10*3/uL (ref 4.0–10.5)
nRBC: 0 % (ref 0.0–0.2)

## 2018-11-11 LAB — LIPID PANEL
CHOL/HDL RATIO: 4.3 ratio
CHOLESTEROL: 166 mg/dL (ref 0–200)
HDL: 39 mg/dL — ABNORMAL LOW (ref >40)
LDL Cholesterol: 96 mg/dL (ref 0–99)
Triglycerides: 153 mg/dL — ABNORMAL HIGH (ref <150)
VLDL: 31 mg/dL (ref 0–40)

## 2018-11-11 LAB — HEPATIC FUNCTION PANEL
ALBUMIN: 4.2 g/dL (ref 3.5–5.0)
ALK PHOS: 86 U/L (ref 38–126)
ALT: 37 U/L (ref 0–44)
AST: 28 U/L (ref 15–41)
Bilirubin, Direct: 0.1 mg/dL (ref 0.0–0.2)
Indirect Bilirubin: 0.6 mg/dL (ref 0.3–0.9)
TOTAL PROTEIN: 7.8 g/dL (ref 6.5–8.1)
Total Bilirubin: 0.7 mg/dL (ref 0.3–1.2)

## 2018-11-11 LAB — COMPREHENSIVE METABOLIC PANEL
ALK PHOS: 91 U/L (ref 38–126)
ALT: 39 U/L (ref 0–44)
AST: 27 U/L (ref 15–41)
Albumin: 4.4 g/dL (ref 3.5–5.0)
Anion gap: 8 (ref 5–15)
BUN: 15 mg/dL (ref 6–20)
CALCIUM: 9.8 mg/dL (ref 8.9–10.3)
CO2: 24 mmol/L (ref 22–32)
CREATININE: 1.17 mg/dL (ref 0.61–1.24)
Chloride: 107 mmol/L (ref 98–111)
Glucose, Bld: 101 mg/dL — ABNORMAL HIGH (ref 70–99)
Potassium: 4 mmol/L (ref 3.5–5.1)
Sodium: 139 mmol/L (ref 135–145)
TOTAL PROTEIN: 8 g/dL (ref 6.5–8.1)
Total Bilirubin: 0.6 mg/dL (ref 0.3–1.2)

## 2018-11-11 LAB — PROTIME-INR
INR: 1.98
PROTHROMBIN TIME: 22.3 s — AB (ref 11.4–15.2)

## 2018-11-11 MED ORDER — KETOROLAC TROMETHAMINE 30 MG/ML IJ SOLN
15.0000 mg | Freq: Once | INTRAMUSCULAR | Status: AC
  Administered 2018-11-11: 15 mg via INTRAVENOUS
  Filled 2018-11-11: qty 1

## 2018-11-11 MED ORDER — PROMETHAZINE HCL 25 MG/ML IJ SOLN
12.5000 mg | Freq: Once | INTRAMUSCULAR | Status: AC
  Administered 2018-11-11: 12.5 mg via INTRAVENOUS
  Filled 2018-11-11: qty 1

## 2018-11-11 NOTE — ED Provider Notes (Signed)
[facility] EMERGENCY DEPARTMENT Provider Note   CSN: [id number] Arrival date & time: 11/11/18  1823     History   Chief Complaint Chief Complaint  Patient presents with  . Headache    HPI LEVII HAIRFIELD is a 52 y.o. male.  HPI Patient presents with headache.  He has had for the last 5 days.  Dull.  States this has been constant.  Began somewhat gradually.  States it began like a headache.  It is over most of his head.  States he has not been able to sleep.  He is on Coumadin for his heart.  Has not really had headaches like this before.  Has taken over-the-counter medications without relief.  No nausea or vomiting.  No photophobia.  No fevers or neck pain.  No chest pain or trouble breathing.  Patient states he saw Dr. Diona Browner today who told him to come to the ER to get a CAT scan. Past Medical History:  Diagnosis Date  . CAD (coronary artery disease)    Multivessel, LVEF 45-50%  . Cardiomyopathy (HCC)    LVEF 50-55% December 2016  . CKD (chronic kidney disease) stage 2, GFR 60-89 ml/min   . COPD (chronic obstructive pulmonary disease) (HCC)   . Depression   . Essential hypertension   . Falls   . Gallstone   . Gastroenteritis   . History of stroke 2004  . Hyperlipidemia   . Left ventricular mural thrombus    On Coumadin    Patient Active Problem List   Diagnosis Date Noted  . Avascular necrosis of bone of right hip (HCC) 07/20/2016  . 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 Avascular necrosis of bones of both hips (HCC) 06/08/2016  . Status post total replacement of left hip 06/08/2016  . AKI (acute kidney injury) (HCC) 05/15/2016  . Nausea and vomiting 05/15/2016  . Abdominal pain 05/15/2016  . Chest pain 05/15/2016  . Emesis   . Encounter for therapeutic drug monitoring 03/03/2014  . Long term (current) use of anticoagulants 04/16/2011  . GAIT IMBALANCE 02/02/2011  . ABNORMAL CV (STRESS) TEST 10/02/2010  . Mural thrombus of left ventricle  (HCC) 09/08/2010  . OTHER SPEC FORMS CHRONIC ISCHEMIC HEART DISEASE 06/07/2010  . Mixed hyperlipidemia 03/02/2010  . TOBACCO ABUSE 03/02/2010  . Essential hypertension, benign 03/02/2010  . CORONARY ATHEROSCLEROSIS NATIVE CORONARY ARTERY 03/02/2010  . CVA 03/01/2010    Past Surgical History:  Procedure Laterality Date  . CORONARY ARTERY BYPASS GRAFT     DOR anterior ventricular restoration surgery 8/06, [facility] [place] to first diagonal, SVG to PLB, SVG to RVE branch of nondominant RCA  . EYE SURGERY    . TOTAL HIP ARTHROPLASTY Left 06/08/2016   Procedure: TOTAL HIP ARTHROPLASTY ANTERIOR APPROACH;  Surgeon: Kathryne Hitch, MD;  Location: [facility];  Service: Orthopedics;  Laterality: Left;  . TOTAL HIP ARTHROPLASTY Right 07/20/2016   Procedure: RIGHT TOTAL HIP ARTHROPLASTY ANTERIOR APPROACH;  Surgeon: Kathryne Hitch, MD;  Location: [facility];  Service: Orthopedics;  Laterality: Right;        Home Medications    Prior to Admission medications   Medication Sig Start Date End Date Taking? Authorizing Provider  aspirin EC 81 MG tablet Take 81 mg by mouth daily.    [provider]  ciprofloxacin (CIPRO) 500 MG tablet Take 500 mg by mouth 2 (two) times daily.    [provider]  HYDROcodone-acetaminophen (NORCO) 5-325 MG tablet Take  2 tablets by mouth every 4 (four) hours as needed. 01/31/17   Mancel Bale, MD  lisinopril (PRINIVIL,ZESTRIL) 10 MG tablet Take 1 tablet (10 mg total) by mouth daily. 08/25/18   Jonelle Sidle, MD  metoprolol tartrate (LOPRESSOR) 25 MG tablet Take 1 tablet (25 mg total) by mouth 2 (two) times daily. 09/05/18   Jonelle Sidle, MD  nitroGLYCERIN (NITROSTAT) 0.4 MG SL tablet Place 1 tablet (0.4 mg total) under the tongue every 5 (five) minutes as needed for chest pain. 03/20/18   Jonelle Sidle, MD  simvastatin (ZOCOR) 40 MG tablet Take 1 tablet (40 mg total) by mouth daily. 04/08/18   Jonelle Sidle, MD  tamsulosin  [facility]) 0.4 MG CAPS capsule 1 q HS to aid stone passage 01/31/17   Mancel Bale, MD  tiZANidine (ZANAFLEX) 4 MG tablet Take 1 tablet (4 mg total) by mouth every 8 (eight) hours as needed for muscle spasms. 01/02/17   Kathryne Hitch, MD  warfarin (COUMADIN) 5 MG tablet TAKE 1 & 1/2 (ONE & ONE-HALF) TABLETS BY MOUTH ONCE DAILY EXCEPT  ON  SUNDAYS  AND  THURSDAYS  TAKE  1  TABLET 06/03/18   Jonelle Sidle, MD  warfarin (COUMADIN) 5 MG tablet Take 1 1/2 tablets daily 06/03/18   Jonelle Sidle, MD    Family History Family History  Problem Relation Age of Onset  . Hypertension Mother   . Diabetes Mother     Social History Social History   Tobacco Use  . Smoking status: Current Every Day Smoker    Packs/day: 0.25    Years: 30.00    Pack years: 7.50    Types: Cigarettes  . Smokeless tobacco: Never Used  Substance Use Topics  . Alcohol use: No    Alcohol/week: 0.0 standard drinks    Comment: hx of alcohol use   . Drug use: Yes    Types: Marijuana    Comment:  marijuana use     Allergies   Benadryl [diphenhydramine] and Chantix [varenicline]   Review of Systems Review of Systems  Constitutional: Positive for appetite change.  HENT: Negative for congestion.   Respiratory: Negative for shortness of breath.   Cardiovascular: Negative for chest pain.  Genitourinary: Negative for flank pain.  Musculoskeletal: Negative for back pain.  Skin: Negative for rash.  Neurological: Positive for headaches. Negative for light-headedness.  Hematological: Negative for adenopathy.  Psychiatric/Behavioral: Negative for confusion.     Physical Exam Updated Vital Signs BP (!) 190/112 (BP Location: Right Arm)   Pulse 69   Temp 97.7 F (36.5 C) (Oral)   Resp 16   Ht 5\' 7"  (1.702 m)   Wt 77.6 kg   SpO2 95%   BMI 26.78 kg/m   Physical Exam  Constitutional: He appears well-developed.  HENT:  Head: Atraumatic.  Neck: Neck supple.  Cardiovascular: Normal rate.    Pulmonary/Chest: Effort normal.  Abdominal: Soft.  Musculoskeletal: He exhibits no edema.  Neurological: He is alert.  Skin: Skin is warm.  Psychiatric: He has a normal mood and affect.     ED Treatments / Results  Labs (all labs ordered are listed, but only abnormal results are displayed) Labs Reviewed  COMPREHENSIVE METABOLIC PANEL - Abnormal; Notable for the following components:      Result Value   Glucose, Bld 101 (*)    All other components within normal limits  PROTIME-INR - Abnormal; Notable for the following components:   Prothrombin Time 22.3 (*)  All other components within normal limits  CBC WITH DIFFERENTIAL/PLATELET    EKG None  Radiology Ct Head Wo Contrast  Result Date: 11/11/2018 CLINICAL DATA:  Headache x4 days. EXAM: CT HEAD WITHOUT CONTRAST TECHNIQUE: Contiguous axial images were obtained from the base of the skull through the vertex without intravenous contrast. COMPARISON:  None. FINDINGS: Brain: Remote right frontal lobe infarct with encephalomalacia. Chronic mild microvascular ischemic disease of periventricular white matter is redemonstrated. No hydrocephalus. Midline fourth ventricle without effacement. Midline patent basal cisterns. Brainstem and cerebellum are nonacute. Vascular: No hyperdense vessel or unexpected calcification. Skull: Normal. Negative for fracture or focal lesion. Sinuses/Orbits: No acute finding. Other: None. IMPRESSION: Remote right frontal lobe infarct with encephalomalacia. No acute intracranial abnormality. Electronically Signed   By: Tollie Eth M.D.   On: 11/11/2018 19:57    Procedures Procedures (including critical care time)  Medications Ordered in ED Medications  promethazine (PHENERGAN) injection 12.5 mg (12.5 mg Intravenous Given 11/11/18 2025)  ketorolac (TORADOL) 30 MG/ML injection 15 mg (15 mg Intravenous Given 11/11/18 2025)     Initial Impression / Assessment and Plan / ED Course  I have reviewed the triage  vital signs and the nursing notes.  Pertinent labs & imaging results that were available during my care of the patient were reviewed by me and considered in my medical decision making (see chart for details).     Patient with headache.  On anticoagulation.  Head CT reassuring.  Feels better after treatment.  Has some hypertension but reportedly this is chronic for him.  Will discharge home.  Final Clinical Impressions(s) / ED Diagnoses   Final diagnoses:  Acute nonintractable headache, unspecified headache type    ED Discharge Orders    None       Benjiman Core, MD 11/11/18 2348

## 2018-11-11 NOTE — ED Notes (Signed)
Pt standing in the room, looking at cell phone, states this is not the worse headache, but has lasted the longest, Onset Friday.

## 2018-11-11 NOTE — ED Triage Notes (Signed)
Pt c/o headache x 4 days. Denies any n/v/d/dizziness or vision changes with headache. Multiple OTC meds with no relief at all per pt. Denies any numbness or tingling or body. Pt states he would say this is the worst headache of his life.

## 2018-11-11 NOTE — Patient Instructions (Signed)
Medication Instructions:  Your physician recommends that you continue on your current medications as directed. Please refer to the Current Medication list given to you today.  If you need a refill on your cardiac medications before your next appointment, please call your pharmacy.   Lab work: FASTING lipids, LFT's If you have labs (blood work) drawn today and your tests are completely normal, you will receive your results only by: Marland Kitchen MyChart Message (if you have MyChart) OR . A paper copy in the mail If you have any lab test that is abnormal or we need to change your treatment, we will call you to review the results.  Testing/Procedures: None  Follow-Up: At [facility], you and your health needs are our priority.  As part of our continuing mission to provide you with exceptional heart care, we have created designated Provider Care Teams.  These Care Teams include your primary Cardiologist (physician) and Advanced Practice Providers (APPs -  Physician Assistants and Nurse Practitioners) who all work together to provide you with the care you need, when you need it. You will need a follow up appointment in 6 months.  Please call our office 2 months in advance to schedule this appointment.  You may see [name], MD or one of the following Advanced Practice Providers on your designated Care Team:   Randall An, PA-C [facility]) . Jacolyn Reedy, PA-C [facility] Office)  Any Other Special Instructions Will Be Listed Below (If Applicable).  If headache continues, please see your primary care doctor

## 2018-11-17 ENCOUNTER — Ambulatory Visit (INDEPENDENT_AMBULATORY_CARE_PROVIDER_SITE_OTHER): Payer: Medicaid Other | Admitting: *Deleted

## 2018-11-17 DIAGNOSIS — Z5181 Encounter for therapeutic drug level monitoring: Secondary | ICD-10-CM | POA: Diagnosis not present

## 2018-11-17 DIAGNOSIS — I513 Intracardiac thrombosis, not elsewhere classified: Secondary | ICD-10-CM

## 2018-11-17 LAB — POCT INR: INR: 2.8 (ref 2.0–3.0)

## 2018-11-17 NOTE — Patient Instructions (Signed)
Continue coumadin 1 tablet daily except 1 1/2 tablets on Mondays, Wednesdays and Fridays. Recheck in 4 weeks.  Continue greens. Call us with any medication changes or concerns.

## 2018-11-19 IMAGING — CT CT HEAD W/O CM
3 series · 15 of 47 positions shown, 18 images · non-contrast
Comparison: None.

CLINICAL DATA: Headache x4 days.

EXAM:
CT HEAD WITHOUT CONTRAST
TECHNIQUE: Contiguous axial images were obtained from the base of the skull
through the vertex without intravenous contrast.

[Series 2: head trauma wo · axial · 0.48mm/px · z∈[+1534,+1664]mm · 9 of 32 slices shown, 12 images]
[im 3/32  brain]
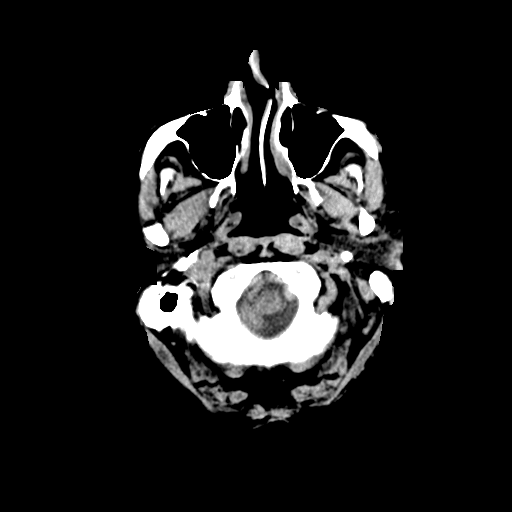
[im 3/32  bone]
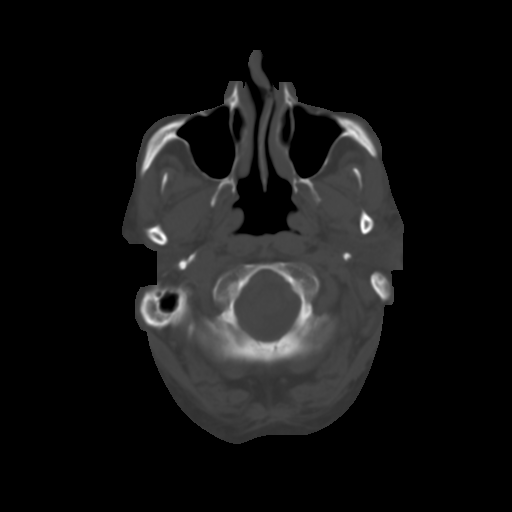
[im 6/32  brain]
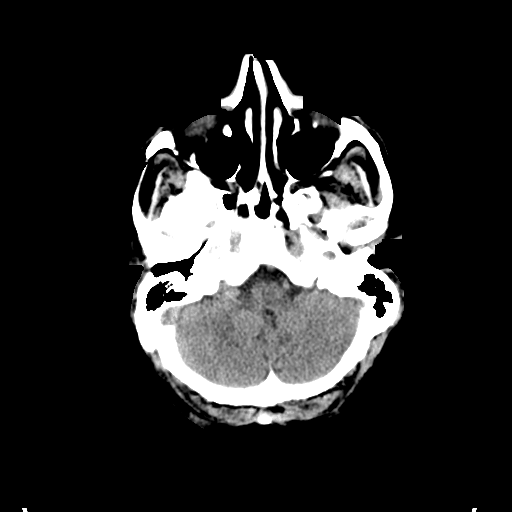
[im 9/32  brain]
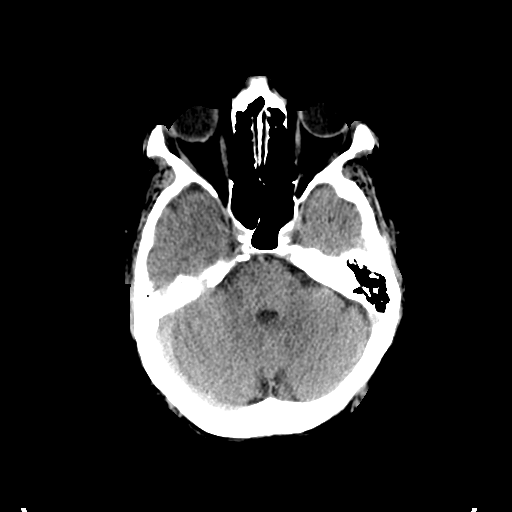
[im 12/32  brain]
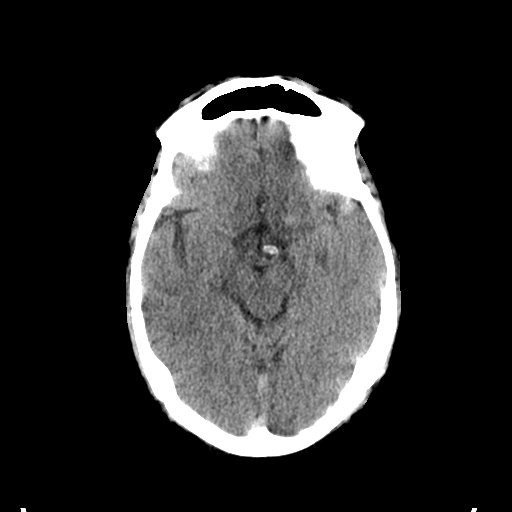
[im 17/32  brain]
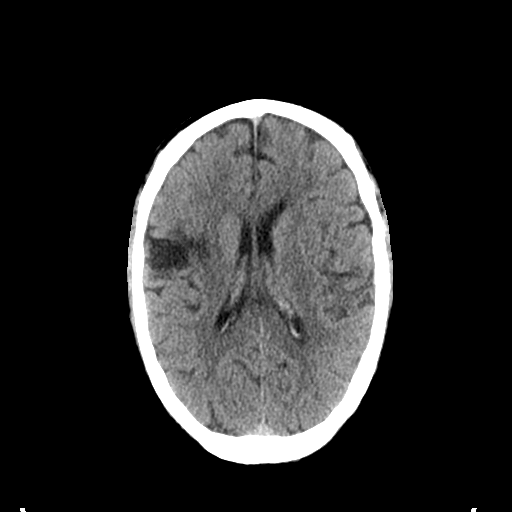
[im 17/32  bone]
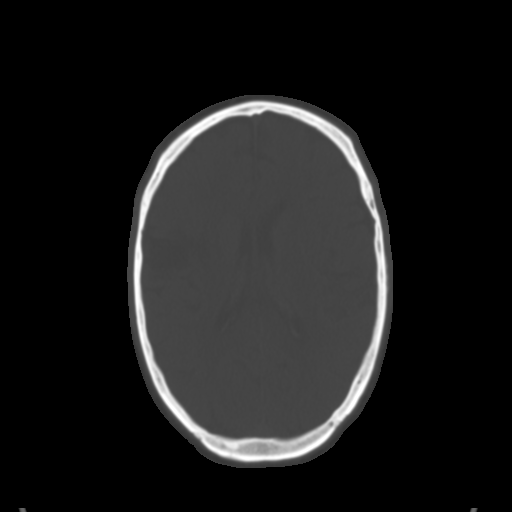
[im 20/32  brain]
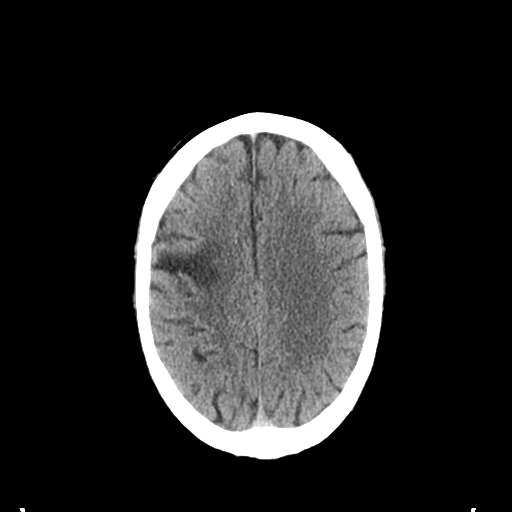
[im 23/32  brain]
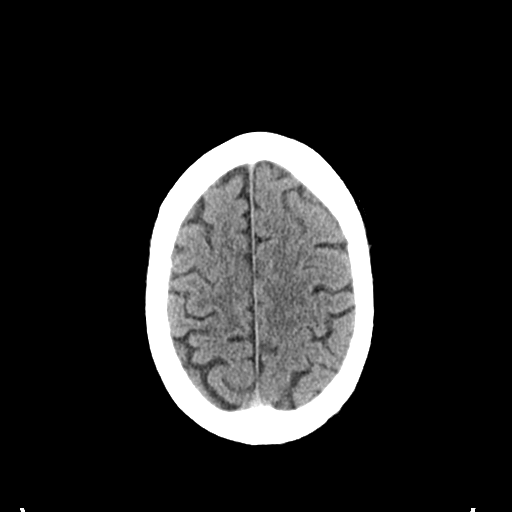
[im 26/32  brain]
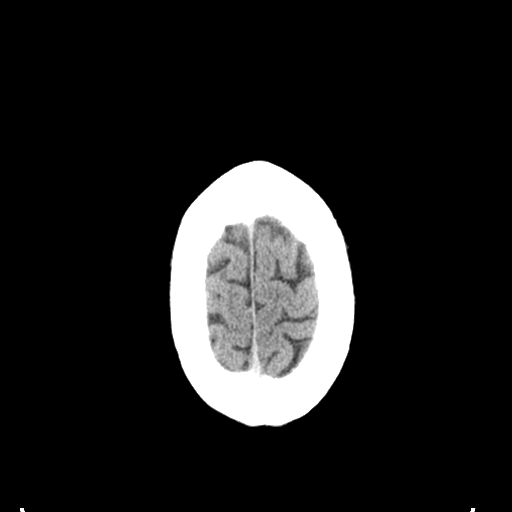
[im 29/32  brain]
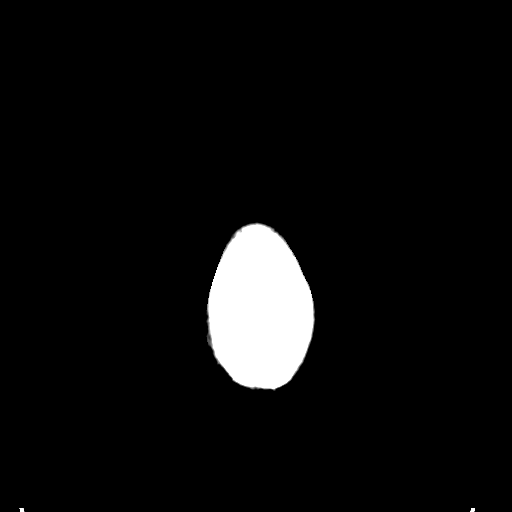
[im 29/32  bone]
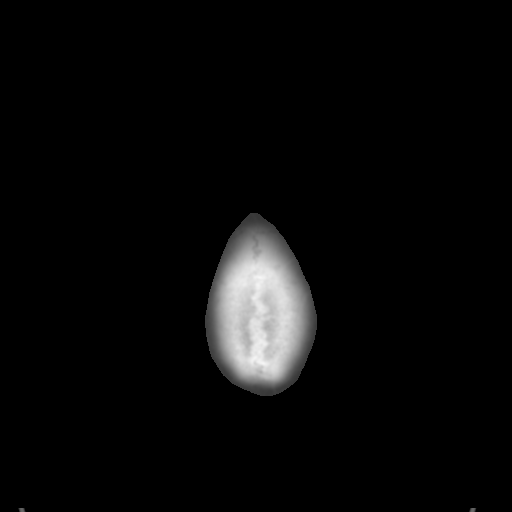

[Series 4: coronal soft tissue · coronal · 0.31mm/px · 3 of 71 slices shown]
[im 24/71  brain]
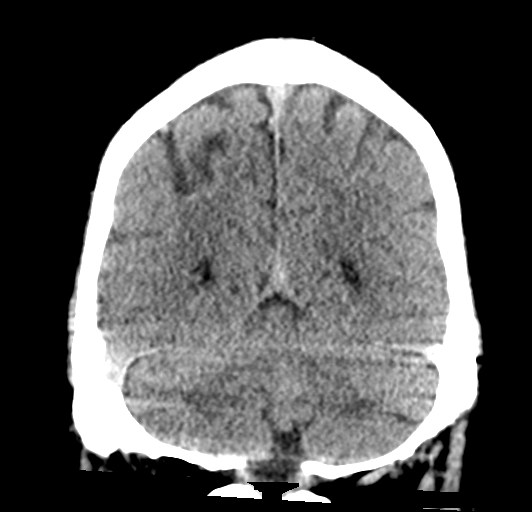
[im 32/71  brain]
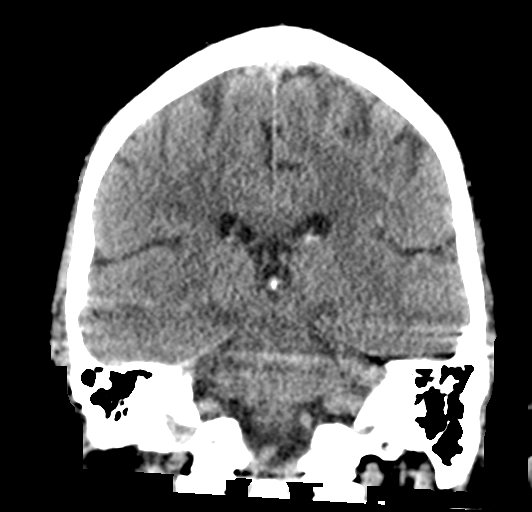
[im 39/71  brain]
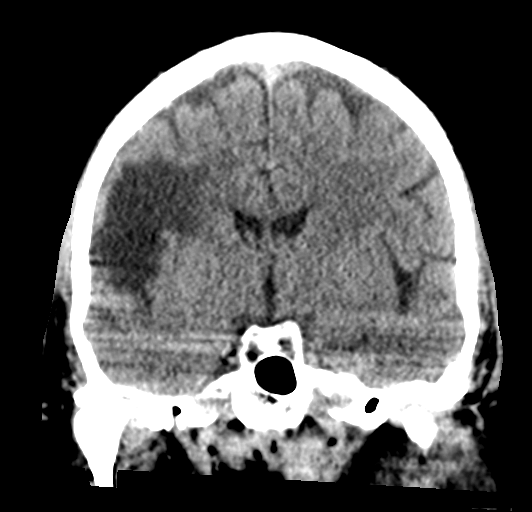

[Series 5: sagittal soft tissue · sagittal · 0.30mm/px · 3 of 55 slices shown]
[im 19/55  brain]
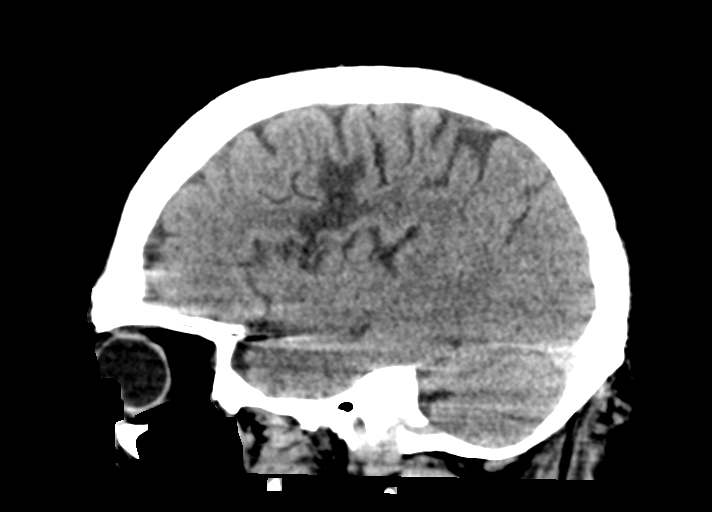
[im 28/55  brain]
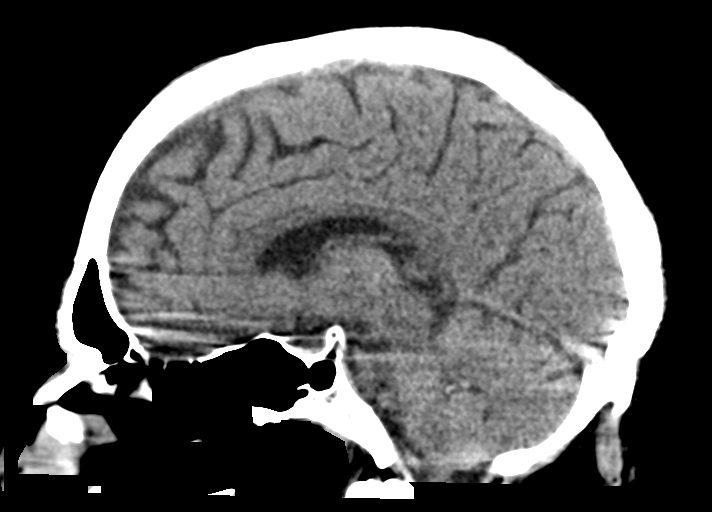
[im 37/55  brain]
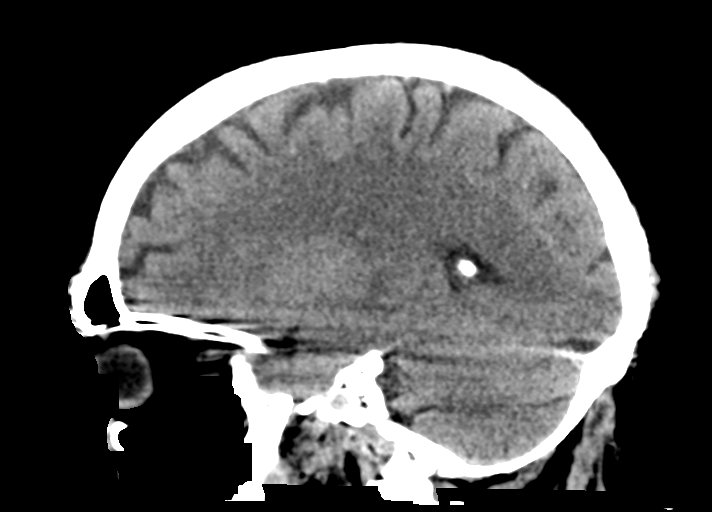

[15 of 47 positions shown; findings below may reference images not displayed]

FINDINGS: Brain: Remote right frontal lobe infarct with encephalomalacia.
Chronic mild microvascular ischemic disease of periventricular white
matter is redemonstrated. No hydrocephalus. Midline fourth ventricle
without effacement. Midline patent basal cisterns. Brainstem and
cerebellum are nonacute.

Vascular: No hyperdense vessel or unexpected calcification.

Skull: Normal. Negative for fracture or focal lesion.

Sinuses/Orbits: No acute finding.

Other: None.
IMPRESSION: Remote right frontal lobe infarct with encephalomalacia. No acute
intracranial abnormality.

## 2018-11-23 ENCOUNTER — Other Ambulatory Visit: Payer: Self-pay | Admitting: Cardiology

## 2018-12-15 ENCOUNTER — Ambulatory Visit (INDEPENDENT_AMBULATORY_CARE_PROVIDER_SITE_OTHER): Payer: Self-pay | Admitting: *Deleted

## 2018-12-15 DIAGNOSIS — Z5181 Encounter for therapeutic drug level monitoring: Secondary | ICD-10-CM

## 2018-12-15 DIAGNOSIS — I6350 Cerebral infarction due to unspecified occlusion or stenosis of unspecified cerebral artery: Secondary | ICD-10-CM

## 2018-12-15 DIAGNOSIS — I513 Intracardiac thrombosis, not elsewhere classified: Secondary | ICD-10-CM

## 2018-12-15 LAB — POCT INR: INR: 2.5 (ref 2.0–3.0)

## 2018-12-15 NOTE — Patient Instructions (Signed)
Continue coumadin 1 tablet daily except 1 1/2 tablets on Mondays, Wednesdays and Fridays. Recheck in 4 weeks.  Continue greens. Call us with any medication changes or concerns.    

## 2019-01-12 ENCOUNTER — Ambulatory Visit (INDEPENDENT_AMBULATORY_CARE_PROVIDER_SITE_OTHER): Payer: Self-pay | Admitting: Pharmacist

## 2019-01-12 DIAGNOSIS — Z5181 Encounter for therapeutic drug level monitoring: Secondary | ICD-10-CM

## 2019-01-12 DIAGNOSIS — I513 Intracardiac thrombosis, not elsewhere classified: Secondary | ICD-10-CM

## 2019-01-12 LAB — POCT INR: INR: 2 (ref 2.0–3.0)

## 2019-01-12 NOTE — Patient Instructions (Signed)
Description   Continue coumadin 1 tablet daily except 1 1/2 tablets on Mondays, Wednesdays and Fridays. Recheck in 4 weeks.  Continue greens. Call us with any medication changes or concerns.

## 2019-02-09 ENCOUNTER — Ambulatory Visit (INDEPENDENT_AMBULATORY_CARE_PROVIDER_SITE_OTHER): Payer: Self-pay | Admitting: Pharmacist

## 2019-02-09 DIAGNOSIS — I513 Intracardiac thrombosis, not elsewhere classified: Secondary | ICD-10-CM

## 2019-02-09 DIAGNOSIS — Z5181 Encounter for therapeutic drug level monitoring: Secondary | ICD-10-CM

## 2019-02-09 LAB — POCT INR: INR: 2.5 (ref 2.0–3.0)

## 2019-02-09 NOTE — Patient Instructions (Signed)
Description   Continue coumadin 1 tablet daily except 1 1/2 tablets on Mondays, Wednesdays and Fridays. Recheck in 6 weeks.  Continue greens. Call us with any medication changes or concerns.

## 2019-02-23 ENCOUNTER — Other Ambulatory Visit: Payer: Self-pay

## 2019-02-23 MED ORDER — METOPROLOL TARTRATE 25 MG PO TABS: 25.0000 mg | ORAL_TABLET | Freq: Two times a day (BID) | ORAL | 3 refills | Status: DC

## 2019-02-23 NOTE — Telephone Encounter (Signed)
Refilled 90 day to [facility] of lopressor 25 mg BID

## 2019-03-19 ENCOUNTER — Telehealth: Payer: Self-pay | Admitting: *Deleted

## 2019-03-19 NOTE — Telephone Encounter (Signed)
1. Have you recently travelled abroad or to [place], [place], or [place]? NO 2. Do you currently have a fever? NO 3. Have you been in contact with someone that is currently pending confirmation of COVID19 testing or has been confirmed to have the COVID19 virus? NO 4. Are you currently experiencing fatigue or cough? NO 5. Are you currently experiencing new or worsening shortness of breath at rest or with minimal activity? NO 6. Have you been in contact with someone that was recently sick with fever/cough/fatigue? NO   **A score of 4 or more should result in cancellation of the pts cardiology appt  **A score of 2 should be provided a mask prior to admission into the lobby  **TRAVEL to a high risk area or contact with a confirmed case should stay at home, away from confirmed patient, monitor symptoms, and reach out to PCP for evisit, additional testing.   **ALL PTS WITH FEVER SHOULD BE REFERRED TO PCP FOR EVISIT  Pt. Advised that we are restricting visitors at this time and request that only patients present for check-in prior to their appointment. All other visitors should remain in their car. If necessary, only one visitor may come with the patient into the building. For everyone's safety, all patients and visitors entering our practice area should expect to be screened again prior to entering our waiting area.   PT CONFIRMED APPT AND VERBALIZED UNDERSTANDING OF ABOVE. 

## 2019-03-23 ENCOUNTER — Other Ambulatory Visit: Payer: Self-pay

## 2019-03-23 ENCOUNTER — Ambulatory Visit (INDEPENDENT_AMBULATORY_CARE_PROVIDER_SITE_OTHER): Payer: Self-pay | Admitting: *Deleted

## 2019-03-23 DIAGNOSIS — I6350 Cerebral infarction due to unspecified occlusion or stenosis of unspecified cerebral artery: Secondary | ICD-10-CM

## 2019-03-23 DIAGNOSIS — I513 Intracardiac thrombosis, not elsewhere classified: Secondary | ICD-10-CM

## 2019-03-23 DIAGNOSIS — Z5181 Encounter for therapeutic drug level monitoring: Secondary | ICD-10-CM

## 2019-03-23 LAB — POCT INR: INR: 3.1 — AB (ref 2.0–3.0)

## 2019-03-23 NOTE — Patient Instructions (Signed)
Continue coumadin 1 tablet daily except 1 1/2 tablets on Mondays, Wednesdays and Fridays. Recheck in 8 weeks.  Eat extra greens today and tomorrow. Call us with any medication changes or concerns.

## 2019-05-11 ENCOUNTER — Telehealth: Payer: Self-pay | Admitting: Cardiology

## 2019-05-11 MED ORDER — WARFARIN SODIUM 5 MG PO TABS: ORAL_TABLET | ORAL | 3 refills | Status: DC

## 2019-05-11 NOTE — Telephone Encounter (Signed)
Needing refill for warfarin (COUMADIN) 5 MG tablet [[id number]]

## 2019-05-11 NOTE — Telephone Encounter (Signed)
RX sent to [facility]

## 2019-05-18 ENCOUNTER — Other Ambulatory Visit: Payer: Self-pay

## 2019-05-18 ENCOUNTER — Ambulatory Visit (INDEPENDENT_AMBULATORY_CARE_PROVIDER_SITE_OTHER): Payer: Self-pay | Admitting: *Deleted

## 2019-05-18 DIAGNOSIS — I236 Thrombosis of atrium, auricular appendage, and ventricle as current complications following acute myocardial infarction: Secondary | ICD-10-CM

## 2019-05-18 DIAGNOSIS — I513 Intracardiac thrombosis, not elsewhere classified: Secondary | ICD-10-CM

## 2019-05-18 DIAGNOSIS — Z5181 Encounter for therapeutic drug level monitoring: Secondary | ICD-10-CM

## 2019-05-18 LAB — POCT INR: INR: 2.5 (ref 2.0–3.0)

## 2019-05-18 NOTE — Patient Instructions (Signed)
Continue coumadin 1 tablet daily except 1 1/2 tablets on Mondays, Wednesdays and Fridays. Recheck in 6 weeks.  Call us with any medication changes or concerns.    

## 2019-05-20 ENCOUNTER — Telehealth: Payer: Self-pay | Admitting: Cardiology

## 2019-05-20 MED ORDER — SIMVASTATIN 40 MG PO TABS: 40.0000 mg | ORAL_TABLET | Freq: Every day | ORAL | 3 refills | Status: DC

## 2019-05-20 NOTE — Telephone Encounter (Signed)
°*  STAT* If patient is at the pharmacy, call can be transferred to refill team.   1. Which medications need to be refilled? (please list name of each medication and dose if known) Simvastatin  2. Which pharmacy/location (including street and city if local pharmacy) is medication to be sent to? [facility]  3. Do they need a 30 day or 90 day supply? 30 Day supply

## 2019-05-20 NOTE — Telephone Encounter (Signed)
Virtual Visit Pre-Appointment Phone Call  "(Name), I am calling you today to discuss your upcoming appointment. We are currently trying to limit exposure to the virus that causes COVID-19 by seeing patients at home rather than in the office."  1. "What is the BEST phone number to call the day of the visit?" - include this in appointment notes  2. Do you have or have access to (through a family member/friend) a smartphone with video capability that we can use for your visit?" a. If yes - list this number in appt notes as cell (if different from BEST phone #) and list the appointment type as a VIDEO visit in appointment notes b. If no - list the appointment type as a PHONE visit in appointment notes  3. Confirm consent - "In the setting of the current Covid19 crisis, you are scheduled for a (phone or video) visit with your provider on (date) at (time).  Just as we do with many in-office visits, in order for you to participate in this visit, we must obtain consent.  If you'd like, I can send this to your mychart (if signed up) or email for you to review.  Otherwise, I can obtain your verbal consent now.  All virtual visits are billed to your insurance company just like a normal visit would be.  By agreeing to a virtual visit, we'd like you to understand that the technology does not allow for your provider to perform an examination, and thus may limit your provider's ability to fully assess your condition. If your provider identifies any concerns that need to be evaluated in person, we will make arrangements to do so.  Finally, though the technology is pretty good, we cannot assure that it will always work on either your or our end, and in the setting of a video visit, we may have to convert it to a phone-only visit.  In either situation, we cannot ensure that we have a secure connection.  Are you willing to proceed?" STAFF: Did the patient verbally acknowledge consent to telehealth visit? Document  YES/NO here: Yes  4. Advise patient to be prepared - "Two hours prior to your appointment, go ahead and check your blood pressure, pulse, oxygen saturation, and your weight (if you have the equipment to check those) and write them all down. When your visit starts, your provider will ask you for this information. If you have an Apple Watch or Kardia device, please plan to have heart rate information ready on the day of your appointment. Please have a pen and paper handy nearby the day of the visit as well."  5. Give patient instructions for MyChart download to smartphone OR [email or web address] as below if video visit (depending on what platform provider is using)  6. Inform patient they will receive a phone call 15 minutes prior to their appointment time (may be from unknown caller ID) so they should be prepared to answer    TELEPHONE CALL NOTE  Steve Vang has been deemed a candidate for a follow-up tele-health visit to limit community exposure during the Covid-19 pandemic. I spoke with the patient via phone to ensure availability of phone/video source, confirm preferred email & phone number, and discuss instructions and expectations.  I reminded Steve Vang to be prepared with any vital sign and/or heart rhythm information that could potentially be obtained via home monitoring, at the time of his visit. I reminded Steve Vang to expect a phone call prior to  his visit.  Terry L Goins 05/20/2019 1:24 PM

## 2019-05-20 NOTE — Telephone Encounter (Signed)
Refill complete 

## 2019-05-20 NOTE — Addendum Note (Signed)
Addended by: Kerney Elbe on: 05/20/2019 02:06 PM   Modules accepted: Orders

## 2019-05-26 NOTE — Progress Notes (Signed)
Virtual Visit via Telephone Note   This visit type was conducted due to national recommendations for restrictions regarding the COVID-19 Pandemic (e.g. social distancing) in an effort to limit this patient's exposure and mitigate transmission in our community.  Due to his co-morbid illnesses, this patient is at least at moderate risk for complications without adequate follow up.  This format is felt to be most appropriate for this patient at this time.  The patient did not have access to video technology/had technical difficulties with video requiring transitioning to audio format only (telephone).  All issues noted in this document were discussed and addressed.  No physical exam could be performed with this format.  Please refer to the patient's chart for his  consent to telehealth for [facility].   Date:  05/27/2019   ID:  Steve Vang, DOB 1966-09-22, MRN [id number]  Patient Location: Home Provider Location: Home  PCP:  Oval Linseyondiego, Richard, MD  Cardiologist:  Nona DellSamuel , MD Electrophysiologist:  None   Evaluation Performed:  Follow-Up Visit  Chief Complaint:   Cardiac follow-up  History of Present Illness:    Steve HeckFred H Gulley is a 53 y.o. male last seen in November 2019.  He did not have video access and we spoke by phone today.  He does not report any active angina symptoms or nitroglycerin use, stable NYHA class II dyspnea.  He is on Coumadin with follow-up in anticoagulation clinic. Recent INR was 2.5.  He does not report any bleeding problems.  We have also kept him on low-dose aspirin with history of CAD, LV mural thrombus and also previous stroke.  Follow-up lab work in November 2019 showed relative improvement in total cholesterol and LDL.  We will plan a follow-up FLP and LFTs on Zocor, he reports no intolerances.  I reviewed his remaining medications which are outlined below.  The patient does not have symptoms concerning for COVID-19 infection (fever, chills, cough, or  new shortness of breath).  He states that he has been social distancing.   Past Medical History:  Diagnosis Date  . CAD (coronary artery disease)    Multivessel, LVEF 45-50%  . Cardiomyopathy (HCC)    LVEF 50-55% December 2016  . CKD (chronic kidney disease) stage 2, GFR 60-89 ml/min   . COPD (chronic obstructive pulmonary disease) (HCC)   . Depression   . Essential hypertension   . Falls   . Gallstone   . Gastroenteritis   . History of stroke 2004  . Hyperlipidemia   . Left ventricular mural thrombus    On Coumadin   Past Surgical History:  Procedure Laterality Date  . CORONARY ARTERY BYPASS GRAFT     DOR anterior ventricular restoration surgery [facility]/06[facility] [place] to first diagonal, SVG to PLB, SVG to RVE branch of nondominant RCA  . EYE SURGERY    . TOTAL HIP ARTHROPLASTY Left 06/08/2016   Procedure: TOTAL HIP ARTHROPLASTY ANTERIOR APPROACH;  Surgeon: Kathryne Hitchhristopher Y Blackman, MD;  Location: [facility];  Service: Orthopedics;  Laterality: Left;  . TOTAL HIP ARTHROPLASTY Right 07/20/2016   Procedure: RIGHT TOTAL HIP ARTHROPLASTY ANTERIOR APPROACH;  Surgeon: Kathryne Hitchhristopher Y Blackman, MD;  Location: [facility];  Service: Orthopedics;  Laterality: Right;     Current Meds  Medication Sig  . aspirin EC 81 MG tablet Take 81 mg by mouth daily.  Marland Kitchen. HYDROcodone-acetaminophen (NORCO) 5-325 MG tablet Take 2 tablets by mouth every 4 (four) hours as needed.  Marland Kitchen. lisinopril (PRINIVIL,ZESTRIL) 10 MG tablet Take  1 tablet (10 mg total) by mouth daily.  . metoprolol tartrate (LOPRESSOR) 25 MG tablet Take 1 tablet (25 mg total) by mouth 2 (two) times daily.  . nitroGLYCERIN (NITROSTAT) 0.4 MG SL tablet Place 1 tablet (0.4 mg total) under the tongue every 5 (five) minutes as needed for chest pain.  . simvastatin (ZOCOR) 40 MG tablet Take 1 tablet (40 mg total) by mouth daily.  Marland Kitchen warfarin (COUMADIN) 5 MG tablet Take 1 tablet daily except 1 1/2 tablets on Mondays, Wednesdays and Fridays or as directed      Allergies:   Benadryl [diphenhydramine] and Chantix [varenicline]   Social History   Tobacco Use  . Smoking status: Current Every Day Smoker    Packs/day: 0.25    Years: 30.00    Pack years: 7.50    Types: Cigarettes  . Smokeless tobacco: Never Used  Substance Use Topics  . Alcohol use: No    Alcohol/week: 0.0 standard drinks    Comment: hx of alcohol use   . Drug use: Yes    Types: Marijuana    Comment:  marijuana use     Family Hx: The patient's family history includes Diabetes in his mother; Hypertension in his mother.  ROS:   Please see the history of present illness.    Intermittent hip pain. All other systems reviewed and are negative.   Prior CV studies:   The following studies were reviewed today:  Echocardiogram 12/28/2015: Study Conclusions  - Left ventricle: The cavity size was normal. There was mild concentric hypertrophy. Systolic function was normal. The estimated ejection fraction was in the range of 50% to 55%. Features are consistent with a pseudonormal left ventricular filling pattern, with concomitant abnormal relaxation and increased filling pressure (grade 2 diastolic dysfunction). Doppler parameters are consistent with high ventricular filling pressure. - Regional wall motion abnormality: Akinesis of the mid anteroseptal myocardium; mild hypokinesis of the apical septal myocardium; probable mild hypokinesis of the apical myocardium. - Aortic valve: Mildly to moderately calcified annulus. - Mitral valve: Calcified annulus. Mildly thickened leaflets . - Right ventricle: Systolic function was mildly reduced.  Impressions:  - While the apical endocardium was suboptimally visualized, there was no obvious thrombus seen.  Labs/Other Tests and Data Reviewed:    EKG:  An ECG dated 02/26/2018 was personally reviewed today and demonstrated:  Old extensive sinus rhythm with old extensive anterolateral infarct and diffuse  repolarization abnormalities.  Recent Labs: 11/11/2018: ALT 39; BUN 15; Creatinine, Ser 1.17; Hemoglobin 16.7; Platelets 247; Potassium 4.0; Sodium 139   Recent Lipid Panel Lab Results  Component Value Date/Time   CHOL 166 11/11/2018 09:37 AM   TRIG 153 (H) 11/11/2018 09:37 AM   HDL 39 (L) 11/11/2018 09:37 AM   CHOLHDL 4.3 11/11/2018 09:37 AM   LDLCALC 96 11/11/2018 09:37 AM   LDLCALC 158 (H) 02/28/2018 07:41 AM    Wt Readings from Last 3 Encounters:  05/27/19 172 lb (78 kg)  11/11/18 171 lb (77.6 kg)  11/11/18 171 lb (77.6 kg)     Objective:    Vital Signs:  Ht 5\' 7"  (1.702 m)   Wt 172 lb (78 kg)   BMI 26.94 kg/m    He did not have a way to check his vital signs today. He spoke in full sentences on the phone, not short of breath. No audible wheezing. Speech pattern normal.  ASSESSMENT & PLAN:    1.  CAD status post CABG.  He reports no angina symptoms on medical  regimen, no recent nitroglycerin use.  Continue medical therapy and observation for now.  2.  Ischemic cardiomyopathy, subsequent improvement in LVEF, also history of LV mural thrombus.  He continues on long-term Coumadin prior history of stroke as well.  3.  Mixed hyperlipidemia, LDL down to 96 by last assessment, tolerating Zocor.  Check FLP and LFTs.  4.  Tobacco abuse, he has not been able to quit although we have had several discussions about smoking cessation.  COVID-19 Education: The signs and symptoms of COVID-19 were discussed with the patient and how to seek care for testing (follow up with PCP or arrange E-visit).  The importance of social distancing was discussed today.  Time:   Today, I have spent 5 minutes with the patient with telehealth technology discussing the above problems.     Medication Adjustments/Labs and Tests Ordered: Current medicines are reviewed at length with the patient today.  Concerns regarding medicines are outlined above.   Tests Ordered: Orders Placed This Encounter   Procedures  . Hepatic function panel  . Lipid Profile    Medication Changes: No orders of the defined types were placed in this encounter.   Disposition:  Follow up 6 months in the [place] office.  Signed, [name], MD  05/27/2019 8:50 AM    [facility]

## 2019-05-27 ENCOUNTER — Telehealth (INDEPENDENT_AMBULATORY_CARE_PROVIDER_SITE_OTHER): Payer: Self-pay | Admitting: Cardiology

## 2019-05-27 ENCOUNTER — Other Ambulatory Visit: Payer: Self-pay

## 2019-05-27 ENCOUNTER — Encounter: Payer: Self-pay | Admitting: Cardiology

## 2019-05-27 VITALS — Ht 67.0 in | Wt 172.0 lb

## 2019-05-27 DIAGNOSIS — I25119 Atherosclerotic heart disease of native coronary artery with unspecified angina pectoris: Secondary | ICD-10-CM

## 2019-05-27 DIAGNOSIS — Z7189 Other specified counseling: Secondary | ICD-10-CM

## 2019-05-27 DIAGNOSIS — Z7901 Long term (current) use of anticoagulants: Secondary | ICD-10-CM

## 2019-05-27 DIAGNOSIS — F17200 Nicotine dependence, unspecified, uncomplicated: Secondary | ICD-10-CM

## 2019-05-27 DIAGNOSIS — E782 Mixed hyperlipidemia: Secondary | ICD-10-CM

## 2019-05-27 DIAGNOSIS — I236 Thrombosis of atrium, auricular appendage, and ventricle as current complications following acute myocardial infarction: Secondary | ICD-10-CM

## 2019-05-27 DIAGNOSIS — I255 Ischemic cardiomyopathy: Secondary | ICD-10-CM

## 2019-05-27 NOTE — Patient Instructions (Signed)
Medication Instructions: Your physician recommends that you continue on your current medications as directed. Please refer to the Current Medication list given to you today.   Labwork: Fasting Lipids, Liver function test now  Procedures/Testing: None today  Follow-Up: 6 months with Dr.McDowell  Any Additional Special Instructions Will Be Listed Below (If Applicable).     If you need a refill on your cardiac medications before your next appointment, please call your pharmacy.       Thank you for choosing [facility] !

## 2019-06-05 ENCOUNTER — Telehealth: Payer: Self-pay

## 2019-06-05 LAB — HEPATIC FUNCTION PANEL
AG Ratio: 1.5 (calc) (ref 1.0–2.5)
ALT: 24 U/L (ref 9–46)
AST: 19 U/L (ref 10–35)
Albumin: 4.2 g/dL (ref 3.6–5.1)
Alkaline phosphatase (APISO): 81 U/L (ref 35–144)
Bilirubin, Direct: 0.1 mg/dL (ref 0.0–0.2)
Globulin: 2.8 g/dL (calc) (ref 1.9–3.7)
Indirect Bilirubin: 0.5 mg/dL (calc) (ref 0.2–1.2)
Total Bilirubin: 0.6 mg/dL (ref 0.2–1.2)
Total Protein: 7 g/dL (ref 6.1–8.1)

## 2019-06-05 LAB — LIPID PANEL
Cholesterol: 199 mg/dL (ref <200)
HDL: 39 mg/dL — ABNORMAL LOW (ref >=40)
LDL Cholesterol (Calc): 131 mg/dL (calc) — ABNORMAL HIGH
Non-HDL Cholesterol (Calc): 160 mg/dL (calc) — ABNORMAL HIGH (ref <130)
Total CHOL/HDL Ratio: 5.1 (calc) — ABNORMAL HIGH (ref <5.0)
Triglycerides: 154 mg/dL — ABNORMAL HIGH (ref <150)

## 2019-06-05 MED ORDER — ATORVASTATIN CALCIUM 40 MG PO TABS: 40.0000 mg | ORAL_TABLET | Freq: Every day | ORAL | 3 refills | Status: DC

## 2019-06-05 NOTE — Telephone Encounter (Signed)
Pt will switch to atorvastatin, e-scribed to [facility]

## 2019-06-05 NOTE — Telephone Encounter (Signed)
-----   Message from Jonelle Sidle, MD sent at 06/05/2019  4:10 PM EDT ----- Results reviewed.  LFTs are normal.  His LDL has come down to 131 from 158.  I think rather than increasing his Zocor to 80 mg daily, see if he would be willing to switch to atorvastatin 40 mg daily.

## 2019-08-10 ENCOUNTER — Other Ambulatory Visit: Payer: Self-pay

## 2019-08-10 ENCOUNTER — Ambulatory Visit (INDEPENDENT_AMBULATORY_CARE_PROVIDER_SITE_OTHER): Payer: BLUE CROSS/BLUE SHIELD | Admitting: *Deleted

## 2019-08-10 DIAGNOSIS — Z5181 Encounter for therapeutic drug level monitoring: Secondary | ICD-10-CM | POA: Diagnosis not present

## 2019-08-10 DIAGNOSIS — I513 Intracardiac thrombosis, not elsewhere classified: Secondary | ICD-10-CM | POA: Diagnosis not present

## 2019-08-10 LAB — POCT INR: INR: 3.8 — AB (ref 2.0–3.0)

## 2019-08-10 NOTE — Patient Instructions (Signed)
Hold coumadin tonight then resume 1 tablet daily except 1 1/2 tablets on Mondays, Wednesdays and Fridays. Recheck in 6 weeks.  Call us with any medication changes or concerns.

## 2019-08-27 ENCOUNTER — Telehealth: Payer: Self-pay | Admitting: Cardiology

## 2019-08-27 MED ORDER — LISINOPRIL 10 MG PO TABS: 10.0000 mg | ORAL_TABLET | Freq: Every day | ORAL | 3 refills | Status: DC

## 2019-08-27 NOTE — Telephone Encounter (Signed)
Refill on Lisinopril sent to [facility] / tg

## 2019-08-27 NOTE — Telephone Encounter (Signed)
done

## 2019-10-13 ENCOUNTER — Other Ambulatory Visit: Payer: Self-pay

## 2019-10-13 ENCOUNTER — Ambulatory Visit (INDEPENDENT_AMBULATORY_CARE_PROVIDER_SITE_OTHER): Payer: BC Managed Care – PPO | Admitting: *Deleted

## 2019-10-13 DIAGNOSIS — I513 Intracardiac thrombosis, not elsewhere classified: Secondary | ICD-10-CM

## 2019-10-13 DIAGNOSIS — Z5181 Encounter for therapeutic drug level monitoring: Secondary | ICD-10-CM

## 2019-10-13 LAB — POCT INR: INR: 2.3 (ref 2.0–3.0)

## 2019-10-13 NOTE — Patient Instructions (Signed)
Continue coumadin 1 tablet daily except 1 1/2 tablets on Mondays, Wednesdays and Fridays. Recheck in 6 weeks.  Call us with any medication changes or concerns.

## 2019-11-10 ENCOUNTER — Other Ambulatory Visit: Payer: Self-pay | Admitting: Cardiology

## 2019-11-10 MED ORDER — WARFARIN SODIUM 5 MG PO TABS: ORAL_TABLET | ORAL | 3 refills | Status: DC

## 2019-11-10 NOTE — Telephone Encounter (Signed)
Refill complete 

## 2019-11-24 ENCOUNTER — Ambulatory Visit (INDEPENDENT_AMBULATORY_CARE_PROVIDER_SITE_OTHER): Payer: BC Managed Care – PPO | Admitting: *Deleted

## 2019-11-24 ENCOUNTER — Other Ambulatory Visit: Payer: Self-pay

## 2019-11-24 DIAGNOSIS — I513 Intracardiac thrombosis, not elsewhere classified: Secondary | ICD-10-CM

## 2019-11-24 DIAGNOSIS — Z5181 Encounter for therapeutic drug level monitoring: Secondary | ICD-10-CM

## 2019-11-24 LAB — POCT INR: INR: 3 (ref 2.0–3.0)

## 2019-11-24 NOTE — Patient Instructions (Signed)
Continue warfarin 1 tablet daily except 1 1/2 tablets on Mondays, Wednesdays and Fridays Recheck in 6 weeks   

## 2020-01-05 ENCOUNTER — Other Ambulatory Visit: Payer: Self-pay

## 2020-01-05 ENCOUNTER — Ambulatory Visit (INDEPENDENT_AMBULATORY_CARE_PROVIDER_SITE_OTHER): Payer: BC Managed Care – PPO | Admitting: *Deleted

## 2020-01-05 DIAGNOSIS — I513 Intracardiac thrombosis, not elsewhere classified: Secondary | ICD-10-CM | POA: Diagnosis not present

## 2020-01-05 DIAGNOSIS — Z5181 Encounter for therapeutic drug level monitoring: Secondary | ICD-10-CM | POA: Diagnosis not present

## 2020-01-05 LAB — POCT INR: INR: 2.4 (ref 2.0–3.0)

## 2020-01-05 NOTE — Patient Instructions (Signed)
Continue warfarin 1 tablet daily except 1 1/2 tablets on Mondays, Wednesdays and Fridays Recheck in 6 weeks   

## 2020-02-16 ENCOUNTER — Ambulatory Visit (INDEPENDENT_AMBULATORY_CARE_PROVIDER_SITE_OTHER): Payer: BC Managed Care – PPO | Admitting: *Deleted

## 2020-02-16 ENCOUNTER — Other Ambulatory Visit: Payer: Self-pay

## 2020-02-16 DIAGNOSIS — I513 Intracardiac thrombosis, not elsewhere classified: Secondary | ICD-10-CM | POA: Diagnosis not present

## 2020-02-16 DIAGNOSIS — Z5181 Encounter for therapeutic drug level monitoring: Secondary | ICD-10-CM

## 2020-02-16 LAB — POCT INR: INR: 3.5 — AB (ref 2.0–3.0)

## 2020-02-16 NOTE — Patient Instructions (Signed)
Hold warfarin tonight then resume 1 tablet daily except 1 1/2 tablets on Mondays, Wednesdays and Fridays Be consistent with greens Recheck in 6 weeks

## 2020-03-29 ENCOUNTER — Ambulatory Visit (INDEPENDENT_AMBULATORY_CARE_PROVIDER_SITE_OTHER): Payer: BC Managed Care – PPO | Admitting: *Deleted

## 2020-03-29 ENCOUNTER — Other Ambulatory Visit: Payer: Self-pay

## 2020-03-29 DIAGNOSIS — I513 Intracardiac thrombosis, not elsewhere classified: Secondary | ICD-10-CM | POA: Diagnosis not present

## 2020-03-29 DIAGNOSIS — Z5181 Encounter for therapeutic drug level monitoring: Secondary | ICD-10-CM | POA: Diagnosis not present

## 2020-03-29 LAB — POCT INR: INR: 3.1 — AB (ref 2.0–3.0)

## 2020-03-29 NOTE — Patient Instructions (Signed)
Decrease warfarin to 1 tablet daily except 1 1/2 tablets on Mondays and Fridays Be consistent with greens Recheck in 6 weeks

## 2020-04-15 ENCOUNTER — Other Ambulatory Visit: Payer: Self-pay | Admitting: Cardiology

## 2020-04-18 ENCOUNTER — Telehealth: Payer: Self-pay

## 2020-04-18 NOTE — Telephone Encounter (Signed)
  Patient Consent for Virtual Visit         Steve Vang has provided verbal consent on 04/18/2020 for a virtual visit (video or telephone).   CONSENT FOR VIRTUAL VISIT FOR:  Steve Vang  By participating in this virtual visit I agree to the following:  I hereby voluntarily request, consent and authorize [facility] and its employed or contracted physicians, physician assistants, nurse practitioners or other licensed health care professionals (the Practitioner), to provide me with telemedicine health care services (the "Services") as deemed necessary by the treating Practitioner. I acknowledge and consent to receive the Services by the Practitioner via telemedicine. I understand that the telemedicine visit will involve communicating with the Practitioner through live audiovisual communication technology and the disclosure of certain medical information by electronic transmission. I acknowledge that I have been given the opportunity to request an in-person assessment or other available alternative prior to the telemedicine visit and am voluntarily participating in the telemedicine visit.  I understand that I have the right to withhold or withdraw my consent to the use of telemedicine in the course of my care at any time, without affecting my right to future care or treatment, and that the Practitioner or I may terminate the telemedicine visit at any time. I understand that I have the right to inspect all information obtained and/or recorded in the course of the telemedicine visit and may receive copies of available information for a reasonable fee.  I understand that some of the potential risks of receiving the Services via telemedicine include:  Marland Kitchen Delay or interruption in medical evaluation due to technological equipment failure or disruption; . Information transmitted may not be sufficient (e.g. poor resolution of images) to allow for appropriate medical decision making by the Practitioner;  and/or  . In rare instances, security protocols could fail, causing a breach of personal health information.  Furthermore, I acknowledge that it is my responsibility to provide information about my medical history, conditions and care that is complete and accurate to the best of my ability. I acknowledge that Practitioner's advice, recommendations, and/or decision may be based on factors not within their control, such as incomplete or inaccurate data provided by me or distortions of diagnostic images or specimens that may result from electronic transmissions. I understand that the practice of medicine is not an exact science and that Practitioner makes no warranties or guarantees regarding treatment outcomes. I acknowledge that a copy of this consent can be made available to me via my patient portal [facility] MyChart), or I can request a printed copy by calling the office of [facility].    I understand that my insurance will be billed for this visit.   I have read or had this consent read to me. . I understand the contents of this consent, which adequately explains the benefits and risks of the Services being provided via telemedicine.  . I have been provided ample opportunity to ask questions regarding this consent and the Services and have had my questions answered to my satisfaction. . I give my informed consent for the services to be provided through the use of telemedicine in my medical care

## 2020-04-20 NOTE — Progress Notes (Signed)
Virtual Visit via Telephone Note   This visit type was conducted due to national recommendations for restrictions regarding the COVID-19 Pandemic (e.g. social distancing) in an effort to limit this patient's exposure and mitigate transmission in our community.  Due to his co-morbid illnesses, this patient is at least at moderate risk for complications without adequate follow up.  This format is felt to be most appropriate for this patient at this time.  The patient did not have access to video technology/had technical difficulties with video requiring transitioning to audio format only (telephone).  All issues noted in this document were discussed and addressed.  No physical exam could be performed with this format.  Please refer to the patient's chart for his  consent to telehealth for [facility].   The patient was identified using 2 identifiers.  Date:  04/21/2020   ID:  Steve Vang, DOB 1966/11/14, MRN [id number]  Patient Location: Home Provider Location: Home  PCP:  Lucia Gaskins, MD  Cardiologist:  Rozann Lesches, MD  Electrophysiologist:  None   Evaluation Performed:  Follow-Up Visit  Chief Complaint:  Cardiac follow-up  History of Present Illness:    Steve Vang is a 54 y.o. male last assessed via telehealth encounter in May 2020.  We spoke by phone today.  He does not report any active angina or nitroglycerin use at this time.  He remains on Coumadin with follow-up in the anticoagulation clinic.  No reported bleeding problems.  Since last lipid panel we switched him from Zocor to Lipitor.  He has tolerated this well.  We plan to check FLP and LFTs.  He states that he has had both doses of the coronavirus vaccine.   Past Medical History:  Diagnosis Date  . CAD (coronary artery disease)    Multivessel, LVEF 45-50%  . Cardiomyopathy ([place])    LVEF 50-55% December 2016  . CKD (chronic kidney disease) stage 2, GFR 60-89 ml/min   . COPD (chronic obstructive  pulmonary disease) ([place])   . Depression   . Essential hypertension   . Falls   . Gallstone   . Gastroenteritis   . History of stroke 2004  . Hyperlipidemia   . Left ventricular mural thrombus    On Coumadin   Past Surgical History:  Procedure Laterality Date  . CORONARY ARTERY BYPASS GRAFT     DOR anterior ventricular restoration surgery 8/06, [facility] [place] to first diagonal, SVG to PLB, SVG to RVE branch of nondominant RCA  . EYE SURGERY    . TOTAL HIP ARTHROPLASTY Left 06/08/2016   Procedure: TOTAL HIP ARTHROPLASTY ANTERIOR APPROACH;  Surgeon: Mcarthur Rossetti, MD;  Location: [facility];  Service: Orthopedics;  Laterality: Left;  . TOTAL HIP ARTHROPLASTY Right 07/20/2016   Procedure: RIGHT TOTAL HIP ARTHROPLASTY ANTERIOR APPROACH;  Surgeon: Mcarthur Rossetti, MD;  Location: [facility];  Service: Orthopedics;  Laterality: Right;     Current Meds  Medication Sig  . aspirin EC 81 MG tablet Take 81 mg by mouth daily.  Marland Kitchen atorvastatin (LIPITOR) 40 MG tablet Take 1 tablet (40 mg total) by mouth daily at 6 PM.  . HYDROcodone-acetaminophen (NORCO) 5-325 MG tablet Take 2 tablets by mouth every 4 (four) hours as needed.  Marland Kitchen lisinopril (ZESTRIL) 10 MG tablet Take 1 tablet (10 mg total) by mouth daily.  . metoprolol tartrate (LOPRESSOR) 25 MG tablet Take 1 tablet by mouth twice daily  . nitroGLYCERIN (NITROSTAT) 0.4 MG SL tablet Place 1 tablet (0.4  mg total) under the tongue every 5 (five) minutes as needed for chest pain.  Marland Kitchen warfarin (COUMADIN) 5 MG tablet Take 1 tablet daily except 1 1/2 tablets on Mondays, Wednesdays and Fridays or as directed     Allergies:   Benadryl [diphenhydramine] and Chantix [varenicline]   ROS:   No palpitations or syncope.  Prior CV studies:   The following studies were reviewed today:  Echocardiogram 12/28/2015: Study Conclusions  - Left ventricle: The cavity size was normal. There was mild concentric hypertrophy. Systolic function was  normal. The estimated ejection fraction was in the range of 50% to 55%. Features are consistent with a pseudonormal left ventricular filling pattern, with concomitant abnormal relaxation and increased filling pressure (grade 2 diastolic dysfunction). Doppler parameters are consistent with high ventricular filling pressure. - Regional wall motion abnormality: Akinesis of the mid anteroseptal myocardium; mild hypokinesis of the apical septal myocardium; probable mild hypokinesis of the apical myocardium. - Aortic valve: Mildly to moderately calcified annulus. - Mitral valve: Calcified annulus. Mildly thickened leaflets . - Right ventricle: Systolic function was mildly reduced.  Impressions:  - While the apical endocardium was suboptimally visualized, there was no obvious thrombus seen.  Labs/Other Tests and Data Reviewed:    EKG:  An ECG dated 02/26/2018 was personally reviewed today and demonstrated:  Sinus rhythm with old extensive anterolateral infarct and diffuse repolarization abnormalities.  Recent Labs: 06/05/2019: ALT 24   Recent Lipid Panel Lab Results  Component Value Date/Time   CHOL 199 06/05/2019 08:03 AM   TRIG 154 (H) 06/05/2019 08:03 AM   HDL 39 (L) 06/05/2019 08:03 AM   CHOLHDL 5.1 (H) 06/05/2019 08:03 AM   LDLCALC 131 (H) 06/05/2019 08:03 AM    Wt Readings from Last 3 Encounters:  04/21/20 167 lb (75.8 kg)  05/27/19 172 lb (78 kg)  11/11/18 171 lb (77.6 kg)     Objective:    Vital Signs:  Ht 5\' 7"  (1.702 m)   Wt 167 lb (75.8 kg)   BMI 26.16 kg/m    Unable to obtain vital signs today. Patient spoke in full sentences, not short of breath. No audible wheezing or coughing.  ASSESSMENT & PLAN:    1.  Multivessel CAD status post CABG.  He is doing well without angina on medical therapy.  Continue aspirin, lisinopril, Lopressor, and Lipitor.  2.  History of ischemic cardiomyopathy and LV mural thrombus.  In light of this and prior  history of stroke we have continued chronic Coumadin.  Keep follow-up in the anticoagulation clinic.  Last echocardiogram revealed LVEF 50 to 55% range.  3.  Mixed hyperlipidemia, tolerating Lipitor at 40 mg daily.  We will follow-up FLP and LFTs.   Time:   Today, I have spent 5 minutes with the patient with telehealth technology discussing the above problems.     Medication Adjustments/Labs and Tests Ordered: Current medicines are reviewed at length with the patient today.  Concerns regarding medicines are outlined above.   Tests Ordered: Orders Placed This Encounter  Procedures  . Hepatic function panel  . Lipid Profile    Medication Changes: No orders of the defined types were placed in this encounter.   Follow Up:  In Person 6 months in the [place] office.  Signed, Garrison, MD  04/21/2020 9:50 AM    [facility]

## 2020-04-21 ENCOUNTER — Telehealth (INDEPENDENT_AMBULATORY_CARE_PROVIDER_SITE_OTHER): Payer: BC Managed Care – PPO | Admitting: Cardiology

## 2020-04-21 ENCOUNTER — Encounter: Payer: Self-pay | Admitting: Cardiology

## 2020-04-21 ENCOUNTER — Other Ambulatory Visit: Payer: Self-pay

## 2020-04-21 VITALS — Ht 67.0 in | Wt 167.0 lb

## 2020-04-21 DIAGNOSIS — I255 Ischemic cardiomyopathy: Secondary | ICD-10-CM | POA: Diagnosis not present

## 2020-04-21 DIAGNOSIS — I513 Intracardiac thrombosis, not elsewhere classified: Secondary | ICD-10-CM | POA: Diagnosis not present

## 2020-04-21 DIAGNOSIS — Z7901 Long term (current) use of anticoagulants: Secondary | ICD-10-CM

## 2020-04-21 DIAGNOSIS — E782 Mixed hyperlipidemia: Secondary | ICD-10-CM | POA: Diagnosis not present

## 2020-04-21 DIAGNOSIS — I25119 Atherosclerotic heart disease of native coronary artery with unspecified angina pectoris: Secondary | ICD-10-CM

## 2020-04-21 DIAGNOSIS — Z951 Presence of aortocoronary bypass graft: Secondary | ICD-10-CM

## 2020-04-21 DIAGNOSIS — I25110 Atherosclerotic heart disease of native coronary artery with unstable angina pectoris: Secondary | ICD-10-CM | POA: Diagnosis not present

## 2020-04-21 DIAGNOSIS — Z8673 Personal history of transient ischemic attack (TIA), and cerebral infarction without residual deficits: Secondary | ICD-10-CM

## 2020-04-21 NOTE — Patient Instructions (Signed)
Medication Instructions:  Your physician recommends that you continue on your current medications as directed. Please refer to the Current Medication list given to you today.  *If you need a refill on your cardiac medications before your next appointment, please call your pharmacy*   Lab Work: Fasting Lipids and LFT's If you have labs (blood work) drawn today and your tests are completely normal, you will receive your results only by: Marland Kitchen MyChart Message (if you have MyChart) OR . A paper copy in the mail If you have any lab test that is abnormal or we need to change your treatment, we will call you to review the results.   Testing/Procedures: None today   Follow-Up: At [facility], you and your health needs are our priority.  As part of our continuing mission to provide you with exceptional heart care, we have created designated Provider Care Teams.  These Care Teams include your primary Cardiologist (physician) and Advanced Practice Providers (APPs -  Physician Assistants and Nurse Practitioners) who all work together to provide you with the care you need, when you need it.  We recommend signing up for the patient portal called "MyChart".  Sign up information is provided on this After Visit Summary.  MyChart is used to connect with patients for Virtual Visits (Telemedicine).  Patients are able to view lab/test results, encounter notes, upcoming appointments, etc.  Non-urgent messages can be sent to your provider as well.   To learn more about what you can do with MyChart, go to [email or web address].    Your next appointment:   6 month(s)  The format for your next appointment:   In Person  Provider:   [name], MD   Other Instructions None     Thank you for choosing [facility] !

## 2020-05-10 ENCOUNTER — Emergency Department (HOSPITAL_COMMUNITY)
Admission: EM | Admit: 2020-05-10 | Discharge: 2020-05-10 | Disposition: A | Discharge status: Home / Self Care | Payer: BC Managed Care – PPO | Attending: Emergency Medicine | Admitting: Emergency Medicine

## 2020-05-10 ENCOUNTER — Encounter (HOSPITAL_COMMUNITY): Payer: Self-pay

## 2020-05-10 ENCOUNTER — Other Ambulatory Visit: Payer: Self-pay

## 2020-05-10 ENCOUNTER — Emergency Department (HOSPITAL_COMMUNITY): Payer: BC Managed Care – PPO

## 2020-05-10 ENCOUNTER — Ambulatory Visit (INDEPENDENT_AMBULATORY_CARE_PROVIDER_SITE_OTHER): Payer: BC Managed Care – PPO | Admitting: *Deleted

## 2020-05-10 DIAGNOSIS — I513 Intracardiac thrombosis, not elsewhere classified: Secondary | ICD-10-CM

## 2020-05-10 DIAGNOSIS — F17210 Nicotine dependence, cigarettes, uncomplicated: Secondary | ICD-10-CM | POA: Insufficient documentation

## 2020-05-10 DIAGNOSIS — Z7982 Long term (current) use of aspirin: Secondary | ICD-10-CM | POA: Insufficient documentation

## 2020-05-10 DIAGNOSIS — I10 Essential (primary) hypertension: Secondary | ICD-10-CM | POA: Diagnosis not present

## 2020-05-10 DIAGNOSIS — J449 Chronic obstructive pulmonary disease, unspecified: Secondary | ICD-10-CM | POA: Diagnosis not present

## 2020-05-10 DIAGNOSIS — Z5181 Encounter for therapeutic drug level monitoring: Secondary | ICD-10-CM | POA: Diagnosis not present

## 2020-05-10 DIAGNOSIS — Z79899 Other long term (current) drug therapy: Secondary | ICD-10-CM | POA: Diagnosis not present

## 2020-05-10 DIAGNOSIS — Z7901 Long term (current) use of anticoagulants: Secondary | ICD-10-CM | POA: Insufficient documentation

## 2020-05-10 DIAGNOSIS — I2510 Atherosclerotic heart disease of native coronary artery without angina pectoris: Secondary | ICD-10-CM | POA: Insufficient documentation

## 2020-05-10 DIAGNOSIS — M25511 Pain in right shoulder: Secondary | ICD-10-CM | POA: Insufficient documentation

## 2020-05-10 LAB — BASIC METABOLIC PANEL
Anion gap: 9 (ref 5–15)
BUN: 15 mg/dL (ref 6–20)
CO2: 27 mmol/L (ref 22–32)
Calcium: 9.5 mg/dL (ref 8.9–10.3)
Chloride: 99 mmol/L (ref 98–111)
Creatinine, Ser: 0.95 mg/dL (ref 0.61–1.24)
GFR calc Af Amer: >60 mL/min (ref >60)
GFR calc non Af Amer: >60 mL/min (ref >60)
Glucose, Bld: 102 mg/dL — ABNORMAL HIGH (ref 70–99)
Potassium: 3.5 mmol/L (ref 3.5–5.1)
Sodium: 135 mmol/L (ref 135–145)

## 2020-05-10 LAB — CBC WITH DIFFERENTIAL/PLATELET
Abs Immature Granulocytes: 0.01 10*3/uL (ref 0.00–0.07)
Basophils Absolute: 0.1 10*3/uL (ref 0.0–0.1)
Basophils Relative: 1 %
Eosinophils Absolute: 0.2 10*3/uL (ref 0.0–0.5)
Eosinophils Relative: 2 %
HCT: 50.8 % (ref 39.0–52.0)
Hemoglobin: 16.6 g/dL (ref 13.0–17.0)
Immature Granulocytes: 0 %
Lymphocytes Relative: 32 %
Lymphs Abs: 2.5 10*3/uL (ref 0.7–4.0)
MCH: 30.2 pg (ref 26.0–34.0)
MCHC: 32.7 g/dL (ref 30.0–36.0)
MCV: 92.5 fL (ref 80.0–100.0)
Monocytes Absolute: 0.9 10*3/uL (ref 0.1–1.0)
Monocytes Relative: 12 %
Neutro Abs: 4 10*3/uL (ref 1.7–7.7)
Neutrophils Relative %: 53 %
Platelets: 210 10*3/uL (ref 150–400)
RBC: 5.49 MIL/uL (ref 4.22–5.81)
RDW: 13.7 % (ref 11.5–15.5)
WBC: 7.6 10*3/uL (ref 4.0–10.5)
nRBC: 0 % (ref 0.0–0.2)

## 2020-05-10 LAB — TROPONIN I (HIGH SENSITIVITY)
Troponin I (High Sensitivity): 27 ng/L — ABNORMAL HIGH (ref <18)
Troponin I (High Sensitivity): 30 ng/L — ABNORMAL HIGH (ref <18)

## 2020-05-10 LAB — POCT INR: INR: 2.2 (ref 2.0–3.0)

## 2020-05-10 MED ORDER — OXYCODONE-ACETAMINOPHEN 5-325 MG PO TABS
1.0000 | ORAL_TABLET | Freq: Once | ORAL | Status: AC
  Administered 2020-05-10: 1 via ORAL
  Filled 2020-05-10: qty 1

## 2020-05-10 NOTE — ED Notes (Signed)
Denies chest pain 

## 2020-05-10 NOTE — Patient Instructions (Signed)
Continue warfarin 1 tablet daily except 1 1/2 tablets on Mondays and Fridays Be consistent with greens Recheck in 6 weeks  

## 2020-05-10 NOTE — Discharge Instructions (Signed)
Take Tylenol as needed for pain.  Please follow-up with your orthopedic doctor as soon as possible for continued evaluation.  Return to the emergency room as needed for new or worsening symptoms or concerns, such as new or worsening pain, fevers, numbness or any concerns at all.

## 2020-05-10 NOTE — ED Provider Notes (Signed)
[facility] EMERGENCY DEPARTMENT Provider Note   CSN: [id number] Arrival date & time: 05/10/20  1558     History Chief Complaint  Patient presents with  . Shoulder Pain    Steve Vang is a 54 y.o. male.  HPI  54 year old male, with a PMH of CAD with multiple heart attacks, CABG, COPD, CKD, HTN, presents with left shoulder pain. Patient states he has had this pain for the last week. He notes pain is worse with lifting his left arm. He describes pain as sharp, nonradiating. He denies any associated chest pain, shortness of breath, nausea, vomiting, diaphoresis. He denies any direct injury or trauma to the shoulder. He denies any fevers, chills, cough. He denies any numbness, tingling, weakness.      Past Medical History:  Diagnosis Date  . CAD (coronary artery disease)    Multivessel, LVEF 45-50%  . Cardiomyopathy ([place])    LVEF 50-55% December 2016  . CKD (chronic kidney disease) stage 2, GFR 60-89 ml/min   . COPD (chronic obstructive pulmonary disease) ([place])   . Depression   . Essential hypertension   . Falls   . Gallstone   . Gastroenteritis   . History of stroke 2004  . Hyperlipidemia   . Left ventricular mural thrombus    On Coumadin    Patient Active Problem List   Diagnosis Date Noted  . Avascular necrosis of bone of right hip ([place]) 07/20/2016  . 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 Avascular necrosis of bones of both hips ([place]) 06/08/2016  . Status post total replacement of left hip 06/08/2016  . AKI (acute kidney injury) ([place]) 05/15/2016  . Nausea and vomiting 05/15/2016  . Abdominal pain 05/15/2016  . Chest pain 05/15/2016  . Emesis   . Encounter for therapeutic drug monitoring 03/03/2014  . Long term (current) use of anticoagulants 04/16/2011  . GAIT IMBALANCE 02/02/2011  . ABNORMAL CV (STRESS) TEST 10/02/2010  . Mural thrombus of left ventricle ([facility]) 09/08/2010  . OTHER SPEC FORMS CHRONIC ISCHEMIC HEART DISEASE 06/07/2010  .  Mixed hyperlipidemia 03/02/2010  . TOBACCO ABUSE 03/02/2010  . Essential hypertension, benign 03/02/2010  . CORONARY ATHEROSCLEROSIS NATIVE CORONARY ARTERY 03/02/2010  . CVA 03/01/2010    Past Surgical History:  Procedure Laterality Date  . CORONARY ARTERY BYPASS GRAFT     DOR anterior ventricular restoration surgery 8/06, [facility] [place] to first diagonal, SVG to PLB, SVG to RVE branch of nondominant RCA  . EYE SURGERY    . TOTAL HIP ARTHROPLASTY Left 06/08/2016   Procedure: TOTAL HIP ARTHROPLASTY ANTERIOR APPROACH;  Surgeon: Mcarthur Rossetti, MD;  Location: [facility];  Service: Orthopedics;  Laterality: Left;  . TOTAL HIP ARTHROPLASTY Right 07/20/2016   Procedure: RIGHT TOTAL HIP ARTHROPLASTY ANTERIOR APPROACH;  Surgeon: Mcarthur Rossetti, MD;  Location: [facility];  Service: Orthopedics;  Laterality: Right;       Family History  Problem Relation Age of Onset  . Hypertension Mother   . Diabetes Mother     Social History   Tobacco Use  . Smoking status: Current Every Day Smoker    Packs/day: 0.50    Years: 30.00    Pack years: 15.00    Types: Cigarettes  . Smokeless tobacco: Never Used  Substance Use Topics  . Alcohol use: No    Alcohol/week: 0.0 standard drinks    Comment: hx of alcohol use   . Drug use: Not Currently    Types: Marijuana  Comment:  marijuana use    Home Medications Prior to Admission medications   Medication Sig Start Date End Date Taking? Authorizing Provider  aspirin EC 81 MG tablet Take 81 mg by mouth daily.    [provider]  atorvastatin (LIPITOR) 40 MG tablet Take 1 tablet (40 mg total) by mouth daily at 6 PM. 06/05/19 04/21/20  Jonelle Sidle, MD  HYDROcodone-acetaminophen (NORCO) 5-325 MG tablet Take 2 tablets by mouth every 4 (four) hours as needed. 01/31/17   Mancel Bale, MD  lisinopril (ZESTRIL) 10 MG tablet Take 1 tablet (10 mg total) by mouth daily. 08/27/19   Jonelle Sidle, MD  metoprolol tartrate  (LOPRESSOR) 25 MG tablet Take 1 tablet by mouth twice daily 04/15/20   Jonelle Sidle, MD  nitroGLYCERIN (NITROSTAT) 0.4 MG SL tablet Place 1 tablet (0.4 mg total) under the tongue every 5 (five) minutes as needed for chest pain. 03/20/18   Jonelle Sidle, MD  warfarin (COUMADIN) 5 MG tablet Take 1 tablet daily except 1 1/2 tablets on Mondays, Wednesdays and Fridays or as directed 11/10/19   Jonelle Sidle, MD    Allergies    Benadryl [diphenhydramine] and Chantix [varenicline]  Review of Systems   Review of Systems  Constitutional: Negative for chills and fever.  HENT: Negative for rhinorrhea and sore throat.   Eyes: Negative for visual disturbance.  Respiratory: Negative for cough and shortness of breath.   Cardiovascular: Negative for chest pain and leg swelling.  Gastrointestinal: Negative for abdominal pain, diarrhea, nausea and vomiting.  Genitourinary: Negative for dysuria, frequency and urgency.  Musculoskeletal: Positive for arthralgias.  Skin: Negative for rash and wound.  Neurological: Negative for syncope and numbness.  All other systems reviewed and are negative.   Physical Exam Updated Vital Signs BP (!) 176/106   Pulse 64   Temp 97.6 F (36.4 C) (Oral)   Resp 18   Ht 5\' 7"  (1.702 m)   Wt 78 kg   SpO2 100%   BMI 26.94 kg/m   Physical Exam Vitals and nursing note reviewed.  Constitutional:      Appearance: He is well-developed.  HENT:     Head: Normocephalic and atraumatic.  Eyes:     Conjunctiva/sclera: Conjunctivae normal.  Cardiovascular:     Rate and Rhythm: Normal rate and regular rhythm.     Heart sounds: Normal heart sounds. No murmur.  Pulmonary:     Effort: Pulmonary effort is normal. No respiratory distress.     Breath sounds: Normal breath sounds. No wheezing or rales.  Abdominal:     General: Bowel sounds are normal. There is no distension.     Palpations: Abdomen is soft.     Tenderness: There is no abdominal tenderness.    Musculoskeletal:        General: No deformity. Normal range of motion.     Right shoulder: Normal.     Left shoulder: Tenderness (pinpoint tenderness over lateral left shoulder) present. No swelling, deformity or crepitus. Normal strength. Normal pulse.     Cervical back: Neck supple.  Skin:    General: Skin is warm and dry.     Findings: No erythema or rash.  Neurological:     Mental Status: He is alert and oriented to person, place, and time.  Psychiatric:        Behavior: Behavior normal.     ED Results / Procedures / Treatments   Labs (all labs ordered are listed, but only abnormal  results are displayed) Labs Reviewed  CBC WITH DIFFERENTIAL/PLATELET  BASIC METABOLIC PANEL  TROPONIN I (HIGH SENSITIVITY)    EKG EKG Interpretation  Date/Time:  Tuesday May 10 2020 16:07:31 EDT Ventricular Rate:  64 PR Interval:  168 QRS Duration: 96 QT Interval:  440 QTC Calculation: 453 R Axis:   -71 Text Interpretation: Normal sinus rhythm Left anterior fascicular block Minimal voltage criteria for LVH, may be normal variant ( Cornell product ) Anterolateral infarct , age undetermined Abnormal ECG since 2017, no sig changes Confirmed by Eber Hong ([id number]) on 05/10/2020 4:12:55 PM   Radiology DG Shoulder Left  Result Date: 05/10/2020 CLINICAL DATA:  Left-sided shoulder pain for 2 months, no known injury, initial encounter EXAM: LEFT SHOULDER - 2+ VIEW COMPARISON:  06/23/2014 FINDINGS: There is no evidence of fracture or dislocation. No soft tissue abnormality is seen. Mild degenerative changes of the acromioclavicular joint are noted. IMPRESSION: No acute abnormality noted. Electronically Signed   By: Alcide Clever M.D.   On: 05/10/2020 17:03    Procedures Procedures (including critical care time)  Medications Ordered in ED Medications - No data to display  ED Course  I have reviewed the triage vital signs and the nursing notes.  Pertinent labs & imaging results that were  available during my care of the patient were reviewed by me and considered in my medical decision making (see chart for details).    MDM Rules/Calculators/A&P                        Patient presents with left shoulder pain.  Pain is easily reproducible.  History physical most consistent with a musculoskeletal shoulder pain.  However EKG obtained in triage which showed chronic fascicular block.  Patient had basic blood work and troponin x2.  No significant abnormality noted on blood work.  Troponin without significant change.  Patient feeling better after oxycodone.  He is able to move move his shoulder.  Imaging shows no fracture or dislocation.  Encourage follow-up with orthopedics.  Given that patient is on warfarin, we will not discharge on NSAID.  Encourage is Tylenol as needed for pain.  Given return precautions.  Patient ready and stable for discharge.    At this time there does not appear to be any evidence of an acute emergency medical condition and the patient appears stable for discharge with appropriate outpatient follow up.Diagnosis was discussed with patient who verbalizes understanding and is agreeable to discharge. Pt case discussed with Dr. Hyacinth Meeker who agrees with my plan.       Final Clinical Impression(s) / ED Diagnoses Final diagnoses:  None    Rx / DC Orders ED Discharge Orders    None       Rueben Bash 05/10/20 2211    Eber Hong, MD 05/11/20 1421

## 2020-05-10 NOTE — ED Triage Notes (Signed)
Pt presents to ED with complaints of left shoulder pain x 1 week. Pt denies injury. Pt denies SOB, dizziness, nausea.

## 2020-05-10 NOTE — ED Notes (Signed)
Pt says shoulder pops when it gets to shoulder level.  Rates pain 8/10.  Pt reports pain started one week ago, denies injury.  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  Pt says pain increases as the day goes on, and by night he is not able to left arm.

## 2020-05-18 IMAGING — DX DG CHEST 2V
2 series · 2 of 2 positions shown · non-contrast
Comparison: 05/15/2016

CLINICAL DATA: Left-sided chest pain, no known injury, initial
encounter

EXAM:
CHEST - 2 VIEW

[chest pa]
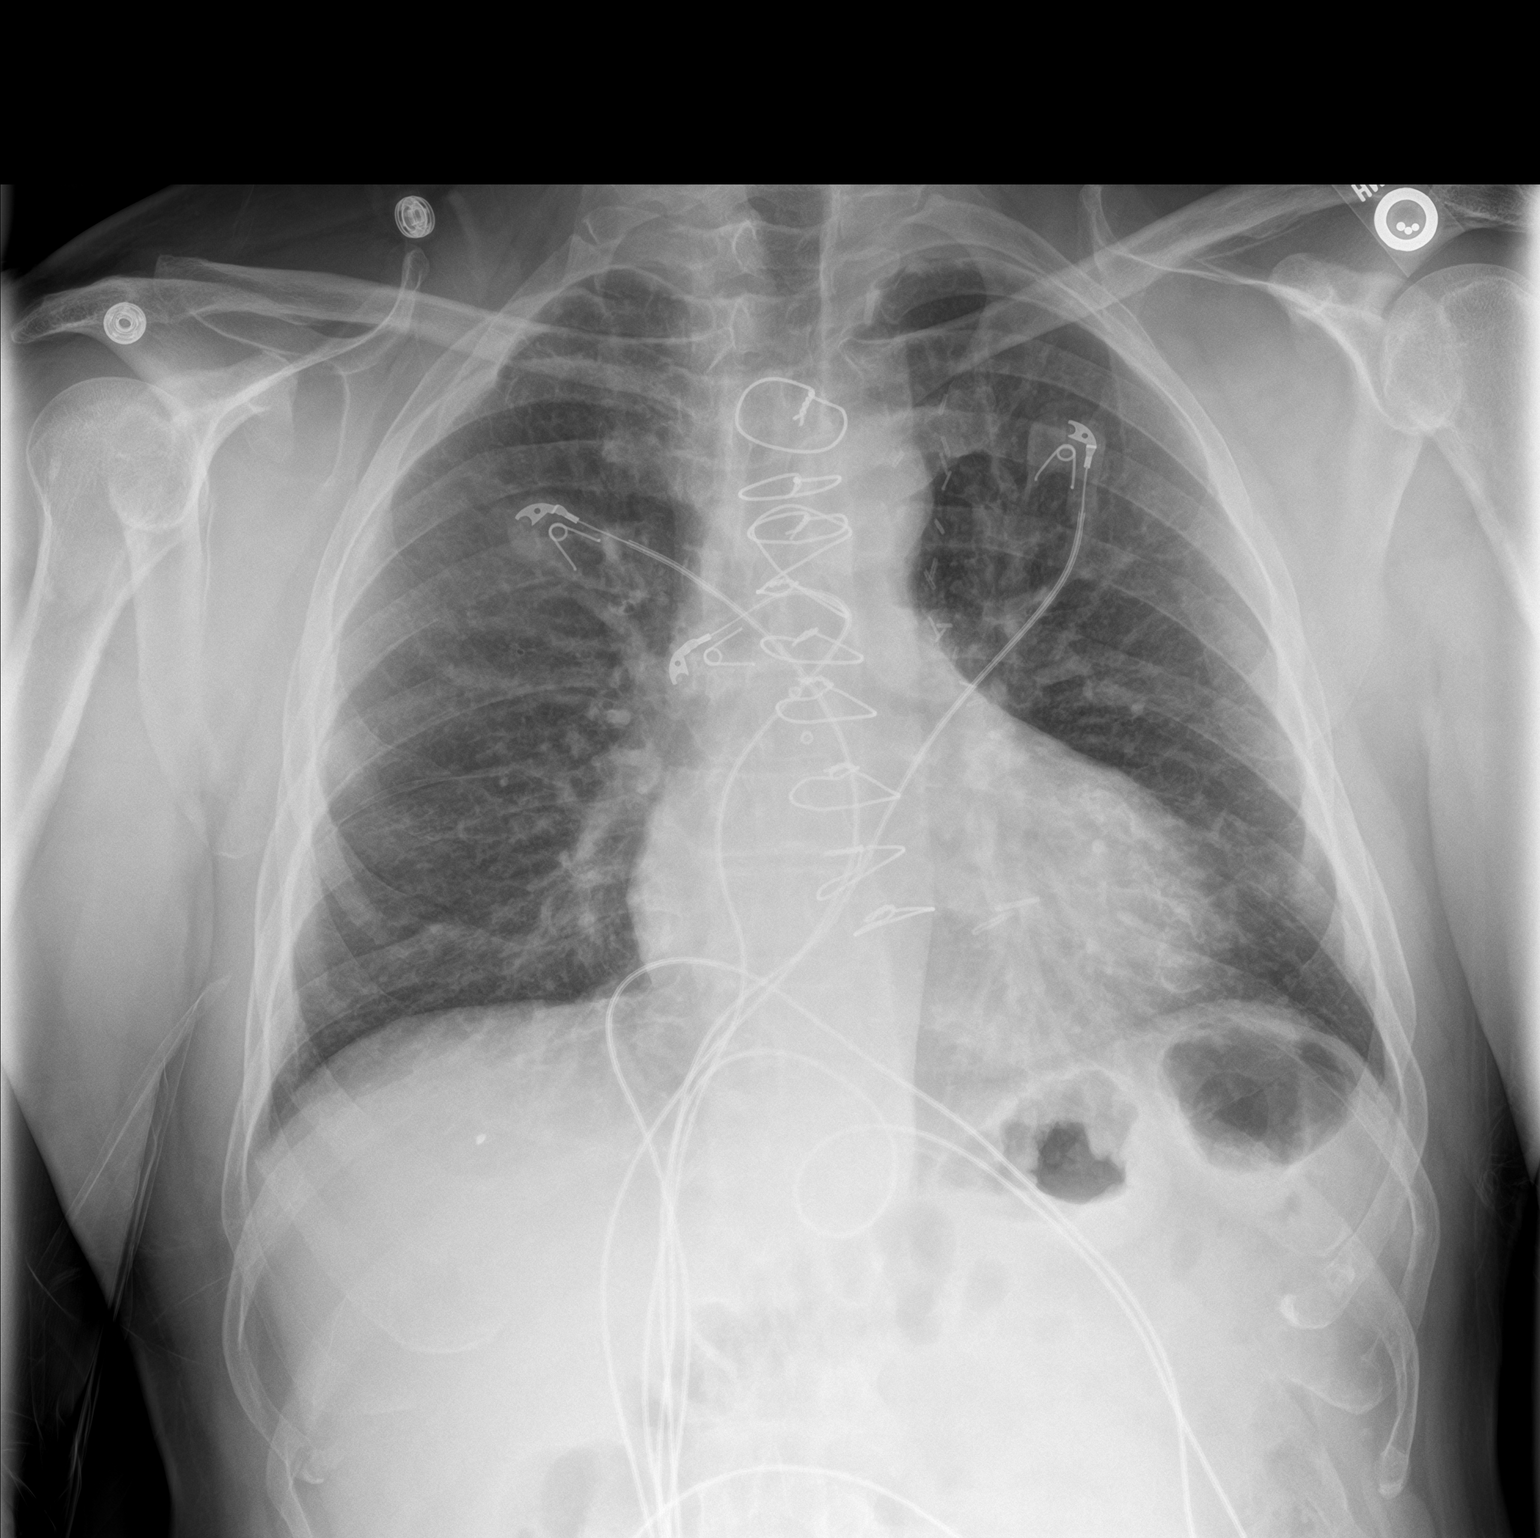

[chest lat]
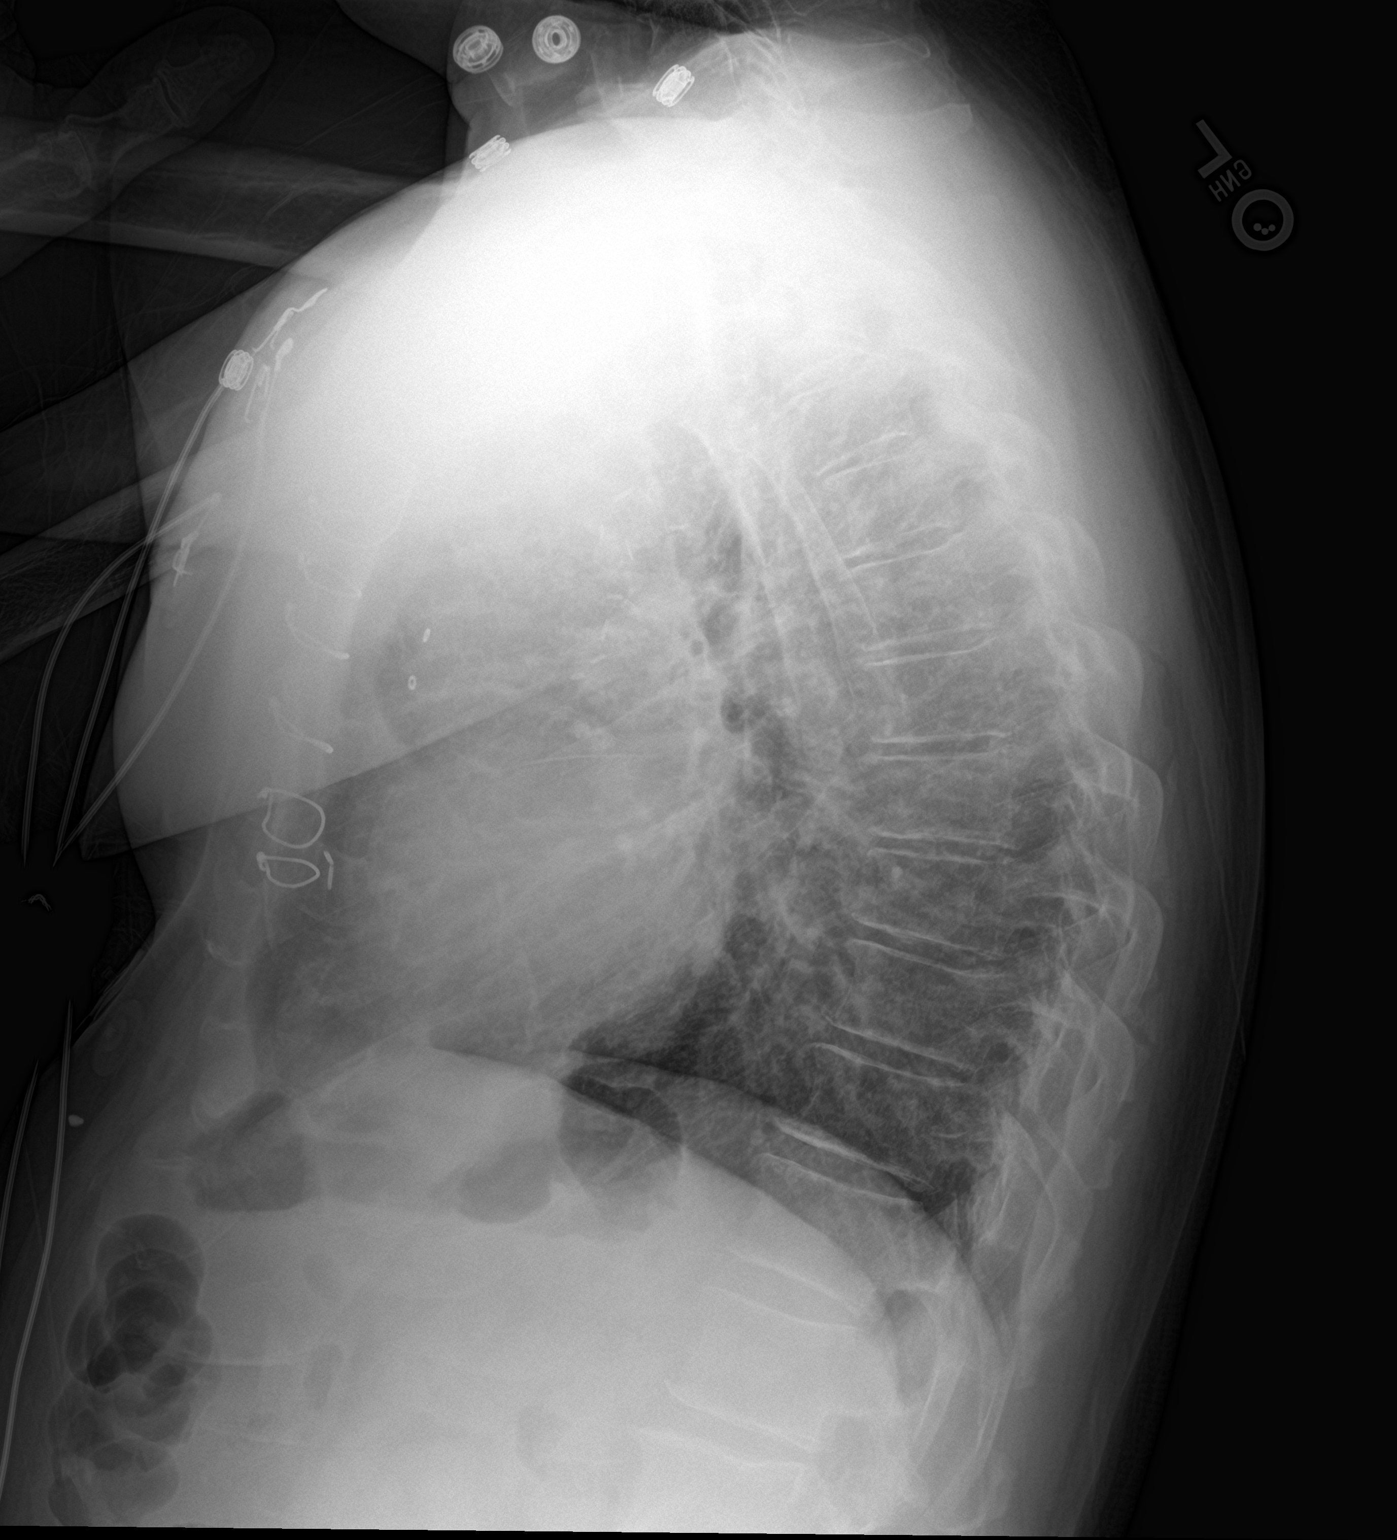

[2 of 2 positions shown; findings below may reference images not displayed]

FINDINGS: Cardiac shadow is stable. Postsurgical changes are again noted. The
lungs are well aerated bilaterally. No focal infiltrate or sizable
effusion is seen. No bony abnormality is noted.
IMPRESSION: No acute abnormality seen.

## 2020-05-19 ENCOUNTER — Ambulatory Visit (INDEPENDENT_AMBULATORY_CARE_PROVIDER_SITE_OTHER): Payer: BC Managed Care – PPO | Admitting: Orthopaedic Surgery

## 2020-05-19 ENCOUNTER — Other Ambulatory Visit: Payer: Self-pay

## 2020-05-19 ENCOUNTER — Encounter: Payer: Self-pay | Admitting: Orthopaedic Surgery

## 2020-05-19 VITALS — BP 157/93 | HR 63 | Ht 67.0 in | Wt 173.0 lb

## 2020-05-19 DIAGNOSIS — M25512 Pain in left shoulder: Secondary | ICD-10-CM | POA: Diagnosis not present

## 2020-05-19 DIAGNOSIS — G8929 Other chronic pain: Secondary | ICD-10-CM

## 2020-05-19 DIAGNOSIS — F17210 Nicotine dependence, cigarettes, uncomplicated: Secondary | ICD-10-CM | POA: Diagnosis not present

## 2020-05-19 MED ORDER — HYDROCODONE-ACETAMINOPHEN 5-325 MG PO TABS: ORAL_TABLET | ORAL | 0 refills | Status: DC

## 2020-05-19 NOTE — Progress Notes (Signed)
Subjective:    Patient ID: Steve Vang, male    DOB: 1966/08/10, 54 y.o.   MRN: [id number]  HPI He has had pain in the left shoulder for over a month getting worse over the last two weeks.  He has pain with overhead use and rolling on it at night.  He has no trauma, no weakness, no redness, no swelling.  He does not like to take pills.  Heat/ice do not help.  He has been seen and had x-rays done.  These were negative.  I have reviewed the prior notes.   Review of Systems  Constitutional: Positive for activity change.  Respiratory: Positive for shortness of breath.   Cardiovascular: Positive for palpitations.  Musculoskeletal: Positive for arthralgias and joint swelling.  All other systems reviewed and are negative.  For Review of Systems, all other systems reviewed and are negative.  The following is a summary of the past history medically, past history surgically, known current medicines, social history and family history.  This information is gathered electronically by the computer from prior information and documentation.  I review this each visit and have found including this information at this point in the chart is beneficial and informative.   Past Medical History:  Diagnosis Date  . CAD (coronary artery disease)    Multivessel, LVEF 45-50%  . Cardiomyopathy ([facility])    LVEF 50-55% December 2016  . CKD (chronic kidney disease) stage 2, GFR 60-89 ml/min   . COPD (chronic obstructive pulmonary disease) ([facility])   . Depression   . Essential hypertension   . Falls   . Gallstone   . Gastroenteritis   . History of stroke 2004  . Hyperlipidemia   . Left ventricular mural thrombus    On Coumadin    Past Surgical History:  Procedure Laterality Date  . CORONARY ARTERY BYPASS GRAFT     DOR anterior ventricular restoration surgery 8/06, [facility] [place] to first diagonal, SVG to PLB, SVG to RVE branch of nondominant RCA  . EYE SURGERY    . TOTAL HIP ARTHROPLASTY Left  06/08/2016   Procedure: TOTAL HIP ARTHROPLASTY ANTERIOR APPROACH;  Surgeon: Kathryne Hitch, MD;  Location: [facility];  Service: Orthopedics;  Laterality: Left;  . TOTAL HIP ARTHROPLASTY Right 07/20/2016   Procedure: RIGHT TOTAL HIP ARTHROPLASTY ANTERIOR APPROACH;  Surgeon: Kathryne Hitch, MD;  Location: [facility];  Service: Orthopedics;  Laterality: Right;    Current Outpatient Medications on File Prior to Visit  Medication Sig Dispense Refill  . aspirin EC 81 MG tablet Take 81 mg by mouth daily.    Marland Kitchen atorvastatin (LIPITOR) 40 MG tablet Take 1 tablet (40 mg total) by mouth daily at 6 PM. 90 tablet 3  . lisinopril (ZESTRIL) 10 MG tablet Take 1 tablet (10 mg total) by mouth daily. 90 tablet 3  . metoprolol tartrate (LOPRESSOR) 25 MG tablet Take 1 tablet by mouth twice daily (Patient taking differently: Take 25 mg by mouth 2 (two) times daily. ) 60 tablet 6  . nitroGLYCERIN (NITROSTAT) 0.4 MG SL tablet Place 1 tablet (0.4 mg total) under the tongue every 5 (five) minutes as needed for chest pain. 25 tablet 3  . warfarin (COUMADIN) 5 MG tablet Take 1 tablet daily except 1 1/2 tablets on Mondays, Wednesdays and Fridays or as directed (Patient taking differently: Take 5-7.5 mg by mouth See admin instructions. Take 1 tablet daily except 1 1/2 tablets on Mondays, Wednesdays and Fridays or as directed) 45  tablet 3   No current facility-administered medications on file prior to visit.    Social History   Socioeconomic History  . Marital status: Married    Spouse name: Not on file  . Number of children: Not on file  . Years of education: Not on file  . Highest education level: Not on file  Occupational History  . Not on file  Tobacco Use  . Smoking status: Current Every Day Smoker    Packs/day: 0.50    Years: 30.00    Pack years: 15.00    Types: Cigarettes  . Smokeless tobacco: Never Used  Substance and Sexual Activity  . Alcohol use: No    Alcohol/week: 0.0 standard drinks     Comment: hx of alcohol use   . Drug use: Not Currently    Types: Marijuana    Comment:  marijuana use  . Sexual activity: Yes    Partners: Female  Other Topics Concern  . Not on file  Social History Narrative  . Not on file   Social Determinants of Health   Financial Resource Strain:   . Difficulty of Paying Living Expenses:   Food Insecurity:   . Worried About Programme researcher, broadcasting/film/video in the Last Year:   . Barista in the Last Year:   Transportation Needs:   . Freight forwarder (Medical):   Marland Kitchen Lack of Transportation (Non-Medical):   Physical Activity:   . Days of Exercise per Week:   . Minutes of Exercise per Session:   Stress:   . Feeling of Stress :   Social Connections:   . Frequency of Communication with Friends and Family:   . Frequency of Social Gatherings with Friends and Family:   . Attends Religious Services:   . Active Member of Clubs or Organizations:   . Attends Banker Meetings:   Marland Kitchen Marital Status:   Intimate Partner Violence:   . Fear of Current or Ex-Partner:   . Emotionally Abused:   Marland Kitchen Physically Abused:   . Sexually Abused:     Family History  Problem Relation Age of Onset  . Hypertension Mother   . Diabetes Mother     BP (!) 157/93   Pulse 63   Ht 5\' 7"  (1.702 m)   Wt 173 lb (78.5 kg)   BMI 27.10 kg/m   Body mass index is 27.1 kg/m.      Objective:   Physical Exam Vitals and nursing note reviewed.  Constitutional:      Appearance: He is well-developed.  HENT:     Head: Normocephalic and atraumatic.  Eyes:     Conjunctiva/sclera: Conjunctivae normal.     Pupils: Pupils are equal, round, and reactive to light.  Cardiovascular:     Rate and Rhythm: Normal rate and regular rhythm.  Pulmonary:     Effort: Pulmonary effort is normal.  Abdominal:     Palpations: Abdomen is soft.  Musculoskeletal:       Arms:     Cervical back: Normal range of motion and neck supple.  Skin:    General: Skin is warm and dry.    Neurological:     Mental Status: He is alert and oriented to person, place, and time.     Cranial Nerves: No cranial nerve deficit.     Motor: No abnormal muscle tone.     Coordination: Coordination normal.     Deep Tendon Reflexes: Reflexes are normal and symmetric. Reflexes normal.  Psychiatric:        Behavior: Behavior normal.        Thought Content: Thought content normal.        Judgment: Judgment normal.    I have independently reviewed and interpreted x-rays of this patient done at another site by another physician or qualified health professional.     Encounter Diagnoses  Name Primary?  . Nicotine dependence, cigarettes, uncomplicated   . Chronic left shoulder pain Yes   PROCEDURE NOTE:  The patient request injection, verbal consent was obtained.  The left shoulder was prepped appropriately after time out was performed.   Sterile technique was observed and injection of 1 cc of Depo-Medrol 40 mg with several cc's of plain xylocaine. Anesthesia was provided by ethyl chloride and a 20-gauge needle was used to inject the shoulder area. A posterior approach was used.  The injection was tolerated well.  A band aid dressing was applied.  The patient was advised to apply ice later today and tomorrow to the injection sight as needed.  He declines oral medicine.  He may need MRI if not improved.  Return in three weeks.  Call if any problem.  Precautions discussed.   Electronically Signed Sanjuana Kava, MD 5/20/20218:40 AM

## 2020-05-23 ENCOUNTER — Other Ambulatory Visit: Payer: Self-pay | Admitting: Cardiology

## 2020-06-09 ENCOUNTER — Encounter: Payer: Self-pay | Admitting: Orthopaedic Surgery

## 2020-06-09 ENCOUNTER — Other Ambulatory Visit: Payer: Self-pay

## 2020-06-09 ENCOUNTER — Ambulatory Visit (INDEPENDENT_AMBULATORY_CARE_PROVIDER_SITE_OTHER): Payer: BC Managed Care – PPO | Admitting: Orthopaedic Surgery

## 2020-06-09 VITALS — BP 164/100 | HR 63 | Ht 67.0 in | Wt 173.0 lb

## 2020-06-09 DIAGNOSIS — M25512 Pain in left shoulder: Secondary | ICD-10-CM | POA: Diagnosis not present

## 2020-06-09 DIAGNOSIS — F17210 Nicotine dependence, cigarettes, uncomplicated: Secondary | ICD-10-CM

## 2020-06-09 DIAGNOSIS — G8929 Other chronic pain: Secondary | ICD-10-CM | POA: Diagnosis not present

## 2020-06-09 NOTE — Progress Notes (Signed)
My shoulder is better  His left shoulder has no pain, full ROM today.   The injection helped.  NV intact.  Encounter Diagnoses  Name Primary?  . Chronic left shoulder pain Yes  . Nicotine dependence, cigarettes, uncomplicated    I will see as needed.  Call if any problem.  Precautions discussed.   Electronically Signed Darreld Mclean, MD 6/10/20218:52 AM

## 2020-06-09 NOTE — Patient Instructions (Signed)
Steps to Quit Smoking Smoking tobacco is the leading cause of preventable death. It can affect almost every organ in the body. Smoking puts you and people around you at risk for many serious, long-lasting (chronic) diseases. Quitting smoking can be hard, but it is one of the best things that you can do for your health. It is never too late to quit. How do I get ready to quit? When you decide to quit smoking, make a plan to help you succeed. Before you quit:  Pick a date to quit. Set a date within the next 2 weeks to give you time to prepare.  Write down the reasons why you are quitting. Keep this list in places where you will see it often.  Tell your family, friends, and co-workers that you are quitting. Their support is important.  Talk with your doctor about the choices that may help you quit.  Find out if your health insurance will pay for these treatments.  Know the people, places, things, and activities that make you want to smoke (triggers). Avoid them. What first steps can I take to quit smoking?  Throw away all cigarettes at home, at work, and in your car.  Throw away the things that you use when you smoke, such as ashtrays and lighters.  Clean your car. Make sure to empty the ashtray.  Clean your home, including curtains and carpets. What can I do to help me quit smoking? Talk with your doctor about taking medicines and seeing a counselor at the same time. You are more likely to succeed when you do both.  If you are pregnant or breastfeeding, talk with your doctor about counseling or other ways to quit smoking. Do not take medicine to help you quit smoking unless your doctor tells you to do so. To quit smoking: Quit right away  Quit smoking totally, instead of slowly cutting back on how much you smoke over a period of time.  Go to counseling. You are more likely to quit if you go to counseling sessions regularly. Take medicine You may take medicines to help you quit. Some  medicines need a prescription, and some you can buy over-the-counter. Some medicines may contain a drug called nicotine to replace the nicotine in cigarettes. Medicines may:  Help you to stop having the desire to smoke (cravings).  Help to stop the problems that come when you stop smoking (withdrawal symptoms). Your doctor may ask you to use:  Nicotine patches, gum, or lozenges.  Nicotine inhalers or sprays.  Non-nicotine medicine that is taken by mouth. Find resources Find resources and other ways to help you quit smoking and remain smoke-free after you quit. These resources are most helpful when you use them often. They include:  Online chats with a counselor.  Phone quitlines.  Printed self-help materials.  Support groups or group counseling.  Text messaging programs.  Mobile phone apps. Use apps on your mobile phone or tablet that can help you stick to your quit plan. There are many free apps for mobile phones and tablets as well as websites. Examples include Quit Guide from the CDC and smokefree.gov  What things can I do to make it easier to quit?   Talk to your family and friends. Ask them to support and encourage you.  Call a phone quitline (1-800-QUIT-NOW), reach out to support groups, or work with a counselor.  Ask people who smoke to not smoke around you.  Avoid places that make you want to smoke,   such as: ? Bars. ? Parties. ? Smoke-break areas at work.  Spend time with people who do not smoke.  Lower the stress in your life. Stress can make you want to smoke. Try these things to help your stress: ? Getting regular exercise. ? Doing deep-breathing exercises. ? Doing yoga. ? Meditating. ? Doing a body scan. To do this, close your eyes, focus on one area of your body at a time from head to toe. Notice which parts of your body are tense. Try to relax the muscles in those areas. How will I feel when I quit smoking? Day 1 to 3 weeks Within the first 24 hours,  you may start to have some problems that come from quitting tobacco. These problems are very bad 2-3 days after you quit, but they do not often last for more than 2-3 weeks. You may get these symptoms:  Mood swings.  Feeling restless, nervous, angry, or annoyed.  Trouble concentrating.  Dizziness.  Strong desire for high-sugar foods and nicotine.  Weight gain.  Trouble pooping (constipation).  Feeling like you may vomit (nausea).  Coughing or a sore throat.  Changes in how the medicines that you take for other issues work in your body.  Depression.  Trouble sleeping (insomnia). Week 3 and afterward After the first 2-3 weeks of quitting, you may start to notice more positive results, such as:  Better sense of smell and taste.  Less coughing and sore throat.  Slower heart rate.  Lower blood pressure.  Clearer skin.  Better breathing.  Fewer sick days. Quitting smoking can be hard. Do not give up if you fail the first time. Some people need to try a few times before they succeed. Do your best to stick to your quit plan, and talk with your doctor if you have any questions or concerns. Summary  Smoking tobacco is the leading cause of preventable death. Quitting smoking can be hard, but it is one of the best things that you can do for your health.  When you decide to quit smoking, make a plan to help you succeed.  Quit smoking right away, not slowly over a period of time.  When you start quitting, seek help from your doctor, family, or friends. This information is not intended to replace advice given to you by your health care provider. Make sure you discuss any questions you have with your health care provider. Document Revised: 09/11/2019 Document Reviewed: 03/07/2019 Elsevier Patient Education  2020 [employer] Inc.  

## 2020-06-23 ENCOUNTER — Ambulatory Visit: Payer: BC Managed Care – PPO | Admitting: *Deleted

## 2020-06-23 ENCOUNTER — Other Ambulatory Visit: Payer: Self-pay

## 2020-06-23 DIAGNOSIS — Z5181 Encounter for therapeutic drug level monitoring: Secondary | ICD-10-CM

## 2020-06-23 DIAGNOSIS — I513 Intracardiac thrombosis, not elsewhere classified: Secondary | ICD-10-CM

## 2020-06-23 LAB — POCT INR: INR: 5.2 — AB (ref 2.0–3.0)

## 2020-06-23 NOTE — Patient Instructions (Signed)
Had steroid shot in Rt shoulder 5/20 Hold warfarin x 3 days (Th,Fri,Sat) then resume 1 tablet daily except 1 1/2 tablets on Mondays and Fridays Be consistent with greens Recheck in 2 weeks  Bleeding and fall precautions discussed with pt and he verbalized understanding

## 2020-06-28 ENCOUNTER — Other Ambulatory Visit: Payer: Self-pay | Admitting: Cardiology

## 2020-06-29 ENCOUNTER — Encounter (HOSPITAL_COMMUNITY): Payer: Self-pay | Admitting: Emergency Medicine

## 2020-06-29 ENCOUNTER — Other Ambulatory Visit: Payer: Self-pay

## 2020-06-29 ENCOUNTER — Emergency Department (HOSPITAL_COMMUNITY)
Admission: EM | Admit: 2020-06-29 | Discharge: 2020-06-29 | Disposition: A | Discharge status: Home / Self Care | Payer: BC Managed Care – PPO | Attending: Emergency Medicine | Admitting: Emergency Medicine

## 2020-06-29 ENCOUNTER — Emergency Department (HOSPITAL_COMMUNITY): Payer: BC Managed Care – PPO

## 2020-06-29 DIAGNOSIS — Z888 Allergy status to other drugs, medicaments and biological substances status: Secondary | ICD-10-CM | POA: Insufficient documentation

## 2020-06-29 DIAGNOSIS — J449 Chronic obstructive pulmonary disease, unspecified: Secondary | ICD-10-CM | POA: Diagnosis not present

## 2020-06-29 DIAGNOSIS — Z79899 Other long term (current) drug therapy: Secondary | ICD-10-CM | POA: Insufficient documentation

## 2020-06-29 DIAGNOSIS — M25551 Pain in right hip: Secondary | ICD-10-CM | POA: Diagnosis present

## 2020-06-29 DIAGNOSIS — I2510 Atherosclerotic heart disease of native coronary artery without angina pectoris: Secondary | ICD-10-CM | POA: Diagnosis not present

## 2020-06-29 DIAGNOSIS — I129 Hypertensive chronic kidney disease with stage 1 through stage 4 chronic kidney disease, or unspecified chronic kidney disease: Secondary | ICD-10-CM | POA: Insufficient documentation

## 2020-06-29 DIAGNOSIS — Z96643 Presence of artificial hip joint, bilateral: Secondary | ICD-10-CM | POA: Diagnosis not present

## 2020-06-29 DIAGNOSIS — F17210 Nicotine dependence, cigarettes, uncomplicated: Secondary | ICD-10-CM | POA: Diagnosis not present

## 2020-06-29 DIAGNOSIS — I119 Hypertensive heart disease without heart failure: Secondary | ICD-10-CM | POA: Insufficient documentation

## 2020-06-29 DIAGNOSIS — Z951 Presence of aortocoronary bypass graft: Secondary | ICD-10-CM | POA: Diagnosis not present

## 2020-06-29 DIAGNOSIS — N183 Chronic kidney disease, stage 3 unspecified: Secondary | ICD-10-CM | POA: Insufficient documentation

## 2020-06-29 MED ORDER — CYCLOBENZAPRINE HCL 10 MG PO TABS
10.0000 mg | ORAL_TABLET | Freq: Two times a day (BID) | ORAL | 0 refills | Status: DC | PRN
Start: 2020-06-29 — End: 2022-05-10

## 2020-06-29 MED ORDER — HYDROCODONE-ACETAMINOPHEN 5-325 MG PO TABS
2.0000 | ORAL_TABLET | Freq: Once | ORAL | Status: AC
  Administered 2020-06-29: 2 via ORAL
  Filled 2020-06-29: qty 2

## 2020-06-29 NOTE — ED Provider Notes (Signed)
[facility] EMERGENCY DEPARTMENT Provider Note   CSN: [id number] Arrival date & time: 06/29/20  0539     History Chief Complaint  Patient presents with  . Hip Pain    Steve Vang is a 54 y.o. male.  Patient with history of coronary artery disease, cardiomyopathy, high blood pressure, stroke, on Coumadin compliant presents with worsening right hip pain since Monday.  No injuries recalled however patient has had issues with both hips and replacement 5 years ago.  Patient normally has pain 1-2 scale however it was severe recently.  No cold leg appreciated no weakness or numbness.  No history of blood clot in his legs.        Past Medical History:  Diagnosis Date  . CAD (coronary artery disease)    Multivessel, LVEF 45-50%  . Cardiomyopathy (HCC)    LVEF 50-55% December 2016  . CKD (chronic kidney disease) stage 2, GFR 60-89 ml/min   . COPD (chronic obstructive pulmonary disease) (HCC)   . Depression   . Essential hypertension   . Falls   . Gallstone   . Gastroenteritis   . History of stroke 2004  . Hyperlipidemia   . Left ventricular mural thrombus    On Coumadin  . Stroke [facility])     Patient Active Problem List   Diagnosis Date Noted  . Avascular necrosis of bone of right hip (HCC) 07/20/2016  . 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 Avascular necrosis of bones of both hips (HCC) 06/08/2016  . Status post total replacement of left hip 06/08/2016  . AKI (acute kidney injury) (HCC) 05/15/2016  . Nausea and vomiting 05/15/2016  . Abdominal pain 05/15/2016  . Chest pain 05/15/2016  . Emesis   . Encounter for therapeutic drug monitoring 03/03/2014  . Long term (current) use of anticoagulants 04/16/2011  . GAIT IMBALANCE 02/02/2011  . ABNORMAL CV (STRESS) TEST 10/02/2010  . Mural thrombus of left ventricle (HCC) 09/08/2010  . OTHER SPEC FORMS CHRONIC ISCHEMIC HEART DISEASE 06/07/2010  . Mixed hyperlipidemia 03/02/2010  . TOBACCO ABUSE 03/02/2010    . Essential hypertension, benign 03/02/2010  . CORONARY ATHEROSCLEROSIS NATIVE CORONARY ARTERY 03/02/2010  . CVA 03/01/2010    Past Surgical History:  Procedure Laterality Date  . CORONARY ARTERY BYPASS GRAFT     DOR anterior ventricular restoration surgery 8/06, [facility] [place] to first diagonal, SVG to PLB, SVG to RVE branch of nondominant RCA  . EYE SURGERY    . TOTAL HIP ARTHROPLASTY Left 06/08/2016   Procedure: TOTAL HIP ARTHROPLASTY ANTERIOR APPROACH;  Surgeon: Kathryne Hitch, MD;  Location: [facility];  Service: Orthopedics;  Laterality: Left;  . TOTAL HIP ARTHROPLASTY Right 07/20/2016   Procedure: RIGHT TOTAL HIP ARTHROPLASTY ANTERIOR APPROACH;  Surgeon: Kathryne Hitch, MD;  Location: [facility];  Service: Orthopedics;  Laterality: Right;       Family History  Problem Relation Age of Onset  . Hypertension Mother   . Diabetes Mother     Social History   Tobacco Use  . Smoking status: Current Every Day Smoker    Packs/day: 0.50    Years: 30.00    Pack years: 15.00    Types: Cigarettes  . Smokeless tobacco: Never Used  Vaping Use  . Vaping Use: Never used  Substance Use Topics  . Alcohol use: No    Alcohol/week: 0.0 standard drinks    Comment: hx of alcohol use   . Drug use: Not Currently  Types: Marijuana    Comment:  marijuana use    Home Medications Prior to Admission medications   Medication Sig Start Date End Date Taking? Authorizing Provider  aspirin EC 81 MG tablet Take 81 mg by mouth daily.    [provider]  atorvastatin (LIPITOR) 40 MG tablet TAKE 1 TABLET BY MOUTH ONCE DAILY AT [facility] 06/28/20   Jonelle Sidle, MD  cyclobenzaprine (FLEXERIL) 10 MG tablet Take 1 tablet (10 mg total) by mouth 2 (two) times daily as needed for muscle spasms. 06/29/20   Blane Ohara, MD  HYDROcodone-acetaminophen (NORCO/VICODIN) 5-325 MG tablet One tablet every four hours for pain. 05/19/20   Darreld Mclean, MD  lisinopril (ZESTRIL) 10 MG  tablet Take 1 tablet (10 mg total) by mouth daily. 08/27/19   Jonelle Sidle, MD  metoprolol tartrate (LOPRESSOR) 25 MG tablet Take 1 tablet by mouth twice daily Patient taking differently: Take 25 mg by mouth 2 (two) times daily.  04/15/20   Jonelle Sidle, MD  nitroGLYCERIN (NITROSTAT) 0.4 MG SL tablet Place 1 tablet (0.4 mg total) under the tongue every 5 (five) minutes as needed for chest pain. 03/20/18   Jonelle Sidle, MD  warfarin (COUMADIN) 5 MG tablet Take 1-1.5 tablets (5-7.5 mg total) by mouth See admin instructions. Take 1 tablet daily except 1 1/2 tablets on Mondays and Fridays or as directed 05/24/20   Jonelle Sidle, MD    Allergies    Benadryl [diphenhydramine] and Chantix [varenicline]  Review of Systems   Review of Systems  Constitutional: Negative for chills and fever.  HENT: Negative for congestion.   Respiratory: Negative for shortness of breath.   Cardiovascular: Negative for chest pain.  Gastrointestinal: Negative for abdominal pain and vomiting.  Genitourinary: Negative for flank pain.  Musculoskeletal: Positive for gait problem. Negative for back pain, neck pain and neck stiffness.  Skin: Negative for rash.  Neurological: Negative for headaches.    Physical Exam Updated Vital Signs BP 124/85   Pulse 60   Temp 98 F (36.7 C)   Resp 18   Ht 5\' 7"  (1.702 m)   Wt 77.1 kg   SpO2 97%   BMI 26.63 kg/m   Physical Exam Vitals and nursing note reviewed.  Constitutional:      Appearance: He is well-developed.  HENT:     Head: Normocephalic and atraumatic.  Eyes:     General:        Right eye: No discharge.        Left eye: No discharge.     Conjunctiva/sclera: Conjunctivae normal.  Neck:     Trachea: No tracheal deviation.  Cardiovascular:     Rate and Rhythm: Normal rate.  Pulmonary:     Effort: Pulmonary effort is normal.  Abdominal:     General: There is no distension.  Musculoskeletal:        General: Tenderness present. No  swelling, deformity or signs of injury. Normal range of motion.     Cervical back: Normal range of motion and neck supple.     Comments: Patient has moderate tenderness and reproducibility with external rotation/flexion and walking on his right hip.  No warmth or external sign of rash, postoperative scar clean without sign of infection.  Normal strength right lower extremity with flexion extension of hip knee and ankle, normal cap refill distal, 2+ pulses distal in the right extremity.  Skin:    General: Skin is warm.     Findings: No rash.  Neurological:     Mental Status: He is alert and oriented to person, place, and time.     ED Results / Procedures / Treatments   Labs (all labs ordered are listed, but only abnormal results are displayed) Labs Reviewed - No data to display  EKG None  Radiology DG Hip Unilat W or Wo Pelvis 2-3 Views Right  Result Date: 06/29/2020 CLINICAL DATA:  Increased right hip pain since Monday. No trauma history EXAM: DG HIP (WITH OR WITHOUT PELVIS) 2-3V RIGHT COMPARISON:  07/20/2016 FINDINGS: Bilateral total hip arthroplasty. 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 Both prostheses appear well seated with no erosion or fracture. IMPRESSION: 1. No acute finding. 2. Bilateral total hip arthroplasty. 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 Electronically Signed   By: Marnee Spring M.D.   On: 06/29/2020 07:08    Procedures Procedures (including critical care time)  Medications Ordered in ED Medications  HYDROcodone-acetaminophen (NORCO/VICODIN) 5-325 MG per tablet 2 tablet (2 tablets Oral Given 06/29/20 0901)    ED Course  I have reviewed the triage vital signs and the nursing notes.  Pertinent labs & imaging results that were available during my care of the patient were reviewed by me and considered in my medical decision making (see chart for details).    MDM  Rules/Calculators/A&P                          Patient presents with clinically musculoskeletal discomfort in the right hip no signs of infection.  No evidence clinically of acute vascular event.  Discussed pain meds, supportive care, recheck by orthopedics and strict reasons to return.  X-ray reviewed no acute findings.   Final Clinical Impression(s) / ED Diagnoses Final diagnoses:  Right hip pain    Rx / DC Orders ED Discharge Orders         Ordered    cyclobenzaprine (FLEXERIL) 10 MG tablet  2 times daily PRN     Discontinue  Reprint     06/29/20 6546           Blane Ohara, MD 06/29/20 [phone]

## 2020-06-29 NOTE — Discharge Instructions (Addendum)
Follow-up with your orthopedic doctor as discussed.  Use Tylenol every 4 hours and ice as needed for pain.  You can try muscle relaxant as needed.  Return if you develop a cold foot or leg, fevers, weakness or new concerns.

## 2020-06-29 NOTE — ED Triage Notes (Signed)
Pt c/o increased right hip pain since Monday and denies any injury.

## 2020-07-06 ENCOUNTER — Telehealth: Payer: Self-pay | Admitting: Orthopaedic Surgery

## 2020-07-06 NOTE — Telephone Encounter (Signed)
Patient called (voice message) - call returned. Patient asked for his previous hip surgeon Dr Eliberto Ivory phone number - given as requested.

## 2020-07-07 ENCOUNTER — Other Ambulatory Visit: Payer: Self-pay

## 2020-07-07 ENCOUNTER — Ambulatory Visit (INDEPENDENT_AMBULATORY_CARE_PROVIDER_SITE_OTHER): Payer: BC Managed Care – PPO | Admitting: *Deleted

## 2020-07-07 DIAGNOSIS — I513 Intracardiac thrombosis, not elsewhere classified: Secondary | ICD-10-CM | POA: Diagnosis not present

## 2020-07-07 DIAGNOSIS — Z5181 Encounter for therapeutic drug level monitoring: Secondary | ICD-10-CM | POA: Diagnosis not present

## 2020-07-07 LAB — POCT INR: INR: 2.3 (ref 2.0–3.0)

## 2020-07-07 NOTE — Patient Instructions (Signed)
Continue warfarin 1 tablet daily except 1 1/2 tablets on Mondays and Fridays Be consistent with greens Recheck in 3 weeks

## 2020-07-14 ENCOUNTER — Other Ambulatory Visit: Payer: Self-pay

## 2020-07-14 ENCOUNTER — Ambulatory Visit (INDEPENDENT_AMBULATORY_CARE_PROVIDER_SITE_OTHER): Payer: BC Managed Care – PPO | Admitting: Physician Assistant

## 2020-07-14 ENCOUNTER — Encounter: Payer: Self-pay | Admitting: Physician Assistant

## 2020-07-14 DIAGNOSIS — M25551 Pain in right hip: Secondary | ICD-10-CM | POA: Diagnosis not present

## 2020-07-14 NOTE — Addendum Note (Signed)
Addended by: Mardene Celeste B on: 07/14/2020 04:01 PM   Modules accepted: Orders

## 2020-07-14 NOTE — Progress Notes (Signed)
Office Visit Note   Patient: Steve Vang           Date of Birth: Dec 09, 1966           MRN: [id number] Visit Date: 07/14/2020              Requested by: Steve Linsey, MD [address] [place],  [place] [address] PCP: Steve Linsey, MD   Assessment & Plan: Visit Diagnoses:  1. Pain in right hip     Plan: Due to patient's right hip pain notes been ongoing for years now becoming worse recommend MRI of the right hip rule out fracture versus psoas strain.  Also recommend three-phase bone scan to rule out loosening of the hardware.  Having follow-up with Steve Vang after the studies to go over results and discuss further treatment.  Follow-Up Instructions: Return for After MRI and 3 phase bone scan. .   Orders:  No orders of the defined types were placed in this encounter.  No orders of the defined types were placed in this encounter.     Procedures: No procedures performed   Clinical Data: No additional findings.   Subjective: Chief Complaint  Patient presents with   Right Hip - Pain    HPI Steve Vang is well-known to our department service has not been seen in some time.  Comes in today with right hip pain.  He underwent left total hip arthroplasty 6 /2017 and a right total hip arthroplasty 06/2016.  He states he has had pain in the right hip since undergoing the surgical procedure however the pain is becoming worse in his right groin area.  He denies any numbness tingling down the leg.  Denies any back pain.  He states pains became worse over the last 3 weeks.  He did go to the ER at [facility] and had radiographs of his pelvis and right hip.  I personally reviewed these.  They showed no acute fractures.  Heterotopic bone is seen bilaterally but no bridging.  Bilateral total hip arthroplasty components are all well seated no evidence of hardware failure.  Hips are well located.  He denies any fevers or chills.  He is now walking with a cane.  Has some slight  discomfort lateral aspect of the hand again most of his pain is deep in the groin and radiates down the mid right thigh.  Review of Systems See HPI otherwise negative or noncontributory.  Objective: Vital Signs: There were no vitals taken for this visit.  Physical Exam Constitutional:      Appearance: He is normal weight. He is not ill-appearing or diaphoretic.  Pulmonary:     Effort: Pulmonary effort is normal.  Neurological:     Mental Status: He is alert and oriented to person, place, and time.  Psychiatric:        Mood and Affect: Mood normal.     Ortho Exam Bilateral hips excellent range of motion without pain.  Flexion of both hips causes no discomfort.  He has very slight tenderness over the right trochanteric region. Negative straight leg raise bilaterally.  Tight hamstrings bilaterally.  Right hip surgical incision is well-healed.  No erythema ecchymosis or edema over the right proximal thigh. Specialty Comments:  No specialty comments available.  Imaging: No results found.   PMFS History: Patient Active Problem List   Diagnosis Date Noted   Avascular necrosis of bone of right hip (HCC) 07/20/2016   Status post total replacement of right  hip 07/20/2016   Avascular necrosis of bones of both hips (HCC) 06/08/2016   Status post total replacement of left hip 06/08/2016   AKI (acute kidney injury) (HCC) 05/15/2016   Nausea and vomiting 05/15/2016   Abdominal pain 05/15/2016   Chest pain 05/15/2016   Emesis    Encounter for therapeutic drug monitoring 03/03/2014   Long term (current) use of anticoagulants 04/16/2011   GAIT IMBALANCE 02/02/2011   ABNORMAL CV (STRESS) TEST 10/02/2010   Mural thrombus of left ventricle (HCC) 09/08/2010   OTHER SPEC FORMS CHRONIC ISCHEMIC HEART DISEASE 06/07/2010   Mixed hyperlipidemia 03/02/2010   TOBACCO ABUSE 03/02/2010   Essential hypertension, benign 03/02/2010   CORONARY ATHEROSCLEROSIS NATIVE CORONARY ARTERY  03/02/2010   CVA 03/01/2010   Past Medical History:  Diagnosis Date   CAD (coronary artery disease)    Multivessel, LVEF 45-50%   Cardiomyopathy (HCC)    LVEF 50-55% December 2016   CKD (chronic kidney disease) stage 2, GFR 60-89 ml/min    COPD (chronic obstructive pulmonary disease) (HCC)    Depression    Essential hypertension    Falls    Gallstone    Gastroenteritis    History of stroke 2004   Hyperlipidemia    Left ventricular mural thrombus    On Coumadin   Stroke (HCC)     Family History  Problem Relation Age of Onset   Hypertension Mother    Diabetes Mother     Past Surgical History:  Procedure Laterality Date   CORONARY ARTERY BYPASS GRAFT     DOR anterior ventricular restoration surgery 8/06, [facility] [place] to first diagonal, SVG to PLB, SVG to RVE branch of nondominant RCA   EYE SURGERY     TOTAL HIP ARTHROPLASTY Left 06/08/2016   Procedure: TOTAL HIP ARTHROPLASTY ANTERIOR APPROACH;  Surgeon: Steve Hitch, MD;  Location: [facility];  Service: Orthopedics;  Laterality: Left;   TOTAL HIP ARTHROPLASTY Right 07/20/2016   Procedure: RIGHT TOTAL HIP ARTHROPLASTY ANTERIOR APPROACH;  Surgeon: Steve Hitch, MD;  Location: [facility];  Service: Orthopedics;  Laterality: Right;   Social History   Occupational History   Not on file  Tobacco Use   Smoking status: Current Every Day Smoker    Packs/day: 0.50    Years: 30.00    Pack years: 15.00    Types: Cigarettes   Smokeless tobacco: Never Used  Building services engineer Use: Never used  Substance and Sexual Activity   Alcohol use: No    Alcohol/week: 0.0 standard drinks    Comment: hx of alcohol use    Drug use: Not Currently    Types: Marijuana    Comment:  marijuana use   Sexual activity: Yes    Partners: Female

## 2020-08-10 ENCOUNTER — Ambulatory Visit (HOSPITAL_COMMUNITY)
Admission: RE | Admit: 2020-08-10 | Discharge: 2020-08-10 | Disposition: A | Discharge status: Home / Self Care | Payer: Self-pay | Source: Ambulatory Visit | Attending: Physician Assistant | Admitting: Physician Assistant

## 2020-08-10 ENCOUNTER — Other Ambulatory Visit: Payer: Self-pay

## 2020-08-10 ENCOUNTER — Encounter (HOSPITAL_COMMUNITY)
Admission: RE | Admit: 2020-08-10 | Discharge: 2020-08-10 | Disposition: A | Discharge status: Home / Self Care | Payer: Self-pay | Source: Ambulatory Visit | Attending: Physician Assistant | Admitting: Physician Assistant

## 2020-08-10 ENCOUNTER — Ambulatory Visit (HOSPITAL_COMMUNITY)
Admission: RE | Admit: 2020-08-10 | Discharge: 2020-08-10 | Disposition: A | Discharge status: Home / Self Care | Payer: Medicaid Other | Source: Ambulatory Visit | Attending: Physician Assistant | Admitting: Physician Assistant

## 2020-08-10 DIAGNOSIS — M25551 Pain in right hip: Secondary | ICD-10-CM

## 2020-08-10 MED ORDER — TECHNETIUM TC 99M MEDRONATE IV KIT
22.0000 | PACK | Freq: Once | INTRAVENOUS | Status: AC
  Administered 2020-08-10: 22 via INTRAVENOUS

## 2020-08-15 ENCOUNTER — Ambulatory Visit (INDEPENDENT_AMBULATORY_CARE_PROVIDER_SITE_OTHER): Payer: Self-pay | Admitting: Physician Assistant

## 2020-08-15 ENCOUNTER — Other Ambulatory Visit: Payer: Self-pay

## 2020-08-15 ENCOUNTER — Encounter: Payer: Self-pay | Admitting: Physician Assistant

## 2020-08-15 DIAGNOSIS — M25551 Pain in right hip: Secondary | ICD-10-CM

## 2020-08-15 NOTE — Progress Notes (Signed)
HPI: Steve Vang returns today to go over the MRI of his right hip and the three-phase bone scan.  He continues to have pain in the right groin area that radiates midline.  Denies any back pain denies any numbness tingling.  Pain does occasionally go down into the right superior thigh. MRI right hip without contrast showed heterotopic ossification superior to the greater trochanter unchanged from 2018.  Otherwise no acute findings or hardware failure. Mild bilateral SI joint degeneration was noted worse on the left. Three-phase bone scan was negative for any abnormal uptake to suggest any loosening or infection of either hip.  Physical exam: Bilateral hips excellent range of motion without pain.  He has minimal tenderness over the right trochanteric region.  5-5 strength throughout lower extremities against resistance.  Negative straight leg raise bilaterally.  Exquisitely tight hamstrings right greater than left.  Impression: Right hip pain unknown etiology  Plan: Offered formal physical therapy to work on hamstring stretching and also strengthening of his back.  Patient defers.  Therefore he is given exercises to do on his own at home.  Follow-up with [place] in 6 weeks check his progress or lack.  Questions encouraged and answered

## 2020-08-31 ENCOUNTER — Other Ambulatory Visit: Payer: Self-pay | Admitting: Cardiology

## 2020-09-01 ENCOUNTER — Other Ambulatory Visit: Payer: Self-pay

## 2020-09-01 ENCOUNTER — Ambulatory Visit (INDEPENDENT_AMBULATORY_CARE_PROVIDER_SITE_OTHER): Payer: Self-pay | Admitting: Pharmacist

## 2020-09-01 DIAGNOSIS — I513 Intracardiac thrombosis, not elsewhere classified: Secondary | ICD-10-CM

## 2020-09-01 DIAGNOSIS — Z5181 Encounter for therapeutic drug level monitoring: Secondary | ICD-10-CM

## 2020-09-01 LAB — POCT INR: INR: 3.6 — AB (ref 2.0–3.0)

## 2020-09-01 NOTE — Patient Instructions (Addendum)
Description   Hold dose today and take 1 tablet (5mg ) tomorrow.  Then continue warfarin 1 tablet daily except 1 1/2 tablets on Mondays and Fridays Be consistent with greens Recheck in 2 weeks

## 2020-09-19 ENCOUNTER — Ambulatory Visit (INDEPENDENT_AMBULATORY_CARE_PROVIDER_SITE_OTHER): Payer: Self-pay | Admitting: *Deleted

## 2020-09-19 DIAGNOSIS — I513 Intracardiac thrombosis, not elsewhere classified: Secondary | ICD-10-CM

## 2020-09-19 DIAGNOSIS — Z5181 Encounter for therapeutic drug level monitoring: Secondary | ICD-10-CM

## 2020-09-19 LAB — POCT INR: INR: 3 (ref 2.0–3.0)

## 2020-09-19 NOTE — Patient Instructions (Signed)
Continue warfarin 1 tablet daily except 1 1/2 tablets on Mondays and Fridays Be consistent with greens Recheck in 4 weeks

## 2020-10-17 ENCOUNTER — Ambulatory Visit (INDEPENDENT_AMBULATORY_CARE_PROVIDER_SITE_OTHER): Payer: Self-pay | Admitting: *Deleted

## 2020-10-17 DIAGNOSIS — Z5181 Encounter for therapeutic drug level monitoring: Secondary | ICD-10-CM

## 2020-10-17 DIAGNOSIS — I513 Intracardiac thrombosis, not elsewhere classified: Secondary | ICD-10-CM

## 2020-10-17 LAB — POCT INR: INR: 2.1 (ref 2.0–3.0)

## 2020-10-17 NOTE — Patient Instructions (Signed)
Continue warfarin 1 tablet daily except 1 1/2 tablets on Mondays and Fridays Be consistent with greens Recheck in 6 weeks

## 2020-10-21 ENCOUNTER — Encounter: Payer: Self-pay | Admitting: Cardiology

## 2020-10-21 ENCOUNTER — Ambulatory Visit (INDEPENDENT_AMBULATORY_CARE_PROVIDER_SITE_OTHER): Payer: Self-pay | Admitting: Cardiology

## 2020-10-21 ENCOUNTER — Other Ambulatory Visit: Payer: Self-pay

## 2020-10-21 VITALS — BP 106/70 | HR 85 | Ht 67.0 in | Wt 170.4 lb

## 2020-10-21 DIAGNOSIS — E782 Mixed hyperlipidemia: Secondary | ICD-10-CM

## 2020-10-21 DIAGNOSIS — I25119 Atherosclerotic heart disease of native coronary artery with unspecified angina pectoris: Secondary | ICD-10-CM

## 2020-10-21 MED ORDER — SIMVASTATIN 40 MG PO TABS
40.0000 mg | ORAL_TABLET | Freq: Every day | ORAL | 3 refills | Status: DC
Start: 2020-10-21 — End: 2021-11-27

## 2020-10-21 NOTE — Patient Instructions (Signed)
Medication Instructions:  STOP Atorvastatin  START Simvastatin 40 mg daily at dinner   *If you need a refill on your cardiac medications before your next appointment, please call your pharmacy*   Lab Work: Fasting Lipids in 6 months JUST before next visit   If you have labs (blood work) drawn today and your tests are completely normal, you will receive your results only by: Marland Kitchen MyChart Message (if you have MyChart) OR . A paper copy in the mail If you have any lab test that is abnormal or we need to change your treatment, we will call you to review the results.   Testing/Procedures: None today   Follow-Up: At [facility], you and your health needs are our priority.  As part of our continuing mission to provide you with exceptional heart care, we have created designated Provider Care Teams.  These Care Teams include your primary Cardiologist (physician) and Advanced Practice Providers (APPs -  Physician Assistants and Nurse Practitioners) who all work together to provide you with the care you need, when you need it.  We recommend signing up for the patient portal called "MyChart".  Sign up information is provided on this After Visit Summary.  MyChart is used to connect with patients for Virtual Visits (Telemedicine).  Patients are able to view lab/test results, encounter notes, upcoming appointments, etc.  Non-urgent messages can be sent to your provider as well.   To learn more about what you can do with MyChart, go to [email or web address].    Your next appointment:   6 month(s)  The format for your next appointment:   In Person  Provider:   [name], MD   Other Instructions None     Thank you for choosing [facility] !

## 2020-10-21 NOTE — Progress Notes (Signed)
Cardiology Office Note  Date: 10/21/2020   ID: Steve Vang, DOB 11/12/1966, MRN [id number]  PCP:  Oval Linsey, MD  Cardiologist:  [name], MD Electrophysiologist:  None   Chief Complaint  Patient presents with  . Cardiac follow-up    History of Present Illness: Steve Vang is a 54 y.o. male last assessed via telehealth encounter in April.  He presents for a routine visit.  Reports no active angina symptoms or nitroglycerin use.  We went over his medications, he states that he has not been taking Lipitor, indicates that the cost was too much for him to afford.  He did tolerate simvastatin previously.  He is on Coumadin with follow-up in the anticoagulation clinic.  2.1.  He reports no spontaneous bleeding problems.   Past Medical History:  Diagnosis Date  . CAD (coronary artery disease)    Multivessel, LVEF 45-50%  . Cardiomyopathy (HCC)    LVEF 50-55% December 2016  . CKD (chronic kidney disease) stage 2, GFR 60-89 ml/min   . COPD (chronic obstructive pulmonary disease) (HCC)   . Depression   . Essential hypertension   . Falls   . Gallstone   . Gastroenteritis   . History of stroke 2004  . Hyperlipidemia   . Left ventricular mural thrombus    On Coumadin  . Stroke [facility])     Past Surgical History:  Procedure Laterality Date  . CORONARY ARTERY BYPASS GRAFT     DOR anterior ventricular restoration surgery 8/06, [facility] to first diagonal, SVG to PLB, SVG to RVE branch of nondominant RCA  . EYE SURGERY    . TOTAL HIP ARTHROPLASTY Left 06/08/2016   Procedure: TOTAL HIP ARTHROPLASTY ANTERIOR APPROACH;  Surgeon: Kathryne Hitch, MD;  Location: [facility];  Service: Orthopedics;  Laterality: Left;  . TOTAL HIP ARTHROPLASTY Right 07/20/2016   Procedure: RIGHT TOTAL HIP ARTHROPLASTY ANTERIOR APPROACH;  Surgeon: Kathryne Hitch, MD;  Location: [facility];  Service: Orthopedics;  Laterality: Right;    Current Outpatient Medications    Medication Sig Dispense Refill  . aspirin EC 81 MG tablet Take 81 mg by mouth daily.    . cyclobenzaprine (FLEXERIL) 10 MG tablet Take 1 tablet (10 mg total) by mouth 2 (two) times daily as needed for muscle spasms. 10 tablet 0  . HYDROcodone-acetaminophen (NORCO/VICODIN) 5-325 MG tablet One tablet every four hours for pain. 30 tablet 0  . lisinopril (ZESTRIL) 10 MG tablet Take 1 tablet by mouth once daily 90 tablet 2  . metoprolol tartrate (LOPRESSOR) 25 MG tablet Take 1 tablet by mouth twice daily (Patient taking differently: Take 25 mg by mouth 2 (two) times daily. ) 60 tablet 6  . nitroGLYCERIN (NITROSTAT) 0.4 MG SL tablet Place 1 tablet (0.4 mg total) under the tongue every 5 (five) minutes as needed for chest pain. 25 tablet 3  . warfarin (COUMADIN) 5 MG tablet Take 1-1.5 tablets (5-7.5 mg total) by mouth See admin instructions. Take 1 tablet daily except 1 1/2 tablets on Mondays and Fridays or as directed 45 tablet 6  . simvastatin (ZOCOR) 40 MG tablet Take 1 tablet (40 mg total) by mouth at bedtime. 90 tablet 3   No current facility-administered medications for this visit.   Allergies:  Benadryl [diphenhydramine] and Chantix [varenicline]   ROS: No palpitations or syncope.  Physical Exam: VS:  BP 106/70   Pulse 85   Ht 5\' 7"  (1.702 m)   Wt 170 lb 6.4  oz (77.3 kg)   SpO2 94%   BMI 26.69 kg/m , BMI Body mass index is 26.69 kg/m.  Wt Readings from Last 3 Encounters:  10/21/20 170 lb 6.4 oz (77.3 kg)  06/29/20 170 lb (77.1 kg)  06/09/20 173 lb (78.5 kg)    General: Patient appears comfortable at rest. HEENT: Conjunctiva and lids normal, wearing a mask. Neck: Supple, no elevated JVP or carotid bruits, no thyromegaly. Lungs: Clear to auscultation, nonlabored breathing at rest. Cardiac: Regular rate and rhythm, no S3 or significant systolic murmur. Extremities: No pitting edema, distal pulses 2+.  ECG:  An ECG dated 05/10/2020 was personally reviewed today and demonstrated:   Sinus rhythm with LVH and repolarization abnormalities, poor R wave progression anteriorly.  Recent Labwork: 05/10/2020: BUN 15; Creatinine, Ser 0.95; Hemoglobin 16.6; Platelets 210; Potassium 3.5; Sodium 135     Component Value Date/Time   CHOL 199 06/05/2019 0803   TRIG 154 (H) 06/05/2019 0803   HDL 39 (L) 06/05/2019 0803   CHOLHDL 5.1 (H) 06/05/2019 0803   VLDL 31 11/11/2018 0937   LDLCALC 131 (H) 06/05/2019 0803    Other Studies Reviewed Today:  Echocardiogram 12/28/2015: Study Conclusions  - Left ventricle: The cavity size was normal. There was mild concentric hypertrophy. Systolic function was normal. The estimated ejection fraction was in the range of 50% to 55%. Features are consistent with a pseudonormal left ventricular filling pattern, with concomitant abnormal relaxation and increased filling pressure (grade 2 diastolic dysfunction). Doppler parameters are consistent with high ventricular filling pressure. - Regional wall motion abnormality: Akinesis of the mid anteroseptal myocardium; mild hypokinesis of the apical septal myocardium; probable mild hypokinesis of the apical myocardium. - Aortic valve: Mildly to moderately calcified annulus. - Mitral valve: Calcified annulus. Mildly thickened leaflets . - Right ventricle: Systolic function was mildly reduced.  Impressions:  - While the apical endocardium was suboptimally visualized, there was no obvious thrombus seen.  Assessment and Plan:  1.  Multivessel CAD status post CABG.  We continue medical therapy and observation in the absence of angina symptoms.  Continue aspirin, lisinopril, Lopressor, and start on Zocor 40 mg daily.  2.  Mixed hyperlipidemia.  We will resume Zocor at 40 mg daily and if he remains consistent check FLP for next visit in 6 months.  3.  Ischemic cardiomyopathy with LV mural thrombus.  Also prior history of stroke.  We have continued Coumadin long-term, keep  follow-up in the anticoagulation clinic.  His last LVEF was 50 to 55%.  Medication Adjustments/Labs and Tests Ordered: Current medicines are reviewed at length with the patient today.  Concerns regarding medicines are outlined above.   Tests Ordered: Orders Placed This Encounter  Procedures  . Lipid Profile    Medication Changes: Meds ordered this encounter  Medications  . simvastatin (ZOCOR) 40 MG tablet    Sig: Take 1 tablet (40 mg total) by mouth at bedtime.    Dispense:  90 tablet    Refill:  3    Disposition:  Follow up 6 months in the [place] office.  Signed, Jonelle Sidle, MD, [facility] 10/21/2020 2:47 PM    [facility] at [facility] [address] [place], [place] [address] Phone: [phone]; Fax: [phone]

## 2020-11-28 ENCOUNTER — Other Ambulatory Visit: Payer: Self-pay

## 2020-11-28 ENCOUNTER — Ambulatory Visit (INDEPENDENT_AMBULATORY_CARE_PROVIDER_SITE_OTHER): Payer: Self-pay | Admitting: *Deleted

## 2020-11-28 DIAGNOSIS — I513 Intracardiac thrombosis, not elsewhere classified: Secondary | ICD-10-CM

## 2020-11-28 DIAGNOSIS — Z5181 Encounter for therapeutic drug level monitoring: Secondary | ICD-10-CM

## 2020-11-28 LAB — POCT INR: INR: 2.8 (ref 2.0–3.0)

## 2020-11-28 NOTE — Patient Instructions (Signed)
Continue warfarin 1 tablet daily except 1 1/2 tablets on Mondays and Fridays Be consistent with greens Recheck in 6 weeks  

## 2021-01-09 ENCOUNTER — Ambulatory Visit (INDEPENDENT_AMBULATORY_CARE_PROVIDER_SITE_OTHER): Payer: Self-pay | Admitting: *Deleted

## 2021-01-09 DIAGNOSIS — I513 Intracardiac thrombosis, not elsewhere classified: Secondary | ICD-10-CM

## 2021-01-09 DIAGNOSIS — Z5181 Encounter for therapeutic drug level monitoring: Secondary | ICD-10-CM

## 2021-01-09 LAB — POCT INR: INR: 1.7 — AB (ref 2.0–3.0)

## 2021-01-09 NOTE — Patient Instructions (Signed)
Take warfarin 2 tablets today and tomorrow then resume 1 tablet daily except 1 1/2 tablets on Mondays and Fridays Be consistent with greens Recheck in 6 weeks

## 2021-02-06 ENCOUNTER — Ambulatory Visit (INDEPENDENT_AMBULATORY_CARE_PROVIDER_SITE_OTHER): Payer: Self-pay | Admitting: *Deleted

## 2021-02-06 DIAGNOSIS — I513 Intracardiac thrombosis, not elsewhere classified: Secondary | ICD-10-CM

## 2021-02-06 DIAGNOSIS — Z5181 Encounter for therapeutic drug level monitoring: Secondary | ICD-10-CM

## 2021-02-06 LAB — POCT INR: INR: 1.6 — AB (ref 2.0–3.0)

## 2021-02-06 NOTE — Patient Instructions (Addendum)
Take warfarin 2 tablets today then increase dsose to 1 1/2 tablets daily except 1 tablet on Sundays and Thursdays Be consistent with greens Recheck in 2 weeks

## 2021-02-20 ENCOUNTER — Ambulatory Visit (INDEPENDENT_AMBULATORY_CARE_PROVIDER_SITE_OTHER): Payer: Self-pay | Admitting: *Deleted

## 2021-02-20 DIAGNOSIS — I513 Intracardiac thrombosis, not elsewhere classified: Secondary | ICD-10-CM

## 2021-02-20 DIAGNOSIS — Z5181 Encounter for therapeutic drug level monitoring: Secondary | ICD-10-CM

## 2021-02-20 LAB — POCT INR: INR: 3.8 — AB (ref 2.0–3.0)

## 2021-02-20 NOTE — Patient Instructions (Signed)
Hold warfarin tonight then decrease dose to 1 tablet daily except 1 1/2 tablets on Mondays, Wednesdays and Fridays Be consistent with greens Recheck in 3 weeks

## 2021-03-13 ENCOUNTER — Other Ambulatory Visit: Payer: Self-pay

## 2021-03-13 ENCOUNTER — Ambulatory Visit (INDEPENDENT_AMBULATORY_CARE_PROVIDER_SITE_OTHER): Payer: Self-pay | Admitting: *Deleted

## 2021-03-13 DIAGNOSIS — I513 Intracardiac thrombosis, not elsewhere classified: Secondary | ICD-10-CM

## 2021-03-13 DIAGNOSIS — Z5181 Encounter for therapeutic drug level monitoring: Secondary | ICD-10-CM

## 2021-03-13 LAB — POCT INR: INR: 2.6 (ref 2.0–3.0)

## 2021-03-13 NOTE — Patient Instructions (Signed)
Continue warfarin 1 tablet daily except 1 1/2 tablets on Mondays, Wednesdays and Fridays Be consistent with greens Recheck in 4 weeks  

## 2021-04-10 ENCOUNTER — Ambulatory Visit (INDEPENDENT_AMBULATORY_CARE_PROVIDER_SITE_OTHER): Payer: Self-pay | Admitting: *Deleted

## 2021-04-10 DIAGNOSIS — I513 Intracardiac thrombosis, not elsewhere classified: Secondary | ICD-10-CM

## 2021-04-10 DIAGNOSIS — Z5181 Encounter for therapeutic drug level monitoring: Secondary | ICD-10-CM

## 2021-04-10 LAB — POCT INR: INR: 2.6 (ref 2.0–3.0)

## 2021-04-10 NOTE — Patient Instructions (Signed)
Continue warfarin 1 tablet daily except 1 1/2 tablets on Mondays, Wednesdays and Fridays Be consistent with greens Recheck in 4 weeks

## 2021-04-14 ENCOUNTER — Other Ambulatory Visit: Payer: Self-pay | Admitting: Cardiology

## 2021-04-30 ENCOUNTER — Other Ambulatory Visit: Payer: Self-pay | Admitting: Cardiology

## 2021-05-09 ENCOUNTER — Ambulatory Visit (INDEPENDENT_AMBULATORY_CARE_PROVIDER_SITE_OTHER): Payer: Self-pay | Admitting: *Deleted

## 2021-05-09 DIAGNOSIS — Z5181 Encounter for therapeutic drug level monitoring: Secondary | ICD-10-CM

## 2021-05-09 DIAGNOSIS — I513 Intracardiac thrombosis, not elsewhere classified: Secondary | ICD-10-CM

## 2021-05-09 LAB — POCT INR: INR: 3 (ref 2.0–3.0)

## 2021-05-09 NOTE — Patient Instructions (Signed)
Continue warfarin 1 tablet daily except 1 1/2 tablets on Mondays, Wednesdays and Fridays Be consistent with greens Recheck in 6 weeks  

## 2021-05-29 ENCOUNTER — Other Ambulatory Visit: Payer: Self-pay | Admitting: Cardiology

## 2021-06-06 ENCOUNTER — Telehealth: Payer: Self-pay | Admitting: Cardiology

## 2021-06-06 MED ORDER — METOPROLOL TARTRATE 25 MG PO TABS: 25.0000 mg | ORAL_TABLET | Freq: Two times a day (BID) | ORAL | 1 refills | Status: DC

## 2021-06-06 NOTE — Telephone Encounter (Signed)
Medication refill request approved for Metoprolol Tartrate 25 mg tablets sent to [facility] per pt request. Pt also made overdue 6 month f/u appt with Dr. Diona Browner for October 04, 2021.

## 2021-06-06 NOTE — Telephone Encounter (Signed)
New message     *STAT* If patient is at the pharmacy, call can be transferred to refill team.   1. Which medications need to be refilled? (please list name of each medication and dose if known) metoprolol tartrate (LOPRESSOR) 25 MG tablet  2. Which pharmacy/location (including street and city if local pharmacy) is medication to be sent to? [facility] in [place]  3. Do they need a 30 day or 90 day supply? 90

## 2021-06-19 ENCOUNTER — Telehealth: Payer: Self-pay | Admitting: Cardiology

## 2021-06-19 MED ORDER — LISINOPRIL 10 MG PO TABS: 1.0000 | ORAL_TABLET | Freq: Every day | ORAL | 2 refills | Status: DC

## 2021-06-19 NOTE — Telephone Encounter (Signed)
Medication refill request approved and sent to the pharmacy per pt request. Pt made 79m f/u appt with [facility] 10/04/2021.

## 2021-06-19 NOTE — Telephone Encounter (Signed)
*  STAT* If patient is at the pharmacy, call can be transferred to refill team.   1. Which medications need to be refilled? (please list name of each medication and dose if known) lisinopril (ZESTRIL) 10 MG tablet  2. Which pharmacy/location (including street and city if local pharmacy) is medication to be sent to? [facility] in [place]  3. Do they need a 30 day or 90 day supply? 90 days

## 2021-06-20 ENCOUNTER — Ambulatory Visit (INDEPENDENT_AMBULATORY_CARE_PROVIDER_SITE_OTHER): Payer: Self-pay | Admitting: *Deleted

## 2021-06-20 ENCOUNTER — Other Ambulatory Visit: Payer: Self-pay

## 2021-06-20 DIAGNOSIS — I513 Intracardiac thrombosis, not elsewhere classified: Secondary | ICD-10-CM

## 2021-06-20 DIAGNOSIS — Z5181 Encounter for therapeutic drug level monitoring: Secondary | ICD-10-CM

## 2021-06-20 LAB — POCT INR: INR: 2.9 (ref 2.0–3.0)

## 2021-06-20 NOTE — Patient Instructions (Signed)
Continue warfarin 1 tablet daily except 1 1/2 tablets on Mondays, Wednesdays and Fridays Be consistent with greens Recheck in 6 weeks

## 2021-06-25 ENCOUNTER — Other Ambulatory Visit: Payer: Self-pay | Admitting: Cardiology

## 2021-08-07 ENCOUNTER — Ambulatory Visit (INDEPENDENT_AMBULATORY_CARE_PROVIDER_SITE_OTHER): Payer: Self-pay | Admitting: *Deleted

## 2021-08-07 DIAGNOSIS — Z5181 Encounter for therapeutic drug level monitoring: Secondary | ICD-10-CM

## 2021-08-07 DIAGNOSIS — I513 Intracardiac thrombosis, not elsewhere classified: Secondary | ICD-10-CM

## 2021-08-07 LAB — POCT INR: INR: 3.4 — AB (ref 2.0–3.0)

## 2021-08-07 NOTE — Patient Instructions (Signed)
Took warfarin this morning Hold warfarin tomorrow then resume 1 tablet daily except 1 1/2 tablets on Mondays, Wednesdays and Fridays Eat greens today Recheck in 6 weeks

## 2021-08-30 ENCOUNTER — Other Ambulatory Visit: Payer: Self-pay | Admitting: Cardiology

## 2021-09-18 ENCOUNTER — Other Ambulatory Visit: Payer: Self-pay

## 2021-09-18 ENCOUNTER — Ambulatory Visit (INDEPENDENT_AMBULATORY_CARE_PROVIDER_SITE_OTHER): Payer: Self-pay | Admitting: *Deleted

## 2021-09-18 DIAGNOSIS — Z5181 Encounter for therapeutic drug level monitoring: Secondary | ICD-10-CM

## 2021-09-18 DIAGNOSIS — I513 Intracardiac thrombosis, not elsewhere classified: Secondary | ICD-10-CM

## 2021-09-18 LAB — POCT INR: INR: 4.2 — AB (ref 2.0–3.0)

## 2021-09-18 NOTE — Patient Instructions (Signed)
Hold warfarin tomorrow then decrease dose to 1 tablet daily except 1 1/2 tablets on Mondays Eat greens today Recheck in 3 weeks

## 2021-10-03 NOTE — Progress Notes (Signed)
Cardiology Office Note  Date: 10/04/2021   ID: Steve Vang, DOB 31-Oct-1966, MRN [id number]  PCP:  Oneita Hurt, No  Cardiologist:  [name], MD Electrophysiologist:  None   Chief Complaint  Patient presents with   Cardiac follow-up    History of Present Illness: Steve Vang is a 55 y.o. male last seen in October 2021.  He presents for a routine visit.  Since last encounter he does not describe any active angina symptoms or nitroglycerin use.  Reports NYHA class II dyspnea, no palpitations or syncope.  He does not have a PCP at this time following the retirement of Dr. Janna Arch.  He remains on Coumadin with follow-up in anticoagulation clinic.  Reported spontaneous bleeding problems.  He did have COVID-19 back in July, was not hospitalized, had been vaccinated previously.  On a positive note, he was able to quit smoking since that time.  I reviewed his medications which are stable from a cardiac perspective and outlined below.  Past Medical History:  Diagnosis Date   CAD (coronary artery disease)    Multivessel, LVEF 45-50%   Cardiomyopathy (HCC)    LVEF 50-55% December 2016   CKD (chronic kidney disease) stage 2, GFR 60-89 ml/min    COPD (chronic obstructive pulmonary disease) (HCC)    Depression    Essential hypertension    Falls    Gallstone    Gastroenteritis    History of stroke 2004   Hyperlipidemia    Left ventricular mural thrombus    On Coumadin   Stroke [facility])     Past Surgical History:  Procedure Laterality Date   CORONARY ARTERY BYPASS GRAFT     DOR anterior ventricular restoration surgery 8/06, [facility] to first diagonal, SVG to PLB, SVG to RVE branch of nondominant RCA   EYE SURGERY     TOTAL HIP ARTHROPLASTY Left 06/08/2016   Procedure: TOTAL HIP ARTHROPLASTY ANTERIOR APPROACH;  Surgeon: Kathryne Hitch, MD;  Location: [facility];  Service: Orthopedics;  Laterality: Left;   TOTAL HIP ARTHROPLASTY Right 07/20/2016   Procedure: RIGHT  TOTAL HIP ARTHROPLASTY ANTERIOR APPROACH;  Surgeon: Kathryne Hitch, MD;  Location: [facility];  Service: Orthopedics;  Laterality: Right;    Current Outpatient Medications  Medication Sig Dispense Refill   aspirin EC 81 MG tablet Take 81 mg by mouth daily.     lisinopril (ZESTRIL) 10 MG tablet Take 1 tablet by mouth once daily 90 tablet 1   metoprolol tartrate (LOPRESSOR) 25 MG tablet Take 1 tablet (25 mg total) by mouth 2 (two) times daily. 180 tablet 1   simvastatin (ZOCOR) 40 MG tablet Take 1 tablet (40 mg total) by mouth at bedtime. 90 tablet 3   warfarin (COUMADIN) 5 MG tablet TAKE 1 TO 1 & 1/2 (ONE & ONE-HALF) TABLETS BY MOUTH ONCE DAILY AS DIRECTED BY  ANTICOAGULATION  CLINIC 45 tablet 5   cyclobenzaprine (FLEXERIL) 10 MG tablet Take 1 tablet (10 mg total) by mouth 2 (two) times daily as needed for muscle spasms. (Patient not taking: Reported on 10/04/2021) 10 tablet 0   HYDROcodone-acetaminophen (NORCO/VICODIN) 5-325 MG tablet One tablet every four hours for pain. (Patient not taking: Reported on 10/04/2021) 30 tablet 0   nitroGLYCERIN (NITROSTAT) 0.4 MG SL tablet Place 1 tablet (0.4 mg total) under the tongue every 5 (five) minutes as needed for chest pain. 25 tablet 3   No current facility-administered medications for this visit.   Allergies:  Benadryl [diphenhydramine] and  Chantix [varenicline]   ROS: No palpitations or syncope.  Physical Exam: VS:  BP (!) 144/86   Pulse 67   Ht 5\' 7"  (1.702 m)   Wt 174 lb (78.9 kg)   SpO2 95%   BMI 27.25 kg/m , BMI Body mass index is 27.25 kg/m.  Wt Readings from Last 3 Encounters:  10/04/21 174 lb (78.9 kg)  10/21/20 170 lb 6.4 oz (77.3 kg)  06/29/20 170 lb (77.1 kg)    General: Patient appears comfortable at rest. HEENT: Conjunctiva and lids normal, wearing a mask. Neck: Supple, no elevated JVP or carotid bruits, no thyromegaly. Lungs: Clear to auscultation, nonlabored breathing at rest. Cardiac: Regular rate and rhythm, no S3  or significant systolic murmur, no pericardial rub. Extremities: No pitting edema.  ECG:  An ECG dated 05/10/2020 was personally reviewed today and demonstrated:  Sinus rhythm with LVH and repolarization abnormalities, poor R wave progression anteriorly.  Recent Labwork:    Component Value Date/Time   CHOL 199 06/05/2019 0803   TRIG 154 (H) 06/05/2019 0803   HDL 39 (L) 06/05/2019 0803   CHOLHDL 5.1 (H) 06/05/2019 0803   VLDL 31 11/11/2018 0937   LDLCALC 131 (H) 06/05/2019 0803    Other Studies Reviewed Today:  Echocardiogram 12/28/2015: Study Conclusions   - Left ventricle: The cavity size was normal. There was mild   concentric hypertrophy. Systolic function was normal. The   estimated ejection fraction was in the range of 50% to 55%.   Features are consistent with a pseudonormal left ventricular   filling pattern, with concomitant abnormal relaxation and   increased filling pressure (grade 2 diastolic dysfunction).   Doppler parameters are consistent with high ventricular filling   pressure. - Regional wall motion abnormality: Akinesis of the mid   anteroseptal myocardium; mild hypokinesis of the apical septal   myocardium; probable mild hypokinesis of the apical myocardium. - Aortic valve: Mildly to moderately calcified annulus. - Mitral valve: Calcified annulus. Mildly thickened leaflets . - Right ventricle: Systolic function was mildly reduced.   Impressions:   - While the apical endocardium was suboptimally visualized, there   was no obvious thrombus seen.  Assessment and Plan:  1.  Multivessel CAD status post CABG.  He does not describe any active angina at this time on medical therapy.  I personally reviewed his ECG today which shows sinus rhythm with old anterolateral infarct pattern and repolarization abnormalities.  Continue aspirin, Lopressor, lisinopril, and Zocor.  He has as needed nitroglycerin available.  2.  Ischemic cardiomyopathy with history of LV mural  thrombus, on chronic Coumadin with prior history of stroke as well.  He continues to follow in the anticoagulation clinic.  Last LVEF was 50 to 55%, we will update his echocardiogram.  3.  Mixed hyperlipidemia, continues on Zocor, check FLP.  4.  Health maintenance, we will make referral to Dr. 12/30/2015 to establish with PCP.  Medication Adjustments/Labs and Tests Ordered: Current medicines are reviewed at length with the patient today.  Concerns regarding medicines are outlined above.   Tests Ordered: Orders Placed This Encounter  Procedures   Lipid panel   Ambulatory referral to Internal Medicine   EKG 12-Lead   ECHOCARDIOGRAM COMPLETE    Medication Changes: Meds ordered this encounter  Medications   nitroGLYCERIN (NITROSTAT) 0.4 MG SL tablet    Sig: Place 1 tablet (0.4 mg total) under the tongue every 5 (five) minutes as needed for chest pain.    Dispense:  25 tablet  Refill:  3     Disposition:  Follow up  6 months.  Signed, Jonelle Sidle, MD, [facility] 10/04/2021 3:49 PM    [facility] at [facility] [address] [place], [place] [address] Phone: [phone]; Fax: [phone]

## 2021-10-04 ENCOUNTER — Other Ambulatory Visit: Payer: Self-pay

## 2021-10-04 ENCOUNTER — Ambulatory Visit (INDEPENDENT_AMBULATORY_CARE_PROVIDER_SITE_OTHER): Payer: Self-pay | Admitting: Cardiology

## 2021-10-04 ENCOUNTER — Encounter: Payer: Self-pay | Admitting: Cardiology

## 2021-10-04 VITALS — BP 144/86 | HR 67 | Ht 67.0 in | Wt 174.0 lb

## 2021-10-04 DIAGNOSIS — I25119 Atherosclerotic heart disease of native coronary artery with unspecified angina pectoris: Secondary | ICD-10-CM

## 2021-10-04 DIAGNOSIS — Z0000 Encounter for general adult medical examination without abnormal findings: Secondary | ICD-10-CM

## 2021-10-04 DIAGNOSIS — E782 Mixed hyperlipidemia: Secondary | ICD-10-CM

## 2021-10-04 DIAGNOSIS — I255 Ischemic cardiomyopathy: Secondary | ICD-10-CM

## 2021-10-04 MED ORDER — NITROGLYCERIN 0.4 MG SL SUBL: 0.4000 mg | SUBLINGUAL_TABLET | SUBLINGUAL | 3 refills | Status: DC | PRN

## 2021-10-04 NOTE — Patient Instructions (Signed)
Medication Instructions:  Your physician recommends that you continue on your current medications as directed. Please refer to the Current Medication list given to you today.  *If you need a refill on your cardiac medications before your next appointment, please call your pharmacy*   Lab Work: FLP If you have labs (blood work) drawn today and your tests are completely normal, you will receive your results only by: MyChart Message (if you have MyChart) OR A paper copy in the mail If you have any lab test that is abnormal or we need to change your treatment, we will call you to review the results.   Testing/Procedures: Your physician has requested that you have an echocardiogram. Echocardiography is a painless test that uses sound waves to create images of your heart. It provides your doctor with information about the size and shape of your heart and how well your heart's chambers and valves are working. This procedure takes approximately one hour. There are no restrictions for this procedure.    Follow-Up: At [facility], you and your health needs are our priority.  As part of our continuing mission to provide you with exceptional heart care, we have created designated Provider Care Teams.  These Care Teams include your primary Cardiologist (physician) and Advanced Practice Providers (APPs -  Physician Assistants and Nurse Practitioners) who all work together to provide you with the care you need, when you need it.  We recommend signing up for the patient portal called "MyChart".  Sign up information is provided on this After Visit Summary.  MyChart is used to connect with patients for Virtual Visits (Telemedicine).  Patients are able to view lab/test results, encounter notes, upcoming appointments, etc.  Non-urgent messages can be sent to your provider as well.   To learn more about what you can do with MyChart, go to [email or web address].    Your next appointment:   6 month(s)  The  format for your next appointment:   In Person  Provider:   [name], MD   Other Instructions

## 2021-10-09 ENCOUNTER — Ambulatory Visit (INDEPENDENT_AMBULATORY_CARE_PROVIDER_SITE_OTHER): Payer: Self-pay | Admitting: *Deleted

## 2021-10-09 DIAGNOSIS — I513 Intracardiac thrombosis, not elsewhere classified: Secondary | ICD-10-CM

## 2021-10-09 DIAGNOSIS — Z5181 Encounter for therapeutic drug level monitoring: Secondary | ICD-10-CM

## 2021-10-09 LAB — POCT INR: INR: 3.3 — AB (ref 2.0–3.0)

## 2021-10-09 NOTE — Patient Instructions (Signed)
Take warfarin 1/2 tablet tonight then decrease dose to 1 tablet daily Continue greens Recheck in 4 weeks

## 2021-11-06 ENCOUNTER — Other Ambulatory Visit: Payer: Self-pay

## 2021-11-06 ENCOUNTER — Ambulatory Visit (HOSPITAL_COMMUNITY)
Admission: RE | Admit: 2021-11-06 | Discharge: 2021-11-06 | Disposition: A | Discharge status: Home / Self Care | Payer: Self-pay | Source: Ambulatory Visit | Attending: Cardiology | Admitting: Cardiology

## 2021-11-06 ENCOUNTER — Ambulatory Visit (INDEPENDENT_AMBULATORY_CARE_PROVIDER_SITE_OTHER): Payer: Self-pay | Admitting: *Deleted

## 2021-11-06 DIAGNOSIS — I513 Intracardiac thrombosis, not elsewhere classified: Secondary | ICD-10-CM

## 2021-11-06 DIAGNOSIS — I255 Ischemic cardiomyopathy: Secondary | ICD-10-CM | POA: Insufficient documentation

## 2021-11-06 DIAGNOSIS — Z5181 Encounter for therapeutic drug level monitoring: Secondary | ICD-10-CM

## 2021-11-06 LAB — POCT INR: INR: 2.4 (ref 2.0–3.0)

## 2021-11-06 LAB — ECHOCARDIOGRAM COMPLETE
Area-P 1/2: 5.54 cm2
S' Lateral: 4.05 cm

## 2021-11-06 NOTE — Patient Instructions (Signed)
Continue warfarin 1 tablet daily Continue greens Recheck in 4 weeks 

## 2021-11-06 NOTE — Progress Notes (Signed)
*  PRELIMINARY RESULTS* Echocardiogram 2D Echocardiogram has been performed. Patient refused Definity at this time but would be willing to come back if necessary.  Stacey Drain 11/06/2021, 9:15 AM

## 2021-11-25 ENCOUNTER — Other Ambulatory Visit: Payer: Self-pay | Admitting: Cardiology

## 2021-12-04 ENCOUNTER — Ambulatory Visit (INDEPENDENT_AMBULATORY_CARE_PROVIDER_SITE_OTHER): Payer: Self-pay | Admitting: *Deleted

## 2021-12-04 ENCOUNTER — Other Ambulatory Visit: Payer: Self-pay

## 2021-12-04 DIAGNOSIS — Z5181 Encounter for therapeutic drug level monitoring: Secondary | ICD-10-CM

## 2021-12-04 DIAGNOSIS — I513 Intracardiac thrombosis, not elsewhere classified: Secondary | ICD-10-CM

## 2021-12-04 LAB — POCT INR: INR: 2.1 (ref 2.0–3.0)

## 2021-12-04 NOTE — Patient Instructions (Signed)
Continue warfarin 1 tablet daily Continue greens Recheck in 5 weeks 

## 2021-12-12 ENCOUNTER — Other Ambulatory Visit: Payer: Self-pay | Admitting: Cardiology

## 2022-01-08 ENCOUNTER — Ambulatory Visit (INDEPENDENT_AMBULATORY_CARE_PROVIDER_SITE_OTHER): Payer: Self-pay | Admitting: *Deleted

## 2022-01-08 DIAGNOSIS — Z5181 Encounter for therapeutic drug level monitoring: Secondary | ICD-10-CM

## 2022-01-08 DIAGNOSIS — I513 Intracardiac thrombosis, not elsewhere classified: Secondary | ICD-10-CM

## 2022-01-08 LAB — POCT INR: INR: 2.8 (ref 2.0–3.0)

## 2022-01-08 NOTE — Patient Instructions (Signed)
Continue warfarin 1 tablet daily Continue greens Recheck in 6 weeks 

## 2022-02-07 ENCOUNTER — Ambulatory Visit (INDEPENDENT_AMBULATORY_CARE_PROVIDER_SITE_OTHER): Payer: Self-pay | Admitting: *Deleted

## 2022-02-07 DIAGNOSIS — I513 Intracardiac thrombosis, not elsewhere classified: Secondary | ICD-10-CM

## 2022-02-07 DIAGNOSIS — Z5181 Encounter for therapeutic drug level monitoring: Secondary | ICD-10-CM

## 2022-02-07 LAB — POCT INR: INR: 3.3 — AB (ref 2.0–3.0)

## 2022-02-07 NOTE — Patient Instructions (Signed)
Hold warfarin tonight then resume 1 tablet daily  Continue greens Recheck in 6 weeks

## 2022-02-26 ENCOUNTER — Other Ambulatory Visit: Payer: Self-pay | Admitting: Cardiology

## 2022-03-22 ENCOUNTER — Ambulatory Visit (INDEPENDENT_AMBULATORY_CARE_PROVIDER_SITE_OTHER): Payer: Self-pay | Admitting: *Deleted

## 2022-03-22 DIAGNOSIS — Z5181 Encounter for therapeutic drug level monitoring: Secondary | ICD-10-CM

## 2022-03-22 DIAGNOSIS — I513 Intracardiac thrombosis, not elsewhere classified: Secondary | ICD-10-CM

## 2022-03-22 LAB — POCT INR: INR: 3 (ref 2.0–3.0)

## 2022-03-22 NOTE — Patient Instructions (Signed)
Description   ?Continue to take warfarin 1 tablet daily.  ?Continue greens ?Recheck in 6 weeks  ? ? ?  ? ? ?

## 2022-05-02 ENCOUNTER — Ambulatory Visit (INDEPENDENT_AMBULATORY_CARE_PROVIDER_SITE_OTHER): Payer: Self-pay | Admitting: *Deleted

## 2022-05-02 DIAGNOSIS — Z5181 Encounter for therapeutic drug level monitoring: Secondary | ICD-10-CM

## 2022-05-02 DIAGNOSIS — I513 Intracardiac thrombosis, not elsewhere classified: Secondary | ICD-10-CM

## 2022-05-02 LAB — POCT INR: INR: 3.1 — AB (ref 2.0–3.0)

## 2022-05-02 NOTE — Patient Instructions (Signed)
Description   ? Start taking warfarin 1 tablet daily except for 1/2 a tablet on Wednesdays. Be consistent with your greens.  ? ? ?  ? ? ?

## 2022-05-10 ENCOUNTER — Encounter: Payer: Self-pay | Admitting: Internal Medicine

## 2022-05-10 ENCOUNTER — Ambulatory Visit (INDEPENDENT_AMBULATORY_CARE_PROVIDER_SITE_OTHER): Payer: Self-pay | Admitting: Internal Medicine

## 2022-05-10 VITALS — BP 138/84 | HR 63 | Resp 18 | Ht 67.0 in | Wt 164.2 lb

## 2022-05-10 DIAGNOSIS — I2510 Atherosclerotic heart disease of native coronary artery without angina pectoris: Secondary | ICD-10-CM

## 2022-05-10 DIAGNOSIS — Z720 Tobacco use: Secondary | ICD-10-CM

## 2022-05-10 DIAGNOSIS — Z8673 Personal history of transient ischemic attack (TIA), and cerebral infarction without residual deficits: Secondary | ICD-10-CM

## 2022-05-10 DIAGNOSIS — I10 Essential (primary) hypertension: Secondary | ICD-10-CM

## 2022-05-10 DIAGNOSIS — G250 Essential tremor: Secondary | ICD-10-CM

## 2022-05-10 DIAGNOSIS — R7303 Prediabetes: Secondary | ICD-10-CM

## 2022-05-10 DIAGNOSIS — I513 Intracardiac thrombosis, not elsewhere classified: Secondary | ICD-10-CM

## 2022-05-10 DIAGNOSIS — I25119 Atherosclerotic heart disease of native coronary artery with unspecified angina pectoris: Secondary | ICD-10-CM

## 2022-05-10 DIAGNOSIS — I255 Ischemic cardiomyopathy: Secondary | ICD-10-CM

## 2022-05-10 NOTE — Assessment & Plan Note (Signed)
On B-blocker and ACEi ?Appears euvolemic currently ?

## 2022-05-10 NOTE — Assessment & Plan Note (Addendum)
Remote history, no residual deficit ?On aspirin and statin ?

## 2022-05-10 NOTE — Assessment & Plan Note (Signed)
On Coumadin ?Followed by Cardiology ?

## 2022-05-10 NOTE — Assessment & Plan Note (Signed)
S/p CABG ?On Aspirin, B-blocker and statin ?Followed by Cardiology ?

## 2022-05-10 NOTE — Assessment & Plan Note (Signed)
His hand and head movements are likely essential tremors, worse with anxiety/nervousness ?Advised to cut down smoking and use of marijuana ?Already on beta-blocker for CAD ?Would prefer to get neurology eval, but he prefers to wait till he gets insurance coverage ?

## 2022-05-10 NOTE — Assessment & Plan Note (Signed)
BP Readings from Last 1 Encounters:  ?05/10/22 138/84  ? ?Well-controlled with Lisinopril and Metoprolol ?Counseled for compliance with the medications ?Advised DASH diet and moderate exercise/walking, at least 150 mins/week ? ?

## 2022-05-10 NOTE — Progress Notes (Signed)
? ?New Patient Office Visit ? ?Subjective:  ?Patient ID: Steve Vang, male    DOB: 05-Dec-1966  Age: 56 y.o. MRN: [id number] ? ?CC:  ?Chief Complaint  ?Patient presents with  ? New Patient (Initial Visit)  ?  New patient was seeing dr Cindie Laroche pt has shakes constantly for about year head and hands shake   ? ? ?HPI ?Steve Vang is a 56 y.o. 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 ? ?CAD, ischemic CM and HTN: BP is well-controlled. Takes medications regularly. Patient denies headache, dizziness, chest pain, dyspnea or palpitations. ? ?He takes Coumadin for history of mural thrombus of LV.  He follows up with [facility] clinic. ? ?He has remote history of CVA without any residual defects.  He is on aspirin and statin currently.  Denies any numbness or tingling of the UE or LE.  Of note, he has been having shaking of his hands and head for the last 1 year.  It appears that he has worsening of shaking when he is anxious/nervous.  Does not have resting tremor currently.  He smokes cigars and uses marijuana.  Denies any history of seizures.  Denies any coffee or soda intake. ? ?He has had bilateral THA for history of avascular necrosis of the hips.  He has chronic hip and back pain, but denies any numbness or tingling of the LE, saddle anesthesia, urinary or stool incontinence. ? ? ? ?Past Medical History:  ?Diagnosis Date  ? CAD (coronary artery disease)   ? Multivessel, LVEF 45-50%  ? Cardiomyopathy ([place])   ? LVEF 50-55% December 2016  ? CKD (chronic kidney disease) stage 2, GFR 60-89 ml/min   ? COPD (chronic obstructive pulmonary disease) ([place])   ? Depression   ? Essential hypertension   ? Falls   ? Gallstone   ? Gastroenteritis   ? History of stroke 2004  ? Hyperlipidemia   ? Left ventricular mural thrombus   ? On Coumadin  ? Stroke [facility])   ? ? ?Past  Surgical History:  ?Procedure Laterality Date  ? CORONARY ARTERY BYPASS GRAFT    ? DOR anterior ventricular restoration surgery 8/06, [facility] - LIMA to first diagonal, SVG to PLB, SVG to RVE branch of nondominant RCA  ? EYE SURGERY    ? TOTAL HIP ARTHROPLASTY Left 06/08/2016  ? Procedure: TOTAL HIP ARTHROPLASTY ANTERIOR APPROACH;  Surgeon: Mcarthur Rossetti, MD;  Location: [facility];  Service: Orthopedics;  Laterality: Left;  ? TOTAL HIP ARTHROPLASTY Right 07/20/2016  ? Procedure: RIGHT TOTAL HIP ARTHROPLASTY ANTERIOR APPROACH;  Surgeon: Mcarthur Rossetti, MD;  Location: [facility];  Service: Orthopedics;  Laterality: Right;  ? ? ?Family History  ?Problem Relation Age of Onset  ? Hypertension Mother   ? Diabetes Mother   ? ? ?Social History  ? ?Socioeconomic History  ? Marital status: Married  ?  Spouse name: Not on file  ? Number of children: Not on file  ? Years of education: Not on file  ? Highest education level: Not on file  ?Occupational History  ? Not on file  ?Tobacco Use  ? Smoking status: Every Day  ?  Packs/day: 1.00  ?  Years: 30.00  ?  Pack years: 30.00  ?  Types: Cigarettes  ? Smokeless tobacco: Never  ?Vaping Use  ? Vaping Use: Never used  ?  Substance and Sexual Activity  ? Alcohol use: No  ?  Alcohol/week: 0.0 standard drinks  ?  Comment: hx of alcohol use   ? Drug use: Not Currently  ?  Types: Marijuana  ?  Comment:  marijuana use  ? Sexual activity: Yes  ?  Partners: Female  ?Other Topics Concern  ? Not on file  ?Social History Narrative  ? Not on file  ? ?Social Determinants of Health  ? ?Financial Resource Strain: Not on file  ?Food Insecurity: Not on file  ?Transportation Needs: Not on file  ?Physical Activity: Not on file  ?Stress: Not on file  ?Social Connections: Not on file  ?Intimate Partner Violence: Not on file  ? ? ?ROS ?Review of Systems  ?Constitutional:  Negative for chills and fever.  ?HENT:  Negative for congestion and sore throat.   ?Eyes:  Negative for pain and discharge.   ?Respiratory:  Negative for cough and shortness of breath.   ?Cardiovascular:  Negative for chest pain and palpitations.  ?Gastrointestinal:  Negative for diarrhea, nausea and vomiting.  ?Endocrine: Negative for polydipsia and polyuria.  ?Genitourinary:  Negative for dysuria and hematuria.  ?Musculoskeletal:  Positive for arthralgias and back pain. Negative for neck pain and neck stiffness.  ?Skin:  Negative for rash.  ?Neurological:  Positive for tremors. Negative for dizziness, weakness, numbness and headaches.  ?Psychiatric/Behavioral:  Negative for agitation and behavioral problems.   ? ?Objective:  ? ?Today's Vitals: BP 138/84 (BP Location: Left Arm, Patient Position: Sitting, Cuff Size: Normal)   Pulse 63   Resp 18   Ht 5' 7"  (1.702 m)   Wt 164 lb 3.2 oz (74.5 kg)   SpO2 95%   BMI 25.72 kg/m?  ? ?Physical Exam ?Vitals reviewed.  ?Constitutional:   ?   General: He is not in acute distress. ?   Appearance: He is not diaphoretic.  ?HENT:  ?   Head: Normocephalic and atraumatic.  ?   Nose: Nose normal.  ?   Mouth/Throat:  ?   Mouth: Mucous membranes are moist.  ?Eyes:  ?   General: No scleral icterus. ?   Extraocular Movements: Extraocular movements intact.  ?Cardiovascular:  ?   Rate and Rhythm: Normal rate and regular rhythm.  ?   Pulses: Normal pulses.  ?   Heart sounds: Normal heart sounds. No murmur heard. ?Pulmonary:  ?   Breath sounds: Normal breath sounds. No wheezing or rales.  ?Musculoskeletal:  ?   Cervical back: Neck supple. No tenderness.  ?   Right lower leg: No edema.  ?   Left lower leg: No edema.  ?Skin: ?   General: Skin is warm.  ?   Findings: No rash.  ?Neurological:  ?   General: No focal deficit present.  ?   Mental Status: He is alert and oriented to person, place, and time.  ?   Sensory: No sensory deficit.  ?   Motor: Tremor (B/l hands and head) present. No weakness.  ?Psychiatric:     ?   Mood and Affect: Mood normal.     ?   Behavior: Behavior normal.  ? ? ?Assessment & Plan:   ? ?Problem List Items Addressed This Visit   ? ?  ? Cardiovascular and Mediastinum  ? Essential hypertension, benign  ?  BP Readings from Last 1 Encounters:  ?05/10/22 138/84  ?Well-controlled with Lisinopril and Metoprolol ?Counseled for compliance with the medications ?Advised DASH diet and moderate exercise/walking, at least 150  mins/week ? ?  ?  ? Relevant Orders  ? CBC  ? Lipid panel  ? Coronary artery disease involving native coronary artery of native heart with angina pectoris ([place]) - Primary  ?  S/p CABG ?On Aspirin, B-blocker and statin ?Followed by Cardiology ? ?  ?  ? Relevant Orders  ? CBC  ? Lipid panel  ? CMP14+EGFR  ? Mural thrombus of left ventricle (HCC)  ?  On Coumadin ?Followed by Cardiology ? ?  ?  ? Ischemic cardiomyopathy  ?  On B-blocker and ACEi ?Appears euvolemic currently ? ?  ?  ?  ? Nervous and Auditory  ? Essential tremor  ?  His hand and head movements are likely essential tremors, worse with anxiety/nervousness ?Advised to cut down smoking and use of marijuana ?Already on beta-blocker for CAD ?Would prefer to get neurology eval, but he prefers to wait till he gets insurance coverage ? ?  ?  ?  ? Other  ? Tobacco abuse  ?  Smokes cigars currently ? ?Asked about quitting: confirms that he/she currently smokes cigarettes ?Advise to quit smoking: Educated about QUITTING to reduce the risk of cancer, cardio and cerebrovascular disease. ?Assess willingness: Unwilling to quit at this time, but is working on cutting back. ?Assist with counseling and pharmacotherapy: Counseled for 5 minutes and literature provided. ?Arrange for follow up: follow up in 3 months and continue to offer help. ?  ?  ? History of CVA (cerebrovascular accident)  ?  Remote history, no residual deficit ?On aspirin and statin ? ?  ?  ? ?Other Visit Diagnoses   ? ? Prediabetes      ? Relevant Orders  ? Hemoglobin A1c  ? ?  ? ? ?Outpatient Encounter Medications as of 05/10/2022  ?Medication Sig  ? aspirin EC 81 MG tablet  Take 81 mg by mouth daily.  ? lisinopril (ZESTRIL) 10 MG tablet Take 1 tablet by mouth once daily  ? metoprolol tartrate (LOPRESSOR) 25 MG tablet Take 1 tablet by mouth twice daily  ? nitroGLYCERIN (NITROSTAT) 0.4 MG SL

## 2022-05-10 NOTE — Assessment & Plan Note (Addendum)
Smokes cigars currently ? ?Asked about quitting: confirms that he/she currently smokes cigarettes ?Advise to quit smoking: Educated about QUITTING to reduce the risk of cancer, cardio and cerebrovascular disease. ?Assess willingness: Unwilling to quit at this time, but is working on cutting back. ?Assist with counseling and pharmacotherapy: Counseled for 5 minutes and literature provided. ?Arrange for follow up: follow up in 3 months and continue to offer help. ?

## 2022-05-10 NOTE — Patient Instructions (Signed)
Please continue taking medications as prescribed. ? ?Please cut down -> quit smoking. ? ?Please follow low salt diet and perform moderate exercise/walking at least 150 mins/week. ? ?Please get fasting blood tests done before the next visit. ?

## 2022-05-14 NOTE — Progress Notes (Signed)
? ? ?Cardiology Office Note ? ?Date: 05/15/2022  ? ?ID: [name], DOB Oct 30, 1966, MRN [id number] ? ?PCP:  Lindell Spar, MD  ?Cardiologist:  Rozann Lesches, MD ?Electrophysiologist:  None  ? ?Chief Complaint  ?Patient presents with  ? Cardiac follow-up  ? ? ?History of Present Illness: ?Steve Vang is a 56 y.o. male last seen in October 2022.  He is here today with his wife for a follow-up visit.  They have been under a lot of stress, he is brother is living with them and has a diagnosis of glioblastoma, has been declining steadily. ? ?He does not report any angina symptoms or nitroglycerin use, states that he has been taking his medications regularly. ? ?He is on Coumadin with follow-up in the anticoagulation clinic.  Recent INR 3.1.  No spontaneous bleeding problems. ? ?Follow-up echocardiogram in November 2022 revealed LVEF 45 to 50% range. ? ?He has lab work ordered per PCP, but still pending. ? ?Past Medical History:  ?Diagnosis Date  ? CAD (coronary artery disease)   ? Multivessel, LVEF 45-50%  ? Cardiomyopathy ([place])   ? LVEF 50-55% December 2016  ? CKD (chronic kidney disease) stage 2, GFR 60-89 ml/min   ? COPD (chronic obstructive pulmonary disease) ([place])   ? Depression   ? Essential hypertension   ? Falls   ? Gallstone   ? Gastroenteritis   ? History of stroke 2004  ? Hyperlipidemia   ? Left ventricular mural thrombus   ? On Coumadin  ? Stroke [facility])   ? ? ?Past Surgical History:  ?Procedure Laterality Date  ? CORONARY ARTERY BYPASS GRAFT    ? DOR anterior ventricular restoration surgery 8/06, [facility] - [place] to first diagonal, SVG to PLB, SVG to RVE branch of nondominant RCA  ? EYE SURGERY    ? TOTAL HIP ARTHROPLASTY Left 06/08/2016  ? Procedure: TOTAL HIP ARTHROPLASTY ANTERIOR APPROACH;  Surgeon: Mcarthur Rossetti, MD;  [facility]: [facility];  Service: Orthopedics;  Laterality: Left;  ? TOTAL HIP ARTHROPLASTY Right 07/20/2016  ? Procedure: RIGHT TOTAL HIP ARTHROPLASTY ANTERIOR APPROACH;   Surgeon: Mcarthur Rossetti, MD;  [facility]: [facility];  Service: Orthopedics;  Laterality: Right;  ? ? ?Current Outpatient Medications  ?Medication Sig Dispense Refill  ? aspirin EC 81 MG tablet Take 81 mg by mouth daily.    ? lisinopril (ZESTRIL) 10 MG tablet Take 1 tablet by mouth once daily 90 tablet 3  ? metoprolol tartrate (LOPRESSOR) 25 MG tablet Take 1 tablet by mouth twice daily 180 tablet 1  ? nitroGLYCERIN (NITROSTAT) 0.4 MG SL tablet Place 1 tablet (0.4 mg total) under the tongue every 5 (five) minutes as needed for chest pain. 25 tablet 3  ? simvastatin (ZOCOR) 40 MG tablet TAKE 1 TABLET BY MOUTH AT BEDTIME 30 tablet 6  ? warfarin (COUMADIN) 5 MG tablet TAKE 1 TO 1 & 1/2 (ONE & ONE-HALF) TABLETS BY MOUTH ONCE DAILY AS DIRECTED BY  ANTICOAGULATION  CLINIC 45 tablet 5  ? ?No current facility-administered medications for this visit.  ? ?Allergies:  Benadryl [diphenhydramine] and Chantix [varenicline]  ? ?ROS: No palpitations or syncope. ? ?Physical Exam: ?VS:  BP 138/82   Pulse (!) 56   Ht 5\' 7"  (1.702 m)   Wt 164 lb 12.8 oz (74.8 kg)   SpO2 96%   BMI 25.81 kg/m? , BMI Body mass index is 25.81 kg/m?. ? ?Wt Readings from Last 3 Encounters:  ?05/15/22 164 lb 12.8 oz (74.8  kg)  ?05/10/22 164 lb 3.2 oz (74.5 kg)  ?10/04/21 174 lb (78.9 kg)  ?  ?General: Patient appears comfortable at rest. ?HEENT: Conjunctiva and lids normal. ?Neck: Supple, no elevated JVP or carotid bruits, no thyromegaly. ?Lungs: Clear to auscultation, nonlabored breathing at rest. ?Cardiac: Regular rate and rhythm, no S3, 1/6 systolic murmur, no pericardial rub. ?Extremities: No pitting edema. ? ?ECG:  An ECG dated 10/04/2021 was personally reviewed today and demonstrated:  Sinus rhythm with anterolateral infarct pattern and diffuse nonspecific ST-T wave abnormalities. ? ?Recent Labwork: ?   ?Component Value Date/Time  ? CHOL 199 06/05/2019 0803  ? TRIG 154 (H) 06/05/2019 0803  ? HDL 39 (L) 06/05/2019 0803  ? CHOLHDL 5.1 (H) 06/05/2019  0803  ? VLDL 31 11/11/2018 0937  ? [place] 131 (H) 06/05/2019 0803  ? ? ?Other Studies Reviewed Today: ? ?Echocardiogram 11/06/2021: ? 1. Left ventricular ejection fraction, by estimation, is 45 to 50%. Left  ?ventricular ejection fraction by PLAX is 48 %. The left ventricle has  ?mildly decreased function. Left ventricular endocardial border not  ?optimally defined to evaluate regional wall  ? motion- would recommend contrast administration in future studies. Left  ?ventricular diastolic parameters are consistent with Grade II diastolic  ?dysfunction (pseudonormalization).  ? 2. Right ventricular systolic function is low normal. The right  ?ventricular size is normal. Tricuspid regurgitation signal is inadequate  ?for assessing PA pressure.  ? 3. The mitral valve is grossly normal. No evidence of mitral valve  ?regurgitation. No evidence of mitral stenosis.  ? 4. The aortic valve is tricuspid. Aortic valve regurgitation is not  ?visualized. No aortic stenosis is present.  ? 5. The inferior vena cava is normal in size with greater than 50%  ?respiratory variability, suggesting right atrial pressure of 3 mmHg.  ? ?Assessment and Plan: ? ?1.  Multivessel CAD status post CABG.  He is doing well without angina symptoms on medical therapy, no nitroglycerin use.  Continue aspirin, lisinopril, Lopressor, and Zocor. ? ?2.  HFmrEF with ischemic cardiomyopathy and LVEF 45 to 50% range.  He has a history of LV mural thrombus and we have maintained longer-term anticoagulation with persistent wall motion abnormalities.  Clinically stable without fluid overload. ? ?3.  Mixed hyperlipidemia, continues on Zocor.  Recommended that he follow through on lab work ordered by PCP when he is able. ? ?Medication Adjustments/Labs and Tests Ordered: ?Current medicines are reviewed at length with the patient today.  Concerns regarding medicines are outlined above.  ? ?Tests Ordered: ?No orders of the defined types were placed in this  encounter. ? ? ?Medication Changes: ?No orders of the defined types were placed in this encounter. ? ? ?Disposition:  Follow up  6 months. ? ?Signed, ?Satira Sark, MD, [facility] ?05/15/2022 8:52 AM    ?[facility] [facility] ?[address], [place], [place] [address] ?Phone: [phone]; Fax: [phone]  ?

## 2022-05-15 ENCOUNTER — Ambulatory Visit (INDEPENDENT_AMBULATORY_CARE_PROVIDER_SITE_OTHER): Payer: Self-pay | Admitting: Cardiology

## 2022-05-15 ENCOUNTER — Ambulatory Visit (INDEPENDENT_AMBULATORY_CARE_PROVIDER_SITE_OTHER): Payer: Self-pay | Admitting: *Deleted

## 2022-05-15 ENCOUNTER — Encounter: Payer: Self-pay | Admitting: Cardiology

## 2022-05-15 VITALS — BP 138/82 | HR 56 | Ht 67.0 in | Wt 164.8 lb

## 2022-05-15 DIAGNOSIS — I25119 Atherosclerotic heart disease of native coronary artery with unspecified angina pectoris: Secondary | ICD-10-CM

## 2022-05-15 DIAGNOSIS — Z5181 Encounter for therapeutic drug level monitoring: Secondary | ICD-10-CM

## 2022-05-15 DIAGNOSIS — E782 Mixed hyperlipidemia: Secondary | ICD-10-CM

## 2022-05-15 DIAGNOSIS — I255 Ischemic cardiomyopathy: Secondary | ICD-10-CM

## 2022-05-15 DIAGNOSIS — I513 Intracardiac thrombosis, not elsewhere classified: Secondary | ICD-10-CM

## 2022-05-15 LAB — POCT INR: INR: 2.5 (ref 2.0–3.0)

## 2022-05-15 NOTE — Patient Instructions (Addendum)

## 2022-05-15 NOTE — Patient Instructions (Signed)
Continue warfarin 1 tablet daily except for 1/2 a tablet on Wednesdays. Be consistent with your greens.  ?Recheck in 6 wks ?

## 2022-05-26 ENCOUNTER — Other Ambulatory Visit: Payer: Self-pay | Admitting: Cardiology

## 2022-05-29 NOTE — Telephone Encounter (Signed)
Received request for warfarin refill.  Last INR was 2.5 on 05/15/22 Next INR due on 06/26/22 Last OV was 05/15/22  Myles Gip MD  Refill approved

## 2022-06-26 ENCOUNTER — Ambulatory Visit (INDEPENDENT_AMBULATORY_CARE_PROVIDER_SITE_OTHER): Payer: Self-pay | Admitting: *Deleted

## 2022-06-26 DIAGNOSIS — I513 Intracardiac thrombosis, not elsewhere classified: Secondary | ICD-10-CM

## 2022-06-26 DIAGNOSIS — Z5181 Encounter for therapeutic drug level monitoring: Secondary | ICD-10-CM

## 2022-06-26 LAB — POCT INR: INR: 2.2 (ref 2.0–3.0)

## 2022-07-12 ENCOUNTER — Other Ambulatory Visit: Payer: Self-pay | Admitting: Cardiology

## 2022-07-13 ENCOUNTER — Telehealth: Payer: Self-pay | Admitting: Cardiology

## 2022-07-13 NOTE — Telephone Encounter (Signed)
Called pharmacy and made [facility] aware that pt can be switched to different manufacturer.   Also called pt and changed his appt from 08/07/22 to sooner anticoagulation appt on 08/23/22 at [facility] anticoagulation clinic to ensure there are no changes in INR with manufacturer change. Pt verbalized understanding.

## 2022-07-13 NOTE — Telephone Encounter (Signed)
New Message:    Marchelle Folks called. She said that she needs to use another manufacturer for patient's coumadin. She wants to know if this is alright with Dr Diona Browner?

## 2022-07-23 ENCOUNTER — Ambulatory Visit (INDEPENDENT_AMBULATORY_CARE_PROVIDER_SITE_OTHER): Payer: Self-pay | Admitting: *Deleted

## 2022-07-23 DIAGNOSIS — I513 Intracardiac thrombosis, not elsewhere classified: Secondary | ICD-10-CM

## 2022-07-23 DIAGNOSIS — Z5181 Encounter for therapeutic drug level monitoring: Secondary | ICD-10-CM

## 2022-07-23 LAB — POCT INR: INR: 3 (ref 2.0–3.0)

## 2022-07-23 NOTE — Patient Instructions (Signed)
Continue warfarin 1 tablet daily except for 1/2  tablet on Wednesdays. Be consistent with your greens.  Recheck in 6 wks

## 2022-09-05 ENCOUNTER — Ambulatory Visit: Payer: Self-pay | Attending: Cardiology | Admitting: *Deleted

## 2022-09-05 DIAGNOSIS — I513 Intracardiac thrombosis, not elsewhere classified: Secondary | ICD-10-CM

## 2022-09-05 DIAGNOSIS — Z5181 Encounter for therapeutic drug level monitoring: Secondary | ICD-10-CM

## 2022-09-05 LAB — POCT INR: INR: 4.5 — AB (ref 2.0–3.0)

## 2022-09-05 NOTE — Patient Instructions (Signed)
Hold warfarin tonight then decrease dose to 1 tablet daily except for 1/2  tablet on Wednesdays and Saturdays. Be consistent with your greens.  Recheck in 2.5 wks

## 2022-09-11 ENCOUNTER — Ambulatory Visit: Payer: Medicaid Other | Admitting: Internal Medicine

## 2022-09-13 ENCOUNTER — Other Ambulatory Visit: Payer: Self-pay

## 2022-09-13 ENCOUNTER — Emergency Department (HOSPITAL_COMMUNITY)
Admission: EM | Admit: 2022-09-13 | Discharge: 2022-09-13 | Disposition: A | Discharge status: Home / Self Care | Payer: Self-pay | Attending: Emergency Medicine | Admitting: Emergency Medicine

## 2022-09-13 ENCOUNTER — Encounter (HOSPITAL_COMMUNITY): Payer: Self-pay | Admitting: Emergency Medicine

## 2022-09-13 ENCOUNTER — Emergency Department (HOSPITAL_COMMUNITY): Payer: Self-pay

## 2022-09-13 DIAGNOSIS — Z7982 Long term (current) use of aspirin: Secondary | ICD-10-CM | POA: Insufficient documentation

## 2022-09-13 DIAGNOSIS — X58XXXA Exposure to other specified factors, initial encounter: Secondary | ICD-10-CM | POA: Insufficient documentation

## 2022-09-13 DIAGNOSIS — Y9389 Activity, other specified: Secondary | ICD-10-CM | POA: Insufficient documentation

## 2022-09-13 DIAGNOSIS — S5001XA Contusion of right elbow, initial encounter: Secondary | ICD-10-CM | POA: Insufficient documentation

## 2022-09-13 LAB — PROTIME-INR
INR: 2.2 — ABNORMAL HIGH (ref 0.8–1.2)
Prothrombin Time: 24.1 seconds — ABNORMAL HIGH (ref 11.4–15.2)

## 2022-09-13 NOTE — ED Triage Notes (Signed)
Pt injured right elbow after sliding down waterslide on Sunday, elbow visible swollen and bruised.

## 2022-09-13 NOTE — Discharge Instructions (Signed)
Your INR level is normal  Elevate your arm.

## 2022-09-16 NOTE — ED Provider Notes (Signed)
[facility] EMERGENCY DEPARTMENT Provider Note   CSN: [id number] Arrival date & time: 09/13/22  1220     History  Chief Complaint  Patient presents with   Arm Injury    LE Steve Vang is a 56 y.o. male.  Pt complains of swelling to his right elbow.  Pt reports he hit his elbow going down a waterslide.  Pt reports arm is swollen and tender.  Pt reports area is bruised and still swollen.  He is on coumadin.  Pt reports his last INR was 4.0  The history is provided by the patient. No language interpreter was used.  Arm Injury Location:  Elbow Elbow location:  R elbow Injury: yes   Pain details:    Quality:  Aching   Radiates to:  Does not radiate   Severity:  Moderate   Onset quality:  Gradual   Timing:  Constant   Progression:  Worsening Foreign body present:  No foreign bodies      Home Medications Prior to Admission medications   Medication Sig Start Date End Date Taking? Authorizing Provider  aspirin EC 81 MG tablet Take 81 mg by mouth daily.    [provider]  lisinopril (ZESTRIL) 10 MG tablet Take 1 tablet by mouth once daily 12/12/21   Jonelle Sidle, MD  metoprolol tartrate (LOPRESSOR) 25 MG tablet Take 1 tablet by mouth twice daily 02/26/22   Jonelle Sidle, MD  nitroGLYCERIN (NITROSTAT) 0.4 MG SL tablet Place 1 tablet (0.4 mg total) under the tongue every 5 (five) minutes as needed for chest pain. 10/04/21   Jonelle Sidle, MD  simvastatin (ZOCOR) 40 MG tablet TAKE 1 TABLET BY MOUTH AT BEDTIME 07/13/22   Jonelle Sidle, MD  warfarin (COUMADIN) 5 MG tablet TAKE 1 TO 1 & 1/2 (ONE TO ONE & ONE-HALF) TABLETS BY MOUTH ONCE DAILY AS DIRECTED BY  ANTICOAGULATION  CLINIC 05/29/22   Jonelle Sidle, MD      Allergies    Benadryl [diphenhydramine] and Chantix [varenicline]    Review of Systems   Review of Systems  All other systems reviewed and are negative.   Physical Exam Updated Vital Signs BP (!) 170/90 (BP Location: Left Arm)   Pulse  (!) 57   Temp 97.8 F (36.6 C)   Resp 16   Ht 5\' 7"  (1.702 m)   Wt 74.8 kg   SpO2 100%   BMI 25.84 kg/m  Physical Exam Vitals and nursing note reviewed.  Constitutional:      General: He is not in acute distress.    Appearance: He is well-developed.  HENT:     Head: Normocephalic and atraumatic.  Eyes:     Conjunctiva/sclera: Conjunctivae normal.  Cardiovascular:     Rate and Rhythm: Normal rate and regular rhythm.     Heart sounds: No murmur heard. Pulmonary:     Effort: Pulmonary effort is normal. No respiratory distress.  Musculoskeletal:        General: Swelling and tenderness present.     Cervical back: Neck supple.     Comments: Bruised right elbow and lower right arm   from  nv and ns intact  Skin:    General: Skin is warm and dry.     Capillary Refill: Capillary refill takes less than 2 seconds.  Neurological:     Mental Status: He is alert.  Psychiatric:        Mood and Affect: Mood normal.     ED  Results / Procedures / Treatments   Labs (all labs ordered are listed, but only abnormal results are displayed) Labs Reviewed  PROTIME-INR - Abnormal; Notable for the following components:      Result Value   Prothrombin Time 24.1 (*)    INR 2.2 (*)    All other components within normal limits    EKG None  Radiology No results found.  Procedures Procedures    Medications Ordered in ED Medications - No data to display  ED Course/ Medical Decision Making/ A&P                           Medical Decision Making Pt his elbow 4 daysa go.  Pt is on coumadin and has a large area of swelling and bruising   Amount and/or Complexity of Data Reviewed Independent Historian: spouse    Details: Pt here with wife who is supportive  Labs: ordered. Decision-making details documented in ED Course.    Details: INR is 2.2  Radiology: ordered and independent interpretation performed. Decision-making details documented in ED Course.    Details: Right elbow effusion    Risk Risk Details: Pt counseled on hematoma's.  Pt advised to follow up with his MD. Ace wrap to swollen area            Final Clinical Impression(s) / ED Diagnoses Final diagnoses:  Traumatic hematoma of right elbow, initial encounter    Rx / DC Orders ED Discharge Orders     None     An After Visit Summary was printed and given to the patient.     Fransico Meadow, Hershal Coria 09/16/22 1632    Milton Ferguson, MD 09/22/22 1258

## 2022-09-24 ENCOUNTER — Ambulatory Visit: Payer: Medicaid Other | Attending: Cardiology | Admitting: *Deleted

## 2022-09-24 DIAGNOSIS — I513 Intracardiac thrombosis, not elsewhere classified: Secondary | ICD-10-CM | POA: Insufficient documentation

## 2022-09-24 DIAGNOSIS — Z5181 Encounter for therapeutic drug level monitoring: Secondary | ICD-10-CM | POA: Insufficient documentation

## 2022-09-24 LAB — POCT INR: INR: 3.8 — AB (ref 2.0–3.0)

## 2022-09-24 NOTE — Patient Instructions (Signed)
Description   Hold warfarin today and then START taking warfarin 1 tablet daily expect for 1/2 a tablet on Monday, Wednesday and Friday. Recheck INR in 2 weeks.

## 2022-10-04 ENCOUNTER — Ambulatory Visit: Payer: Medicaid Other | Attending: Cardiology | Admitting: *Deleted

## 2022-10-04 DIAGNOSIS — Z5181 Encounter for therapeutic drug level monitoring: Secondary | ICD-10-CM

## 2022-10-04 DIAGNOSIS — I513 Intracardiac thrombosis, not elsewhere classified: Secondary | ICD-10-CM

## 2022-10-04 LAB — POCT INR: INR: 1.8 — AB (ref 2.0–3.0)

## 2022-10-04 NOTE — Patient Instructions (Signed)
Take warfarin 1 1/2 tablets tonight then increase dose to 1 tablet daily expect for 1/2 a tablet on Sundays and Wednesdays.  Recheck INR in 3 weeks.

## 2022-10-15 ENCOUNTER — Other Ambulatory Visit: Payer: Self-pay | Admitting: Cardiology

## 2022-10-25 ENCOUNTER — Ambulatory Visit: Payer: Medicaid Other | Attending: Cardiology | Admitting: *Deleted

## 2022-10-25 DIAGNOSIS — Z5181 Encounter for therapeutic drug level monitoring: Secondary | ICD-10-CM

## 2022-10-25 DIAGNOSIS — I513 Intracardiac thrombosis, not elsewhere classified: Secondary | ICD-10-CM

## 2022-10-25 LAB — POCT INR: POC INR: 2.9

## 2022-10-25 NOTE — Patient Instructions (Signed)
Description   Continue dose 1 tablet daily expect for 1/2 a tablet on Sundays and Wednesdays.  Recheck INR in 4 weeks.

## 2022-11-13 NOTE — Progress Notes (Unsigned)
Cardiology Office Note  Date: 11/15/2022   ID: Steve Vang, DOB 1966/12/22, MRN [id number]  PCP:  Patient, No Pcp Per  Cardiologist:  [name], MD Electrophysiologist:  None   Chief Complaint  Patient presents with   Cardiac follow-up    History of Present Illness: Steve Vang is a 56 y.o. male last seen in May.  He is here for a routine visit.  Reports occasional angina depending on level of activity (such as carrying in wood).  Resolves with rest, he rarely has to use nitroglycerin.  Reports NYHA class II dyspnea, no palpitations or syncope.  He remains on Coumadin with follow-up in the anticoagulation clinic.  No reported spontaneous bleeding problems.  I reviewed his medications which are noted below.  We discussed adjustments.  I personally reviewed his ECG today which shows sinus bradycardia with left atrial enlargement, old anterior infarct pattern, nonspecific ST-T changes.  He has not had a recent visit with his PCP.  Last echocardiogram was in November 2022 at which point LVEF was 45 to 50%.  Past Medical History:  Diagnosis Date   CAD (coronary artery disease)    Multivessel, LVEF 45-50%   Cardiomyopathy (HCC)    LVEF 50-55% December 2016   CKD (chronic kidney disease) stage 2, GFR 60-89 ml/min    COPD (chronic obstructive pulmonary disease) (HCC)    Depression    Essential hypertension    Falls    Gallstone    Gastroenteritis    History of stroke 2004   Hyperlipidemia    Left ventricular mural thrombus    On Coumadin   Stroke [facility])     Past Surgical History:  Procedure Laterality Date   CORONARY ARTERY BYPASS GRAFT     DOR anterior ventricular restoration surgery 8/06, [facility], [place] - LIMA to first diagonal, SVG to PLB, SVG to RVE branch of nondominant RCA   EYE SURGERY     TOTAL HIP ARTHROPLASTY Left 06/08/2016   Procedure: TOTAL HIP ARTHROPLASTY ANTERIOR APPROACH;  Surgeon: Kathryne Hitch, MD;  Location: [facility];  Service:  Orthopedics;  Laterality: Left;   TOTAL HIP ARTHROPLASTY Right 07/20/2016   Procedure: RIGHT TOTAL HIP ARTHROPLASTY ANTERIOR APPROACH;  Surgeon: Kathryne Hitch, MD;  Location: [facility];  Service: Orthopedics;  Laterality: Right;    Current Outpatient Medications  Medication Sig Dispense Refill   aspirin EC 81 MG tablet Take 81 mg by mouth daily.     carvedilol (COREG) 6.25 MG tablet Take 1 tablet (6.25 mg total) by mouth 2 (two) times daily. 180 tablet 1   isosorbide mononitrate (IMDUR) 30 MG 24 hr tablet Take 1 tablet (30 mg total) by mouth every evening. 90 tablet 1   lisinopril (ZESTRIL) 10 MG tablet Take 1 tablet by mouth once daily 90 tablet 3   nitroGLYCERIN (NITROSTAT) 0.4 MG SL tablet Place 1 tablet (0.4 mg total) under the tongue every 5 (five) minutes as needed for chest pain. 25 tablet 3   warfarin (COUMADIN) 5 MG tablet TAKE 1 TO 1 & 1/2 (ONE TO ONE & ONE-HALF) TABLETS BY MOUTH ONCE DAILY AS DIRECTED BY  ANTICOAGULATION  CLINIC 45 tablet 3   simvastatin (ZOCOR) 40 MG tablet Take 1 tablet (40 mg total) by mouth daily at 6 (six) AM. Need lab work done 90 tablet 1   No current facility-administered medications for this visit.   Allergies:  Benadryl [diphenhydramine] and Chantix [varenicline]   ROS: No syncope.  Physical Exam: VS:  BP (!) 148/88   Pulse (!) 59   Ht 5\' 7"  (1.702 m)   Wt 169 lb 3.2 oz (76.7 kg)   SpO2 98%   BMI 26.50 kg/m , BMI Body mass index is 26.5 kg/m.  Wt Readings from Last 3 Encounters:  11/15/22 169 lb 3.2 oz (76.7 kg)  09/13/22 165 lb (74.8 kg)  05/15/22 164 lb 12.8 oz (74.8 kg)    General: Patient appears comfortable at rest. HEENT: Conjunctiva and lids normal. Neck: Supple, no elevated JVP or carotid bruits. Lungs: Clear to auscultation, nonlabored breathing at rest. Cardiac: Regular rate and rhythm, no S3 or significant systolic murmur. Extremities: No pitting edema.  ECG:  An ECG dated 10/04/2021 was personally reviewed today and  demonstrated:  Sinus rhythm with anterolateral infarct pattern and diffuse nonspecific ST-T wave abnormalities.  Recent Labwork:    Component Value Date/Time   CHOL 199 06/05/2019 0803   TRIG 154 (H) 06/05/2019 0803   HDL 39 (L) 06/05/2019 0803   CHOLHDL 5.1 (H) 06/05/2019 0803   VLDL 31 11/11/2018 0937   LDLCALC 131 (H) 06/05/2019 0803    Other Studies Reviewed Today:  Echocardiogram 11/06/2021:  1. Left ventricular ejection fraction, by estimation, is 45 to 50%. Left  ventricular ejection fraction by PLAX is 48 %. The left ventricle has  mildly decreased function. Left ventricular endocardial border not  optimally defined to evaluate regional wall   motion- would recommend contrast administration in future studies. Left  ventricular diastolic parameters are consistent with Grade II diastolic  dysfunction (pseudonormalization).   2. Right ventricular systolic function is low normal. The right  ventricular size is normal. Tricuspid regurgitation signal is inadequate  for assessing PA pressure.   3. The mitral valve is grossly normal. No evidence of mitral valve  regurgitation. No evidence of mitral stenosis.   4. The aortic valve is tricuspid. Aortic valve regurgitation is not  visualized. No aortic stenosis is present.   5. The inferior vena cava is normal in size with greater than 50%  respiratory variability, suggesting right atrial pressure of 3 mmHg.   Assessment and Plan:  1.  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  He has history of LV mural thrombus and we have continued long-term anticoagulation given persistent wall motion abnormalities.  Continue Coumadin with follow-up in the anticoagulation clinic.  Plan to change Lopressor to Coreg 6.25 mg twice daily.  Also add Imdur 30 mg once in the evening for angina control.  Update echocardiogram.  Otherwise continue lisinopril.  2.  Multivessel CAD status post CABG.  Adding Imdur for angina control as  discussed above.  Continue aspirin and Zocor.  He has as needed nitroglycerin available.  ECG reviewed.  Medication Adjustments/Labs and Tests Ordered: Current medicines are reviewed at length with the patient today.  Concerns regarding medicines are outlined above.   Tests Ordered: Orders Placed This Encounter  Procedures   EKG 12-Lead   ECHOCARDIOGRAM COMPLETE    Medication Changes: Meds ordered this encounter  Medications   simvastatin (ZOCOR) 40 MG tablet    Sig: Take 1 tablet (40 mg total) by mouth daily at 6 (six) AM. Need lab work done    Dispense:  90 tablet    Refill:  1   isosorbide mononitrate (IMDUR) 30 MG 24 hr tablet    Sig: Take 1 tablet (30 mg total) by mouth every evening.    Dispense:  90 tablet    Refill:  1  11/15/22 New Start   carvedilol (COREG) 6.25 MG tablet    Sig: Take 1 tablet (6.25 mg total) by mouth 2 (two) times daily.    Dispense:  180 tablet    Refill:  1    11/15/22 New Start    Disposition:  Follow up  6 months.  Signed, Satira Sark, MD, [facility] 11/15/2022 9:06 AM    [facility] [place], [place], [place] [address] Phone: [phone]; Fax: [phone]

## 2022-11-15 ENCOUNTER — Ambulatory Visit (INDEPENDENT_AMBULATORY_CARE_PROVIDER_SITE_OTHER): Payer: Self-pay | Admitting: *Deleted

## 2022-11-15 ENCOUNTER — Encounter: Payer: Self-pay | Admitting: Cardiology

## 2022-11-15 ENCOUNTER — Ambulatory Visit: Payer: Self-pay | Attending: Cardiology | Admitting: Cardiology

## 2022-11-15 VITALS — BP 148/88 | HR 59 | Ht 67.0 in | Wt 169.2 lb

## 2022-11-15 DIAGNOSIS — I25119 Atherosclerotic heart disease of native coronary artery with unspecified angina pectoris: Secondary | ICD-10-CM

## 2022-11-15 DIAGNOSIS — I255 Ischemic cardiomyopathy: Secondary | ICD-10-CM

## 2022-11-15 DIAGNOSIS — Z5181 Encounter for therapeutic drug level monitoring: Secondary | ICD-10-CM

## 2022-11-15 DIAGNOSIS — I513 Intracardiac thrombosis, not elsewhere classified: Secondary | ICD-10-CM

## 2022-11-15 LAB — POCT INR: INR: 3.6 — AB (ref 2.0–3.0)

## 2022-11-15 MED ORDER — CARVEDILOL 6.25 MG PO TABS: 6.2500 mg | ORAL_TABLET | Freq: Two times a day (BID) | ORAL | 1 refills | Status: DC

## 2022-11-15 MED ORDER — SIMVASTATIN 40 MG PO TABS: 40.0000 mg | ORAL_TABLET | Freq: Every day | ORAL | 1 refills | Status: DC

## 2022-11-15 MED ORDER — ISOSORBIDE MONONITRATE ER 30 MG PO TB24: 30.0000 mg | ORAL_TABLET | Freq: Every evening | ORAL | 1 refills | Status: DC

## 2022-11-15 NOTE — Patient Instructions (Signed)
Hold warfarin tonight then resume 1 tablet daily expect for 1/2 a tablet on Sundays and Wednesdays.  Recheck INR in 3 weeks.

## 2022-11-15 NOTE — Patient Instructions (Addendum)
Medication Instructions:  Your physician has recommended you make the following change in your medication:  Stop lopressor Start coreg 6.25 mg twice a day Start Imdur 30 mg every evening Continue all other medications as directed  Labwork: none  Testing/Procedures: Your physician has requested that you have an echocardiogram. Echocardiography is a painless test that uses sound waves to create images of your heart. It provides your doctor with information about the size and shape of your heart and how well your heart's chambers and valves are working. This procedure takes approximately one hour. There are no restrictions for this procedure. Please do NOT wear cologne, perfume, aftershave, or lotions (deodorant is allowed). Please arrive 15 minutes prior to your appointment time.   Follow-Up:  Your physician recommends that you schedule a follow-up appointment in: 6 months  Any Other Special Instructions Will Be Listed Below (If Applicable).  If you need a refill on your cardiac medications before your next appointment, please call your pharmacy.

## 2022-11-20 ENCOUNTER — Other Ambulatory Visit: Payer: Medicaid Other

## 2022-11-25 ENCOUNTER — Other Ambulatory Visit: Payer: Self-pay | Admitting: Cardiology

## 2022-11-26 ENCOUNTER — Ambulatory Visit: Payer: Medicaid Other | Attending: Cardiology

## 2022-11-26 DIAGNOSIS — I255 Ischemic cardiomyopathy: Secondary | ICD-10-CM

## 2022-11-26 LAB — ECHOCARDIOGRAM COMPLETE
AR max vel: 2.08 cm2
AV Peak grad: 5.5 mmHg
Ao pk vel: 1.18 m/s
Area-P 1/2: 3.5 cm2
Calc EF: 52.4 %
MV M vel: 3.38 m/s
MV Peak grad: 45.7 mmHg
S' Lateral: 4 cm
Single Plane A2C EF: 56.8 %
Single Plane A4C EF: 48.2 %

## 2022-11-26 MED ORDER — PERFLUTREN LIPID MICROSPHERE
1.0000 mL | INTRAVENOUS | Status: AC | PRN
  Administered 2022-11-26: 2 mL via INTRAVENOUS

## 2022-11-29 ENCOUNTER — Other Ambulatory Visit: Payer: Self-pay | Admitting: *Deleted

## 2023-01-28 ENCOUNTER — Telehealth: Payer: Self-pay | Admitting: Cardiology

## 2023-01-28 NOTE — Telephone Encounter (Signed)
Spoke with patient at time of walk in.  Rating his cp 1-2/10.  States this happens mostly with activity & has been noticing more since beginning of January.  Has not had to use his NTG.  Does also state that he has SOB but that is not new.  Patient last seen 10/2022 & was started on Imdur 30mg  daily - states this has not helped much.  Informed patient on proper use of Nitroglycerin & ED if symptoms worsen in the meantime.  He verbalized understanding.

## 2023-01-28 NOTE — Telephone Encounter (Signed)
Pt c/o of Chest Pain: STAT if CP now or developed within 24 hours  1. Are you having CP right now?   Yes, when he stands up and walks he's getting chest pain, he just walked into the office and had to stop walking and he is having a pain in his chest   2. Are you experiencing any other symptoms (ex. SOB, nausea, vomiting, sweating)?    SOB-   3. How long have you been experiencing CP?    Around Jan 1st  4. Is your CP continuous or coming and going?   It stops when he sits down for a min  5. Have you taken Nitroglycerin?   No  ?

## 2023-04-01 ENCOUNTER — Ambulatory Visit: Payer: Self-pay | Admitting: *Deleted

## 2023-05-16 NOTE — Progress Notes (Signed)
Cardiology Office Note  Date: 05/17/2023   ID: RIKER FLEMMING, DOB [birth year], MRN [id number]  History of Present Illness: KERION WIEBELHAUS is a 57 y.o. male last seen in November 2023.  He is here for a routine visit.  He reports exertional chest tightness and left arm discomfort consistent with angina.  This resolves when he stops and rests for a minute, he has not used any nitroglycerin as yet.  We did modify his medications previously.  He states that he has been compliant with therapy.  He has not yet had follow-up lab work which was ordered last year by his PCP.  He is not following with Dr. Allena Katz anymore.  We discussed his current medications and will increase Imdur to 60 mg daily, may need lisinopril dose increased as well but will take this step at a time.  We also discussed follow-up ischemic testing.  He is no longer on Coumadin.  Physical Exam: VS:  BP (!) 154/91   Pulse 62   Ht 5\' 7"  (1.702 m)   Wt 169 lb 9.6 oz (76.9 kg)   SpO2 92%   BMI 26.56 kg/m , BMI Body mass index is 26.56 kg/m.  Wt Readings from Last 3 Encounters:  05/17/23 169 lb 9.6 oz (76.9 kg)  11/15/22 169 lb 3.2 oz (76.7 kg)  09/13/22 165 lb (74.8 kg)    General: Patient appears comfortable at rest. HEENT: Conjunctiva and lids normal. Neck: Supple, no elevated JVP or carotid bruits. Lungs: Clear to auscultation, nonlabored breathing at rest. Cardiac: Regular rate and rhythm, no S3, 1/6 systolic murmur. Extremities: No pitting edema.  ECG:  An ECG dated 11/15/2022 was personally reviewed today and demonstrated:  Sinus bradycardia with left atrial enlargement, old anterior infarct pattern, nonspecific ST-T changes.  Labwork:  No interval lab work for review today.  Other Studies Reviewed Today:  Echocardiogram 11/26/2022:  1. Left ventricular ejection fraction, by estimation, is approximately  50%. The left ventricle has mildly decreased function. The left ventricle  demonstrates regional wall  motion abnormalities (see scoring  diagram/findings for description). Left  ventricular diastolic parameters are consistent with Grade II diastolic  dysfunction (pseudonormalization). The average left ventricular global  longitudinal strain is -15.3 %. The global longitudinal strain is  abnormal.   2. No LV mural thrombus noted with Definity contrast.   3. Right ventricular systolic function is normal. The right ventricular  size is normal. Tricuspid regurgitation signal is inadequate for assessing  PA pressure.   4. The mitral valve is grossly normal. Trivial mitral valve  regurgitation.   5. The aortic valve is tricuspid. Aortic valve regurgitation is not  visualized.   6. The inferior vena cava is normal in size with greater than 50%  respiratory variability, suggesting right atrial pressure of 3 mmHg.   Assessment and Plan:  1.  Multivessel CAD status post CABG in 2006 with LIMA to first diagonal, SVG to PLB, SVG to RVE branch of nondominant RCA.  He reports exertional angina on medical therapy.  Currently on aspirin, Coreg, lisinopril, Imdur, Zocor, and as needed nitroglycerin.  Increase Imdur to 60 mg daily.  Follow-up Lexiscan Myoview to assess ischemic burden.  We will arrange close office follow-up and determine if further evaluation to assess for revascularization options is necessary.  In the meanwhile I have recommended that he go ahead and get lab work including CBC, CMET, and FLP.  2.  HFmrEF with ischemic cardiomyopathy, LVEF approximately 50% by echocardiogram in  November 2023.  3.  History of LV mural thrombus, resolved and no longer on Coumadin.  4.  Essential hypertension.  Pressure is elevated today.  Imdur being increased as discussed above, may need to go up on lisinopril dose next.  5.  Mixed hyperlipidemia.  He remains on the Zocor.  I encouraged him to go ahead and follow-up on lab work which has been ordered.  Disposition:  Follow up  in the next few weeks with  APP.  Signed, Jonelle Sidle, M.D., F.A.C.C. [facility] at [facility]

## 2023-05-17 ENCOUNTER — Ambulatory Visit: Payer: Medicaid Other | Attending: Cardiology | Admitting: Cardiology

## 2023-05-17 ENCOUNTER — Encounter: Payer: Self-pay | Admitting: *Deleted

## 2023-05-17 ENCOUNTER — Encounter: Payer: Self-pay | Admitting: Cardiology

## 2023-05-17 VITALS — BP 154/91 | HR 62 | Ht 67.0 in | Wt 169.6 lb

## 2023-05-17 DIAGNOSIS — I255 Ischemic cardiomyopathy: Secondary | ICD-10-CM | POA: Diagnosis not present

## 2023-05-17 DIAGNOSIS — Z79899 Other long term (current) drug therapy: Secondary | ICD-10-CM

## 2023-05-17 DIAGNOSIS — I25119 Atherosclerotic heart disease of native coronary artery with unspecified angina pectoris: Secondary | ICD-10-CM

## 2023-05-17 DIAGNOSIS — E782 Mixed hyperlipidemia: Secondary | ICD-10-CM | POA: Diagnosis not present

## 2023-05-17 DIAGNOSIS — I10 Essential (primary) hypertension: Secondary | ICD-10-CM

## 2023-05-17 MED ORDER — LISINOPRIL 10 MG PO TABS: 10.0000 mg | ORAL_TABLET | Freq: Every day | ORAL | 1 refills | Status: DC

## 2023-05-17 MED ORDER — SIMVASTATIN 40 MG PO TABS: 40.0000 mg | ORAL_TABLET | Freq: Every day | ORAL | 0 refills | Status: DC

## 2023-05-17 MED ORDER — ISOSORBIDE MONONITRATE ER 60 MG PO TB24: 60.0000 mg | ORAL_TABLET | Freq: Every day | ORAL | 1 refills | Status: DC

## 2023-05-17 MED ORDER — CARVEDILOL 6.25 MG PO TABS: 6.2500 mg | ORAL_TABLET | Freq: Two times a day (BID) | ORAL | 1 refills | Status: DC

## 2023-05-17 MED ORDER — NITROGLYCERIN 0.4 MG SL SUBL: 0.4000 mg | SUBLINGUAL_TABLET | SUBLINGUAL | 1 refills | Status: DC | PRN

## 2023-05-17 NOTE — Patient Instructions (Addendum)
Medication Instructions:  Your physician has recommended you make the following change in your medication:  Increase isosorbide mononitrate to 60 mg daily Continue other medications the same  Labwork: Your physician recommends that you return for a FASTING lipid profile, CMET and CBC. Please do not eat or drink for at least 8 hours when you have this done. You may take your medications that morning with a sip of water. [employer] ([email or web address] [place])  Testing/Procedures: Your physician has requested that you have a lexiscan myoview. For further information please visit [email or web address] Please follow instruction sheet, as given.  Follow-Up: Your physician recommends that you schedule a follow-up appointment in: 2 weeks  Any Other Special Instructions Will Be Listed Below (If Applicable).  If you need a refill on your cardiac medications before your next appointment, please call your pharmacy.

## 2023-05-23 LAB — COMPREHENSIVE METABOLIC PANEL
ALT: 39 IU/L (ref 0–44)
AST: 24 IU/L (ref 0–40)
Albumin/Globulin Ratio: 1.6 (ref 1.2–2.2)
Albumin: 4.5 g/dL (ref 3.8–4.9)
Alkaline Phosphatase: 109 IU/L (ref 44–121)
BUN/Creatinine Ratio: 15 (ref 9–20)
BUN: 21 mg/dL (ref 6–24)
Bilirubin Total: 0.5 mg/dL (ref 0.0–1.2)
CO2: 22 mmol/L (ref 20–29)
Calcium: 9.7 mg/dL (ref 8.7–10.2)
Chloride: 101 mmol/L (ref 96–106)
Creatinine, Ser: 1.42 mg/dL — ABNORMAL HIGH (ref 0.76–1.27)
Globulin, Total: 2.9 g/dL (ref 1.5–4.5)
Glucose: 140 mg/dL — ABNORMAL HIGH (ref 70–99)
Potassium: 4.6 mmol/L (ref 3.5–5.2)
Sodium: 139 mmol/L (ref 134–144)
Total Protein: 7.4 g/dL (ref 6.0–8.5)
eGFR: 58 mL/min/{1.73_m2} — ABNORMAL LOW (ref >59)

## 2023-05-23 LAB — LIPID PANEL
Chol/HDL Ratio: 5.7 ratio — ABNORMAL HIGH (ref 0.0–5.0)
Cholesterol, Total: 221 mg/dL — ABNORMAL HIGH (ref 100–199)
HDL: 39 mg/dL — ABNORMAL LOW (ref >39)
LDL Chol Calc (NIH): 151 mg/dL — ABNORMAL HIGH (ref 0–99)
Triglycerides: 171 mg/dL — ABNORMAL HIGH (ref 0–149)
VLDL Cholesterol Cal: 31 mg/dL (ref 5–40)

## 2023-05-23 LAB — CBC
Hematocrit: 56 % — ABNORMAL HIGH (ref 37.5–51.0)
Hemoglobin: 18.4 g/dL — ABNORMAL HIGH (ref 13.0–17.7)
MCH: 30.5 pg (ref 26.6–33.0)
MCHC: 32.9 g/dL (ref 31.5–35.7)
MCV: 93 fL (ref 79–97)
Platelets: 183 10*3/uL (ref 150–450)
RBC: 6.03 x10E6/uL — ABNORMAL HIGH (ref 4.14–5.80)
RDW: 13.1 % (ref 11.6–15.4)
WBC: 5.1 10*3/uL (ref 3.4–10.8)

## 2023-05-28 ENCOUNTER — Ambulatory Visit (HOSPITAL_COMMUNITY)
Admission: RE | Admit: 2023-05-28 | Discharge: 2023-05-28 | Disposition: A | Discharge status: Home / Self Care | Payer: Medicaid Other | Source: Ambulatory Visit | Attending: Cardiology | Admitting: Cardiology

## 2023-05-28 DIAGNOSIS — I25119 Atherosclerotic heart disease of native coronary artery with unspecified angina pectoris: Secondary | ICD-10-CM | POA: Diagnosis present

## 2023-05-28 LAB — NM MYOCAR MULTI W/SPECT W/WALL MOTION / EF
LV dias vol: 170 mL (ref 62–150)
LV sys vol: 111 mL
Nuc Stress EF: 35 %
Peak HR: 100 {beats}/min
RATE: 0.4
Rest HR: 60 {beats}/min
Rest Nuclear Isotope Dose: 10.7 mCi
SDS: 3
SRS: 27
SSS: 30
ST Depression (mm): 0 mm
Stress Nuclear Isotope Dose: 30.3 mCi
TID: 1.16

## 2023-05-28 MED ORDER — TECHNETIUM TC 99M TETROFOSMIN IV KIT
10.7000 | PACK | Freq: Once | INTRAVENOUS | Status: AC | PRN
  Administered 2023-05-28: 10.7 via INTRAVENOUS

## 2023-05-28 MED ORDER — TECHNETIUM TC 99M TETROFOSMIN IV KIT
30.3000 | PACK | Freq: Once | INTRAVENOUS | Status: AC | PRN
  Administered 2023-05-28: 30.3 via INTRAVENOUS

## 2023-05-28 MED ORDER — SODIUM CHLORIDE FLUSH 0.9 % IV SOLN
INTRAVENOUS | Status: AC
  Administered 2023-05-28: 10 mL via INTRAVENOUS
  Filled 2023-05-28: qty 10

## 2023-05-28 MED ORDER — REGADENOSON 0.4 MG/5ML IV SOLN
INTRAVENOUS | Status: AC
  Administered 2023-05-28: 0.4 mg via INTRAVENOUS
  Filled 2023-05-28: qty 5

## 2023-05-29 ENCOUNTER — Telehealth: Payer: Self-pay

## 2023-05-29 DIAGNOSIS — I255 Ischemic cardiomyopathy: Secondary | ICD-10-CM

## 2023-05-29 DIAGNOSIS — Z79899 Other long term (current) drug therapy: Secondary | ICD-10-CM

## 2023-05-29 MED ORDER — ROSUVASTATIN CALCIUM 20 MG PO TABS: 20.0000 mg | ORAL_TABLET | Freq: Every day | ORAL | 3 refills | Status: DC

## 2023-05-29 NOTE — Telephone Encounter (Signed)
-----   Message from Ellsworth Lennox, [place] sent at 05/28/2023  7:51 PM EDT ----- Covering for Dr. Diona Browner - Please let the patient know his stress test showed evidence of prior blockages and scarring but no current ischemia (blockages). Pumping function of his heart was read as being reduced and would recommend a limited echocardiogram for reassessment. It appears he already has follow-up scheduled for next week and would keep that for reassessment of symptoms as he may need additional titration of medical therapy.

## 2023-05-29 NOTE — Telephone Encounter (Signed)
-----   Message from Ellsworth Lennox, [place] sent at 05/28/2023  7:47 AM EDT ----- Covering for Dr. Diona Browner - Please let the patient know his cholesterol is significantly elevated with total cholesterol at 221 and LDL 151. Would recommend stopping Zocor and switching to Crestor 20mg  daily. Liver function within a normal range. Kidney function is abnormal with creatinine at 1.42 and it is unclear if this is his baseline as most recent labs for  comparison are from 2021 and creatinine was at 0.95 then but previously 1.44 in 2018. Would avoid NSAIDS. Hgb slightly elevated at 18.4 and platelets normal at 183. Would repeat FLP and CMET in 2 months following medication adjustments.

## 2023-05-29 NOTE — Telephone Encounter (Signed)
Patient notified and verbalized understanding. Patient had no questions or concerns at this time. Patient stated he would keep upcoming follow up with provider. No PCP on file.

## 2023-06-03 ENCOUNTER — Ambulatory Visit: Payer: Medicaid Other | Attending: Nurse Practitioner | Admitting: Nurse Practitioner

## 2023-06-03 ENCOUNTER — Encounter: Payer: Self-pay | Admitting: Nurse Practitioner

## 2023-06-03 VITALS — BP 138/88 | HR 75 | Ht 67.0 in | Wt 174.2 lb

## 2023-06-03 DIAGNOSIS — I5022 Chronic systolic (congestive) heart failure: Secondary | ICD-10-CM | POA: Diagnosis not present

## 2023-06-03 DIAGNOSIS — E782 Mixed hyperlipidemia: Secondary | ICD-10-CM | POA: Diagnosis not present

## 2023-06-03 DIAGNOSIS — I25118 Atherosclerotic heart disease of native coronary artery with other forms of angina pectoris: Secondary | ICD-10-CM | POA: Diagnosis not present

## 2023-06-03 DIAGNOSIS — Z8673 Personal history of transient ischemic attack (TIA), and cerebral infarction without residual deficits: Secondary | ICD-10-CM

## 2023-06-03 DIAGNOSIS — R7989 Other specified abnormal findings of blood chemistry: Secondary | ICD-10-CM

## 2023-06-03 DIAGNOSIS — I513 Intracardiac thrombosis, not elsewhere classified: Secondary | ICD-10-CM | POA: Diagnosis not present

## 2023-06-03 DIAGNOSIS — Z720 Tobacco use: Secondary | ICD-10-CM

## 2023-06-03 DIAGNOSIS — J449 Chronic obstructive pulmonary disease, unspecified: Secondary | ICD-10-CM

## 2023-06-03 MED ORDER — ISOSORBIDE MONONITRATE ER 60 MG PO TB24: 90.0000 mg | ORAL_TABLET | Freq: Every day | ORAL | 1 refills | Status: DC

## 2023-06-03 NOTE — Progress Notes (Signed)
Office Visit    Patient Name: Steve Vang Date of Encounter: 06/03/2023  PCP:  Patient, No Pcp Per   [facility]  Cardiologist:  [name], MD  Advanced Practice Provider:  No care team member to display Electrophysiologist:  None  0746} Chief Complaint    Steve Vang is a 57 y.o. male with a hx of multivessel CAD, s/p CABG in 2006, HFmrEF, past history of LV mural thrombus, hypertension, mixed hyperlipidemia, prior history of CVA, COPD, and CKD, who presents today for 2-week follow-up.  Past Medical History    Past Medical History:  Diagnosis Date   CAD (coronary artery disease)    Multivessel, LVEF 45-50%   Cardiomyopathy (HCC)    LVEF 50-55% December 2016   CKD (chronic kidney disease) stage 2, GFR 60-89 ml/min    COPD (chronic obstructive pulmonary disease) (HCC)    Depression    Essential hypertension    Falls    Gallstone    Gastroenteritis    History of stroke 2004   Hyperlipidemia    Left ventricular mural thrombus    On Coumadin   Stroke [facility])    Past Surgical History:  Procedure Laterality Date   CORONARY ARTERY BYPASS GRAFT     DOR anterior ventricular restoration surgery 8/06, [facility] [place] to first diagonal, SVG to PLB, SVG to RVE branch of nondominant RCA   EYE SURGERY     TOTAL HIP ARTHROPLASTY Left 06/08/2016   Procedure: TOTAL HIP ARTHROPLASTY ANTERIOR APPROACH;  Surgeon: Kathryne Hitch, MD;  Location: [facility];  Service: Orthopedics;  Laterality: Left;   TOTAL HIP ARTHROPLASTY Right 07/20/2016   Procedure: RIGHT TOTAL HIP ARTHROPLASTY ANTERIOR APPROACH;  Surgeon: Kathryne Hitch, MD;  Location: [facility];  Service: Orthopedics;  Laterality: Right;    Allergies  Allergies  Allergen Reactions   Benadryl [Diphenhydramine] Hives and Swelling   Chantix [Varenicline] Nausea And Vomiting    History of Present Illness    GEOGE Vang is a 57 y.o. male with a PMH as mentioned above.  Last seen by  Dr. Diona Browner on May 17, 2023.  He reported exertional chest tightness, left arm discomfort consistent with angina.  Symptoms resolved with rest, denied any nitroglycerin use.  Imdur was increased to 60 mg daily.  Myoview was arranged and revealed evidence of prior MI, study was deemed intermediate to high risk based on large area of scar along apical area with decreased LVEF, no current ischemia noted during testing.  LVEF 30 to 44%.  Randall An, PA-C recommended limited echocardiogram to evaluate EF, study ordered but has not been performed.  Today he presents for follow-up.  He states overall his chest pain "is not hurting as much." Better than previous. Admits to an episode of chest pain this past Saturday while walking uphill, rated 2/10 in intensity. States he did not have to take nitroglycerin. Denies any recurrent chest pain. Denies any shortness of breath, palpitations, syncope, presyncope, dizziness, orthopnea, PND, swelling or significant weight changes, acute bleeding, or claudication.  EKGs/Labs/Other Studies Reviewed:   The following studies were reviewed today:   EKG:  EKG is not ordered today.   Myoview 05/2023:   Findings are consistent with large extensive apical infarction. Intermediate to high risk study based on large area of scar and decreased LVEF. There is no current myocardium at jeopardy.  Consider correlating LVEF with echo.   No ST deviation was noted.   LV perfusion  is abnormal. Large severe intensity fixed extensive apical defect consistent with large area of scar. There is no current ischemia.   Left ventricular function is abnormal. Nuclear stress EF: 35 %. The left ventricular ejection fraction is moderately decreased (30-44%). End diastolic cavity size is severely enlarged.  Echocardiogram 10/2022: 1. Left ventricular ejection fraction, by estimation, is approximately  50%. The left ventricle has mildly decreased function. The left ventricle  demonstrates  regional wall motion abnormalities (see scoring  diagram/findings for description). Left  ventricular diastolic parameters are consistent with Grade II diastolic  dysfunction (pseudonormalization). The average left ventricular global  longitudinal strain is -15.3 %. The global longitudinal strain is  abnormal.   2. No LV mural thrombus noted with Definity contrast.   3. Right ventricular systolic function is normal. The right ventricular  size is normal. Tricuspid regurgitation signal is inadequate for assessing  PA pressure.   4. The mitral valve is grossly normal. Trivial mitral valve  regurgitation.   5. The aortic valve is tricuspid. Aortic valve regurgitation is not  visualized.   6. The inferior vena cava is normal in size with greater than 50%  respiratory variability, suggesting right atrial pressure of 3 mmHg.   Comparison(s): Prior images reviewed side by side. LVEF low normal at  approximately 50%. No LV mural thrombus.  Recent Labs: 05/22/2023: ALT 39; BUN 21; Creatinine, Ser 1.42; Hemoglobin 18.4; Platelets 183; Potassium 4.6; Sodium 139  Recent Lipid Panel    Component Value Date/Time   CHOL 221 (H) 05/22/2023 0806   TRIG 171 (H) 05/22/2023 0806   HDL 39 (L) 05/22/2023 0806   CHOLHDL 5.7 (H) 05/22/2023 0806   CHOLHDL 5.1 (H) 06/05/2019 0803   VLDL 31 11/11/2018 0937   LDLCALC 151 (H) 05/22/2023 0806   LDLCALC 131 (H) 06/05/2019 0803    Home Medications   Current Meds  Medication Sig   aspirin EC 81 MG tablet Take 81 mg by mouth daily.   carvedilol (COREG) 6.25 MG tablet Take 1 tablet (6.25 mg total) by mouth 2 (two) times daily.   lisinopril (ZESTRIL) 10 MG tablet Take 1 tablet (10 mg total) by mouth daily.   nitroGLYCERIN (NITROSTAT) 0.4 MG SL tablet Place 1 tablet (0.4 mg total) under the tongue every 5 (five) minutes x 3 doses as needed for chest pain (If no relief after 3rd dose, proceed to ED or call 911).   rosuvastatin (CRESTOR) 20 MG tablet Take 1  tablet (20 mg total) by mouth daily.    isosorbide mononitrate (IMDUR) 60 MG 24 hr tablet Take 1 tablet (60 mg total) by mouth daily.     Review of Systems    All other systems reviewed and are otherwise negative except as noted above.  Physical Exam    VS:  BP 138/88   Pulse 75   Ht 5\' 7"  (1.702 m)   Wt 174 lb 3.2 oz (79 kg)   SpO2 95%   BMI 27.28 kg/m  , BMI Body mass index is 27.28 kg/m.  Wt Readings from Last 3 Encounters:  06/03/23 174 lb 3.2 oz (79 kg)  05/17/23 169 lb 9.6 oz (76.9 kg)  11/15/22 169 lb 3.2 oz (76.7 kg)     GEN: Well nourished, well developed, in no acute distress. HEENT: normal. Neck: Supple, no JVD, carotid bruits, or masses. Cardiac: S1/S2, RRR, no murmurs, rubs, or gallops. No clubbing, cyanosis, edema.  Radials/PT 2+ and equal bilaterally.  Respiratory:  Respirations regular and unlabored, clear  to auscultation bilaterally, diminished expiratory wheezing to right middle and lower lobes posteriorly. GI: Soft, nontender, nondistended. MS: No deformity or atrophy. Skin: Warm and dry, no rash. Neuro:  Strength and sensation are intact. Psych: Normal affect.  Assessment & Plan    Multivessel CAD, s/p CABG in 2006 Admits to episode of exertional chest pain last Saturday, improvement in intensity after increasing Imdur at last office visit.  Recent Myoview was negative for any current ischemia.  Continue aspirin, carvedilol, lisinopril, rosuvastatin, and nitroglycerin as needed.  Will increase Imdur from 60 mg to 90 mg daily.  Smoking cessation encouraged and discussed.  Heart healthy diet encouraged.  ED precautions discussed.  HFmrEF Stage C, NYHA class I-II symptoms.  Reduced ejection fraction noted on recent Myoview, pending limited echocardiogram to evaluate EF.  Echocardiogram 11/23 revealed EF 50%.  Euvolemic and well compensated on exam.  Will schedule limited echocardiogram to be performed in the next few weeks.  Continue carvedilol, lisinopril,  and will increase Imdur as mentioned above. Low sodium diet, fluid restriction <2L, and daily weights encouraged. Educated to contact our office for weight gain of 2 lbs overnight or 5 lbs in one week.  At next office visit, we will discuss further titration of GDMT if EF is found to be reduced.  Mixed hyperlipidemia Recently has been switched to Crestor.  Pending labs of already been arranged.  Continue rosuvastatin since he is tolerating this well. Heart healthy diet and regular cardiovascular exercise encouraged.   4.  History of LV mural thrombus Resolved, no longer on Coumadin.  Updating limited echocardiogram as mentioned above.  5. COPD Denies any recent acute exacerbations or worsening symptoms.  No medication changes at this time.  Continue to follow-up with PCP.  6. Tobacco abuse Says he occasionally smokes cigars.  Smoking cessation encouraged and discussed.  6.  Elevated serum creatinine Most recent sCr 1.42 with eGFR 58.  Avoid nephrotoxic agents.  Encourage adequate hydration.  Has pending labs in near future arranged.  No medication changes at this time.  Continue follow-up with PCP.  7. History of CVA Denies any concerning/red flag symptoms.  Continue current medication regimen. Continue to follow with PCP. Heart healthy diet and regular cardiovascular exercise encouraged.   Disposition: Follow up in 6-8 week(s) with [name], MD or APP.  Signed, Sharlene Dory, NP 06/03/2023, 5:05 PM [facility]

## 2023-06-03 NOTE — Patient Instructions (Signed)
Medication Instructions:   Increase Imdur to 90mg  daily  Continue all other medications.     Labwork:  none  Testing/Procedures:  none  Follow-Up:  6 - 8 weeks   Any Other Special Instructions Will Be Listed Below (If Applicable).  Smoking cessation   If you need a refill on your cardiac medications before your next appointment, please call your pharmacy.

## 2023-06-24 ENCOUNTER — Other Ambulatory Visit: Payer: Self-pay

## 2023-06-24 ENCOUNTER — Observation Stay (HOSPITAL_COMMUNITY): Payer: Medicaid Other

## 2023-06-24 ENCOUNTER — Encounter (HOSPITAL_COMMUNITY): Payer: Self-pay

## 2023-06-24 ENCOUNTER — Inpatient Hospital Stay (HOSPITAL_COMMUNITY)
Admission: EM | Admit: 2023-06-24 | Discharge: 2023-06-26 | DRG: 291 | Disposition: A | Discharge status: Home / Self Care | Payer: Medicaid Other | Attending: Internal Medicine | Admitting: Internal Medicine

## 2023-06-24 ENCOUNTER — Emergency Department (HOSPITAL_COMMUNITY): Payer: Medicaid Other

## 2023-06-24 ENCOUNTER — Other Ambulatory Visit (HOSPITAL_COMMUNITY): Payer: Self-pay | Admitting: *Deleted

## 2023-06-24 DIAGNOSIS — R079 Chest pain, unspecified: Secondary | ICD-10-CM | POA: Diagnosis present

## 2023-06-24 DIAGNOSIS — Z7901 Long term (current) use of anticoagulants: Secondary | ICD-10-CM

## 2023-06-24 DIAGNOSIS — Z86718 Personal history of other venous thrombosis and embolism: Secondary | ICD-10-CM

## 2023-06-24 DIAGNOSIS — I10 Essential (primary) hypertension: Secondary | ICD-10-CM | POA: Diagnosis not present

## 2023-06-24 DIAGNOSIS — R739 Hyperglycemia, unspecified: Secondary | ICD-10-CM | POA: Diagnosis present

## 2023-06-24 DIAGNOSIS — E7849 Other hyperlipidemia: Secondary | ICD-10-CM | POA: Diagnosis not present

## 2023-06-24 DIAGNOSIS — I513 Intracardiac thrombosis, not elsewhere classified: Secondary | ICD-10-CM | POA: Diagnosis present

## 2023-06-24 DIAGNOSIS — R202 Paresthesia of skin: Secondary | ICD-10-CM | POA: Diagnosis present

## 2023-06-24 DIAGNOSIS — Z7982 Long term (current) use of aspirin: Secondary | ICD-10-CM

## 2023-06-24 DIAGNOSIS — Z720 Tobacco use: Secondary | ICD-10-CM | POA: Diagnosis present

## 2023-06-24 DIAGNOSIS — E1122 Type 2 diabetes mellitus with diabetic chronic kidney disease: Secondary | ICD-10-CM | POA: Diagnosis present

## 2023-06-24 DIAGNOSIS — Z833 Family history of diabetes mellitus: Secondary | ICD-10-CM

## 2023-06-24 DIAGNOSIS — I25118 Atherosclerotic heart disease of native coronary artery with other forms of angina pectoris: Secondary | ICD-10-CM | POA: Diagnosis present

## 2023-06-24 DIAGNOSIS — Z96643 Presence of artificial hip joint, bilateral: Secondary | ICD-10-CM | POA: Diagnosis present

## 2023-06-24 DIAGNOSIS — J449 Chronic obstructive pulmonary disease, unspecified: Secondary | ICD-10-CM | POA: Diagnosis present

## 2023-06-24 DIAGNOSIS — E119 Type 2 diabetes mellitus without complications: Secondary | ICD-10-CM

## 2023-06-24 DIAGNOSIS — G4440 Drug-induced headache, not elsewhere classified, not intractable: Secondary | ICD-10-CM | POA: Diagnosis present

## 2023-06-24 DIAGNOSIS — J439 Emphysema, unspecified: Secondary | ICD-10-CM

## 2023-06-24 DIAGNOSIS — I509 Heart failure, unspecified: Secondary | ICD-10-CM

## 2023-06-24 DIAGNOSIS — I25119 Atherosclerotic heart disease of native coronary artery with unspecified angina pectoris: Secondary | ICD-10-CM | POA: Diagnosis not present

## 2023-06-24 DIAGNOSIS — T463X5A Adverse effect of coronary vasodilators, initial encounter: Secondary | ICD-10-CM | POA: Diagnosis present

## 2023-06-24 DIAGNOSIS — E782 Mixed hyperlipidemia: Secondary | ICD-10-CM | POA: Diagnosis present

## 2023-06-24 DIAGNOSIS — Z87891 Personal history of nicotine dependence: Secondary | ICD-10-CM

## 2023-06-24 DIAGNOSIS — Z888 Allergy status to other drugs, medicaments and biological substances status: Secondary | ICD-10-CM

## 2023-06-24 DIAGNOSIS — F32A Depression, unspecified: Secondary | ICD-10-CM | POA: Diagnosis present

## 2023-06-24 DIAGNOSIS — E1100 Type 2 diabetes mellitus with hyperosmolarity without nonketotic hyperglycemic-hyperosmolar coma (NKHHC): Principal | ICD-10-CM

## 2023-06-24 DIAGNOSIS — Z951 Presence of aortocoronary bypass graft: Secondary | ICD-10-CM

## 2023-06-24 DIAGNOSIS — I255 Ischemic cardiomyopathy: Secondary | ICD-10-CM | POA: Diagnosis present

## 2023-06-24 DIAGNOSIS — I5033 Acute on chronic diastolic (congestive) heart failure: Secondary | ICD-10-CM | POA: Diagnosis present

## 2023-06-24 DIAGNOSIS — Z79899 Other long term (current) drug therapy: Secondary | ICD-10-CM

## 2023-06-24 DIAGNOSIS — E1165 Type 2 diabetes mellitus with hyperglycemia: Secondary | ICD-10-CM | POA: Diagnosis present

## 2023-06-24 DIAGNOSIS — Z8673 Personal history of transient ischemic attack (TIA), and cerebral infarction without residual deficits: Secondary | ICD-10-CM

## 2023-06-24 DIAGNOSIS — N182 Chronic kidney disease, stage 2 (mild): Secondary | ICD-10-CM | POA: Diagnosis present

## 2023-06-24 DIAGNOSIS — F1290 Cannabis use, unspecified, uncomplicated: Secondary | ICD-10-CM | POA: Diagnosis not present

## 2023-06-24 DIAGNOSIS — G250 Essential tremor: Secondary | ICD-10-CM | POA: Diagnosis present

## 2023-06-24 DIAGNOSIS — I130 Hypertensive heart and chronic kidney disease with heart failure and stage 1 through stage 4 chronic kidney disease, or unspecified chronic kidney disease: Principal | ICD-10-CM | POA: Diagnosis present

## 2023-06-24 DIAGNOSIS — Z8249 Family history of ischemic heart disease and other diseases of the circulatory system: Secondary | ICD-10-CM

## 2023-06-24 HISTORY — DX: Type 2 diabetes mellitus without complications: E11.9

## 2023-06-24 LAB — CBC WITH DIFFERENTIAL/PLATELET
Abs Immature Granulocytes: 0.02 10*3/uL (ref 0.00–0.07)
Basophils Absolute: 0.1 10*3/uL (ref 0.0–0.1)
Basophils Relative: 1 %
Eosinophils Absolute: 0.2 10*3/uL (ref 0.0–0.5)
Eosinophils Relative: 3 %
HCT: 45.7 % (ref 39.0–52.0)
Hemoglobin: 15.3 g/dL (ref 13.0–17.0)
Immature Granulocytes: 0 %
Lymphocytes Relative: 34 %
Lymphs Abs: 1.9 10*3/uL (ref 0.7–4.0)
MCH: 30.8 pg (ref 26.0–34.0)
MCHC: 33.5 g/dL (ref 30.0–36.0)
MCV: 92.1 fL (ref 80.0–100.0)
Monocytes Absolute: 0.5 10*3/uL (ref 0.1–1.0)
Monocytes Relative: 9 %
Neutro Abs: 2.9 10*3/uL (ref 1.7–7.7)
Neutrophils Relative %: 53 %
Platelets: 192 10*3/uL (ref 150–400)
RBC: 4.96 MIL/uL (ref 4.22–5.81)
RDW: 13.2 % (ref 11.5–15.5)
WBC: 5.7 10*3/uL (ref 4.0–10.5)
nRBC: 0 % (ref 0.0–0.2)

## 2023-06-24 LAB — BASIC METABOLIC PANEL
Anion gap: 6 (ref 5–15)
BUN: 20 mg/dL (ref 6–20)
CO2: 22 mmol/L (ref 22–32)
Calcium: 8.9 mg/dL (ref 8.9–10.3)
Chloride: 107 mmol/L (ref 98–111)
Creatinine, Ser: 0.99 mg/dL (ref 0.61–1.24)
GFR, Estimated: >60 mL/min (ref >60)
Glucose, Bld: 178 mg/dL — ABNORMAL HIGH (ref 70–99)
Potassium: 3.8 mmol/L (ref 3.5–5.1)
Sodium: 135 mmol/L (ref 135–145)

## 2023-06-24 LAB — HEPATIC FUNCTION PANEL
ALT: 40 U/L (ref 0–44)
AST: 25 U/L (ref 15–41)
Albumin: 4 g/dL (ref 3.5–5.0)
Alkaline Phosphatase: 77 U/L (ref 38–126)
Bilirubin, Direct: <0.1 mg/dL (ref 0.0–0.2)
Total Bilirubin: 0.4 mg/dL (ref 0.3–1.2)
Total Protein: 7.2 g/dL (ref 6.5–8.1)

## 2023-06-24 LAB — I-STAT CHEM 8, ED
BUN: 22 mg/dL — ABNORMAL HIGH (ref 6–20)
Calcium, Ion: 1.24 mmol/L (ref 1.15–1.40)
Chloride: 109 mmol/L (ref 98–111)
Creatinine, Ser: 1 mg/dL (ref 0.61–1.24)
Glucose, Bld: 168 mg/dL — ABNORMAL HIGH (ref 70–99)
HCT: 46 % (ref 39.0–52.0)
Hemoglobin: 15.6 g/dL (ref 13.0–17.0)
Potassium: 3.9 mmol/L (ref 3.5–5.1)
Sodium: 140 mmol/L (ref 135–145)
TCO2: 20 mmol/L — ABNORMAL LOW (ref 22–32)

## 2023-06-24 LAB — HIV ANTIBODY (ROUTINE TESTING W REFLEX): HIV Screen 4th Generation wRfx: NONREACTIVE

## 2023-06-24 LAB — ECHOCARDIOGRAM COMPLETE
Area-P 1/2: 4.21 cm2
Est EF: 50
Height: 67 in
S' Lateral: 4 cm
Weight: 2787.21 oz

## 2023-06-24 LAB — TROPONIN I (HIGH SENSITIVITY)
Troponin I (High Sensitivity): 30 ng/L — ABNORMAL HIGH (ref <18)
Troponin I (High Sensitivity): 24 ng/L — ABNORMAL HIGH (ref <18)

## 2023-06-24 LAB — GLUCOSE, CAPILLARY
Glucose-Capillary: 185 mg/dL — ABNORMAL HIGH (ref 70–99)
Glucose-Capillary: 140 mg/dL — ABNORMAL HIGH (ref 70–99)

## 2023-06-24 LAB — HEMOGLOBIN A1C
Hgb A1c MFr Bld: 7.4 % — ABNORMAL HIGH (ref 4.8–5.6)
Mean Plasma Glucose: 165.68 mg/dL

## 2023-06-24 LAB — LIPASE, BLOOD: Lipase: 38 U/L (ref 11–51)

## 2023-06-24 LAB — MAGNESIUM: Magnesium: 1.9 mg/dL (ref 1.7–2.4)

## 2023-06-24 LAB — CBG MONITORING, ED: Glucose-Capillary: 177 mg/dL — ABNORMAL HIGH (ref 70–99)

## 2023-06-24 LAB — TSH: TSH: 1.559 u[IU]/mL (ref 0.350–4.500)

## 2023-06-24 LAB — BRAIN NATRIURETIC PEPTIDE: B Natriuretic Peptide: 72 pg/mL (ref 0.0–100.0)

## 2023-06-24 MED ORDER — NICOTINE 21 MG/24HR TD PT24
21.0000 mg | MEDICATED_PATCH | Freq: Every day | TRANSDERMAL | Status: DC | PRN
  Administered 2023-06-24: 21 mg via TRANSDERMAL
  Filled 2023-06-24: qty 1

## 2023-06-24 MED ORDER — ASPIRIN 81 MG PO TBEC
81.0000 mg | DELAYED_RELEASE_TABLET | Freq: Every day | ORAL | Status: DC
  Administered 2023-06-25 – 2023-06-26 (×2): 81 mg via ORAL
  Filled 2023-06-24 (×2): qty 1

## 2023-06-24 MED ORDER — MORPHINE SULFATE (PF) 2 MG/ML IV SOLN: 1.0000 mg | INTRAVENOUS | Status: DC | PRN

## 2023-06-24 MED ORDER — ASPIRIN 81 MG PO CHEW
243.0000 mg | CHEWABLE_TABLET | Freq: Once | ORAL | Status: AC
  Administered 2023-06-24: 243 mg via ORAL
  Filled 2023-06-24: qty 3

## 2023-06-24 MED ORDER — SENNOSIDES-DOCUSATE SODIUM 8.6-50 MG PO TABS: 1.0000 | ORAL_TABLET | Freq: Every evening | ORAL | Status: DC | PRN

## 2023-06-24 MED ORDER — INSULIN ASPART 100 UNIT/ML IJ SOLN
0.0000 [IU] | Freq: Three times a day (TID) | INTRAMUSCULAR | Status: DC
  Administered 2023-06-24: 2 [IU] via SUBCUTANEOUS
  Administered 2023-06-25: 1 [IU] via SUBCUTANEOUS
  Administered 2023-06-25: 2 [IU] via SUBCUTANEOUS

## 2023-06-24 MED ORDER — TRAZODONE HCL 50 MG PO TABS: 25.0000 mg | ORAL_TABLET | Freq: Every evening | ORAL | Status: DC | PRN

## 2023-06-24 MED ORDER — ACETAMINOPHEN 325 MG PO TABS: 650.0000 mg | ORAL_TABLET | Freq: Four times a day (QID) | ORAL | Status: DC | PRN

## 2023-06-24 MED ORDER — CARVEDILOL 3.125 MG PO TABS
6.2500 mg | ORAL_TABLET | Freq: Two times a day (BID) | ORAL | Status: DC
  Administered 2023-06-25 – 2023-06-26 (×3): 6.25 mg via ORAL
  Filled 2023-06-24 (×4): qty 2

## 2023-06-24 MED ORDER — ISOSORBIDE MONONITRATE ER 60 MG PO TB24
60.0000 mg | ORAL_TABLET | Freq: Every day | ORAL | Status: DC
  Administered 2023-06-25 – 2023-06-26 (×2): 60 mg via ORAL
  Filled 2023-06-24 (×2): qty 1

## 2023-06-24 MED ORDER — ACETAMINOPHEN 650 MG RE SUPP: 650.0000 mg | Freq: Four times a day (QID) | RECTAL | Status: DC | PRN

## 2023-06-24 MED ORDER — OXYCODONE HCL 5 MG PO TABS: 5.0000 mg | ORAL_TABLET | ORAL | Status: DC | PRN

## 2023-06-24 MED ORDER — RANOLAZINE ER 500 MG PO TB12
500.0000 mg | ORAL_TABLET | Freq: Two times a day (BID) | ORAL | Status: DC
  Administered 2023-06-24 – 2023-06-26 (×5): 500 mg via ORAL
  Filled 2023-06-24 (×5): qty 1

## 2023-06-24 MED ORDER — ISOSORBIDE MONONITRATE ER 60 MG PO TB24: 90.0000 mg | ORAL_TABLET | Freq: Every day | ORAL | Status: DC

## 2023-06-24 MED ORDER — LISINOPRIL 10 MG PO TABS
10.0000 mg | ORAL_TABLET | Freq: Every day | ORAL | Status: DC
  Administered 2023-06-25 – 2023-06-26 (×2): 10 mg via ORAL
  Filled 2023-06-24 (×2): qty 1

## 2023-06-24 MED ORDER — ONDANSETRON HCL 4 MG PO TABS: 4.0000 mg | ORAL_TABLET | Freq: Four times a day (QID) | ORAL | Status: DC | PRN

## 2023-06-24 MED ORDER — HEPARIN SODIUM (PORCINE) 5000 UNIT/ML IJ SOLN
5000.0000 [IU] | Freq: Three times a day (TID) | INTRAMUSCULAR | Status: DC
  Administered 2023-06-24 – 2023-06-26 (×5): 5000 [IU] via SUBCUTANEOUS
  Filled 2023-06-24 (×5): qty 1

## 2023-06-24 MED ORDER — NITROGLYCERIN 2 % TD OINT
1.0000 [in_us] | TOPICAL_OINTMENT | Freq: Once | TRANSDERMAL | Status: AC
  Administered 2023-06-24: 1 [in_us] via TOPICAL
  Filled 2023-06-24: qty 1

## 2023-06-24 MED ORDER — PERFLUTREN LIPID MICROSPHERE
1.0000 mL | INTRAVENOUS | Status: AC | PRN
  Administered 2023-06-24: 4 mL via INTRAVENOUS

## 2023-06-24 MED ORDER — ONDANSETRON HCL 4 MG/2ML IJ SOLN: 4.0000 mg | Freq: Four times a day (QID) | INTRAMUSCULAR | Status: DC | PRN

## 2023-06-24 MED ORDER — ROSUVASTATIN CALCIUM 20 MG PO TABS
20.0000 mg | ORAL_TABLET | Freq: Every evening | ORAL | Status: DC
  Administered 2023-06-24 – 2023-06-25 (×2): 20 mg via ORAL
  Filled 2023-06-24 (×2): qty 1

## 2023-06-24 MED ORDER — NITROGLYCERIN 0.4 MG SL SUBL: 0.4000 mg | SUBLINGUAL_TABLET | SUBLINGUAL | Status: DC | PRN

## 2023-06-24 MED ORDER — FENTANYL CITRATE PF 50 MCG/ML IJ SOSY
50.0000 ug | PREFILLED_SYRINGE | Freq: Once | INTRAMUSCULAR | Status: AC
  Administered 2023-06-24: 50 ug via INTRAVENOUS
  Filled 2023-06-24: qty 1

## 2023-06-24 NOTE — ED Provider Notes (Signed)
[place] EMERGENCY DEPARTMENT AT [facility] Provider Note   CSN: [id number] Arrival date & time: 06/24/23  0759     History  Chief Complaint  Patient presents with   Chest Pain    Steve Vang is a 57 y.o. male.  HPI Patient presents for chest pain.  Medical history includes HLD, HTN, CAD, see PA, COPD, CKD.  He is followed by [facility].  His cardiologist is Dr. Diona Browner.  He underwent CABG in 2006.  He had a stress test in May which was intermediate to high risk.  This morning, he woke up in his normal state of health.  At around 6:30 AM, he experienced pain, initially in left shoulder.  Pain subsequently spread throughout his anterior chest.  Endorses some paresthesias to his left arm.  He took an 81 mg aspirin this morning as well as his Imdur.  He took 1 SL NTG which did improve his symptoms.  Currently, he has ongoing mild diffuse anterior chest discomfort.    Home Medications Prior to Admission medications   Medication Sig Start Date End Date Taking? Authorizing Provider  aspirin EC 81 MG tablet Take 81 mg by mouth daily.   Yes [provider]  carvedilol (COREG) 6.25 MG tablet Take 1 tablet (6.25 mg total) by mouth 2 (two) times daily. 05/17/23  Yes Jonelle Sidle, MD  isosorbide mononitrate (IMDUR) 60 MG 24 hr tablet Take 1.5 tablets (90 mg total) by mouth daily. 06/03/23 09/01/23 Yes Sharlene Dory, NP  lisinopril (ZESTRIL) 10 MG tablet Take 1 tablet (10 mg total) by mouth daily. 05/17/23  Yes Jonelle Sidle, MD  nitroGLYCERIN (NITROSTAT) 0.4 MG SL tablet Place 1 tablet (0.4 mg total) under the tongue every 5 (five) minutes x 3 doses as needed for chest pain (If no relief after 3rd dose, proceed to ED or call 911). 05/17/23  Yes Jonelle Sidle, MD  rosuvastatin (CRESTOR) 20 MG tablet Take 1 tablet (20 mg total) by mouth daily. 05/29/23  Yes Jonelle Sidle, MD      Allergies    Benadryl [diphenhydramine] and Chantix [varenicline]    Review of  Systems   Review of Systems  Cardiovascular:  Positive for chest pain.  All other systems reviewed and are negative.   Physical Exam Updated Vital Signs BP 136/84 (BP Location: Left Arm)   Pulse (!) 55   Temp 97.6 F (36.4 C) (Oral)   Resp 18   Ht 5\' 7"  (1.702 m)   Wt 76.1 kg   SpO2 95%   BMI 26.28 kg/m  Physical Exam Vitals and nursing note reviewed.  Constitutional:      General: He is not in acute distress.    Appearance: Normal appearance. He is well-developed. He is not ill-appearing, toxic-appearing or diaphoretic.  HENT:     Head: Normocephalic and atraumatic.     Right Ear: External ear normal.     Left Ear: External ear normal.     Nose: Nose normal.  Eyes:     Extraocular Movements: Extraocular movements intact.     Conjunctiva/sclera: Conjunctivae normal.  Cardiovascular:     Rate and Rhythm: Normal rate and regular rhythm.     Heart sounds: No murmur heard. Pulmonary:     Effort: Pulmonary effort is normal. No respiratory distress.     Breath sounds: Normal breath sounds. No wheezing or rales.  Chest:     Chest wall: No tenderness.  Abdominal:  General: There is no distension.     Palpations: Abdomen is soft.     Tenderness: There is no abdominal tenderness.  Musculoskeletal:        General: No swelling. Normal range of motion.     Cervical back: Normal range of motion and neck supple.  Skin:    General: Skin is warm and dry.     Coloration: Skin is not jaundiced or pale.  Neurological:     General: No focal deficit present.     Mental Status: He is alert and oriented to person, place, and time.  Psychiatric:        Mood and Affect: Mood normal.        Behavior: Behavior normal.     ED Results / Procedures / Treatments   Labs (all labs ordered are listed, but only abnormal results are displayed) Labs Reviewed  BASIC METABOLIC PANEL - Abnormal; Notable for the following components:      Result Value   Glucose, Bld 178 (*)    All other  components within normal limits  CBG MONITORING, ED - Abnormal; Notable for the following components:   Glucose-Capillary 177 (*)    All other components within normal limits  I-STAT CHEM 8, ED - Abnormal; Notable for the following components:   BUN 22 (*)    Glucose, Bld 168 (*)    TCO2 20 (*)    All other components within normal limits  TROPONIN I (HIGH SENSITIVITY) - Abnormal; Notable for the following components:   Troponin I (High Sensitivity) 30 (*)    All other components within normal limits  TROPONIN I (HIGH SENSITIVITY) - Abnormal; Notable for the following components:   Troponin I (High Sensitivity) 24 (*)    All other components within normal limits  MAGNESIUM  LIPASE, BLOOD  CBC WITH DIFFERENTIAL/PLATELET  HEPATIC FUNCTION PANEL  BRAIN NATRIURETIC PEPTIDE  TSH  HEMOGLOBIN A1C  RAPID URINE DRUG SCREEN, HOSP PERFORMED  HIV ANTIBODY (ROUTINE TESTING W REFLEX)    EKG EKG Interpretation  Date/Time:  Monday June 24 2023 08:15:22 EDT Ventricular Rate:  59 PR Interval:  182 QRS Duration: 105 QT Interval:  425 QTC Calculation: 421 R Axis:   60 Text Interpretation: Sinus rhythm LVH with secondary repolarization abnormality Probable lateral infarct, age indeterminate Anterior infarct, old Confirmed by Gloris Manchester [phone]) on 06/24/2023 8:19:57 AM  Radiology DG Chest Portable 1 View  Result Date: 06/24/2023 CLINICAL DATA:  Chest pain. EXAM: PORTABLE CHEST 1 VIEW COMPARISON:  Chest x-ray May 10, 2020. FINDINGS: The heart size and mediastinal contours are within normal limits. Both lungs are clear. No visible pleural effusions or pneumothorax. No acute osseous abnormality. Median sternotomy. IMPRESSION: No active disease. Electronically Signed   By: Feliberto Harts M.D.   On: 06/24/2023 08:49    Procedures Procedures    Medications Ordered in ED Medications  aspirin EC tablet 81 mg (has no administration in time range)  carvedilol (COREG) tablet 6.25 mg (has no  administration in time range)  lisinopril (ZESTRIL) tablet 10 mg (has no administration in time range)  nitroGLYCERIN (NITROSTAT) SL tablet 0.4 mg (has no administration in time range)  rosuvastatin (CRESTOR) tablet 20 mg (has no administration in time range)  insulin aspart (novoLOG) injection 0-9 Units (has no administration in time range)  heparin injection 5,000 Units (has no administration in time range)  acetaminophen (TYLENOL) tablet 650 mg (has no administration in time range)    Or  acetaminophen (TYLENOL) suppository 650  mg (has no administration in time range)  oxyCODONE (Oxy IR/ROXICODONE) immediate release tablet 5 mg (has no administration in time range)  morphine (PF) 2 MG/ML injection 1 mg (has no administration in time range)  traZODone (DESYREL) tablet 25 mg (has no administration in time range)  senna-docusate (Senokot-S) tablet 1 tablet (has no administration in time range)  ondansetron (ZOFRAN) tablet 4 mg (has no administration in time range)    Or  ondansetron (ZOFRAN) injection 4 mg (has no administration in time range)  nicotine (NICODERM CQ - dosed in mg/24 hours) patch 21 mg (has no administration in time range)  isosorbide mononitrate (IMDUR) 24 hr tablet 60 mg (has no administration in time range)  ranolazine (RANEXA) 12 hr tablet 500 mg (has no administration in time range)  perflutren lipid microspheres (DEFINITY) IV suspension (4 mLs Intravenous Given 06/24/23 1353)  aspirin chewable tablet 243 mg (243 mg Oral Given 06/24/23 0835)  nitroGLYCERIN (NITROGLYN) 2 % ointment 1 inch (1 inch Topical Given 06/24/23 0833)  fentaNYL (SUBLIMAZE) injection 50 mcg (50 mcg Intravenous Given 06/24/23 3016)    ED Course/ Medical Decision Making/ A&P                             Medical Decision Making Amount and/or Complexity of Data Reviewed Labs: ordered. Radiology: ordered.  Risk OTC drugs. Prescription drug management. Decision regarding hospitalization.   This  patient presents to the ED for concern of chest pain, this involves an extensive number of treatment options, and is a complaint that carries with it a high risk of complications and morbidity.  The differential diagnosis includes ACS, carditis, GERD, pneumonia, CHF   Co morbidities that complicate the patient evaluation  HLD, HTN, CAD, see PA, COPD, CKD   Additional history obtained:  Additional history obtained from an N/A External records from outside source obtained and reviewed including EMR   Lab Tests:  I Ordered, and personally interpreted labs.  The pertinent results include: Normal hemoglobin, no leukocytosis, normal electrolytes, normal BNP, slight elevation in troponin consistent with prior lab work and stable repeat   Imaging Studies ordered:  I ordered imaging studies including chest x-ray I independently visualized and interpreted imaging which showed no acute findings I agree with the radiologist interpretation   Cardiac Monitoring: / EKG:  The patient was maintained on a cardiac monitor.  I personally viewed and interpreted the cardiac monitored which showed an underlying rhythm of: Sinus rhythm   Consultations Obtained:  I requested consultation with the cardiologist, Dr. Jenene Slicker,  and discussed lab and imaging findings as well as pertinent plan - they recommend: Admission for admission, optimization of medications and echocardiogram   Problem List / ED Course / Critical interventions / Medication management  Patient presenting for chest pain.  Onset was this morning, approximately 1.5 hours prior to arrival.  He has a history of known CAD and did undergo a recent intermediate to high risk stress test 1 month ago.  Prior to arrival, patient took 81 mg aspirin, Imdur, and 1 SL NTG.  On arrival, he is alert and oriented.  He does endorse ongoing discomfort to his anterior chest.  Vital signs on arrival are normal.  EKG shows no concerning ST segment  elevations.  ASA, NTG, and fentanyl were ordered.  Workup was initiated.  Initial troponin is 30.  This is consistent with his prior lab work.  On reassessment, he is now pain-free.  Given  his recent stress test, I did discuss with cardiologist on-call, who came and evaluated the patient at bedside.  She does recommend admission for medication optimization, observation, and echocardiogram.  Repeat troponin was 24.  This is not consistent with NSTEMI.  Patient was admitted to medicine for further management. I ordered medication including NTG, ASA, and fentanyl for chest pain Reevaluation of the patient after these medicines showed that the patient resolved I have reviewed the patients home medicines and have made adjustments as needed   Social Determinants of Health:  Has access to outpatient care        Final Clinical Impression(s) / ED Diagnoses Final diagnoses:  Chest pain, unspecified type    Rx / DC Orders ED Discharge Orders     None         Gloris Manchester, MD 06/24/23 1626

## 2023-06-24 NOTE — Consult Note (Signed)
CARDIOLOGY CONSULT NOTE    Patient ID: Steve Vang; [id number]; 04/09/1966   Admit date: 06/24/2023 Date of Consult: 06/24/2023  Primary Care Provider: Patient, No Pcp Per Primary Cardiologist:  Primary Electrophysiologist:     Patient Profile:   Steve Vang is a 57 y.o. male  who is being seen today for the evaluation of chest pain at the request of Dr Durwin Nora.  History of Present Illness:   Steve Vang is a 57 y/o M known to have CAD s/p CABG in 2006 with LVEF 50%, history of LV thrombus, history of CVA, CKD, HTN, HLD presented to the ER with chest pain.  Patient had CABG in 2006 and has not had any chest pains until couple of years ago when he started to have 1 episode every 3 months and it is not until couple of months ago, he started to have frequent chest pains, 2 times in a month. He has substernal chest pain radiating to the left arm with exertion and resolves with rest. This happens only with strenuous exercise.  He is physically active at baseline, can perform his also chores, climb stairs, swim with no symptoms of angina. This morning, after he woke up, he started to have chest pain radiating to the left arm and lasted for 1 hour. EKG showed NSR, no new ischemia. High sensitivity troponins were mildly elevated, 24 and 30 (similar to 3 years ago).  Chest pain-free upon arrival to the ER.  He did take sublingual nitroglycerin which partially improved the pain.  He also reported that after increasing the Imdur dose from 60 mg to 90 mg, he has been having severe headaches.  Past Medical History:  Diagnosis Date   CAD (coronary artery disease)    Multivessel, LVEF 45-50%   Cardiomyopathy (HCC)    LVEF 50-55% December 2016   CKD (chronic kidney disease) stage 2, GFR 60-89 ml/min    COPD (chronic obstructive pulmonary disease) (HCC)    Depression    Essential hypertension    Falls    Gallstone    Gastroenteritis    History of stroke 2004   Hyperlipidemia    Left ventricular  mural thrombus    On Coumadin   Stroke [facility])     Past Surgical History:  Procedure Laterality Date   CORONARY ARTERY BYPASS GRAFT     DOR anterior ventricular restoration surgery 8/06, [facility] - LIMA to first diagonal, SVG to PLB, SVG to RVE branch of nondominant RCA   EYE SURGERY     TOTAL HIP ARTHROPLASTY Left 06/08/2016   Procedure: TOTAL HIP ARTHROPLASTY ANTERIOR APPROACH;  Surgeon: Kathryne Hitch, MD;  Location: [facility];  Service: Orthopedics;  Laterality: Left;   TOTAL HIP ARTHROPLASTY Right 07/20/2016   Procedure: RIGHT TOTAL HIP ARTHROPLASTY ANTERIOR APPROACH;  Surgeon: Kathryne Hitch, MD;  Location: [facility];  Service: Orthopedics;  Laterality: Right;    Inpatient Medications: Scheduled Meds:  [START ON 06/25/2023] aspirin EC  81 mg Oral Daily   carvedilol  6.25 mg Oral BID   heparin  5,000 Units Subcutaneous Q8H   insulin aspart  0-9 Units Subcutaneous TID WC   [START ON 06/25/2023] isosorbide mononitrate  90 mg Oral Daily   [START ON 06/25/2023] lisinopril  10 mg Oral Daily   rosuvastatin  20 mg Oral QPM   Continuous Infusions:  PRN Meds: acetaminophen **OR** acetaminophen, morphine injection, nicotine, nitroGLYCERIN, ondansetron **OR** ondansetron (ZOFRAN) IV, oxyCODONE, senna-docusate, traZODone  Allergies:  Allergies  Allergen Reactions   Benadryl [Diphenhydramine] Hives and Swelling   Chantix [Varenicline] Nausea And Vomiting    Social History:   Social History   Socioeconomic History   Marital status: Married    Spouse name: Not on file   Number of children: Not on file   Years of education: Not on file   Highest education level: Not on file  Occupational History   Not on file  Tobacco Use   Smoking status: Former    Packs/day: 1.00    Years: 30.00    Additional pack years: 0.00    Total pack years: 30.00    Types: Cigarettes    Quit date: 2022    Years since quitting: 2.4   Smokeless tobacco: Never  Vaping Use   Vaping  Use: Never used  Substance and Sexual Activity   Alcohol use: No    Alcohol/week: 0.0 standard drinks of alcohol    Comment: hx of alcohol use    Drug use: Not Currently    Types: Marijuana    Comment:  marijuana use   Sexual activity: Yes    Partners: Female  Other Topics Concern   Not on file  Social History Narrative   Not on file   Social Determinants of Health   Financial Resource Strain: Not on file  Food Insecurity: Not on file  Transportation Needs: Not on file  Physical Activity: Not on file  Stress: Not on file  Social Connections: Not on file  Intimate Partner Violence: Not on file    Family History:    Family History  Problem Relation Age of Onset   Hypertension Mother    Diabetes Mother      ROS:  Please see the history of present illness.  ROS  All other ROS reviewed and negative.     Physical Exam/Data:   Vitals:   06/24/23 1000 06/24/23 1015 06/24/23 1115 06/24/23 1215  BP: 112/70 104/66 115/77 126/77  Pulse: (!) 58 (!) 50 (!) 51 (!) 55  Resp: 19 (!) 22 18 (!) 22  Temp:      TempSrc:      SpO2: 94% 98% 94% 94%  Weight:      Height:       No intake or output data in the 24 hours ending 06/24/23 1321 Filed Weights   06/24/23 0815  Weight: 79 kg   Body mass index is 27.28 kg/m.  General:  Well nourished, well developed, in no acute distress HEENT: normal Lymph: no adenopathy Neck: no JVD Endocrine:  No thryomegaly Vascular: No carotid bruits; FA pulses 2+ bilaterally without bruits  Cardiac:  normal S1, S2; RRR; no murmur  Lungs:  clear to auscultation bilaterally, no wheezing, rhonchi or rales  Abd: soft, nontender, no hepatomegaly  Ext: no edema Musculoskeletal:  No deformities, BUE and BLE strength normal and equal Skin: warm and dry  Neuro:  CNs 2-12 intact, no focal abnormalities noted Psych:  Normal affect   EKG:  The EKG was personally reviewed and demonstrates:   Telemetry:  Telemetry was personally reviewed and  demonstrates:    Relevant CV Studies:   Laboratory Data:  Chemistry Recent Labs  Lab 06/24/23 0825 06/24/23 0858  NA 135 140  K 3.8 3.9  CL 107 109  CO2 22  --   GLUCOSE 178* 168*  BUN 20 22*  CREATININE 0.99 1.00  CALCIUM 8.9  --   GFRNONAA >60  --   ANIONGAP 6  --  Recent Labs  Lab 06/24/23 0825  PROT 7.2  ALBUMIN 4.0  AST 25  ALT 40  ALKPHOS 77  BILITOT 0.4   Hematology Recent Labs  Lab 06/24/23 0825 06/24/23 0858  WBC 5.7  --   RBC 4.96  --   HGB 15.3 15.6  HCT 45.7 46.0  MCV 92.1  --   MCH 30.8  --   MCHC 33.5  --   RDW 13.2  --   PLT 192  --    Cardiac EnzymesNo results for input(s): "TROPONINI" in the last 168 hours. No results for input(s): "TROPIPOC" in the last 168 hours.  BNPNo results for input(s): "BNP", "PROBNP" in the last 168 hours.  DDimer No results for input(s): "DDIMER" in the last 168 hours.  Radiology/Studies:  DG Chest Portable 1 View  Result Date: 06/24/2023 CLINICAL DATA:  Chest pain. EXAM: PORTABLE CHEST 1 VIEW COMPARISON:  Chest x-ray May 10, 2020. FINDINGS: The heart size and mediastinal contours are within normal limits. Both lungs are clear. No visible pleural effusions or pneumothorax. No acute osseous abnormality. Median sternotomy. IMPRESSION: No active disease. Electronically Signed   By: Feliberto Harts M.D.   On: 06/24/2023 08:49    Assessment and Plan:   Patient  is a 57 y/o M known to have CAD s/p CABG in 2006 with LVEF 50%, history of LV thrombus, history of CVA, CKD, HTN, HLD presented to the ER with chest pain.   # CAD s/p CABG in 2006 with progressive angina/stable angina -Patient was chest pain free until couple of years ago when his chest pains were occurring once in 3 months. However, a couple of months ago, he started to have more frequent chest pains, 2 times per month only with strenuous exercise. Otherwise, he is physically active at baseline, can perform his household chores, swim and climb stairs  with no symptoms of angina.  EKG showed NSR, no new ischemia.  High-sensitivity troponins were mildly elevated, 24 and 30 (similar to values from 3 years ago).  NM stress test from 5/24 showed a large extensive apical infarction and no evidence of ischemia.  NM LVEF was 35%, will obtain 2D echocardiogram and start GDMT if required.  Otherwise, will uptitrate/optimize antianginal therapy, start ranolazine 500 mg twice daily, decrease Imdur from 90 mg to 60 mg once daily (due to severe headaches after the dose increase), continue carvedilol 6.25 mg twice daily. -Continue cardioprotective medications, aspirin 81 mg once daily, rosuvastatin 20 mg nightly.  # HTN, continue above medications # HLD, continue rosuvastatin 20 mg nightly.  Goal LDL less than 70.   For questions or updates, please contact [facility] Please consult [email or web address] for contact info under Cardiology/STEMI.   Signed, Herbert Deaner, MD 06/24/2023 1:21 PM

## 2023-06-24 NOTE — H&P (Signed)
History and Physical  [facility]  HOSAM MCFETRIDGE ZOX:[id number] DOB: 1966-12-03 DOA: 06/24/2023   Cardiologist: Diona Browner   Patient coming from: Home  Level of care: Telemetry  I have personally briefly reviewed patient's old medical records in [facility]  Chief Complaint: chest pain   HPI: Steve Vang is a 57 year old gentleman smoker with significant coronary artery disease status post CABG in 2006, history of LV thrombus previously had been fully anticoagulated with warfarin, hyperlipidemia, history of CVA, ischemic cardiomyopathy, CKD, hypertension presented to the emergency department complaining of substernal chest pain radiating to left arm with exertion and resolving at rest.  Symptoms started this morning after waking up.  He took nitroglycerin at home and was chest pain-free when he arrived.  He had a recent stress test on 2424 that showed a large extensive apical infarction and no evidence of ischemia.  The LVEF was noted to be 35%.  His medications were adjusted and his Imdur was increased to 90 mg.  Patient reports that he has had terrible headaches since taking the higher dose of Imdur.  The ED provider consulted cardiology who recommended that patient be admitted for observation and further medical management.    Past Medical History:  Diagnosis Date   CAD (coronary artery disease)    Multivessel, LVEF 45-50%   Cardiomyopathy (HCC)    LVEF 50-55% December 2016   CKD (chronic kidney disease) stage 2, GFR 60-89 ml/min    COPD (chronic obstructive pulmonary disease) (HCC)    Depression    Essential hypertension    Falls    Gallstone    Gastroenteritis    History of stroke 2004   Hyperlipidemia    Left ventricular mural thrombus    On Coumadin   Stroke [facility])     Past Surgical History:  Procedure Laterality Date   CORONARY ARTERY BYPASS GRAFT     DOR anterior ventricular restoration surgery 8/06, [facility] - LIMA to first diagonal, SVG to PLB, SVG  to RVE branch of nondominant RCA   EYE SURGERY     TOTAL HIP ARTHROPLASTY Left 06/08/2016   Procedure: TOTAL HIP ARTHROPLASTY ANTERIOR APPROACH;  Surgeon: Kathryne Hitch, MD;  Location[facility];  Service: Orthopedics;  Laterality: Left;   TOTAL HIP ARTHROPLASTY Right 07/20/2016   Procedure: RIGHT TOTAL HIP ARTHROPLASTY ANTERIOR APPROACH;  Surgeon: Kathryne Hitch, MD;  Location: [facility];  Service: Orthopedics;  Laterality: Right;     reports that he quit smoking about 2 years ago. His smoking use included cigarettes. He has a 30.00 pack-year smoking history. He has never used smokeless tobacco. He reports that he does not currently use drugs after having used the following drugs: Marijuana. He reports that he does not drink alcohol.  Allergies  Allergen Reactions   Benadryl [Diphenhydramine] Hives and Swelling   Chantix [Varenicline] Nausea And Vomiting    Family History  Problem Relation Age of Onset   Hypertension Mother    Diabetes Mother     Prior to Admission medications   Medication Sig Start Date End Date Taking? Authorizing Provider  aspirin EC 81 MG tablet Take 81 mg by mouth daily.   Yes [provider]  carvedilol (COREG) 6.25 MG tablet Take 1 tablet (6.25 mg total) by mouth 2 (two) times daily. 05/17/23  Yes Jonelle Sidle, MD  isosorbide mononitrate (IMDUR) 60 MG 24 hr tablet Take 1.5 tablets (90 mg total) by mouth daily. 06/03/23 09/01/23 Yes Sharlene Dory, NP  lisinopril (ZESTRIL) 10 MG tablet Take 1 tablet (10 mg total) by mouth daily. 05/17/23  Yes Jonelle Sidle, MD  nitroGLYCERIN (NITROSTAT) 0.4 MG SL tablet Place 1 tablet (0.4 mg total) under the tongue every 5 (five) minutes x 3 doses as needed for chest pain (If no relief after 3rd dose, proceed to ED or call 911). 05/17/23  Yes Jonelle Sidle, MD  rosuvastatin (CRESTOR) 20 MG tablet Take 1 tablet (20 mg total) by mouth daily. 05/29/23  Yes Jonelle Sidle, MD    Physical  Exam: Vitals:   06/24/23 0900 06/24/23 0915 06/24/23 1000 06/24/23 1015  BP: 120/80 103/69 112/70 104/66  Pulse: (!) 59 (!) 54 (!) 58 (!) 50  Resp: 18 (!) 22 19 (!) 22  Temp:      TempSrc:      SpO2: 91% 91% 94% 98%  Weight:      Height:        Constitutional: NAD, calm, comfortable Eyes: PERRL, lids and conjunctivae normal ENMT: Mucous membranes are moist. Posterior pharynx clear of any exudate or lesions.Normal dentition.  Neck: normal, supple, no masses, no thyromegaly Respiratory: clear to auscultation bilaterally, no wheezing, no crackles. Normal respiratory effort. No accessory muscle use.  Cardiovascular: normal s1, s2 sounds, no murmurs / rubs / gallops. No extremity edema. 2+ pedal pulses. No carotid bruits.  Abdomen: no tenderness, no masses palpated. No hepatosplenomegaly. Bowel sounds positive.  Musculoskeletal: no clubbing / cyanosis. No joint deformity upper and lower extremities. Good ROM, no contractures. Normal muscle tone.  Skin: no rashes, lesions, ulcers. No induration Neurologic: CN 2-12 grossly intact. Sensation intact, DTR normal. Strength 5/5 in all 4.  Psychiatric: Normal judgment and insight. Alert and oriented x 3. Normal mood.   Labs on Admission: I have personally reviewed following labs and imaging studies  CBC: Recent Labs  Lab 06/24/23 0825 06/24/23 0858  WBC 5.7  --   NEUTROABS 2.9  --   HGB 15.3 15.6  HCT 45.7 46.0  MCV 92.1  --   PLT 192  --    Basic Metabolic Panel: Recent Labs  Lab 06/24/23 0825 06/24/23 0858  NA 135 140  K 3.8 3.9  CL 107 109  CO2 22  --   GLUCOSE 178* 168*  BUN 20 22*  CREATININE 0.99 1.00  CALCIUM 8.9  --   MG 1.9  --    GFR: Estimated Creatinine Clearance: 76.2 mL/min (by C-G formula based on SCr of 1 mg/dL). Liver Function Tests: Recent Labs  Lab 06/24/23 0825  AST 25  ALT 40  ALKPHOS 77  BILITOT 0.4  PROT 7.2  ALBUMIN 4.0   Recent Labs  Lab 06/24/23 0825  LIPASE 38   No results for  input(s): "AMMONIA" in the last 168 hours. Coagulation Profile: No results for input(s): "INR", "PROTIME" in the last 168 hours. Cardiac Enzymes: No results for input(s): "CKTOTAL", "CKMB", "CKMBINDEX", "TROPONINI" in the last 168 hours. BNP (last 3 results) No results for input(s): "PROBNP" in the last 8760 hours. HbA1C: No results for input(s): "HGBA1C" in the last 72 hours. CBG: Recent Labs  Lab 06/24/23 0833  GLUCAP 177*   Lipid Profile: No results for input(s): "CHOL", "HDL", "LDLCALC", "TRIG", "CHOLHDL", "LDLDIRECT" in the last 72 hours. Thyroid Function Tests: No results for input(s): "TSH", "T4TOTAL", "FREET4", "T3FREE", "THYROIDAB" in the last 72 hours. Anemia Panel: No results for input(s): "VITAMINB12", "FOLATE", "FERRITIN", "TIBC", "IRON", "RETICCTPCT" in the last 72 hours. Urine analysis:  Component Value Date/Time   COLORURINE YELLOW 01/31/2017 0820   APPEARANCEUR HAZY (A) 01/31/2017 0820   LABSPEC 1.017 01/31/2017 0820   PHURINE 5.0 01/31/2017 0820   GLUCOSEU NEGATIVE 01/31/2017 0820   HGBUR LARGE (A) 01/31/2017 0820   BILIRUBINUR NEGATIVE 01/31/2017 0820   KETONESUR NEGATIVE 01/31/2017 0820   PROTEINUR 100 (A) 01/31/2017 0820   UROBILINOGEN 0.2 09/29/2015 1850   NITRITE NEGATIVE 01/31/2017 0820   LEUKOCYTESUR NEGATIVE 01/31/2017 0820    Radiological Exams on Admission: DG Chest Portable 1 View  Result Date: 06/24/2023 CLINICAL DATA:  Chest pain. EXAM: PORTABLE CHEST 1 VIEW COMPARISON:  Chest x-ray May 10, 2020. FINDINGS: The heart size and mediastinal contours are within normal limits. Both lungs are clear. No visible pleural effusions or pneumothorax. No acute osseous abnormality. Median sternotomy. IMPRESSION: No active disease. Electronically Signed   By: Feliberto Harts M.D.   On: 06/24/2023 08:49    EKG: Independently reviewed. No acute ischemic changes seen.   Assessment/Plan Principal Problem:   Chest pain with high risk for cardiac  etiology Active Problems:   Mixed hyperlipidemia   Tobacco abuse   Essential hypertension, benign   Coronary artery disease involving native coronary artery of native heart with angina pectoris (HCC)   Ischemic cardiomyopathy   History of CVA (cerebrovascular accident)   Essential tremor   History of Left ventricular mural thrombus   CKD (chronic kidney disease) stage 2, GFR 60-89 ml/min   COPD (chronic obstructive pulmonary disease) (HCC)   Depression   Former smoker   Marijuana use, episodic   Hyperglycemia   Chest pain/stable angina/CAD - fortunately HS troponins tests have been reassuring at 30, 24 - admit to a cardiac monitored bed  - 2D echocardiogram ordered and completed, to be read by cardiologist - resumed home carvedilol 6.25 mg BID, imdur 60 mg, rosuvastatin 20 mg, aspirin 81 mg   Ischemic cardiomyopathy  - updated 2D echocardiogram ordered - further recommendations to follow   History of CVA - continue aspirin 81 mg and rosuvastatin 20 mg   Tobacco  - counseled on cessation  - will offer nicotine patch daily for cravings  Stage 2 CKD  - renally dose medication as able  - resumed home lisinopril for renal protection   History of marijuana use - follow up urine toxicology screen   COPD  - currently compensated, no active symptoms  - bronchodilators as needed   Prediabetes with hyperglycemia  - A1c was 6.0% several years ago - update A1c  - SSI coverage ordered as needed  - goal BS in hospital 140-180   Essential Hypertension  - optimally controlled blood pressures   DVT prophylaxis: [place] heparin   Code Status: full   Family Communication: wife at bedside   Disposition Plan: anticipate home   Consults called: cardiology   Admission status: OB  Level of care: Telemetry Riggin Cuttino MD [facility] How to contact the [facility] Attending or Consulting provider 7A - 7P or covering provider during after hours 7P -7A, for this patient?  Check the  care team in [facility] and look for a) attending/consulting [facility] provider listed and b) the [facility] team listed Log into [email or web address] and use [place]'s universal password to access. If you do not have the password, please contact the hospital operator. Locate the [facility] provider you are looking for under [facility] and page to a number that you can be directly reached. If you still have difficulty reaching the provider, please page the [facility] (  Director on Call) for the Hospitalists listed on amion for assistance.   If 7PM-7AM, please contact night-coverage [email or web address] Password [facility]  06/24/2023, 12:54 PM

## 2023-06-24 NOTE — ED Triage Notes (Signed)
Pt took 1 sublingual nitroglycerin this morning around 0700, pt states it helped some but not much.

## 2023-06-24 NOTE — Progress Notes (Signed)
*  PRELIMINARY RESULTS* Echocardiogram 2D Echocardiogram has been performed with Definity.  Stacey Drain 06/24/2023, 3:19 PM

## 2023-06-24 NOTE — ED Triage Notes (Signed)
Pt c/o chest pain in the center of his chest that started around 0700 this morning. Stated when the pain started it was hurting in his upper left chest/shoulder but not the pain is in the center of his chest and radiating some down his left arm. Pt does have a history of heart attacks and a stroke.

## 2023-06-24 NOTE — Hospital Course (Signed)
57 year old gentleman smoker with significant coronary artery disease status post CABG in 2006, history of LV thrombus previously had been fully anticoagulated with warfarin, hyperlipidemia, history of CVA, ischemic cardiomyopathy, CKD, hypertension presented to the emergency department complaining of substernal chest pain radiating to left arm with exertion and resolving at rest.  Symptoms started this morning after waking up.  He took nitroglycerin at home and was chest pain-free when he arrived.  He had a recent stress test on 2424 that showed a large extensive apical infarction and no evidence of ischemia.  The LVEF was noted to be 35%.  His medications were adjusted and his Imdur was increased to 90 mg.  Patient reports that he has had terrible headaches since taking the higher dose of Imdur.  The ED provider consulted cardiology who recommended that patient be admitted for observation and further medical management.

## 2023-06-25 ENCOUNTER — Other Ambulatory Visit (HOSPITAL_COMMUNITY): Payer: Self-pay

## 2023-06-25 DIAGNOSIS — Z8673 Personal history of transient ischemic attack (TIA), and cerebral infarction without residual deficits: Secondary | ICD-10-CM | POA: Diagnosis not present

## 2023-06-25 DIAGNOSIS — E1165 Type 2 diabetes mellitus with hyperglycemia: Secondary | ICD-10-CM | POA: Diagnosis present

## 2023-06-25 DIAGNOSIS — Z86718 Personal history of other venous thrombosis and embolism: Secondary | ICD-10-CM | POA: Diagnosis not present

## 2023-06-25 DIAGNOSIS — R202 Paresthesia of skin: Secondary | ICD-10-CM | POA: Diagnosis present

## 2023-06-25 DIAGNOSIS — E1122 Type 2 diabetes mellitus with diabetic chronic kidney disease: Secondary | ICD-10-CM | POA: Diagnosis present

## 2023-06-25 DIAGNOSIS — R079 Chest pain, unspecified: Secondary | ICD-10-CM | POA: Diagnosis present

## 2023-06-25 DIAGNOSIS — I5031 Acute diastolic (congestive) heart failure: Secondary | ICD-10-CM | POA: Diagnosis not present

## 2023-06-25 DIAGNOSIS — E7849 Other hyperlipidemia: Secondary | ICD-10-CM | POA: Diagnosis not present

## 2023-06-25 DIAGNOSIS — E782 Mixed hyperlipidemia: Secondary | ICD-10-CM | POA: Diagnosis present

## 2023-06-25 DIAGNOSIS — Z8249 Family history of ischemic heart disease and other diseases of the circulatory system: Secondary | ICD-10-CM | POA: Diagnosis not present

## 2023-06-25 DIAGNOSIS — T463X5A Adverse effect of coronary vasodilators, initial encounter: Secondary | ICD-10-CM | POA: Diagnosis present

## 2023-06-25 DIAGNOSIS — Z96643 Presence of artificial hip joint, bilateral: Secondary | ICD-10-CM | POA: Diagnosis present

## 2023-06-25 DIAGNOSIS — I5033 Acute on chronic diastolic (congestive) heart failure: Secondary | ICD-10-CM | POA: Diagnosis present

## 2023-06-25 DIAGNOSIS — Z87891 Personal history of nicotine dependence: Secondary | ICD-10-CM | POA: Diagnosis not present

## 2023-06-25 DIAGNOSIS — N182 Chronic kidney disease, stage 2 (mild): Secondary | ICD-10-CM | POA: Diagnosis present

## 2023-06-25 DIAGNOSIS — I2510 Atherosclerotic heart disease of native coronary artery without angina pectoris: Secondary | ICD-10-CM | POA: Diagnosis not present

## 2023-06-25 DIAGNOSIS — Z833 Family history of diabetes mellitus: Secondary | ICD-10-CM | POA: Diagnosis not present

## 2023-06-25 DIAGNOSIS — F32A Depression, unspecified: Secondary | ICD-10-CM | POA: Diagnosis present

## 2023-06-25 DIAGNOSIS — E119 Type 2 diabetes mellitus without complications: Secondary | ICD-10-CM

## 2023-06-25 DIAGNOSIS — I255 Ischemic cardiomyopathy: Secondary | ICD-10-CM | POA: Diagnosis present

## 2023-06-25 DIAGNOSIS — Z888 Allergy status to other drugs, medicaments and biological substances status: Secondary | ICD-10-CM | POA: Diagnosis not present

## 2023-06-25 DIAGNOSIS — I130 Hypertensive heart and chronic kidney disease with heart failure and stage 1 through stage 4 chronic kidney disease, or unspecified chronic kidney disease: Secondary | ICD-10-CM | POA: Diagnosis present

## 2023-06-25 DIAGNOSIS — Z7982 Long term (current) use of aspirin: Secondary | ICD-10-CM | POA: Diagnosis not present

## 2023-06-25 DIAGNOSIS — Z79899 Other long term (current) drug therapy: Secondary | ICD-10-CM | POA: Diagnosis not present

## 2023-06-25 DIAGNOSIS — I25118 Atherosclerotic heart disease of native coronary artery with other forms of angina pectoris: Secondary | ICD-10-CM | POA: Diagnosis present

## 2023-06-25 DIAGNOSIS — Z7901 Long term (current) use of anticoagulants: Secondary | ICD-10-CM | POA: Diagnosis not present

## 2023-06-25 DIAGNOSIS — Z951 Presence of aortocoronary bypass graft: Secondary | ICD-10-CM | POA: Diagnosis not present

## 2023-06-25 DIAGNOSIS — J449 Chronic obstructive pulmonary disease, unspecified: Secondary | ICD-10-CM | POA: Diagnosis present

## 2023-06-25 DIAGNOSIS — I10 Essential (primary) hypertension: Secondary | ICD-10-CM | POA: Diagnosis not present

## 2023-06-25 DIAGNOSIS — I509 Heart failure, unspecified: Secondary | ICD-10-CM

## 2023-06-25 DIAGNOSIS — G4440 Drug-induced headache, not elsewhere classified, not intractable: Secondary | ICD-10-CM | POA: Diagnosis present

## 2023-06-25 LAB — LIPID PANEL
Cholesterol: 161 mg/dL (ref 0–200)
HDL: 38 mg/dL — ABNORMAL LOW (ref >40)
LDL Cholesterol: 93 mg/dL (ref 0–99)
Total CHOL/HDL Ratio: 4.2 RATIO
Triglycerides: 149 mg/dL (ref <150)
VLDL: 30 mg/dL (ref 0–40)

## 2023-06-25 LAB — RAPID URINE DRUG SCREEN, HOSP PERFORMED
Amphetamines: NOT DETECTED
Barbiturates: NOT DETECTED
Benzodiazepines: NOT DETECTED
Cocaine: NOT DETECTED
Opiates: NOT DETECTED
Tetrahydrocannabinol: NOT DETECTED

## 2023-06-25 LAB — BASIC METABOLIC PANEL
Anion gap: 9 (ref 5–15)
Anion gap: 10 (ref 5–15)
BUN: 18 mg/dL (ref 6–20)
BUN: 18 mg/dL (ref 6–20)
CO2: 21 mmol/L — ABNORMAL LOW (ref 22–32)
CO2: 29 mmol/L (ref 22–32)
Calcium: 8.7 mg/dL — ABNORMAL LOW (ref 8.9–10.3)
Calcium: 9.9 mg/dL (ref 8.9–10.3)
Chloride: 106 mmol/L (ref 98–111)
Chloride: 98 mmol/L (ref 98–111)
Creatinine, Ser: 0.97 mg/dL (ref 0.61–1.24)
Creatinine, Ser: 1.32 mg/dL — ABNORMAL HIGH (ref 0.61–1.24)
GFR, Estimated: >60 mL/min (ref >60)
GFR, Estimated: >60 mL/min (ref >60)
Glucose, Bld: 109 mg/dL — ABNORMAL HIGH (ref 70–99)
Glucose, Bld: 106 mg/dL — ABNORMAL HIGH (ref 70–99)
Potassium: 3.9 mmol/L (ref 3.5–5.1)
Potassium: 4.8 mmol/L (ref 3.5–5.1)
Sodium: 136 mmol/L (ref 135–145)
Sodium: 137 mmol/L (ref 135–145)

## 2023-06-25 LAB — CBC
HCT: 46.6 % (ref 39.0–52.0)
Hemoglobin: 15.4 g/dL (ref 13.0–17.0)
MCH: 30.7 pg (ref 26.0–34.0)
MCHC: 33 g/dL (ref 30.0–36.0)
MCV: 93 fL (ref 80.0–100.0)
Platelets: 169 10*3/uL (ref 150–400)
RBC: 5.01 MIL/uL (ref 4.22–5.81)
RDW: 13.2 % (ref 11.5–15.5)
WBC: 5.7 10*3/uL (ref 4.0–10.5)
nRBC: 0 % (ref 0.0–0.2)

## 2023-06-25 LAB — GLUCOSE, CAPILLARY
Glucose-Capillary: 145 mg/dL — ABNORMAL HIGH (ref 70–99)
Glucose-Capillary: 187 mg/dL — ABNORMAL HIGH (ref 70–99)
Glucose-Capillary: 90 mg/dL (ref 70–99)
Glucose-Capillary: 117 mg/dL — ABNORMAL HIGH (ref 70–99)

## 2023-06-25 LAB — MAGNESIUM: Magnesium: 1.9 mg/dL (ref 1.7–2.4)

## 2023-06-25 MED ORDER — DAPAGLIFLOZIN PROPANEDIOL 10 MG PO TABS
10.0000 mg | ORAL_TABLET | Freq: Every day | ORAL | Status: DC
  Administered 2023-06-25 – 2023-06-26 (×2): 10 mg via ORAL
  Filled 2023-06-25 (×2): qty 1

## 2023-06-25 MED ORDER — LIVING WELL WITH DIABETES BOOK: Freq: Once | Status: AC

## 2023-06-25 MED ORDER — FUROSEMIDE 10 MG/ML IJ SOLN
40.0000 mg | Freq: Every day | INTRAMUSCULAR | Status: DC
  Administered 2023-06-25: 40 mg via INTRAVENOUS
  Filled 2023-06-25: qty 4

## 2023-06-25 NOTE — Progress Notes (Signed)
06/25/2023 10:06 AM  Patient and wife informed that he has new diagnosis of type 2 diabetes mellitus as evidenced by an A1c of 7.4%.  Pt declined to have referral for outpatient diabetes education.   Pt informed we will manage with oral medication and have close outpatient follow up. He will need to establish a primary care provider.  Pt and wife verbalized understanding.   Maryln Manuel, MD

## 2023-06-25 NOTE — Discharge Instructions (Signed)
  Providers Accepting New Patients in [place], [place]    [facility] [address]  [place], [place] [address] [phone] Accepts most insurances  [facility] [address] [place], [place] [address] [phone] Accepts most insurances  [facility] [place] [address] [place], [place] [address]  [phone] Must meet requirements  [facility] [address]  [place], [place] [address] [phone] Accepts most insurances  [facility] [address]  [place], [place] [address] [phone] Accepts most insurances  [facility] [address]   [place], [place]   [phone] Accepts most insurances  [facility] ([facility])  [address]  [place], [place] [address] [phone] Accepts most insurances     [facility] [address]  [place], [place] [address] [phone] Accepts most insurances  [facility] [facility] [address] [place], [place] [address] [phone] option 1 Accepts Medicaid and Uninsured  [facility] [address]  [place], [place] [address] [phone] Accepts most insurances  Tesfaye Fanta, MD [address]  [place], [place] [address] [phone] Accepts most insurances  [facility] [place] [address]  [place], [place] [address] [phone] Accepts most insurances  [facility] [address] [place], [place] [address] [phone] Accepts most insurances  Zack Hall, MD [address] [place], [place] [address] [phone]  Accepts most insurances                      

## 2023-06-25 NOTE — TOC CM/SW Note (Signed)
Transition of Care [facility]) - Inpatient Brief Assessment   Patient Details  Name: Steve Vang MRN: [id number] Date of Birth: [birth year]  Transition of [facility]) CM/SW Contact:    Elliot Gault, LCSW Phone Number: 06/25/2023, 8:36 AM   Clinical Narrative:  Pt admitted from home. Review of pt's record shows pt has a cardiologist but not a PCP. TOC added list of PCP's accepting new patients to pt's AVS for pt's reference.  No other TOC needs identified at this time. Will follow and assist if any other needs arise.  Transition of Care Asessment: Insurance and Status: Insurance coverage has been reviewed Patient has primary care physician: No Home environment has been reviewed: from home Prior level of function:: independent Prior/Current Home Services: No current home services Social Determinants of Health Reivew: SDOH reviewed no interventions necessary Readmission risk has been reviewed: Yes Transition of care needs: transition of care needs identified, TOC will continue to follow

## 2023-06-25 NOTE — Progress Notes (Signed)
PROGRESS NOTE   BARLOW HARRISON  YNW:[id number] DOB: March 19, 1966 DOA: 06/24/2023 PCP: Patient, No Pcp Per   Chief Complaint  Patient presents with   Chest Pain   Level of care: Telemetry  Brief Admission History:  57 year old gentleman smoker with significant coronary artery disease status post CABG in 2006, history of LV thrombus previously had been fully anticoagulated with warfarin, hyperlipidemia, history of CVA, ischemic cardiomyopathy, CKD, hypertension presented to the emergency department complaining of substernal chest pain radiating to left arm with exertion and resolving at rest.  Symptoms started this morning after waking up.  He took nitroglycerin at home and was chest pain-free when he arrived.  He had a recent stress test on 2424 that showed a large extensive apical infarction and no evidence of ischemia.  The LVEF was noted to be 35%.  His medications were adjusted and his Imdur was increased to 90 mg.  Patient reports that he has had terrible headaches since taking the higher dose of Imdur.  The ED provider consulted cardiology who recommended that patient be admitted for observation and further medical management.   Assessment and Plan:  Chest pain/stable angina/CAD - fortunately HS troponins tests have been reassuring at 30, 24 - admit to a cardiac monitored bed  - 2D echocardiogram ordered and completed, to be read by cardiologist - resumed home carvedilol 6.25 mg BID, imdur 60 mg, rosuvastatin 20 mg, aspirin 81 mg    Ischemic cardiomyopathy  - updated 2D echocardiogram ordered:  LVEF 50% with RWMAs, grade 2 DD  - he is being medically managed by cardiology service   Acute HFpEF  - IV lasix started for diuresis - monitor weight, I/Os, electrolytes  - he has been started on empagliflozin 10 mg daily - requested pharm D to do benefits check on empagliflozin   History of CVA - continue aspirin 81 mg and rosuvastatin 20 mg for secondary prevention   Tobacco  -  counseled on cessation  - will offer nicotine patch daily for cravings   Stage 2 CKD  - renally dose medication as able  - resumed home lisinopril for renal protection    History of marijuana use - follow up urine toxicology screen (not done as of 6/25)   COPD  - currently compensated, no active symptoms  - bronchodilators as needed    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 - A1c was 6.0% several years ago - updated A1c 7.4%   - SSI coverage ordered as needed  - goal BS in hospital 140-180  - he has been started on empagliflozin 10 mg daily  - pt declined referral for outpatient diabetes education  - diabetes coordinator consulted in hospital  - pt strongly advised that he will need a PCP for ongoing management  - TOC consulted to assist with getting a PCP - pt will need prescription for blood glucose meter and testing supplies at discharge    Essential Hypertension  - BPs have been labile - IV furosemide started 6/25     DVT prophylaxis: [place] heparin  Code Status: full  Family Communication: wife at bedside Disposition: anticipate home when medically stable    Consultants:   Procedures:   Antimicrobials:    Subjective: Pt reports a little SOB today but overall no chest pain.  Headache is better on lower dose of imdur.   Objective: Vitals:   06/24/23 2015 06/24/23 2135 06/25/23 0038 06/25/23 0411  BP: 104/77 130/75 136/82 (!) 166/99  Pulse: Marland Kitchen)  58 (!) 56 (!) 56 61  Resp:   20 20  Temp:   (!) 97.4 F (36.3 C) 97.7 F (36.5 C)  TempSrc:   Oral Oral  SpO2: 97% 95% 97% 98%  Weight:    77.2 kg  Height:        Intake/Output Summary (Last 24 hours) at 06/25/2023 1044 Last data filed at 06/25/2023 0900 Gross per 24 hour  Intake 1480 ml  Output --  Net 1480 ml   Filed Weights   06/24/23 0815 06/24/23 1442 06/25/23 0411  Weight: 79 kg 76.1 kg 77.2 kg   Examination:  General exam: Appears calm and comfortable  Respiratory  system: Clear to auscultation. Respiratory effort normal. Cardiovascular system: normal S1 & S2 heard. No JVD, murmurs, rubs, gallops or clicks. No pedal edema. Gastrointestinal system: Abdomen is nondistended, soft and nontender. No organomegaly or masses felt. Normal bowel sounds heard. Central nervous system: Alert and oriented. No focal neurological deficits. Extremities: Symmetric 5 x 5 power. Skin: No rashes, lesions or ulcers. Psychiatry: Judgement and insight appear normal. Mood & affect appropriate.   Data Reviewed: I have personally reviewed following labs and imaging studies  CBC: Recent Labs  Lab 06/24/23 0825 06/24/23 0858 06/25/23 0418  WBC 5.7  --  5.7  NEUTROABS 2.9  --   --   HGB 15.3 15.6 15.4  HCT 45.7 46.0 46.6  MCV 92.1  --  93.0  PLT 192  --  169    Basic Metabolic Panel: Recent Labs  Lab 06/24/23 0825 06/24/23 0858 06/25/23 0418  NA 135 140 136  K 3.8 3.9 3.9  CL 107 109 106  CO2 22  --  21*  GLUCOSE 178* 168* 109*  BUN 20 22* 18  CREATININE 0.99 1.00 0.97  CALCIUM 8.9  --  8.7*  MG 1.9  --  1.9    CBG: Recent Labs  Lab 06/24/23 0833 06/24/23 1632 06/24/23 2116 06/25/23 0722  GLUCAP 177* 185* 140* 145*    No results found for this or any previous visit (from the past 240 hour(s)).   Radiology Studies: ECHOCARDIOGRAM COMPLETE  Result Date: 06/24/2023    ECHOCARDIOGRAM REPORT   Patient Name:   DONZELL COLLER Date of Exam: 06/24/2023 Medical Rec #:  [id number]    Height:       67.0 in Accession #:    [id number]   Weight:       174.2 lb Date of Birth:  Sep 05, 1966   BSA:          1.907 m Patient Age:    57 years     BP: Patient Gender: M            HR: [facility] Procedure: 2D Echo and Color Doppler Indications:    Chest pain  Sonographer:    BW Referring Phys: [id number] Gahel Safley L Elnita Surprenant IMPRESSIONS  1. Left ventricular ejection fraction, by estimation, is 50%. The left ventricle has low normal function. The left ventricle  demonstrates regional wall motion abnormalities (see scoring diagram/findings for description). Left ventricular diastolic parameters are consistent with Grade II diastolic dysfunction (pseudonormalization).  2. Right ventricular systolic function is mildly reduced. The right ventricular size is normal.  3. The mitral valve is grossly normal. No evidence of mitral valve regurgitation. No evidence of mitral stenosis.  4. The aortic valve has an indeterminant number of cusps. Aortic valve regurgitation is not visualized. No aortic stenosis is present. Conclusion(s)/Recommendation(s): No left ventricular  mural or apical thrombus/thrombi. FINDINGS  Left Ventricle: Left ventricular ejection fraction, by estimation, is 50%. The left ventricle has low normal function. The left ventricle demonstrates regional wall motion abnormalities. The left ventricular internal cavity size was normal in size. There is no left ventricular hypertrophy. Left ventricular diastolic parameters are consistent with Grade II diastolic dysfunction (pseudonormalization).  LV Wall Scoring: The anterior septum is hypokinetic. Right Ventricle: The right ventricular size is normal. No increase in right ventricular wall thickness. Right ventricular systolic function is mildly reduced. Left Atrium: Left atrial size was normal in size. Right Atrium: Right atrial size was normal in size. Pericardium: There is no evidence of pericardial effusion. Mitral Valve: The mitral valve is grossly normal. No evidence of mitral valve regurgitation. No evidence of mitral valve stenosis. Tricuspid Valve: The tricuspid valve is not well visualized. Tricuspid valve regurgitation is not demonstrated. No evidence of tricuspid stenosis. Aortic Valve: The aortic valve has an indeterminant number of cusps. Aortic valve regurgitation is not visualized. No aortic stenosis is present. Pulmonic Valve: The pulmonic valve was not well visualized. Pulmonic valve regurgitation is not  visualized. No evidence of pulmonic stenosis. Aorta: The aortic root is normal in size and structure. Venous: The inferior vena cava was not well visualized. IAS/Shunts: The interatrial septum was not well visualized.  LEFT VENTRICLE PLAX 2D LVIDd:         5.50 cm   Diastology LVIDs:         4.00 cm   LV e' medial:    3.59 cm/s LV PW:         1.00 cm   LV E/e' medial:  21.6 LV IVS:        1.00 cm   LV e' lateral:   7.72 cm/s LVOT diam:     1.90 cm   LV E/e' lateral: 10.0 LV SV:         53 LV SV Index:   28 LVOT Area:     2.84 cm  RIGHT VENTRICLE RV S prime:     7.40 cm/s TAPSE (M-mode): 1.4 cm LEFT ATRIUM             Index        RIGHT ATRIUM           Index LA diam:        4.50 cm 2.36 cm/m   RA Area:     15.50 cm LA Vol (A2C):   76.4 ml 40.06 ml/m  RA Volume:   38.50 ml  20.19 ml/m LA Vol (A4C):   43.4 ml 22.76 ml/m LA Biplane Vol: 63.4 ml 33.25 ml/m  AORTIC VALVE LVOT Vmax:   80.20 cm/s LVOT Vmean:  57.100 cm/s LVOT VTI:    0.186 m  AORTA Ao Root diam: 3.40 cm MITRAL VALVE MV Area (PHT): 4.21 cm    SHUNTS MV Decel Time: 180 msec    Systemic VTI:  0.19 m MV E velocity: 77.40 cm/s  Systemic Diam: 1.90 cm MV A velocity: 56.30 cm/s MV E/A ratio:  1.37 Vishnu Priya Mallipeddi Electronically signed by Winfield Rast Mallipeddi Signature Date/Time: 06/24/2023/7:21:00 PM    Final    DG Chest Portable 1 View  Result Date: 06/24/2023 CLINICAL DATA:  Chest pain. EXAM: PORTABLE CHEST 1 VIEW COMPARISON:  Chest x-ray May 10, 2020. FINDINGS: The heart size and mediastinal contours are within normal limits. Both lungs are clear. No visible pleural effusions or pneumothorax. No acute osseous abnormality. Median sternotomy. IMPRESSION: No  active disease. Electronically Signed   By: Feliberto Harts M.D.   On: 06/24/2023 08:49    Scheduled Meds:  aspirin EC  81 mg Oral Daily   carvedilol  6.25 mg Oral BID   dapagliflozin propanediol  10 mg Oral Daily   furosemide  40 mg Intravenous Daily   heparin  5,000 Units  Subcutaneous Q8H   insulin aspart  0-9 Units Subcutaneous TID WC   isosorbide mononitrate  60 mg Oral Daily   lisinopril  10 mg Oral Daily   living well with diabetes book   Does not apply Once   ranolazine  500 mg Oral BID   rosuvastatin  20 mg Oral QPM   Continuous Infusions:   LOS: 0 days   Time spent: 44 mins  Aashrith Eves Laural Benes, MD How to contact the [facility] Attending or Consulting provider 7A - 7P or covering provider during after hours 7P -7A, for this patient?  Check the care team in [facility] and look for a) attending/consulting [facility] provider listed and b) the [facility] team listed Log into [email or web address] and use [place]'s universal password to access. If you do not have the password, please contact the hospital operator. Locate the [facility] provider you are looking for under [facility] and page to a number that you can be directly reached. If you still have difficulty reaching the provider, please page the [facility] (Director on Call) for the Hospitalists listed on amion for assistance.  06/25/2023, 10:44 AM

## 2023-06-25 NOTE — Progress Notes (Signed)
Progress Note  Patient Name: ANIK WESCH Date of Encounter: 06/25/2023  Primary Cardiologist: [name], MD  Subjective   No acute events overnight, no more chest pains.  He continues to have DOE.  Echo on admit showed LVEF 50% and G2 DD.  Inpatient Medications    Scheduled Meds:  aspirin EC  81 mg Oral Daily   carvedilol  6.25 mg Oral BID   dapagliflozin propanediol  10 mg Oral Daily   furosemide  40 mg Intravenous Daily   heparin  5,000 Units Subcutaneous Q8H   insulin aspart  0-9 Units Subcutaneous TID WC   isosorbide mononitrate  60 mg Oral Daily   lisinopril  10 mg Oral Daily   living well with diabetes book   Does not apply Once   ranolazine  500 mg Oral BID   rosuvastatin  20 mg Oral QPM   Continuous Infusions:  PRN Meds: acetaminophen **OR** acetaminophen, morphine injection, nicotine, nitroGLYCERIN, ondansetron **OR** ondansetron (ZOFRAN) IV, oxyCODONE, senna-docusate, traZODone   Vital Signs    Vitals:   06/24/23 2015 06/24/23 2135 06/25/23 0038 06/25/23 0411  BP: 104/77 130/75 136/82 (!) 166/99  Pulse: (!) 58 (!) 56 (!) 56 61  Resp:   20 20  Temp:   (!) 97.4 F (36.3 C) 97.7 F (36.5 C)  TempSrc:   Oral Oral  SpO2: 97% 95% 97% 98%  Weight:    77.2 kg  Height:        Intake/Output Summary (Last 24 hours) at 06/25/2023 1035 Last data filed at 06/25/2023 0900 Gross per 24 hour  Intake 1480 ml  Output --  Net 1480 ml   Filed Weights   06/24/23 0815 06/24/23 1442 06/25/23 0411  Weight: 79 kg 76.1 kg 77.2 kg    Telemetry     Personally reviewed, NSR, no alarms.   Physical Exam   GEN: No acute distress.   Neck: JVD not examined Cardiac: RRR, no murmur, rub, or gallop.  Respiratory: Nonlabored. Clear to auscultation bilaterally. GI: Soft, nontender, bowel sounds present. MS: No edema; No deformity. Neuro:  Nonfocal. Psych: Alert and oriented x 3. Normal affect.  Labs    Chemistry Recent Labs  Lab 06/24/23 0825 06/24/23 0858  06/25/23 0418  NA 135 140 136  K 3.8 3.9 3.9  CL 107 109 106  CO2 22  --  21*  GLUCOSE 178* 168* 109*  BUN 20 22* 18  CREATININE 0.99 1.00 0.97  CALCIUM 8.9  --  8.7*  PROT 7.2  --   --   ALBUMIN 4.0  --   --   AST 25  --   --   ALT 40  --   --   ALKPHOS 77  --   --   BILITOT 0.4  --   --   GFRNONAA >60  --  >60  ANIONGAP 6  --  9     Hematology Recent Labs  Lab 06/24/23 0825 06/24/23 0858 06/25/23 0418  WBC 5.7  --  5.7  RBC 4.96  --  5.01  HGB 15.3 15.6 15.4  HCT 45.7 46.0 46.6  MCV 92.1  --  93.0  MCH 30.8  --  30.7  MCHC 33.5  --  33.0  RDW 13.2  --  13.2  PLT 192  --  169    Cardiac Enzymes Recent Labs  Lab 06/24/23 0825 06/24/23 1010  TROPONINIHS 30* 24*    BNP Recent Labs  Lab 06/24/23 0825  BNP 72.0     DDimerNo results for input(s): "DDIMER" in the last 168 hours.   Radiology    ECHOCARDIOGRAM COMPLETE  Result Date: 06/24/2023    ECHOCARDIOGRAM REPORT   Patient Name:   TYMON NEMETZ Date of Exam: 06/24/2023 Medical Rec #:  [id number]    Height:       67.0 in Accession #:    [id number]   Weight:       174.2 lb Date of Birth:  [birth year]   BSA:          1.907 m Patient Age:    57 years     BP: Patient Gender: M            HR: [facility] Procedure: 2D Echo and Color Doppler Indications:    Chest pain  Sonographer:    BW Referring Phys: [id number] CLANFORD L JOHNSON IMPRESSIONS  1. Left ventricular ejection fraction, by estimation, is 50%. The left ventricle has low normal function. The left ventricle demonstrates regional wall motion abnormalities (see scoring diagram/findings for description). Left ventricular diastolic parameters are consistent with Grade II diastolic dysfunction (pseudonormalization).  2. Right ventricular systolic function is mildly reduced. The right ventricular size is normal.  3. The mitral valve is grossly normal. No evidence of mitral valve regurgitation. No evidence of mitral stenosis.  4. The aortic valve has an  indeterminant number of cusps. Aortic valve regurgitation is not visualized. No aortic stenosis is present. Conclusion(s)/Recommendation(s): No left ventricular mural or apical thrombus/thrombi. FINDINGS  Left Ventricle: Left ventricular ejection fraction, by estimation, is 50%. The left ventricle has low normal function. The left ventricle demonstrates regional wall motion abnormalities. The left ventricular internal cavity size was normal in size. There is no left ventricular hypertrophy. Left ventricular diastolic parameters are consistent with Grade II diastolic dysfunction (pseudonormalization).  LV Wall Scoring: The anterior septum is hypokinetic. Right Ventricle: The right ventricular size is normal. No increase in right ventricular wall thickness. Right ventricular systolic function is mildly reduced. Left Atrium: Left atrial size was normal in size. Right Atrium: Right atrial size was normal in size. Pericardium: There is no evidence of pericardial effusion. Mitral Valve: The mitral valve is grossly normal. No evidence of mitral valve regurgitation. No evidence of mitral valve stenosis. Tricuspid Valve: The tricuspid valve is not well visualized. Tricuspid valve regurgitation is not demonstrated. No evidence of tricuspid stenosis. Aortic Valve: The aortic valve has an indeterminant number of cusps. Aortic valve regurgitation is not visualized. No aortic stenosis is present. Pulmonic Valve: The pulmonic valve was not well visualized. Pulmonic valve regurgitation is not visualized. No evidence of pulmonic stenosis. Aorta: The aortic root is normal in size and structure. Venous: The inferior vena cava was not well visualized. IAS/Shunts: The interatrial septum was not well visualized.  LEFT VENTRICLE PLAX 2D LVIDd:         5.50 cm   Diastology LVIDs:         4.00 cm   LV e' medial:    3.59 cm/s LV PW:         1.00 cm   LV E/e' medial:  21.6 LV IVS:        1.00 cm   LV e' lateral:   7.72 cm/s LVOT diam:      1.90 cm   LV E/e' lateral: 10.0 LV SV:         53 LV SV Index:   28 LVOT Area:  2.84 cm  RIGHT VENTRICLE RV S prime:     7.40 cm/s TAPSE (M-mode): 1.4 cm LEFT ATRIUM             Index        RIGHT ATRIUM           Index LA diam:        4.50 cm 2.36 cm/m   RA Area:     15.50 cm LA Vol (A2C):   76.4 ml 40.06 ml/m  RA Volume:   38.50 ml  20.19 ml/m LA Vol (A4C):   43.4 ml 22.76 ml/m LA Biplane Vol: 63.4 ml 33.25 ml/m  AORTIC VALVE LVOT Vmax:   80.20 cm/s LVOT Vmean:  57.100 cm/s LVOT VTI:    0.186 m  AORTA Ao Root diam: 3.40 cm MITRAL VALVE MV Area (PHT): 4.21 cm    SHUNTS MV Decel Time: 180 msec    Systemic VTI:  0.19 m MV E velocity: 77.40 cm/s  Systemic Diam: 1.90 cm MV A velocity: 56.30 cm/s MV E/A ratio:  1.37 Amnah Breuer Priya Dhillon [place] Electronically signed by Winfield Rast Euline Kimbler Signature Date/Time: 06/24/2023/7:21:00 PM    Final    DG Chest Portable 1 View  Result Date: 06/24/2023 CLINICAL DATA:  Chest pain. EXAM: PORTABLE CHEST 1 VIEW COMPARISON:  Chest x-ray May 10, 2020. FINDINGS: The heart size and mediastinal contours are within normal limits. Both lungs are clear. No visible pleural effusions or pneumothorax. No acute osseous abnormality. Median sternotomy. IMPRESSION: No active disease. Electronically Signed   By: Feliberto Harts M.D.   On: 06/24/2023 08:49    Assessment & Plan    Patient  is a 58 y/o M known to have CAD s/p CABG in 2006 with LVEF 50%, history of LV thrombus, history of CVA, CKD, HTN, HLD presented to the ER with chest pain.   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 However, a couple of months ago, he started to have more frequent chest pains, 2 times per month only with strenuous exercise. Otherwise, he is physically active at baseline, can perform his household chores, swim and climb stairs with no symptoms of angina. EKG showed NSR, no new ischemia.  High-sensitivity troponins were mildly elevated, 24 and 30 (similar to values from 3 years ago). NM stress test from 5/24 showed a large extensive apical infarction and no evidence of ischemia. NM LVEF was 35%. Echocardiogram on admission showed LVEF 50%.  Will continue current antianginal therapy, Imdur 60 mg once daily (no headaches with a 60 mg dose but he had severe headaches with the 90 mg dose), continue ranolazine 500 mg twice daily (started this hospitalization), continue carvedilol 6.25 mg twice daily. -Continue cardioprotective medications, aspirin 81 mg once daily and rosuvastatin 20 mg nightly.  # Acute on chronic diastolic heart failure exacerbation -Start IV Lasix 40 mg once daily -Start p.o. Farxiga 10 mg once daily -Treat underlying risk factors, DM 2 which is newly diagnosed in this hospitalization.  # Newly diagnosed DM 2 this hospitalization, HbA1c 7.5 -Started on Farxiga, outpatient management of DM 2.  He will need PCP referral upon discharge.  # HTN, continue above medications # HLD, continue rosuvastatin 20 mg nightly, goal LDL less than 70.  I have spent a total of 30 minutes with patient reviewing chart , telemetry, EKGs, labs and examining patient as well as establishing  an assessment and plan that was discussed with the patient.  > 50% of time was spent in direct patient care.     Signed, Marjo Bicker, MD  06/25/2023, 10:35 AM

## 2023-06-25 NOTE — Inpatient Diabetes Management (Signed)
Inpatient Diabetes Program Recommendations  AACE/ADA: New Consensus Statement on Inpatient Glycemic Control (2015)  Target Ranges:  Prepandial:   less than 140 mg/dL      Peak postprandial:   less than 180 mg/dL (1-2 hours)      Critically ill patients:  140 - 180 mg/dL   Lab Results  Component Value Date   GLUCAP 145 (H) 06/25/2023   HGBA1C 7.4 (H) 06/24/2023    Latest Reference Range & Units 06/24/23 08:33 06/24/23 16:32 06/24/23 21:16 06/25/23 07:22  Glucose-Capillary 70 - 99 mg/dL 161 (H) 096 (H) 045 (H) 145 (H)  (H): Data is abnormally high  Diabetes history: New Onset DM2 Current orders for Inpatient glycemic control: Novolog 0-9 units tid  Inpatient Diabetes Program Recommendations:   Spoke with patient via phone  (DM coordinator @ [facility]) and reviewed new onset diabetes care DM2.  Ordered booklet living well with Diabetes book and dietician consult. Patient shared that he normally drinks 1-2 bottles of regular Sprite per day along with sweet tea when he goes out to eat. Discussed limiting sugars and carbohydrates and patient agrees to limit in his diet. Spoke with pt about new diagnosis. Discussed A1C results with them and explained what an A1C is, basic pathophysiology of DM Type 2, basic home care, basic diabetes diet nutrition principles, importance of checking CBGs and maintaining good CBG control to prevent long-term and short-term complications. Reviewed signs and symptoms of hyperglycemia and hypoglycemia and how to treat hypoglycemia at home. Also reviewed blood sugar goals at home.  RNs to provide ongoing basic DM education at bedside with this patient.   Discussed outpatient diabetes education with pt. And he states willingness to attend.  Thank you, Billy Fischer. Charles Niese, RN, MSN, CDE  Diabetes Coordinator Inpatient Glycemic Control Team Team Pager [phone] (8am-5pm) 06/25/2023 1:53 PM

## 2023-06-26 ENCOUNTER — Other Ambulatory Visit (HOSPITAL_COMMUNITY): Payer: Self-pay

## 2023-06-26 DIAGNOSIS — E7849 Other hyperlipidemia: Secondary | ICD-10-CM | POA: Diagnosis not present

## 2023-06-26 DIAGNOSIS — I5033 Acute on chronic diastolic (congestive) heart failure: Secondary | ICD-10-CM | POA: Diagnosis not present

## 2023-06-26 DIAGNOSIS — I10 Essential (primary) hypertension: Secondary | ICD-10-CM | POA: Diagnosis not present

## 2023-06-26 DIAGNOSIS — I2510 Atherosclerotic heart disease of native coronary artery without angina pectoris: Secondary | ICD-10-CM | POA: Diagnosis not present

## 2023-06-26 DIAGNOSIS — R079 Chest pain, unspecified: Secondary | ICD-10-CM | POA: Diagnosis not present

## 2023-06-26 LAB — MAGNESIUM: Magnesium: 2 mg/dL (ref 1.7–2.4)

## 2023-06-26 LAB — CBC
HCT: 48.5 % (ref 39.0–52.0)
Hemoglobin: 16.6 g/dL (ref 13.0–17.0)
MCH: 31.1 pg (ref 26.0–34.0)
MCHC: 34.2 g/dL (ref 30.0–36.0)
MCV: 91 fL (ref 80.0–100.0)
Platelets: 190 10*3/uL (ref 150–400)
RBC: 5.33 MIL/uL (ref 4.22–5.81)
RDW: 13.2 % (ref 11.5–15.5)
WBC: 6.4 10*3/uL (ref 4.0–10.5)
nRBC: 0 % (ref 0.0–0.2)

## 2023-06-26 LAB — BASIC METABOLIC PANEL
Anion gap: 12 (ref 5–15)
BUN: 21 mg/dL — ABNORMAL HIGH (ref 6–20)
CO2: 22 mmol/L (ref 22–32)
Calcium: 9.6 mg/dL (ref 8.9–10.3)
Chloride: 101 mmol/L (ref 98–111)
Creatinine, Ser: 1.09 mg/dL (ref 0.61–1.24)
GFR, Estimated: >60 mL/min (ref >60)
Glucose, Bld: 123 mg/dL — ABNORMAL HIGH (ref 70–99)
Potassium: 3.7 mmol/L (ref 3.5–5.1)
Sodium: 135 mmol/L (ref 135–145)

## 2023-06-26 LAB — GLUCOSE, CAPILLARY: Glucose-Capillary: 127 mg/dL — ABNORMAL HIGH (ref 70–99)

## 2023-06-26 MED ORDER — DAPAGLIFLOZIN PROPANEDIOL 10 MG PO TABS: 10.0000 mg | ORAL_TABLET | Freq: Every day | ORAL | 0 refills | Status: DC

## 2023-06-26 MED ORDER — LANCET DEVICE MISC: 1.0000 | Freq: Three times a day (TID) | 0 refills | Status: AC

## 2023-06-26 MED ORDER — RANOLAZINE ER 500 MG PO TB12: 500.0000 mg | ORAL_TABLET | Freq: Two times a day (BID) | ORAL | 1 refills | Status: DC

## 2023-06-26 MED ORDER — ISOSORBIDE MONONITRATE ER 60 MG PO TB24: 60.0000 mg | ORAL_TABLET | Freq: Every day | ORAL | 0 refills | Status: DC

## 2023-06-26 MED ORDER — LANCETS MISC. MISC: 1.0000 | Freq: Three times a day (TID) | 0 refills | Status: AC

## 2023-06-26 MED ORDER — BLOOD GLUCOSE TEST VI STRP: 1.0000 | ORAL_STRIP | Freq: Three times a day (TID) | 0 refills | Status: DC

## 2023-06-26 MED ORDER — BLOOD GLUCOSE MONITORING SUPPL DEVI: 1.0000 | Freq: Three times a day (TID) | 0 refills | Status: DC

## 2023-06-26 MED ORDER — FUROSEMIDE 20 MG PO TABS: 20.0000 mg | ORAL_TABLET | Freq: Every day | ORAL | 11 refills | Status: DC

## 2023-06-26 NOTE — Progress Notes (Signed)
Ng Discharge Note  Admit Date:  06/24/2023 Discharge date: 06/26/2023   Steve Vang to be D/C'd Home per MD order.  AVS completed. Patient/caregiver able to verbalize understanding.  Discharge Medication: Allergies as of 06/26/2023       Reactions   Benadryl [diphenhydramine] Hives, Swelling   Chantix [varenicline] Nausea And Vomiting        Medication List     TAKE these medications    aspirin EC 81 MG tablet Take 81 mg by mouth daily.   Blood Glucose Monitoring Suppl Devi 1 each by Does not apply route in the morning, at noon, and at bedtime. May substitute to any manufacturer covered by patient's insurance.   BLOOD GLUCOSE TEST STRIPS Strp 1 each by In Vitro route in the morning, at noon, and at bedtime. May substitute to any manufacturer covered by patient's insurance.   carvedilol 6.25 MG tablet Commonly known as: COREG Take 1 tablet (6.25 mg total) by mouth 2 (two) times daily.   dapagliflozin propanediol 10 MG Tabs tablet Commonly known as: FARXIGA Take 1 tablet (10 mg total) by mouth daily. Start taking on: June 27, 2023   furosemide 20 MG tablet Commonly known as: Lasix Take 1 tablet (20 mg total) by mouth daily.   isosorbide mononitrate 60 MG 24 hr tablet Commonly known as: IMDUR Take 1 tablet (60 mg total) by mouth daily. Start taking on: June 27, 2023 What changed: how much to take   Lancet Device Misc 1 each by Does not apply route in the morning, at noon, and at bedtime. May substitute to any manufacturer covered by patient's insurance.   Lancets Misc. Misc 1 each by Does not apply route in the morning, at noon, and at bedtime. May substitute to any manufacturer covered by patient's insurance.   lisinopril 10 MG tablet Commonly known as: ZESTRIL Take 1 tablet (10 mg total) by mouth daily.   nitroGLYCERIN 0.4 MG SL tablet Commonly known as: NITROSTAT Place 1 tablet (0.4 mg total) under the tongue every 5 (five) minutes x 3 doses as needed for  chest pain (If no relief after 3rd dose, proceed to ED or call 911).   ranolazine 500 MG 12 hr tablet Commonly known as: RANEXA Take 1 tablet (500 mg total) by mouth 2 (two) times daily.   rosuvastatin 20 MG tablet Commonly known as: Crestor Take 1 tablet (20 mg total) by mouth daily.        Discharge Assessment: Vitals:   06/25/23 1932 06/26/23 0344  BP: (!) 161/97 117/79  Pulse: 74 68  Resp:    Temp: 97.7 F (36.5 C) 97.7 F (36.5 C)  SpO2: 93% 96%   Skin clean, dry and intact without evidence of skin break down, no evidence of skin tears noted. IV catheter discontinued intact. Site without signs and symptoms of complications - no redness or edema noted at insertion site, patient denies c/o pain - only slight tenderness at site.  Dressing with slight pressure applied.  D/c Instructions-Education: Discharge instructions given to patient/family with verbalized understanding. D/c education completed with patient/family including follow up instructions, medication list, d/c activities limitations if indicated, with other d/c instructions as indicated by MD - patient able to verbalize understanding, all questions fully answered. Patient instructed to return to ED, call 911, or call MD for any changes in condition.  Patient escorted via WC, and D/C home via private auto.  Cristal Ford, LPN 04/08/8118 14:78 AM

## 2023-06-26 NOTE — TOC Benefit Eligibility Note (Signed)
Pharmacy Patient Advocate Encounter  Insurance verification completed.    The patient is insured through [facility]   Ran test claim for ranolazine (Ranexa) 500 mg tablets and the current 30 day co-pay is $4.00.  Ran test claim for Farxiga 10 mg and Requires Prior Authorization  Ran test claim for Jardiance 10 mg and Requires Prior Authorization   This test claim was processed through [employer]- copay amounts may vary at other pharmacies due to [employer], or as the patient moves through the different stages of their insurance plan.    Roland Earl, CPHT Pharmacy Patient Advocate Specialist [facility] Patient Advocate Team Direct Number: [phone]  Fax: [phone]

## 2023-06-26 NOTE — Discharge Summary (Signed)
Physician Discharge Summary  Steve Vang WGN:[id number] DOB: 1966/02/15 DOA: 06/24/2023  PCP: Patient, No Pcp Per  Admit date: 06/24/2023  Discharge date: 06/26/2023  Admitted From:Home  Disposition:  Home  Recommendations for Outpatient Follow-up:  Follow up with new PCP, in next 1-2 weeks Follow-up with cardiology 07/23/2023 as scheduled Continue on medications as noted below per cardiology recommendations Glucometer prescribed for close monitoring and patient now on Farxiga for blood glucose control  Home Health: None  Equipment/Devices: None  Discharge Condition:Stable  CODE STATUS: Full  Diet recommendation: Heart Healthy  Brief/Interim Summary:  57 year old gentleman smoker with significant coronary artery disease status post CABG in 2006, history of LV thrombus previously had been fully anticoagulated with warfarin, hyperlipidemia, history of CVA, ischemic cardiomyopathy, CKD, hypertension presented to the emergency department complaining of substernal chest pain radiating to left arm with exertion and resolving at rest.  He was admitted for evaluation of stable angina in the setting of prior heart disease and was also noted to have acute on chronic diastolic CHF exacerbation that required diuresis with IV Lasix.  He was seen by cardiology and has had adjustments to his medications.  He does have a new diagnosis of type 2 diabetes with hemoglobin A1c 7.5% during this hospitalization and he will remain on Farxiga as prescribed and will have a glucometer and test trips and lancets prescribed for close monitoring of home blood glucose levels.  Discharge Diagnoses:  Principal Problem:   Chest pain with high risk for cardiac etiology Active Problems:   Mixed hyperlipidemia   Tobacco abuse   Essential hypertension, benign   Coronary artery disease involving native coronary artery of native heart with angina pectoris (HCC)   Ischemic cardiomyopathy   History of CVA  (cerebrovascular accident)   Essential tremor   History of Left ventricular mural thrombus   CKD (chronic kidney disease) stage 2, GFR 60-89 ml/min   COPD (chronic obstructive pulmonary disease) (HCC)   Depression   Former smoker   Marijuana use, episodic   Hyperglycemia   Type 2 diabetes mellitus (HCC)   Acute heart failure (HCC)  Principal discharge diagnosis: Stable angina in the setting of CAD status post CABG in 2006.  Acute on chronic diastolic CHF exacerbation.  Newly diagnosed type 2 diabetes.  Discharge Instructions  Discharge Instructions     Ambulatory referral to Nutrition and Diabetic Education   Complete by: As directed    New onset DM2   Diet - low sodium heart healthy   Complete by: As directed    Increase activity slowly   Complete by: As directed       Allergies as of 06/26/2023       Reactions   Benadryl [diphenhydramine] Hives, Swelling   Chantix [varenicline] Nausea And Vomiting        Medication List     TAKE these medications    aspirin EC 81 MG tablet Take 81 mg by mouth daily.   Blood Glucose Monitoring Suppl Devi 1 each by Does not apply route in the morning, at noon, and at bedtime. May substitute to any manufacturer covered by patient's insurance.   BLOOD GLUCOSE TEST STRIPS Strp 1 each by In Vitro route in the morning, at noon, and at bedtime. May substitute to any manufacturer covered by patient's insurance.   carvedilol 6.25 MG tablet Commonly known as: COREG Take 1 tablet (6.25 mg total) by mouth 2 (two) times daily.   dapagliflozin propanediol 10 MG Tabs tablet Commonly known as:  FARXIGA Take 1 tablet (10 mg total) by mouth daily. Start taking on: June 27, 2023   furosemide 20 MG tablet Commonly known as: Lasix Take 1 tablet (20 mg total) by mouth daily.   isosorbide mononitrate 60 MG 24 hr tablet Commonly known as: IMDUR Take 1 tablet (60 mg total) by mouth daily. Start taking on: June 27, 2023 What changed: how much  to take   Lancet Device Misc 1 each by Does not apply route in the morning, at noon, and at bedtime. May substitute to any manufacturer covered by patient's insurance.   Lancets Misc. Misc 1 each by Does not apply route in the morning, at noon, and at bedtime. May substitute to any manufacturer covered by patient's insurance.   lisinopril 10 MG tablet Commonly known as: ZESTRIL Take 1 tablet (10 mg total) by mouth daily.   nitroGLYCERIN 0.4 MG SL tablet Commonly known as: NITROSTAT Place 1 tablet (0.4 mg total) under the tongue every 5 (five) minutes x 3 doses as needed for chest pain (If no relief after 3rd dose, proceed to ED or call 911).   ranolazine 500 MG 12 hr tablet Commonly known as: RANEXA Take 1 tablet (500 mg total) by mouth 2 (two) times daily.   rosuvastatin 20 MG tablet Commonly known as: Crestor Take 1 tablet (20 mg total) by mouth daily.        Follow-up Information     Sharlene Dory, NP. Go on 07/23/2023.   Specialty: Cardiology Contact information: [address] [place] [address] [phone]                Allergies  Allergen Reactions   Benadryl [Diphenhydramine] Hives and Swelling   Chantix [Varenicline] Nausea And Vomiting    Consultations: Cardiology   Procedures/Studies: ECHOCARDIOGRAM COMPLETE  Result Date: 06/24/2023    ECHOCARDIOGRAM REPORT   Patient Name:   Steve Vang Date of Exam: 06/24/2023 Medical Rec #:  [id number]    Height:       67.0 in Accession #:    [id number]   Weight:       174.2 lb Date of Birth:  1966/10/11   BSA:          1.907 m Patient Age:    56 years     BP: Patient Gender: M            HR: [facility]am [facility] Procedure: 2D Echo and Color Doppler Indications:    Chest pain  Sonographer:    BW Referring Phys: [id number] CLANFORD L JOHNSON IMPRESSIONS  1. Left ventricular ejection fraction, by estimation, is 50%. The left ventricle has low normal function. The left ventricle demonstrates regional wall  motion abnormalities (see scoring diagram/findings for description). Left ventricular diastolic parameters are consistent with Grade II diastolic dysfunction (pseudonormalization).  2. Right ventricular systolic function is mildly reduced. The right ventricular size is normal.  3. The mitral valve is grossly normal. No evidence of mitral valve regurgitation. No evidence of mitral stenosis.  4. The aortic valve has an indeterminant number of cusps. Aortic valve regurgitation is not visualized. No aortic stenosis is present. Conclusion(s)/Recommendation(s): No left ventricular mural or apical thrombus/thrombi. FINDINGS  Left Ventricle: Left ventricular ejection fraction, by estimation, is 50%. The left ventricle has low normal function. The left ventricle demonstrates regional wall motion abnormalities. The left ventricular internal cavity size was normal in size. There is no left ventricular hypertrophy. Left ventricular diastolic parameters are consistent  with Grade II diastolic dysfunction (pseudonormalization).  LV Wall Scoring: The anterior septum is hypokinetic. Right Ventricle: The right ventricular size is normal. No increase in right ventricular wall thickness. Right ventricular systolic function is mildly reduced. Left Atrium: Left atrial size was normal in size. Right Atrium: Right atrial size was normal in size. Pericardium: There is no evidence of pericardial effusion. Mitral Valve: The mitral valve is grossly normal. No evidence of mitral valve regurgitation. No evidence of mitral valve stenosis. Tricuspid Valve: The tricuspid valve is not well visualized. Tricuspid valve regurgitation is not demonstrated. No evidence of tricuspid stenosis. Aortic Valve: The aortic valve has an indeterminant number of cusps. Aortic valve regurgitation is not visualized. No aortic stenosis is present. Pulmonic Valve: The pulmonic valve was not well visualized. Pulmonic valve regurgitation is not visualized. No evidence of  pulmonic stenosis. Aorta: The aortic root is normal in size and structure. Venous: The inferior vena cava was not well visualized. IAS/Shunts: The interatrial septum was not well visualized.  LEFT VENTRICLE PLAX 2D LVIDd:         5.50 cm   Diastology LVIDs:         4.00 cm   LV e' medial:    3.59 cm/s LV PW:         1.00 cm   LV E/e' medial:  21.6 LV IVS:        1.00 cm   LV e' lateral:   7.72 cm/s LVOT diam:     1.90 cm   LV E/e' lateral: 10.0 LV SV:         53 LV SV Index:   28 LVOT Area:     2.84 cm  RIGHT VENTRICLE RV S prime:     7.40 cm/s TAPSE (M-mode): 1.4 cm LEFT ATRIUM             Index        RIGHT ATRIUM           Index LA diam:        4.50 cm 2.36 cm/m   RA Area:     15.50 cm LA Vol (A2C):   76.4 ml 40.06 ml/m  RA Volume:   38.50 ml  20.19 ml/m LA Vol (A4C):   43.4 ml 22.76 ml/m LA Biplane Vol: 63.4 ml 33.25 ml/m  AORTIC VALVE LVOT Vmax:   80.20 cm/s LVOT Vmean:  57.100 cm/s LVOT VTI:    0.186 m  AORTA Ao Root diam: 3.40 cm MITRAL VALVE MV Area (PHT): 4.21 cm    SHUNTS MV Decel Time: 180 msec    Systemic VTI:  0.19 m MV E velocity: 77.40 cm/s  Systemic Diam: 1.90 cm MV A velocity: 56.30 cm/s MV E/A ratio:  1.37 Vishnu Priya Mallipeddi Electronically signed by Winfield Rast Mallipeddi Signature Date/Time: 06/24/2023/7:21:00 PM    Final    DG Chest Portable 1 View  Result Date: 06/24/2023 CLINICAL DATA:  Chest pain. EXAM: PORTABLE CHEST 1 VIEW COMPARISON:  Chest x-ray May 10, 2020. FINDINGS: The heart size and mediastinal contours are within normal limits. Both lungs are clear. No visible pleural effusions or pneumothorax. No acute osseous abnormality. Median sternotomy. IMPRESSION: No active disease. Electronically Signed   By: Feliberto Harts M.D.   On: 06/24/2023 08:49   NM Myocar Multi W/Spect Izetta Dakin Motion / EF  Result Date: 05/28/2023   Findings are consistent with large extensive apical infarction. Intermediate to high risk study based on large area of scar and decreased  LVEF. There  is no current myocardium at jeopardy.  Consider correlating LVEF with echo.   No ST deviation was noted.   LV perfusion is abnormal. Large severe intensity fixed extensive apical defect consistent with large area of scar. There is no current ischemia.   Left ventricular function is abnormal. Nuclear stress EF: 35 %. The left ventricular ejection fraction is moderately decreased (30-44%). End diastolic cavity size is severely enlarged.     Discharge Exam: Vitals:   06/25/23 1932 06/26/23 0344  BP: (!) 161/97 117/79  Pulse: 74 68  Resp:    Temp: 97.7 F (36.5 C) 97.7 F (36.5 C)  SpO2: 93% 96%   Vitals:   06/25/23 0411 06/25/23 1430 06/25/23 1932 06/26/23 0344  BP: (!) 166/99 (!) 149/81 (!) 161/97 117/79  Pulse: 61 78 74 68  Resp: 20 19    Temp: 97.7 F (36.5 C) 98 F (36.7 C) 97.7 F (36.5 C) 97.7 F (36.5 C)  TempSrc: Oral Oral Oral Oral  SpO2: 98% 97% 93% 96%  Weight: 77.2 kg   75.3 kg  Height:        General: Pt is alert, awake, not in acute distress Cardiovascular: RRR, S1/S2 +, no rubs, no gallops Respiratory: CTA bilaterally, no wheezing, no rhonchi Abdominal: Soft, NT, ND, bowel sounds + Extremities: no edema, no cyanosis    The results of significant diagnostics from this hospitalization (including imaging, microbiology, ancillary and laboratory) are listed below for reference.     Microbiology: No results found for this or any previous visit (from the past 240 hour(s)).   Labs: BNP (last 3 results) Recent Labs    06/24/23 0825  BNP 72.0   Basic Metabolic Panel: Recent Labs  Lab 06/24/23 0825 06/24/23 0858 06/25/23 0418 06/25/23 1842 06/26/23 0410  NA 135 140 136 137 135  K 3.8 3.9 3.9 4.8 3.7  CL 107 109 106 98 101  CO2 22  --  21* 29 22  GLUCOSE 178* 168* 109* 106* 123*  BUN 20 22* 18 18 21*  CREATININE 0.99 1.00 0.97 1.32* 1.09  CALCIUM 8.9  --  8.7* 9.9 9.6  MG 1.9  --  1.9  --  2.0   Liver Function Tests: Recent Labs  Lab  06/24/23 0825  AST 25  ALT 40  ALKPHOS 77  BILITOT 0.4  PROT 7.2  ALBUMIN 4.0   Recent Labs  Lab 06/24/23 0825  LIPASE 38   No results for input(s): "AMMONIA" in the last 168 hours. CBC: Recent Labs  Lab 06/24/23 0825 06/24/23 0858 06/25/23 0418 06/26/23 0410  WBC 5.7  --  5.7 6.4  NEUTROABS 2.9  --   --   --   HGB 15.3 15.6 15.4 16.6  HCT 45.7 46.0 46.6 48.5  MCV 92.1  --  93.0 91.0  PLT 192  --  169 190   Cardiac Enzymes: No results for input(s): "CKTOTAL", "CKMB", "CKMBINDEX", "TROPONINI" in the last 168 hours. BNP: Invalid input(s): "POCBNP" CBG: Recent Labs  Lab 06/25/23 0722 06/25/23 1140 06/25/23 1630 06/25/23 2205 06/26/23 0721  GLUCAP 145* 187* 90 117* 127*   D-Dimer No results for input(s): "DDIMER" in the last 72 hours. Hgb A1c Recent Labs    06/24/23 1257  HGBA1C 7.4*   Lipid Profile Recent Labs    06/25/23 0418  CHOL 161  HDL 38*  LDLCALC 93  TRIG 914  CHOLHDL 4.2   Thyroid function studies Recent Labs    06/24/23 1010  TSH 1.559   Anemia work up No results for input(s): "VITAMINB12", "FOLATE", "FERRITIN", "TIBC", "IRON", "RETICCTPCT" in the last 72 hours. Urinalysis    Component Value Date/Time   COLORURINE YELLOW 01/31/2017 0820   APPEARANCEUR HAZY (A) 01/31/2017 0820   LABSPEC 1.017 01/31/2017 0820   PHURINE 5.0 01/31/2017 0820   GLUCOSEU NEGATIVE 01/31/2017 0820   HGBUR LARGE (A) 01/31/2017 0820   BILIRUBINUR NEGATIVE 01/31/2017 0820   KETONESUR NEGATIVE 01/31/2017 0820   PROTEINUR 100 (A) 01/31/2017 0820   UROBILINOGEN 0.2 09/29/2015 1850   NITRITE NEGATIVE 01/31/2017 0820   LEUKOCYTESUR NEGATIVE 01/31/2017 0820   Sepsis Labs Recent Labs  Lab 06/24/23 0825 06/25/23 0418 06/26/23 0410  WBC 5.7 5.7 6.4   Microbiology No results found for this or any previous visit (from the past 240 hour(s)).   Time coordinating discharge: 35 minutes  SIGNED:   Erick Blinks, DO [facility] 06/26/2023, 10:22  AM  If 7PM-7AM, please contact night-coverage [email or web address]

## 2023-06-26 NOTE — Progress Notes (Signed)
Progress Note  Patient Name: Steve Vang Date of Encounter: 06/26/2023  Primary Cardiologist: [name], MD  Subjective   No chest pains. Echocardiogram on admission showed LVEF 50%.  DOE completely resolved with 1 dose of IV Lasix yesterday.  Mild elevation of serum creatinine yesterday evening which resolved this morning.  Inpatient Medications    Scheduled Meds:  aspirin EC  81 mg Oral Daily   carvedilol  6.25 mg Oral BID   dapagliflozin propanediol  10 mg Oral Daily   heparin  5,000 Units Subcutaneous Q8H   insulin aspart  0-9 Units Subcutaneous TID WC   isosorbide mononitrate  60 mg Oral Daily   lisinopril  10 mg Oral Daily   ranolazine  500 mg Oral BID   rosuvastatin  20 mg Oral QPM   Continuous Infusions:  PRN Meds: acetaminophen **OR** acetaminophen, morphine injection, nicotine, nitroGLYCERIN, ondansetron **OR** ondansetron (ZOFRAN) IV, oxyCODONE, senna-docusate, traZODone   Vital Signs    Vitals:   06/25/23 0411 06/25/23 1430 06/25/23 1932 06/26/23 0344  BP: (!) 166/99 (!) 149/81 (!) 161/97 117/79  Pulse: 61 78 74 68  Resp: 20 19    Temp: 97.7 F (36.5 C) 98 F (36.7 C) 97.7 F (36.5 C) 97.7 F (36.5 C)  TempSrc: Oral Oral Oral Oral  SpO2: 98% 97% 93% 96%  Weight: 77.2 kg   75.3 kg  Height:        Intake/Output Summary (Last 24 hours) at 06/26/2023 0953 Last data filed at 06/25/2023 1700 Gross per 24 hour  Intake 840 ml  Output --  Net 840 ml   Filed Weights   06/24/23 1442 06/25/23 0411 06/26/23 0344  Weight: 76.1 kg 77.2 kg 75.3 kg    Telemetry     Personally reviewed, NSR, no alarms.   Physical Exam   GEN: No acute distress.   Neck: JVD not examined Cardiac: RRR, no murmur, rub, or gallop.  Respiratory: Nonlabored. Clear to auscultation bilaterally. GI: Soft, nontender, bowel sounds present. MS: No edema; No deformity. Neuro:  Nonfocal. Psych: Alert and oriented x 3. Normal affect.  Labs    Chemistry Recent Labs  Lab  06/24/23 0825 06/24/23 0858 06/25/23 0418 06/25/23 1842 06/26/23 0410  NA 135   < > 136 137 135  K 3.8   < > 3.9 4.8 3.7  CL 107   < > 106 98 101  CO2 22  --  21* 29 22  GLUCOSE 178*   < > 109* 106* 123*  BUN 20   < > 18 18 21*  CREATININE 0.99   < > 0.97 1.32* 1.09  CALCIUM 8.9  --  8.7* 9.9 9.6  PROT 7.2  --   --   --   --   ALBUMIN 4.0  --   --   --   --   AST 25  --   --   --   --   ALT 40  --   --   --   --   ALKPHOS 77  --   --   --   --   BILITOT 0.4  --   --   --   --   GFRNONAA >60  --  >60 >60 >60  ANIONGAP 6  --  9 10 12    < > = values in this interval not displayed.     Hematology Recent Labs  Lab 06/24/23 0825 06/24/23 0858 06/25/23 0418 06/26/23 0410  WBC 5.7  --  5.7 6.4  RBC 4.96  --  5.01 5.33  HGB 15.3 15.6 15.4 16.6  HCT 45.7 46.0 46.6 48.5  MCV 92.1  --  93.0 91.0  MCH 30.8  --  30.7 31.1  MCHC 33.5  --  33.0 34.2  RDW 13.2  --  13.2 13.2  PLT 192  --  169 190    Cardiac Enzymes Recent Labs  Lab 06/24/23 0825 06/24/23 1010  TROPONINIHS 30* 24*    BNP Recent Labs  Lab 06/24/23 0825  BNP 72.0     DDimerNo results for input(s): "DDIMER" in the last 168 hours.   Radiology    ECHOCARDIOGRAM COMPLETE  Result Date: 06/24/2023    ECHOCARDIOGRAM REPORT   Patient Name:   Steve Vang Date of Exam: 06/24/2023 Medical Rec #:  [id number]    Height:       67.0 in Accession #:    [id number]   Weight:       174.2 lb Date of Birth:  [birth year]   BSA:          1.907 m Patient Age:    56 years     BP: Patient Gender: M            HR: Exam [facility] Procedure: 2D Echo and Color Doppler Indications:    Chest pain  Sonographer:    BW Referring Phys: [id number] CLANFORD L JOHNSON IMPRESSIONS  1. Left ventricular ejection fraction, by estimation, is 50%. The left ventricle has low normal function. The left ventricle demonstrates regional wall motion abnormalities (see scoring diagram/findings for description). Left ventricular diastolic parameters are  consistent with Grade II diastolic dysfunction (pseudonormalization).  2. Right ventricular systolic function is mildly reduced. The right ventricular size is normal.  3. The mitral valve is grossly normal. No evidence of mitral valve regurgitation. No evidence of mitral stenosis.  4. The aortic valve has an indeterminant number of cusps. Aortic valve regurgitation is not visualized. No aortic stenosis is present. Conclusion(s)/Recommendation(s): No left ventricular mural or apical thrombus/thrombi. FINDINGS  Left Ventricle: Left ventricular ejection fraction, by estimation, is 50%. The left ventricle has low normal function. The left ventricle demonstrates regional wall motion abnormalities. The left ventricular internal cavity size was normal in size. There is no left ventricular hypertrophy. Left ventricular diastolic parameters are consistent with Grade II diastolic dysfunction (pseudonormalization).  LV Wall Scoring: The anterior septum is hypokinetic. Right Ventricle: The right ventricular size is normal. No increase in right ventricular wall thickness. Right ventricular systolic function is mildly reduced. Left Atrium: Left atrial size was normal in size. Right Atrium: Right atrial size was normal in size. Pericardium: There is no evidence of pericardial effusion. Mitral Valve: The mitral valve is grossly normal. No evidence of mitral valve regurgitation. No evidence of mitral valve stenosis. Tricuspid Valve: The tricuspid valve is not well visualized. Tricuspid valve regurgitation is not demonstrated. No evidence of tricuspid stenosis. Aortic Valve: The aortic valve has an indeterminant number of cusps. Aortic valve regurgitation is not visualized. No aortic stenosis is present. Pulmonic Valve: The pulmonic valve was not well visualized. Pulmonic valve regurgitation is not visualized. No evidence of pulmonic stenosis. Aorta: The aortic root is normal in size and structure. Venous: The inferior vena cava was  not well visualized. IAS/Shunts: The interatrial septum was not well visualized.  LEFT VENTRICLE PLAX 2D LVIDd:         5.50 cm   Diastology LVIDs:  4.00 cm   LV e' medial:    3.59 cm/s LV PW:         1.00 cm   LV E/e' medial:  21.6 LV IVS:        1.00 cm   LV e' lateral:   7.72 cm/s LVOT diam:     1.90 cm   LV E/e' lateral: 10.0 LV SV:         53 LV SV Index:   28 LVOT Area:     2.84 cm  RIGHT VENTRICLE RV S prime:     7.40 cm/s TAPSE (M-mode): 1.4 cm LEFT ATRIUM             Index        RIGHT ATRIUM           Index LA diam:        4.50 cm 2.36 cm/m   RA Area:     15.50 cm LA Vol (A2C):   76.4 ml 40.06 ml/m  RA Volume:   38.50 ml  20.19 ml/m LA Vol (A4C):   43.4 ml 22.76 ml/m LA Biplane Vol: 63.4 ml 33.25 ml/m  AORTIC VALVE LVOT Vmax:   80.20 cm/s LVOT Vmean:  57.100 cm/s LVOT VTI:    0.186 m  AORTA Ao Root diam: 3.40 cm MITRAL VALVE MV Area (PHT): 4.21 cm    SHUNTS MV Decel Time: 180 msec    Systemic VTI:  0.19 m MV E velocity: 77.40 cm/s  Systemic Diam: 1.90 cm MV A velocity: 56.30 cm/s MV E/A ratio:  1.37 Janayah Zavada Priya Myosha Cuadras Electronically signed by Winfield Rast Rayvon Dakin Signature Date/Time: 06/24/2023/7:21:00 PM    Final     Assessment & Plan    Patient  is a 57 y/o M known to have CAD s/p CABG in 2006 with LVEF 50%, history of LV thrombus, history of CVA, CKD, HTN, HLD presented to the ER with chest pain.   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 However, a couple of months ago, he started to have more frequent chest pains, 2 times per month only with strenuous exercise. Otherwise, he is physically active at baseline, can perform his household chores, swim and climb stairs with no symptoms of angina. EKG showed NSR, no new ischemia. High-sensitivity troponins were mildly elevated, 24 and 30 (similar to values from 3 years ago). NM stress test from 5/24 showed a large  extensive apical infarction and no evidence of ischemia. NM LVEF was 35%. Echocardiogram on admission showed LVEF 50%.  Will continue current antianginal therapy, Imdur 60 mg once daily (no headaches with a 60 mg dose but he had severe headaches with the 90 mg dose), continue ranolazine 500 mg twice daily (started this hospitalization), continue carvedilol 6.25 mg twice daily. -Continue cardioprotective medications, aspirin 81 mg once daily and rosuvastatin 20 mg nightly.  # Acute on chronic diastolic heart failure exacerbation, compensated -Received IV Lasix 40 mg yesterday with mild elevation of serum creatinine yesterday evening.  DOE completely resolved this a.m.  Will switch IV to p.o. Lasix 20 mg daily. -Continue p.o. Farxiga 10 mg once daily. -Treat underlying risk factors, DM 2 which is newly diagnosed in this hospitalization.  # Newly diagnosed DM 2 this hospitalization, HbA1c 7.5 -Continue Farxiga 10 mg once daily, outpatient management of DM 2.  He will need PCP referral upon discharge.  #  HTN, continue above medications # HLD, continue rosuvastatin 20 mg nightly, goal LDL less than 70.   [facility] will sign off.   Medication Recommendations: Aspirin 81 mg once daily, rosuvastatin 20 mg nightly, Imdur 60 mg once daily (dose decreased from 90 mg), ranolazine 500 mg twice daily (new medication this hospitalization), carvedilol 6.25 mg twice daily, p.o. Lasix 20 mg once daily and p.o. Farxiga 10 mg once daily. Other recommendations (labs, testing, etc): PCP referral Follow up as an outpatient: Keep appointment with Sharlene Dory, cardiology NP on 07/23/2023.   I have spent a total of 30 minutes with patient reviewing chart , telemetry, EKGs, labs and examining patient as well as establishing an assessment and plan that was discussed with the patient.  > 50% of time was spent in direct patient care.     Signed, Marjo Bicker, MD  06/26/2023, 9:53 AM

## 2023-06-26 NOTE — Progress Notes (Signed)
No events overnight, up to bathroom no assist needed, family at bedside.

## 2023-06-27 ENCOUNTER — Telehealth: Payer: Self-pay | Admitting: Nurse Practitioner

## 2023-06-27 ENCOUNTER — Telehealth: Payer: Self-pay

## 2023-06-27 ENCOUNTER — Other Ambulatory Visit (HOSPITAL_COMMUNITY): Payer: Self-pay

## 2023-06-27 NOTE — Telephone Encounter (Signed)
Pharmacy Patient Advocate Encounter   Received notification from [employer] that prior authorization for FARXIGA 10 MG  is needed.    PA submitted on 06/27/23 Key [id number] Status is pending  Haze Rushing, CPhT Pharmacy Patient Advocate Specialist Direct Number: [phone] Fax: [phone]

## 2023-06-27 NOTE — Telephone Encounter (Signed)
Pharmacy Patient Advocate Encounter  Prior Authorization for Marcelline Deist has been approved.    PA# [id number] Effective dates: 06/27/23 through 06/26/24  Spoke with Pharmacy to process.  Haze Rushing, CPhT Pharmacy Patient Advocate Specialist Direct Number: [phone] Fax: [phone]

## 2023-06-27 NOTE — Telephone Encounter (Signed)
Patient notified and verbalized understanding. 

## 2023-06-27 NOTE — Telephone Encounter (Signed)
PA initiated, please see separate encounter for updates on determination. (I will route you back in once a decision has been made)  Lorenso Quirino, CPhT Pharmacy Patient Advocate Specialist Direct Number: [phone] Fax: [phone]  

## 2023-06-27 NOTE — Telephone Encounter (Signed)
Patient was discharged on 06/26/2023 from [facility]. The prescription for Farxiga 10 mg has been denied by [facility]. States that they need a pre-authorization before the medication can be filled. Can another medication be called in"?  [facility] - [place], [place]    Patient has  Medicaid

## 2023-07-01 ENCOUNTER — Encounter: Payer: Self-pay | Admitting: Internal Medicine

## 2023-07-01 ENCOUNTER — Ambulatory Visit: Payer: Medicaid Other | Admitting: Internal Medicine

## 2023-07-01 VITALS — BP 110/69 | HR 64 | Ht 67.0 in | Wt 163.0 lb

## 2023-07-01 DIAGNOSIS — I10 Essential (primary) hypertension: Secondary | ICD-10-CM

## 2023-07-01 DIAGNOSIS — I5032 Chronic diastolic (congestive) heart failure: Secondary | ICD-10-CM | POA: Diagnosis not present

## 2023-07-01 DIAGNOSIS — F17200 Nicotine dependence, unspecified, uncomplicated: Secondary | ICD-10-CM | POA: Diagnosis not present

## 2023-07-01 DIAGNOSIS — Z1211 Encounter for screening for malignant neoplasm of colon: Secondary | ICD-10-CM

## 2023-07-01 DIAGNOSIS — I2089 Other forms of angina pectoris: Secondary | ICD-10-CM

## 2023-07-01 DIAGNOSIS — E1165 Type 2 diabetes mellitus with hyperglycemia: Secondary | ICD-10-CM | POA: Diagnosis not present

## 2023-07-01 DIAGNOSIS — Z720 Tobacco use: Secondary | ICD-10-CM

## 2023-07-01 MED ORDER — ROSUVASTATIN CALCIUM 40 MG PO TABS
40.0000 mg | ORAL_TABLET | Freq: Every day | ORAL | 3 refills | Status: DC
Start: 2023-07-01 — End: 2024-08-04

## 2023-07-01 MED ORDER — NICOTINE 21 MG/24HR TD PT24
21.0000 mg | MEDICATED_PATCH | Freq: Every day | TRANSDERMAL | 1 refills | Status: DC
Start: 2023-07-01 — End: 2023-11-26

## 2023-07-01 NOTE — Progress Notes (Signed)
     HPI:Mr.Steve Vang is a 57 y.o. male who presents for evaluation of ***. For the details of today's visit, please refer to the assessment and plan.     Depression, PHQ-9: Based on the patients  Flowsheet Row Office Visit from 07/01/2023 in [facility]  PHQ-9 Total Score 5      score we have ***.  Past Medical History:  Diagnosis Date   CAD (coronary artery disease)    Multivessel, LVEF 45-50%   Cardiomyopathy (HCC)    LVEF 50-55% December 2016   CKD (chronic kidney disease) stage 2, GFR 60-89 ml/min    COPD (chronic obstructive pulmonary disease) (HCC)    Essential hypertension    History of stroke 2004   Hyperlipidemia    Left ventricular mural thrombus    On Coumadin   MDD in remission    Not    Past Surgical History:  Procedure Laterality Date   CORONARY ARTERY BYPASS GRAFT     DOR anterior ventricular restoration surgery 8/06, [facility] to first diagonal, SVG to PLB, SVG to RVE branch of nondominant RCA   EYE SURGERY     TOTAL HIP ARTHROPLASTY Left 06/08/2016   Procedure: TOTAL HIP ARTHROPLASTY ANTERIOR APPROACH;  Surgeon: Kathryne Hitch, MD;  Location: [facility];  Service: Orthopedics;  Laterality: Left;   TOTAL HIP ARTHROPLASTY Right 07/20/2016   Procedure: RIGHT TOTAL HIP ARTHROPLASTY ANTERIOR APPROACH;  Surgeon: Kathryne Hitch, MD;  Location: [facility];  Service: Orthopedics;  Laterality: Right;    Family History  Problem Relation Age of Onset   Hypertension Mother    Diabetes Mother     Social History   Tobacco Use   Smoking status: Former    Packs/day: 1.00    Years: 30.00    Additional pack years: 0.00    Total pack years: 30.00    Types: Cigarettes    Quit date: 2022    Years since quitting: 2.4   Smokeless tobacco: Never  Vaping Use   Vaping Use: Never used  Substance Use Topics   Alcohol use: No    Alcohol/week: 0.0 standard drinks of alcohol    Comment: hx of alcohol use    Drug use:  Yes    Types: Marijuana    Comment:  marijuana use    Review of Systems:    ROS   Physical Exam: Vitals:   07/01/23 1309  BP: 110/69  Pulse: 64  SpO2: 94%  Weight: 163 lb (73.9 kg)  Height: 5\' 7"  (1.702 m)     Physical Exam   Assessment & Plan:   No problem-specific Assessment & Plan notes found for this encounter.    Milus Banister, MD

## 2023-07-01 NOTE — Patient Instructions (Signed)
Thank you, Mr.Odean H Strothers for allowing [place] to provide your care today.   I have ordered the following labs for you:  Lab Orders         Cologuard         BMP8+EGFR         Magnesium         Referrals ordered today:   Referral Orders         Ambulatory Referral Lung Cancer Screening [facility]        Reminders: Go by the lab. Follow up in 3 months.    Thurmon Fair, M.D.

## 2023-07-02 LAB — BMP8+EGFR
BUN/Creatinine Ratio: 26 — ABNORMAL HIGH (ref 9–20)
BUN: 52 mg/dL — ABNORMAL HIGH (ref 6–24)
CO2: 21 mmol/L (ref 20–29)
Calcium: 10.2 mg/dL (ref 8.7–10.2)
Chloride: 102 mmol/L (ref 96–106)
Creatinine, Ser: 1.98 mg/dL — ABNORMAL HIGH (ref 0.76–1.27)
Glucose: 92 mg/dL (ref 70–99)
Potassium: 5.4 mmol/L — ABNORMAL HIGH (ref 3.5–5.2)
Sodium: 137 mmol/L (ref 134–144)
eGFR: 39 mL/min/{1.73_m2} — ABNORMAL LOW (ref >59)

## 2023-07-02 LAB — MAGNESIUM: Magnesium: 2.1 mg/dL (ref 1.6–2.3)

## 2023-07-02 NOTE — Progress Notes (Addendum)
Called patient. He left to go out of town after appointment for the weekend. Creatinine is elevated and he has mild hyperkalemia. I have asked patient to call and schedule follow up appointment for Monday.  He will hold lisinopril . Hold Lasix.  Follow up on Monday.

## 2023-07-03 ENCOUNTER — Encounter: Payer: Self-pay | Admitting: Internal Medicine

## 2023-07-03 DIAGNOSIS — F17200 Nicotine dependence, unspecified, uncomplicated: Secondary | ICD-10-CM | POA: Insufficient documentation

## 2023-07-03 DIAGNOSIS — I2089 Other forms of angina pectoris: Secondary | ICD-10-CM | POA: Insufficient documentation

## 2023-07-03 DIAGNOSIS — Z1211 Encounter for screening for malignant neoplasm of colon: Secondary | ICD-10-CM | POA: Insufficient documentation

## 2023-07-03 DIAGNOSIS — I5032 Chronic diastolic (congestive) heart failure: Secondary | ICD-10-CM | POA: Insufficient documentation

## 2023-07-03 NOTE — Assessment & Plan Note (Addendum)
Patient on Farxiga for HFpEF and T2DM. Also discharged on Lasix 20 mg daily Euvolemic on exam. Weight today 163 lb (73.9kg), down 2 lbs since discharge Chronic problem, stable Check BMP and Mg

## 2023-07-03 NOTE — Assessment & Plan Note (Signed)
BP: 110/69    He is taking Imdur 60 mg daily ,carvedilol 6.25 mg twice daily, lisinopril 10 mg daily Chronic problem, controlled Continue current regimen.

## 2023-07-03 NOTE — Assessment & Plan Note (Signed)
NM stress test 05/2023 showed no new ischemic changes, he has chronic apical wall motion abnormalitie from history of STEMI. CABG in 2006 Patient started on Ranexa 500 mg twice daily during hospital admission.  Continued on Imdur 60 mg daily also Coreg 6.25 mg twice daily.  He is on aspirin and statin.  He has SL nitroglycerin as needed. Chronic problem, stable Continue current regimen Follow-up with cardiology in July scheduled

## 2023-07-03 NOTE — Assessment & Plan Note (Signed)
Smokes about 1 pack/day. Has stopped and restarted . Smoking Cigars when not smoking cigarettes.   Asked about quitting: confirms that he currently smokes cigarettes Advise to quit smoking: Educated about QUITTING to reduce the risk of cancer, cardio and cerebrovascular disease. Assess willingness: willing to quit at this time Assist with counseling and pharmacotherapy: Counseled for 5 minutes  and prescribed nicotine patch 21 mcg for 8 weeks.  Arrange for follow up: follow up in office in 3 months. Will start step down therapy when patient needs refill of nicotine patch

## 2023-07-03 NOTE — Assessment & Plan Note (Signed)
Hemoglobin A1c 7.5 on 06/20/2023.  New diagnosis.  Started on Farxiga given history of HFpEF. Continue Farxiga Follow up in 3 months

## 2023-07-07 ENCOUNTER — Ambulatory Visit: Payer: Medicaid Other | Admitting: Internal Medicine

## 2023-07-08 ENCOUNTER — Encounter: Payer: Self-pay | Admitting: Internal Medicine

## 2023-07-08 ENCOUNTER — Ambulatory Visit: Payer: Medicaid Other

## 2023-07-08 ENCOUNTER — Ambulatory Visit (INDEPENDENT_AMBULATORY_CARE_PROVIDER_SITE_OTHER): Payer: Medicaid Other | Admitting: Internal Medicine

## 2023-07-08 VITALS — BP 116/80 | HR 76 | Ht 67.0 in | Wt 163.1 lb

## 2023-07-08 DIAGNOSIS — E875 Hyperkalemia: Secondary | ICD-10-CM | POA: Diagnosis not present

## 2023-07-08 DIAGNOSIS — I10 Essential (primary) hypertension: Secondary | ICD-10-CM

## 2023-07-08 DIAGNOSIS — N179 Acute kidney failure, unspecified: Secondary | ICD-10-CM | POA: Insufficient documentation

## 2023-07-08 NOTE — Patient Instructions (Signed)
Thank you, Mr.Denver H Wilkowski for allowing [place] to provide your care today.   I have ordered the following labs for you:   Lab Orders         BMP8+EGFR         Microalbumin / creatinine urine ratio       Reminders: Go by the lab.     Thurmon Fair, M.D.

## 2023-07-08 NOTE — Progress Notes (Signed)
   HPI:Mr.Steve Vang is a 57 y.o. male who presents for follow up of acute kidney injury with hyperkalemia.  Patient seen on 7/1 and creatinine 1.98 and potassium 5.4. He went out of town and was schedule for follow up this morning. He is asymptomatic. Having normal urination. He has not taken lisinopril or lasix.    Physical Exam: Vitals:   07/08/23 0857  BP: 116/80  Pulse: 76  SpO2: 97%  Weight: 163 lb 1.9 oz (74 kg)  Height: 5\' 7"  (1.702 m)     Physical Exam Constitutional:      General: He is not in acute distress.    Appearance: He is not ill-appearing.  HENT:     Mouth/Throat:     Mouth: Mucous membranes are moist.  Cardiovascular:     Rate and Rhythm: Normal rate and regular rhythm.     Heart sounds: No murmur heard. Pulmonary:     Effort: Pulmonary effort is normal.     Breath sounds: No wheezing or rales.  Musculoskeletal:     Right lower leg: No edema.     Left lower leg: No edema.      Assessment & Plan:   Steve Vang was seen today for follow-up.  AKI (acute kidney injury) (HCC) Assessment & Plan: I estimate baseline Cr to be 1 to 1.3. Cr 1.98. Bun  He recently was started on Jardiance for HFpEF and on lasix. Appears euvolemic on exam today off of lasix.  Will check BMP today Continue holding lasix and lisinopril  Orders: -     BMP8+EGFR -     Microalbumin / creatinine urine ratio  Hyperkalemia Assessment & Plan: Assessment/Plan: Acute uncomplicated illness Holding lisinopril  Check BMP  Orders: -     BMP8+EGFR  Essential hypertension, benign Overview: Qualifier: Diagnosis of  By: Diona Browner, MD, Hollace Hayward   Assessment & Plan: BP 116/90 Chronic problem with complication of treatment Holding lisinopril due to AKI and hyperkalemia Continue Imdur 60 mg daily, carvedilol 6.25 mg daily        Milus Banister, MD

## 2023-07-08 NOTE — Assessment & Plan Note (Signed)
I estimate baseline Cr to be 1 to 1.3. Cr 1.98. Bun  He recently was started on Jardiance for HFpEF and on lasix. Appears euvolemic on exam today off of lasix.  Will check BMP today Continue holding lasix and lisinopril

## 2023-07-08 NOTE — Assessment & Plan Note (Signed)
Assessment/Plan: Acute uncomplicated illness Holding lisinopril  Check BMP

## 2023-07-08 NOTE — Assessment & Plan Note (Signed)
BP 116/90 Chronic problem with complication of treatment Holding lisinopril due to AKI and hyperkalemia Continue Imdur 60 mg daily, carvedilol 6.25 mg daily

## 2023-07-09 LAB — BMP8+EGFR
BUN/Creatinine Ratio: 21 — ABNORMAL HIGH (ref 9–20)
CO2: 19 mmol/L — ABNORMAL LOW (ref 20–29)
Calcium: 9.9 mg/dL (ref 8.7–10.2)
Creatinine, Ser: 1.65 mg/dL — ABNORMAL HIGH (ref 0.76–1.27)
Glucose: 132 mg/dL — ABNORMAL HIGH (ref 70–99)
Sodium: 135 mmol/L (ref 134–144)

## 2023-07-10 LAB — MICROALBUMIN / CREATININE URINE RATIO
Creatinine, Urine: 113.4 mg/dL
Microalb/Creat Ratio: 18 mg/g creat (ref 0–29)
Microalbumin, Urine: 20.6 ug/mL

## 2023-07-10 LAB — BMP8+EGFR
BUN: 35 mg/dL — ABNORMAL HIGH (ref 6–24)
Chloride: 104 mmol/L (ref 96–106)
Potassium: 5 mmol/L (ref 3.5–5.2)
eGFR: 48 mL/min/{1.73_m2} — ABNORMAL LOW (ref >59)

## 2023-07-11 ENCOUNTER — Telehealth: Payer: Self-pay | Admitting: Internal Medicine

## 2023-07-11 LAB — COLOGUARD: COLOGUARD: POSITIVE — AB

## 2023-07-11 NOTE — Telephone Encounter (Signed)
Called patient. Cr trending down, but not back to baseline. Recommend he continue to hold lisinopril and lasix at this time. Jardiance and Lasix may cause dehydration in this patient. He has follow up with cardiology on 7/23.

## 2023-07-15 ENCOUNTER — Other Ambulatory Visit: Payer: Self-pay | Admitting: Internal Medicine

## 2023-07-15 DIAGNOSIS — R195 Other fecal abnormalities: Secondary | ICD-10-CM

## 2023-07-15 NOTE — Progress Notes (Signed)
Positive Cologuard. Referral placed to GI for colonoscopy.

## 2023-07-23 ENCOUNTER — Ambulatory Visit: Payer: Medicaid Other | Attending: Nurse Practitioner | Admitting: Nurse Practitioner

## 2023-07-23 ENCOUNTER — Encounter: Payer: Self-pay | Admitting: Nurse Practitioner

## 2023-07-23 VITALS — BP 114/78 | HR 69 | Ht 67.0 in | Wt 167.2 lb

## 2023-07-23 DIAGNOSIS — J449 Chronic obstructive pulmonary disease, unspecified: Secondary | ICD-10-CM

## 2023-07-23 DIAGNOSIS — Z8673 Personal history of transient ischemic attack (TIA), and cerebral infarction without residual deficits: Secondary | ICD-10-CM

## 2023-07-23 DIAGNOSIS — I513 Intracardiac thrombosis, not elsewhere classified: Secondary | ICD-10-CM

## 2023-07-23 DIAGNOSIS — E782 Mixed hyperlipidemia: Secondary | ICD-10-CM | POA: Diagnosis not present

## 2023-07-23 DIAGNOSIS — I25119 Atherosclerotic heart disease of native coronary artery with unspecified angina pectoris: Secondary | ICD-10-CM

## 2023-07-23 DIAGNOSIS — I5032 Chronic diastolic (congestive) heart failure: Secondary | ICD-10-CM | POA: Diagnosis not present

## 2023-07-23 DIAGNOSIS — Z720 Tobacco use: Secondary | ICD-10-CM

## 2023-07-23 DIAGNOSIS — N179 Acute kidney failure, unspecified: Secondary | ICD-10-CM

## 2023-07-23 DIAGNOSIS — M25552 Pain in left hip: Secondary | ICD-10-CM

## 2023-07-23 DIAGNOSIS — H93A2 Pulsatile tinnitus, left ear: Secondary | ICD-10-CM

## 2023-07-23 DIAGNOSIS — M25551 Pain in right hip: Secondary | ICD-10-CM

## 2023-07-23 MED ORDER — CARVEDILOL 6.25 MG PO TABS: 6.2500 mg | ORAL_TABLET | Freq: Two times a day (BID) | ORAL | 1 refills | Status: DC

## 2023-07-23 MED ORDER — DAPAGLIFLOZIN PROPANEDIOL 10 MG PO TABS: 10.0000 mg | ORAL_TABLET | Freq: Every day | ORAL | 5 refills | Status: AC

## 2023-07-23 MED ORDER — ISOSORBIDE MONONITRATE ER 60 MG PO TB24: 60.0000 mg | ORAL_TABLET | Freq: Every day | ORAL | 4 refills | Status: DC

## 2023-07-23 MED ORDER — RANOLAZINE ER 500 MG PO TB12: 500.0000 mg | ORAL_TABLET | Freq: Two times a day (BID) | ORAL | 4 refills | Status: DC

## 2023-07-23 NOTE — Patient Instructions (Addendum)
Medication Instructions:  Your physician has recommended you make the following change in your medication:  Continue to hold Lisinopril and Lasix Continue all other medications as prescribed   Labwork: BMET in 1 week at [facility]   Testing/Procedures: Your physician has requested that you have a carotid duplex. This test is an ultrasound of the carotid arteries in your neck. It looks at blood flow through these arteries that supply the brain with blood. Allow one hour for this exam. There are no restrictions or special instructions.   Follow-Up: Your physician recommends that you schedule a follow-up appointment in:2-3 Months with Philis Nettle    Any Other Special Instructions Will Be Listed Below (If Applicable).  If you need a refill on your cardiac medications before your next appointment, please call your pharmacy.

## 2023-07-23 NOTE — Progress Notes (Signed)
Office Visit    Patient Name: Steve Vang Date of Encounter: 07/23/2023 PCP:  Billie Lade, MD [facility]  Cardiologist:  [name], MD  Advanced Practice Provider:  No care team member to display Electrophysiologist:  None  0746} Chief Complaint and HPI    Steve Vang is a 57 y.o. male with a hx of multivessel CAD, s/p CABG in 2006, HFimpEF, past history of LV mural thrombus, hypertension, mixed hyperlipidemia, T2DM, prior history of CVA, COPD, positive cologuard, and CKD, who presents today for 6-8 week follow-up.  Last seen by Dr. Diona Browner on May 17, 2023.  He reported exertional chest tightness, left arm discomfort consistent with angina.  Symptoms resolved with rest, denied any nitroglycerin use.  Imdur was increased to 60 mg daily.  Myoview was arranged and revealed evidence of prior MI, study was deemed intermediate to high risk based on large area of scar along apical area with decreased LVEF, no current ischemia noted during testing.   I last saw patient on June 03, 2023.  He noted improvement in his chest pain.  Denied any nitroglycerin use. Denied any shortness of breath, palpitations, syncope, presyncope, dizziness, orthopnea, PND, swelling or significant weight changes, acute bleeding, or claudication.  Imdur was increased to 90 mg daily.  Hospital admission for evaluation of stable angina, noted to have acute on chronic diastolic CHF exacerbation, required IV Lasix.  Cardiology saw patient and adjusted medications.  Newly diagnosed with type 2 diabetes.  Echocardiogram revealed improved EF at 50%.  Could not tolerate Imdur at 90 mg, therefore decreased to 60 mg daily.   Today he presents for scheduled follow-up.  He states he is doing well.  Chief concern is bilateral hip pain, recently was moving motor out of car, and has been having pain since.  Notes improvement in chest pain, says he does have some pains occasionally, but no recent episodes  of chest pain and no active chest pain. Denies any shortness of breath, palpitations, syncope, presyncope, dizziness, orthopnea, PND, swelling or significant weight changes, acute bleeding, or claudication.   EKGs/Labs/Other Studies Reviewed:   The following studies were reviewed today:   EKG:  EKG is not ordered today.   Echo 06/2023:   1. Left ventricular ejection fraction, by estimation, is 50%. The left  ventricle has low normal function. The left ventricle demonstrates  regional wall motion abnormalities (see scoring diagram/findings for  description). Left ventricular diastolic  parameters are consistent with Grade II diastolic dysfunction  (pseudonormalization).   2. Right ventricular systolic function is mildly reduced. The right  ventricular size is normal.   3. The mitral valve is grossly normal. No evidence of mitral valve  regurgitation. No evidence of mitral stenosis.   4. The aortic valve has an indeterminant number of cusps. Aortic valve  regurgitation is not visualized. No aortic stenosis is present.   Conclusion(s)/Recommendation(s): No left ventricular mural or apical  thrombus/thrombi.  Myoview 05/2023:   Findings are consistent with large extensive apical infarction. Intermediate to high risk study based on large area of scar and decreased LVEF. There is no current myocardium at jeopardy.  Consider correlating LVEF with echo.   No ST deviation was noted.   LV perfusion is abnormal. Large severe intensity fixed extensive apical defect consistent with large area of scar. There is no current ischemia.   Left ventricular function is abnormal. Nuclear stress EF: 35 %. The left ventricular ejection fraction is moderately decreased (30-44%).  End diastolic cavity size is severely enlarged.  Review of Systems    All other systems reviewed and are otherwise negative except as noted above.  Physical Exam    VS:  BP 114/78   Pulse 69   Ht 5\' 7"  (1.702 m)   Wt 167 lb 3.2  oz (75.8 kg)   SpO2 95%   BMI 26.19 kg/m  , BMI Body mass index is 26.19 kg/m.  Wt Readings from Last 3 Encounters:  07/23/23 167 lb 3.2 oz (75.8 kg)  07/08/23 163 lb 1.9 oz (74 kg)  07/01/23 163 lb (73.9 kg)     GEN: Well nourished, well developed, in no acute distress. HEENT: normal. Neck: Supple, no JVD, carotid bruits, or masses. Cardiac: S1/S2, RRR, no murmurs, rubs, or gallops. No clubbing, cyanosis, edema.  Radials/PT 2+ and equal bilaterally.  Respiratory:  Respirations regular and unlabored, clear to auscultation bilaterally GI: Soft, nontender, nondistended. MS: No deformity or atrophy. Skin: Warm and dry, no rash. Neuro:  Strength and sensation are intact. Psych: Normal affect.  Assessment & Plan    Multivessel CAD, s/p CABG in 2006 Admits to episodes chest pain, improvement since hospital d/c, denies any recent/active chest pain.  Recent Myoview was negative for any current ischemia.  Continue aspirin, carvedilol, Imdur, rosuvastatin, Ranexa, and nitroglycerin as needed. Lisinopril is currently on hold at this time d/t kidney function - see below. Smoking cessation encouraged and discussed.  Heart healthy diet encouraged.  ED precautions discussed.  HFimpEF Stage C, NYHA class I symptoms. TTE 06/2023 revealed EF 50%.  Euvolemic and well compensated on exam.  Continue carvedilol, farxiga, and Imdur as mentioned above. Lisinopril and Lasix are currently on hold at this time d/t his kidney function - see above. Low sodium diet, fluid restriction <2L, and daily weights encouraged. Educated to contact our office for weight gain of 2 lbs overnight or 5 lbs in one week.    Mixed hyperlipidemia  Continue rosuvastatin since he is tolerating this well, wife says dosage was recently increased. Heart healthy diet and regular cardiovascular exercise encouraged. Plan to obtain FLP and LFT at next OV.   4.  History of LV mural thrombus Resolved, no longer on Coumadin. Recent TTE showed  no LV mural or apical thrombus.   5. Pulsatile tinnitus Does admit to pulsatile tinnitus of left ear. Will obtain carotid doppler to r/o any carotid artery disease as he has several risk factors for carotid artery disease. Heart healthy diet and regular cardiovascular exercise encouraged.   6. COPD Denies any recent acute exacerbations or worsening symptoms.  No medication changes at this time.  Continue to follow-up with PCP.  7. Tobacco abuse  Smoking cessation encouraged and discussed.  8.  AKI Most recent sCr 1.65 with eGFR 48.  Avoid nephrotoxic agents.  Encourage adequate hydration.  Will obtain BMET in 1 week.  No medication changes at this time.  Continue follow-up with PCP.  7. History of CVA Denies any concerning/red flag symptoms.  Continue current medication regimen. Continue to follow with PCP. Heart healthy diet and regular cardiovascular exercise encouraged.   9. Bilateral hip pain D/t recent lifting of heavy object. Does have limp when he walks, would recommend imaging. Encouraged him to discuss/follow-up with PCP.   Disposition: Will refill medications per his request. Follow up in 2-3 months with [name], MD or APP.  Signed, Sharlene Dory, NP 07/23/2023, 9:28 AM [facility]

## 2023-07-25 ENCOUNTER — Ambulatory Visit: Payer: Medicaid Other | Attending: Nurse Practitioner

## 2023-07-25 DIAGNOSIS — H93A2 Pulsatile tinnitus, left ear: Secondary | ICD-10-CM

## 2023-08-07 ENCOUNTER — Telehealth: Payer: Self-pay | Admitting: *Deleted

## 2023-08-07 NOTE — Telephone Encounter (Signed)
Patient informed and verbalized understanding of plan. Copy sent to PCP 

## 2023-08-07 NOTE — Telephone Encounter (Signed)
-----   Message from Nurse Isabelle Course A sent at 08/07/2023  4:13 PM EDT -----  ----- Message ----- From: Sharlene Dory, NP Sent: 08/05/2023   3:52 PM EDT To: Carmelina Paddock, CMA  Kidney function has returned to normal. Will discontinue lisinopril off med list as this caused AKI and d/t his most recent BP. Please instruct him to monitor and log his BP and I will review this with him at next OV.   Thanks!    Sharlene Dory, AGNP-C

## 2023-08-14 ENCOUNTER — Encounter: Payer: Self-pay | Admitting: Gastroenterology

## 2023-08-14 NOTE — Progress Notes (Unsigned)
Referring Provider: Gardenia Phlegm, MD  Primary Care Physician:  Billie Lade, MD Primary Gastroenterologist:  Dr. Tasia Catchings  Chief Complaint  Patient presents with   Colonoscopy    Positive cologuard    HPI:   Steve Vang is a 57 y.o. male presenting today at the request of Gardenia Phlegm, MD for positive Cologuard.  Cologuard was positive on 07/06/2023. Recent CBC June 2024 within normal limits. Not sure about colon cancer in the family.   Reports he doing well overall.  No GI concerns.  Denies abdominal pain, constipation.  Occasionally, he will have diarrhea, but nothing routine.  No BRBPR, melena, unintentional weight loss, nausea, vomiting, reflux symptoms, or dysphagia.  He does have chronic, intermittent chest pain.  History of CABG.  He follows with cardiology routinely with last office visit in July.  Symptoms have been stable, and he has been advised to continue his current medications.     Past Medical History:  Diagnosis Date   CAD (coronary artery disease)    Multivessel, LVEF 45-50%   Cardiomyopathy (HCC)    LVEF 50-55% December 2016   CKD (chronic kidney disease) stage 2, GFR 60-89 ml/min    Diastolic heart failure (HCC)    Essential hypertension    History of stroke 2004   Hyperlipidemia    Left ventricular mural thrombus    MDD in remission    Not   T2DM (type 2 diabetes mellitus) (HCC) 06/24/2023   Diagnoised with Hgb a1c of 7.5   Tobacco use disorder     Past Surgical History:  Procedure Laterality Date   CORONARY ARTERY BYPASS GRAFT     DOR anterior ventricular restoration surgery 8/06, [facility] to first diagonal, SVG to PLB, SVG to RVE branch of nondominant RCA   EYE SURGERY     TOTAL HIP ARTHROPLASTY Left 06/08/2016   Procedure: TOTAL HIP ARTHROPLASTY ANTERIOR APPROACH;  Surgeon: Kathryne Hitch, MD;  Location: [facility];  Service: Orthopedics;  Laterality: Left;   TOTAL HIP ARTHROPLASTY Right 07/20/2016   Procedure: RIGHT  TOTAL HIP ARTHROPLASTY ANTERIOR APPROACH;  Surgeon: Kathryne Hitch, MD;  Location: [facility];  Service: Orthopedics;  Laterality: Right;    Current Outpatient Medications  Medication Sig Dispense Refill   aspirin EC 81 MG tablet Take 81 mg by mouth daily.     Blood Glucose Monitoring Suppl DEVI 1 each by Does not apply route in the morning, at noon, and at bedtime. May substitute to any manufacturer covered by patient's insurance. 1 each 0   carvedilol (COREG) 6.25 MG tablet Take 1 tablet (6.25 mg total) by mouth 2 (two) times daily. 180 tablet 1   dapagliflozin propanediol (FARXIGA) 10 MG TABS tablet Take 1 tablet (10 mg total) by mouth daily. 60 tablet 5   isosorbide mononitrate (IMDUR) 60 MG 24 hr tablet Take 1 tablet (60 mg total) by mouth daily. 60 tablet 4   nicotine (NICODERM CQ - DOSED IN MG/24 HOURS) 21 mg/24hr patch Place 1 patch (21 mg total) onto the skin daily. 28 patch 1   nitroGLYCERIN (NITROSTAT) 0.4 MG SL tablet Place 1 tablet (0.4 mg total) under the tongue every 5 (five) minutes x 3 doses as needed for chest pain (If no relief after 3rd dose, proceed to ED or call 911). 25 tablet 1   ranolazine (RANEXA) 500 MG 12 hr tablet Take 1 tablet (500 mg total) by mouth 2 (two) times daily. 60 tablet 4  rosuvastatin (CRESTOR) 40 MG tablet Take 1 tablet (40 mg total) by mouth daily. 90 tablet 3   No current facility-administered medications for this visit.    Allergies as of 08/15/2023 - Review Complete 08/15/2023  Allergen Reaction Noted   Benadryl [diphenhydramine] Hives and Swelling 07/18/2014   Chantix [varenicline] Nausea And Vomiting 05/25/2016   Latex Swelling and Rash 07/01/2023    Family History  Problem Relation Age of Onset   Hypertension Mother    Diabetes Mother     Social History   Socioeconomic History   Marital status: Married    Spouse name: Not on file   Number of children: Not on file   Years of education: Not on file   Highest education level:  Not on file  Occupational History   Not on file  Tobacco Use   Smoking status: Some Days    Types: Cigars   Smokeless tobacco: Never   Tobacco comments:    Vaping as well  Vaping Use   Vaping status: Never Used  Substance and Sexual Activity   Alcohol use: No    Alcohol/week: 0.0 standard drinks of alcohol    Comment: hx of alcohol use    Drug use: Yes    Types: Marijuana    Comment:  marijuana use   Sexual activity: Yes    Partners: Female  Other Topics Concern   Not on file  Social History Narrative   Not on file   Social Determinants of Health   Financial Resource Strain: Not on file  Food Insecurity: No Food Insecurity (06/24/2023)   Hunger Vital Sign    Worried About Running Out of Food in the Last Year: Never true    Ran Out of Food in the Last Year: Never true  Transportation Needs: No Transportation Needs (06/24/2023)   PRAPARE - Administrator, Civil Service (Medical): No    Lack of Transportation (Non-Medical): No  Physical Activity: Not on file  Stress: Not on file  Social Connections: Unknown (05/20/2023)   [facility]   Social Network    Social Network: Not on file  Intimate Partner Violence: Not At Risk (06/24/2023)   Humiliation, Afraid, Rape, and Kick questionnaire    Fear of Current or Ex-Partner: No    Emotionally Abused: No    Physically Abused: No    Sexually Abused: No    Review of Systems: Gen: Denies any fever, chills, cold or flulike symptoms, presyncope, syncope. CV: Admits intermittent chest pain.  No palpitations. Resp: Denies shortness of breath, cough. GI: See HPI GU : Denies urinary burning, urinary frequency, urinary hesitancy MS: Denies joint pain. Derm: Denies rash. Psych: Denies depression, anxiety. Heme: See HPI  Physical Exam: BP 126/80 (BP Location: Left Arm, Patient Position: Sitting, Cuff Size: Normal)   Pulse (!) 59   Temp (!) 97.4 F (36.3 C) (Oral)   Ht 5\' 7"  (1.702 m)   Wt 165 lb (74.8  kg)   SpO2 96%   BMI 25.84 kg/m  General:   Alert and oriented. Pleasant and cooperative. Well-nourished and well-developed.  Head:  Normocephalic and atraumatic. Eyes:  Without icterus, sclera clear and conjunctiva pink.  Ears:  Normal auditory acuity. Lungs:  Clear to auscultation bilaterally. No wheezes, rales, or rhonchi. No distress.  Heart:  S1, S2 present without murmurs appreciated.  Abdomen:  +BS, soft, non-tender and non-distended. No HSM noted. No guarding or rebound. No masses appreciated.  Rectal:  Deferred  Msk:  Symmetrical without gross deformities. Normal posture. Extremities:  Without edema. Neurologic:  Alert and  oriented x4;  grossly normal neurologically. Skin:  Intact without significant lesions or rashes. Psych:  Normal mood and affect.    Assessment:  58 year old male with history of CAD s/p CABG, cardiomyopathy, CKD, HTN, CVA, HLD, presenting today to discuss scheduling colonoscopy following recent positive Cologuard in July 2024.  He has no significant GI symptoms.  No alarm symptoms.  No known family history of colon cancer.   Plan:  Proceed with colonoscopy with propofol by Dr. Tasia Catchings in near future. The risks, benefits, and alternatives have been discussed with the patient in detail. The patient states understanding and desires to proceed.  ASA 3 Hold Farxiga x 3 days prior to procedure. Follow-up as needed.   Ermalinda Memos, PA-C [facility] 08/15/2023

## 2023-08-15 ENCOUNTER — Telehealth: Payer: Self-pay | Admitting: *Deleted

## 2023-08-15 ENCOUNTER — Ambulatory Visit: Payer: Medicaid Other | Admitting: Gastroenterology

## 2023-08-15 ENCOUNTER — Encounter: Payer: Self-pay | Admitting: Gastroenterology

## 2023-08-15 VITALS — BP 126/80 | HR 59 | Temp 97.4°F | Ht 67.0 in | Wt 165.0 lb

## 2023-08-15 DIAGNOSIS — R195 Other fecal abnormalities: Secondary | ICD-10-CM | POA: Diagnosis not present

## 2023-08-15 NOTE — Patient Instructions (Addendum)
We will arrange for you to have a colonoscopy in the near future with Dr. Baldemar Lenis [facility]. You will need to hold Farxiga for 3 days prior to your procedure.  It was nice to see you today.    Ermalinda Memos, PA-C [facility]

## 2023-08-15 NOTE — Telephone Encounter (Signed)
Called pt, VM full and not able to leave VM  Need to schedule TCS with Dr. Tasia Catchings, ASA 3, hold farxiga x 3 days prior

## 2023-08-20 ENCOUNTER — Encounter: Payer: Medicaid Other | Attending: Internal Medicine | Admitting: Nutrition

## 2023-08-20 ENCOUNTER — Encounter: Payer: Self-pay | Admitting: Nutrition

## 2023-08-20 VITALS — Ht 67.0 in | Wt 161.0 lb

## 2023-08-20 DIAGNOSIS — I5032 Chronic diastolic (congestive) heart failure: Secondary | ICD-10-CM | POA: Diagnosis present

## 2023-08-20 DIAGNOSIS — I25119 Atherosclerotic heart disease of native coronary artery with unspecified angina pectoris: Secondary | ICD-10-CM

## 2023-08-20 DIAGNOSIS — E782 Mixed hyperlipidemia: Secondary | ICD-10-CM | POA: Diagnosis present

## 2023-08-20 DIAGNOSIS — E118 Type 2 diabetes mellitus with unspecified complications: Secondary | ICD-10-CM | POA: Diagnosis present

## 2023-08-20 NOTE — Patient Instructions (Signed)
Goals  Eat breakfast daily ever morning Walk 30 minutes every day. Increase water intake to 5 bottles per day\ Test blood sugars twice a day Focus on eating more whole plant based foods for all meals or snacks. Cut down on beef or pork once a week.

## 2023-08-20 NOTE — Progress Notes (Signed)
Medical Nutrition Therapy  Appointment Start time:  [phone]  Appointment End time:  1015  Primary concerns today: Type 2 DM, CVD  Referral diagnosis: E11.0,  Preferred learning style: No Preference  Learning readiness: Ready    NUTRITION ASSESSMENT  57 yr old wmale referred for Type 2 DM. Here with his wife A1C 7.8%. He has since cut out eating ice cream, potatoes and processed snacks. Here with his wife. PMH: CAD, Cardiomyopathy, CHF, Type 2 DM, Hyperlipidemia, CVA, Depression. He notes he has had 10 heart attacks.  He is checking BS every morning. Usually running in the 120-130's. Currently taking [place] daily.  Not working. Swims and walks for exercise.  He is willing to focus on Lifestyle Medicine with a whole plant based diet to help reverse his DM and improve his overall health. His wife is supportive.   Anthropometrics  Wt Readings from Last 3 Encounters:  08/15/23 165 lb (74.8 kg)  07/23/23 167 lb 3.2 oz (75.8 kg)  07/08/23 163 lb 1.9 oz (74 kg)   Ht Readings from Last 3 Encounters:  08/15/23 5\' 7"  (1.702 m)  07/23/23 5\' 7"  (1.702 m)  07/08/23 5\' 7"  (1.702 m)   There is no height or weight on file to calculate BMI. @BMIFA @ Facility age limit for growth %iles is 20 years. Facility age limit for growth %iles is 20 years.    Clinical Medical Hx: See chart Medications: Farxiga Labs:  Lab Results  Component Value Date   HGBA1C 7.4 (H) 06/24/2023      Latest Ref Rng & Units 07/31/2023   11:43 AM 07/08/2023    9:24 AM 07/01/2023    1:57 PM  CMP  Glucose 70 - 99 mg/dL 960  454  92   BUN 6 - 24 mg/dL 18  35  52   Creatinine 0.76 - 1.27 mg/dL 0.98  1.19  1.47   Sodium 134 - 144 mmol/L 139  135  137   Potassium 3.5 - 5.2 mmol/L 4.5  5.0  5.4   Chloride 96 - 106 mmol/L 103  104  102   CO2 20 - 29 mmol/L 20  19  21    Calcium 8.7 - 10.2 mg/dL 9.7  9.9  82.9    Lipid Panel     Component Value Date/Time   CHOL 161 06/25/2023 0418   CHOL 221 (H) 05/22/2023 0806    TRIG 149 06/25/2023 0418   HDL 38 (L) 06/25/2023 0418   HDL 39 (L) 05/22/2023 0806   CHOLHDL 4.2 06/25/2023 0418   VLDL 30 06/25/2023 0418   LDLCALC 93 06/25/2023 0418   LDLCALC 151 (H) 05/22/2023 0806   LDLCALC 131 (H) 06/05/2019 0803   LABVLDL 31 05/22/2023 0806    Notable Signs/Symptoms: None  Lifestyle & Dietary Hx Lives with wife. Not working due to health issues. Has dentures but doesn't wear them.  Estimated daily fluid intake: 40 oz Supplements:  Sleep: 7 hrs Stress / self-care: no problems Current average weekly physical activity: swims and walks   24-Hr Dietary Recall Eats 2 meals per day. Has cut out snacks and ice cream Drinks water.  Estimated Energy Needs Calories: 2000 Carbohydrate: 225g Protein: 150g Fat: 56g   NUTRITION DIAGNOSIS  NB-1.1 Food and nutrition-related knowledge deficit As related to Diabetes Type 2.  As evidenced by A1C 7.6%.   NUTRITION INTERVENTION  Nutrition education (E-1) on the following topics:  Nutrition and Diabetes education provided on My Plate, CHO counting, meal planning, portion sizes, timing  of meals, avoiding snacks between meals unless having a low blood sugar, target ranges for A1C and blood sugars, signs/symptoms and treatment of hyper/hypoglycemia, monitoring blood sugars, taking medications as prescribed, benefits of exercising 30 minutes per day and prevention of complications of DM.  Lifestyle Medicine  - Whole Food, Plant Predominant Nutrition is highly recommended: Eat Plenty of vegetables, Mushrooms, fruits, Legumes, Whole Grains, Nuts, seeds in lieu of processed meats, processed snacks/pastries red meat, poultry, eggs.    -It is better to avoid simple carbohydrates including: Cakes, Sweet Desserts, Ice Cream, Soda (diet and regular), Sweet Tea, Candies, Chips, Cookies, Store Bought Juices, Alcohol in Excess of  1-2 drinks a day, Lemonade,  Artificial Sweeteners, Doughnuts, Coffee Creamers, "Sugar-free" Products,  etc, etc.  This is not a complete list.....  Exercise: If you are able: 30 -60 minutes a day ,4 days a week, or 150 minutes a week.  The longer the better.  Combine stretch, strength, and aerobic activities.  If you were told in the past that you have high risk for cardiovascular diseases, you may seek evaluation by your heart doctor prior to initiating moderate to intense exercise programs.   Handouts Provided Include  Lifestyle Medicine handouts Know your numbers  Learning Style & Readiness for Change Teaching method utilized: Visual & Auditory  Demonstrated degree of understanding via: Teach Back  Barriers to learning/adherence to lifestyle change: NOne  Goals Established by Pt Goals  Eat breakfast daily ever morning Walk 30 minutes every day. Increase water intake to 5 bottles per day\ Test blood sugars twice a day Focus on eating more whole plant based foods for all meals or snacks. Cut down on beef or pork once a week.   MONITORING & EVALUATION Dietary intake, weekly physical activity, and blood sugars in 2 months.  Next Steps  Patient is to work on eating more whole plant based and cut out animal products.Marland Kitchen

## 2023-08-22 NOTE — Telephone Encounter (Signed)
Called pt, no answer not able to leave VM

## 2023-08-27 ENCOUNTER — Other Ambulatory Visit: Payer: Self-pay

## 2023-08-27 MED ORDER — BLOOD GLUCOSE TEST VI STRP: 1.0000 | ORAL_STRIP | Freq: Three times a day (TID) | 0 refills | Status: AC

## 2023-08-28 ENCOUNTER — Encounter: Payer: Self-pay | Admitting: *Deleted

## 2023-09-16 MED ORDER — CLENPIQ 10-3.5-12 MG-GM -GM/175ML PO SOLN: 1.0000 | ORAL | 0 refills | Status: DC

## 2023-09-16 NOTE — Telephone Encounter (Signed)
Pt spouse called back. He has been scheduled for 10/3. Aware will send instructions via mychart as she states they do have access to this. Will call back with pre-op appt. Rx for prep to be mailed.

## 2023-09-16 NOTE — Addendum Note (Signed)
Addended by: Armstead Peaks on: 09/16/2023 02:40 PM   Modules accepted: Orders

## 2023-09-17 ENCOUNTER — Encounter: Payer: Self-pay | Admitting: *Deleted

## 2023-09-24 ENCOUNTER — Ambulatory Visit: Payer: Medicaid Other | Attending: Nurse Practitioner | Admitting: Nurse Practitioner

## 2023-09-24 ENCOUNTER — Encounter: Payer: Self-pay | Admitting: Nurse Practitioner

## 2023-09-24 VITALS — BP 93/62 | HR 69 | Ht 67.0 in | Wt 159.6 lb

## 2023-09-24 DIAGNOSIS — E782 Mixed hyperlipidemia: Secondary | ICD-10-CM | POA: Diagnosis not present

## 2023-09-24 DIAGNOSIS — Z8673 Personal history of transient ischemic attack (TIA), and cerebral infarction without residual deficits: Secondary | ICD-10-CM

## 2023-09-24 DIAGNOSIS — I6523 Occlusion and stenosis of bilateral carotid arteries: Secondary | ICD-10-CM

## 2023-09-24 DIAGNOSIS — I513 Intracardiac thrombosis, not elsewhere classified: Secondary | ICD-10-CM

## 2023-09-24 DIAGNOSIS — I5032 Chronic diastolic (congestive) heart failure: Secondary | ICD-10-CM | POA: Diagnosis not present

## 2023-09-24 DIAGNOSIS — J449 Chronic obstructive pulmonary disease, unspecified: Secondary | ICD-10-CM

## 2023-09-24 DIAGNOSIS — Z01810 Encounter for preprocedural cardiovascular examination: Secondary | ICD-10-CM

## 2023-09-24 DIAGNOSIS — I2510 Atherosclerotic heart disease of native coronary artery without angina pectoris: Secondary | ICD-10-CM

## 2023-09-24 DIAGNOSIS — Z79899 Other long term (current) drug therapy: Secondary | ICD-10-CM

## 2023-09-24 DIAGNOSIS — R42 Dizziness and giddiness: Secondary | ICD-10-CM

## 2023-09-24 DIAGNOSIS — Z720 Tobacco use: Secondary | ICD-10-CM

## 2023-09-24 MED ORDER — CARVEDILOL 6.25 MG PO TABS: 3.1250 mg | ORAL_TABLET | Freq: Two times a day (BID) | ORAL | Status: DC

## 2023-09-24 NOTE — Progress Notes (Signed)
Called and s/w wife (okay per DPR) at 12:20 regarding high risk RCRI and she verbalized understanding.   Sharlene Dory, NP

## 2023-09-24 NOTE — Patient Instructions (Addendum)
Medication Instructions:  Your physician has recommended you make the following change in your medication:  Reduce carvedilol to 3.125 Twice daily  Continue all other medications as prescribed   Labwork: In 1-2 weeks CBC, CMET, FLP at [facility]   Testing/Procedures: None  Follow-Up: Your physician recommends that you schedule a follow-up appointment in: 2 months  Any Other Special Instructions Will Be Listed Below (If Applicable).  If you need a refill on your cardiac medications before your next appointment, please call your pharmacy.

## 2023-09-24 NOTE — Progress Notes (Signed)
Office Visit    Patient Name: Steve Vang Date of Encounter: 09/24/2023 PCP:  Billie Lade, MD [facility]  Cardiologist:  [name], MD  Advanced Practice Provider:  No care team member to display Electrophysiologist:  None  0746} Chief Complaint and HPI    Steve Vang is a 57 y.o. male with a hx of multivessel CAD, s/p CABG in 2006, HFimpEF, past history of LV mural thrombus, hypertension, mixed hyperlipidemia, T2DM, prior history of CVA, COPD, positive cologuard, and CKD, who presents today for 6-8 week follow-up.  Last seen by Dr. Diona Browner on May 17, 2023.  He reported exertional chest tightness, left arm discomfort consistent with angina.  Symptoms resolved with rest, denied any nitroglycerin use.  Imdur was increased to 60 mg daily.  Myoview was arranged and revealed evidence of prior MI, study was deemed intermediate to high risk based on large area of scar along apical area with decreased LVEF, no current ischemia noted during testing.   Hospital admission for evaluation of stable angina, noted to have acute on chronic diastolic CHF exacerbation, required IV Lasix.  Cardiology saw patient and adjusted medications.  Newly diagnosed with type 2 diabetes.  Echocardiogram revealed improved EF at 50%.  Could not tolerate Imdur at 90 mg, therefore decreased to 60 mg daily.   Last seen for office visit on July 23, 2023. Was doing well, chief concern was bilateral hip pain. Denied any recent/active chest pain.   Today he presents for follow-up. Hasn't been feeling well for the past week.  Denies any fever, has been taking NyQuil.  Admits to coughing and congestion, does not believe this is COVID.  Wife says he has been refusing to eat and has been encouraging him to stay well-hydrated.  Has not seen his PCP regarding this. Denies any chest pain, shortness of breath, palpitations, syncope, presyncope, orthopnea, PND, swelling or significant weight changes,  acute bleeding, or claudication.  Has not been checking his BP recently, but does own a digital cuff. Says he does notice some intermittent dizziness at times, this has been chronic ever since his stroke, stable over time.  He is pending a colonoscopy on October 03, 2023, wife states he had a previous Cologuard test that came back positive.   EKGs/Labs/Other Studies Reviewed:   The following studies were reviewed today:   EKG:  EKG is not ordered today.   Carotid duplex 07/2023: Summary:  Right Carotid: Velocities in the right ICA are consistent with a 1-39%  stenosis.   Left Carotid: Velocities in the left ICA are consistent with a 1-39%  stenosis.   Vertebrals: Bilateral vertebral arteries demonstrate antegrade flow.  Subclavians: Normal flow hemodynamics were seen in bilateral subclavian arteries.  Echo 06/2023:   1. Left ventricular ejection fraction, by estimation, is 50%. The left  ventricle has low normal function. The left ventricle demonstrates  regional wall motion abnormalities (see scoring diagram/findings for  description). Left ventricular diastolic  parameters are consistent with Grade II diastolic dysfunction  (pseudonormalization).   2. Right ventricular systolic function is mildly reduced. The right  ventricular size is normal.   3. The mitral valve is grossly normal. No evidence of mitral valve  regurgitation. No evidence of mitral stenosis.   4. The aortic valve has an indeterminant number of cusps. Aortic valve  regurgitation is not visualized. No aortic stenosis is present.   Conclusion(s)/Recommendation(s): No left ventricular mural or apical  thrombus/thrombi.  Myoview 05/2023:  Findings are consistent with large extensive apical infarction. Intermediate to high risk study based on large area of scar and decreased LVEF. There is no current myocardium at jeopardy.  Consider correlating LVEF with echo.   No ST deviation was noted.   LV perfusion is abnormal.  Large severe intensity fixed extensive apical defect consistent with large area of scar. There is no current ischemia.   Left ventricular function is abnormal. Nuclear stress EF: 35 %. The left ventricular ejection fraction is moderately decreased (30-44%). End diastolic cavity size is severely enlarged.  Review of Systems    All other systems reviewed and are otherwise negative except as noted above.  Physical Exam    VS:  BP 92/64   Pulse 69   Ht 5\' 7"  (1.702 m)   Wt 159 lb 9.6 oz (72.4 kg)   SpO2 94%   BMI 25.00 kg/m  , BMI Body mass index is 25 kg/m.  Wt Readings from Last 3 Encounters:  09/24/23 159 lb 9.6 oz (72.4 kg)  08/20/23 161 lb (73 kg)  08/15/23 165 lb (74.8 kg)    Repeat manual BP on exam: 93/62  GEN: Well nourished, well developed, in no acute distress. HEENT: normal. Neck: Supple, no JVD, carotid bruits, or masses. Cardiac: S1/S2, RRR, no murmurs, rubs, or gallops. No clubbing, cyanosis, edema.  Radials/PT 2+ and equal bilaterally.  Respiratory:  Respirations regular and unlabored, clear and diminished to auscultation bilaterally MS: No deformity or atrophy. Skin: Warm and dry, no rash. Neuro:  Strength and sensation are intact. Psych: Normal affect.  Assessment & Plan    Multivessel CAD, s/p CABG in 2006, medication management Stable with no anginal symptoms. No indication for ischemic evaluation.  Myoview 05/2023 was negative for any current ischemia.  Continue aspirin, Imdur, rosuvastatin, Ranexa, and nitroglycerin as needed.  Will reduce carvedilol to 3.125 mg twice daily due to patient's current BP.  Lisinopril has currently on hold at this time d/t BP and past kidney function - will repeat CMET. Smoking cessation encouraged and discussed.  Heart healthy diet encouraged.  ED precautions discussed.  HFimpEF Stage C, NYHA class I symptoms. TTE 06/2023 revealed EF 50%.  Euvolemic and well compensated on exam.  Continue farxiga, and Imdur as mentioned above.  Lisinopril and Lasix have been on hold  d/t his past kidney function - see above. Hesitant to restart with current BP. Repeating labs as mentioned above. Low sodium diet, fluid restriction <2L, and daily weights encouraged. Educated to contact our office for weight gain of 2 lbs overnight or 5 lbs in one week.    Mixed hyperlipidemia  Continue rosuvastatin since he is tolerating this well. Heart healthy diet and regular cardiovascular exercise encouraged. Will obtain FLP and CMET.   4.  History of LV mural thrombus Resolved, no longer on Coumadin. TTE 06/2023 showed no LV mural or apical thrombus.   5. Carotid artery disease Mild bilateral carotid artery disease noted on carotid duplex July 2024, 1 to 39% ICA stenosis bilaterally.  Continue current medication regimen.  Heart healthy diet and regular cardiovascular exercise encouraged.   6. COPD Denies any recent acute exacerbations or worsening symptoms.  No medication changes at this time.  Continue to follow-up with PCP.  Does admit to recent cough and some URI symptoms.  Recommended to consult PCP regarding this.  7. Tobacco abuse  Smoking cessation encouraged and discussed.  8. History of CVA, dizziness/lightheadedness Denies any concerning/red flag symptoms.  Chronic, stable dizziness/lightheadedness ever  since stroke.  Care precautions discussed.  Continue current medication regimen. Continue to follow with PCP. Heart healthy diet and regular cardiovascular exercise encouraged.   9.  Preoperative cardiovascular risk assessment0746} Mr. Klinke's perioperative risk of a major cardiac event is 11% according to the Revised Cardiac Risk Index (RCRI).  Therefore, he is at high risk for perioperative complications.   His functional capacity is good at 5.07 METs according to the Duke Activity Status Index (DASI).  Recommendations: According to ACC/AHA guidelines, no further cardiovascular testing needed.  The patient may proceed to surgery at  acceptable risk.   Antiplatelet and/or Anticoagulation Recommendations: The patient should remain on Aspirin without interruption.   This note will be routed to the requesting party.   Disposition: Follow up in 2 months with [name], MD or APP.  Signed, Sharlene Dory, NP 09/24/2023, 9:01 AM  [facility]

## 2023-09-30 NOTE — Patient Instructions (Signed)
Steve Vang  09/30/2023     @PREFPERIOPPHARMACY @   Your procedure is scheduled on 10/03/23.  Report to [facility] at 0600 A.M.  Call this number if you have problems the morning of surgery:  [phone]  If you experience any cold or flu symptoms such as cough, fever, chills, shortness of breath, etc. between now and your scheduled surgery, please notify us at the above number.   Remember:  Do not eat or drink after midnight.     Take these medicines the morning of surgery with A SIP OF WATER coreg, imdur, ranexa.  Do not take any diabetic medications morning of procedure. LAST DOSE OF FARIXGA TO BE 09/29/23.  FOLLOW DIET & PREP INSTRUCTIONS AS PROVIDED TO YOU FROM DOCTOR'S OFFICE.    Do not wear jewelry, make-up or nail polish, including gel polish,  artificial nails, or any other type of covering on natural nails (fingers and  toes).  Do not wear lotions, powders, or perfumes, or deodorant.  Do not shave 48 hours prior to surgery.  Men may shave face and neck.  Do not bring valuables to the hospital.  [facility] is not responsible for any belongings or valuables.  Contacts, dentures or bridgework may not be worn into surgery.  Leave your suitcase in the car.  After surgery it may be brought to your room.  For patients admitted to the hospital, discharge time will be determined by your treatment team.  Patients discharged the day of surgery will not be allowed to drive home.   Name and phone number of your driver:   FAMILY Special instructions:  FOLLOW DIET & PREP INSTRUCTIONS FROM OFFICE  NO SMOKING / VAPING FOR 24 HOURS PRIOR TO PROCEDURE.  Please read over the following fact sheets that you were given. Anesthesia Post-op Instructions and Care and Recovery After Surgery      PATIENT INSTRUCTIONS POST-ANESTHESIA  IMMEDIATELY FOLLOWING SURGERY:  Do not drive or operate machinery for the first twenty four hours after surgery.  Do not make any important decisions  for twenty four hours after surgery or while taking narcotic pain medications or sedatives.  If you develop intractable nausea and vomiting or a severe headache please notify your doctor immediately.  FOLLOW-UP:  Please make an appointment with your surgeon as instructed. You do not need to follow up with anesthesia unless specifically instructed to do so.  WOUND CARE INSTRUCTIONS (if applicable):  Keep a dry clean dressing on the anesthesia/puncture wound site if there is drainage.  Once the wound has quit draining you may leave it open to air.  Generally you should leave the bandage intact for twenty four hours unless there is drainage.  If the epidural site drains for more than 36-48 hours please call the anesthesia department.  QUESTIONS?:  Please feel free to call your physician or the hospital operator if you have any questions, and they will be happy to assist you.      Colonoscopy, Adult A colonoscopy is a procedure to look at the entire large intestine. This procedure is done using a long, thin, flexible tube that has a camera on the end. You may have a colonoscopy: As a part of normal colorectal screening. If you have certain symptoms, such as: A low number of red blood cells in your blood (anemia). Diarrhea that does not go away. Pain in your abdomen. Blood in your stool. A colonoscopy can help screen for and diagnose medical problems, including: An abnormal  growth of cells or tissue (tumor). Abnormal growths within the lining of your intestine (polyps). Inflammation. Areas of bleeding. Tell your health care provider about: Any allergies you have. All medicines you are taking, including vitamins, herbs, eye drops, creams, and over-the-counter medicines. Any problems you or family members have had with anesthetic medicines. Any bleeding problems you have. Any surgeries you have had. Any medical conditions you have. Any problems you have had with having bowel  movements. Whether you are pregnant or may be pregnant. What are the risks? Generally, this is a safe procedure. However, problems may occur, including: Bleeding. Damage to your intestine. Allergic reactions to medicines given during the procedure. Infection. This is rare. What happens before the procedure? Eating and drinking restrictions Follow instructions from your health care provider about eating or drinking restrictions, which may include: A few days before the procedure: Follow a low-fiber diet. Avoid nuts, seeds, dried fruit, raw fruits, and vegetables. 1-3 days before the procedure: Eat only gelatin dessert or ice pops. Drink only clear liquids, such as water, clear juice, clear broth or bouillon, black coffee or tea, or clear soft drinks or sports drinks. Avoid liquids that contain red or purple dye. The day of the procedure: Do not eat solid foods. You may continue to drink clear liquids until up to 2 hours before the procedure. Do not eat or drink anything starting 2 hours before the procedure, or within the time period that your health care provider recommends. Bowel prep If you were prescribed a bowel prep to take by mouth (orally) to clean out your colon: Take it as told by your health care provider. Starting the day before your procedure, you will need to drink a large amount of liquid medicine. The liquid will cause you to have many bowel movements of loose stool until your stool becomes almost clear or light green. If your skin or the opening between the buttocks (anus) gets irritated from diarrhea, you may relieve the irritation using: Wipes with medicine in them, such as adult wet wipes with aloe and vitamin E. A product to soothe skin, such as petroleum jelly. If you vomit while drinking the bowel prep: Take a break for up to 60 minutes. Begin the bowel prep again. Call your health care provider if you keep vomiting or you cannot take the bowel prep without  vomiting. To clean out your colon, you may also be given: Laxative medicines. These help you have a bowel movement. Instructions for enema use. An enema is liquid medicine injected into your rectum. Medicines Ask your health care provider about: Changing or stopping your regular medicines or supplements. This is especially important if you are taking iron supplements, diabetes medicines, or blood thinners. Taking medicines such as aspirin and ibuprofen. These medicines can thin your blood. Do not take these medicines unless your health care provider tells you to take them. Taking over-the-counter medicines, vitamins, herbs, and supplements. General instructions Ask your health care provider what steps will be taken to help prevent infection. These may include washing skin with a germ-killing soap. If you will be going home right after the procedure, plan to have a responsible adult: Take you home from the hospital or clinic. You will not be allowed to drive. Care for you for the time you are told. What happens during the procedure?  An IV will be inserted into one of your veins. You will be given a medicine to make you fall asleep (general anesthetic). You will lie on  your side with your knees bent. A lubricant will be put on the tube. Then the tube will be: Inserted into your anus. Gently eased through all parts of your large intestine. Air will be sent into your colon to keep it open. This may cause some pressure or cramping. Images will be taken with the camera and will appear on a screen. A small tissue sample may be removed to be looked at under a microscope (biopsy). The tissue may be sent to a lab for testing if any signs of problems are found. If small polyps are found, they may be removed and checked for cancer cells. When the procedure is finished, the tube will be removed. The procedure may vary among health care providers and hospitals. What happens after the procedure? Your  blood pressure, heart rate, breathing rate, and blood oxygen level will be monitored until you leave the hospital or clinic. You may have a small amount of blood in your stool. You may pass gas and have mild cramping or bloating in your abdomen. This is caused by the air that was used to open your colon during the exam. If you were given a sedative during the procedure, it can affect you for several hours. Do not drive or operate machinery until your health care provider says that it is safe. It is up to you to get the results of your procedure. Ask your health care provider, or the department that is doing the procedure, when your results will be ready. Summary A colonoscopy is a procedure to look at the entire large intestine. Follow instructions from your health care provider about eating and drinking before the procedure. If you were prescribed an oral bowel prep to clean out your colon, take it as told by your health care provider. During the colonoscopy, a flexible tube with a camera on its end is inserted into the anus and then passed into all parts of the large intestine. This information is not intended to replace advice given to you by your health care provider. Make sure you discuss any questions you have with your health care provider. Document Revised: 01/29/2023 Document Reviewed: 08/09/2021 Elsevier Patient Education  2024 [employer] Colonoscopy, Adult, Care After The following information offers guidance on how to care for yourself after your procedure. Your health care provider may also give you more specific instructions. If you have problems or questions, contact your health care provider. What can I expect after the procedure? After the procedure, it is common to have: A small amount of blood in your stool for 24 hours after the procedure. Some gas. Mild cramping or bloating of your abdomen. Follow these instructions at home: Eating and drinking  Drink enough fluid to  keep your urine pale yellow. Follow instructions from your health care provider about eating or drinking restrictions. Resume your normal diet as told by your health care provider. Avoid heavy or fried foods that are hard to digest. Activity Rest as told by your health care provider. Avoid sitting for a long time without moving. Get up to take short walks every 1-2 hours. This is important to improve blood flow and breathing. Ask for help if you feel weak or unsteady. Return to your normal activities as told by your health care provider. Ask your health care provider what activities are safe for you. Managing cramping and bloating  Try walking around when you have cramps or feel bloated. If directed, apply heat to your abdomen as told by your  health care provider. Use the heat source that your health care provider recommends, such as a moist heat pack or a heating pad. Place a towel between your skin and the heat source. Leave the heat on for 20-30 minutes. Remove the heat if your skin turns bright red. This is especially important if you are unable to feel pain, heat, or cold. You have a greater risk of getting burned. General instructions If you were given a sedative during the procedure, it can affect you for several hours. Do not drive or operate machinery until your health care provider says that it is safe. For the first 24 hours after the procedure: Do not sign important documents. Do not drink alcohol. Do your regular daily activities at a slower pace than normal. Eat soft foods that are easy to digest. Take over-the-counter and prescription medicines only as told by your health care provider. Keep all follow-up visits. This is important. Contact a health care provider if: You have blood in your stool 2-3 days after the procedure. Get help right away if: You have more than a small spotting of blood in your stool. You have large blood clots in your stool. You have swelling of your  abdomen. You have nausea or vomiting. You have a fever. You have increasing pain in your abdomen that is not relieved with medicine. These symptoms may be an emergency. Get help right away. Call 911. Do not wait to see if the symptoms will go away. Do not drive yourself to the hospital. Summary After the procedure, it is common to have a small amount of blood in your stool. You may also have mild cramping and bloating of your abdomen. If you were given a sedative during the procedure, it can affect you for several hours. Do not drive or operate machinery until your health care provider says that it is safe. Get help right away if you have a lot of blood in your stool, nausea or vomiting, a fever, or increased pain in your abdomen. This information is not intended to replace advice given to you by your health care provider. Make sure you discuss any questions you have with your health care provider. Document Revised: 01/29/2023 Document Reviewed: 08/09/2021 Elsevier Patient Education  2024 [employer].

## 2023-10-01 ENCOUNTER — Encounter (HOSPITAL_COMMUNITY)
Admission: RE | Admit: 2023-10-01 | Discharge: 2023-10-01 | Disposition: A | Discharge status: Home / Self Care | Payer: Medicaid Other | Source: Ambulatory Visit | Attending: Gastroenterology | Admitting: Gastroenterology

## 2023-10-01 ENCOUNTER — Encounter (HOSPITAL_COMMUNITY): Payer: Self-pay

## 2023-10-01 ENCOUNTER — Encounter: Payer: Self-pay | Admitting: Internal Medicine

## 2023-10-01 ENCOUNTER — Ambulatory Visit: Payer: Medicaid Other | Admitting: Internal Medicine

## 2023-10-01 VITALS — BP 93/62 | HR 69 | Temp 98.4°F | Resp 18 | Ht 67.0 in | Wt 159.6 lb

## 2023-10-01 VITALS — BP 111/69 | HR 80 | Resp 16 | Ht 67.0 in | Wt 162.2 lb

## 2023-10-01 DIAGNOSIS — I255 Ischemic cardiomyopathy: Secondary | ICD-10-CM

## 2023-10-01 DIAGNOSIS — R195 Other fecal abnormalities: Secondary | ICD-10-CM | POA: Insufficient documentation

## 2023-10-01 DIAGNOSIS — M25551 Pain in right hip: Secondary | ICD-10-CM

## 2023-10-01 DIAGNOSIS — N182 Chronic kidney disease, stage 2 (mild): Secondary | ICD-10-CM

## 2023-10-01 DIAGNOSIS — E782 Mixed hyperlipidemia: Secondary | ICD-10-CM

## 2023-10-01 DIAGNOSIS — I10 Essential (primary) hypertension: Secondary | ICD-10-CM | POA: Diagnosis not present

## 2023-10-01 DIAGNOSIS — F17200 Nicotine dependence, unspecified, uncomplicated: Secondary | ICD-10-CM

## 2023-10-01 DIAGNOSIS — E1165 Type 2 diabetes mellitus with hyperglycemia: Secondary | ICD-10-CM

## 2023-10-01 DIAGNOSIS — I25119 Atherosclerotic heart disease of native coronary artery with unspecified angina pectoris: Secondary | ICD-10-CM

## 2023-10-01 LAB — BASIC METABOLIC PANEL WITH GFR
Anion gap: 10 (ref 5–15)
BUN: 23 mg/dL — ABNORMAL HIGH (ref 6–20)
CO2: 24 mmol/L (ref 22–32)
Calcium: 9.3 mg/dL (ref 8.9–10.3)
Chloride: 100 mmol/L (ref 98–111)
Creatinine, Ser: 1.2 mg/dL (ref 0.61–1.24)
GFR, Estimated: >60 mL/min
Glucose, Bld: 184 mg/dL — ABNORMAL HIGH (ref 70–99)
Potassium: 3.9 mmol/L (ref 3.5–5.1)
Sodium: 134 mmol/L — ABNORMAL LOW (ref 135–145)

## 2023-10-01 MED ORDER — CYCLOBENZAPRINE HCL 10 MG PO TABS
10.0000 mg | ORAL_TABLET | Freq: Three times a day (TID) | ORAL | 0 refills | Status: DC | PRN
Start: 2023-10-01 — End: 2023-11-20

## 2023-10-01 NOTE — Progress Notes (Signed)
Established Patient Office Visit  Subjective   Patient ID: Steve Vang, male    DOB: [birth year]  Age: 57 y.o. MRN: [id number]  Chief Complaint  Patient presents with   Hip Pain    Flared up x last Thursday    [name] returns to care today for routine follow-up.  Last evaluated at [facility] on 7/8 by Dr. Barbaraann Vang in the setting of AKI with hyperkalemia.  Lisinopril and Lasix were held.  In the interim, he has been evaluated by gastroenterology and cardiology.  Carvedilol was reduced to 3.25 mg twice daily due to hypotension.  Lisinopril and Lasix remain held, pending repeat labs.  There have otherwise been no acute interval events.  Today [name] endorses anterior right hip pain.  He has previously undergone right hip arthroplasty.  Walks with antalgic gait due to pain.  He is not taking anything for pain control currently.  He does not have any additional concerns to discuss.  Past Medical History:  Diagnosis Date   CAD (coronary artery disease)    Multivessel, LVEF 45-50%   Cardiomyopathy (HCC)    LVEF 50-55% December 2016   CKD (chronic kidney disease) stage 2, GFR 60-89 ml/min    COPD (chronic obstructive pulmonary disease) (HCC)    Diastolic heart failure (HCC)    Essential hypertension    History of stroke 2004   Hyperlipidemia    Left ventricular mural thrombus    MDD in remission    Not   Myocardial infarction [facility])    Stroke (HCC)    T2DM (type 2 diabetes mellitus) (HCC) 06/24/2023   Diagnoised with Hgb a1c of 7.5   Tobacco use disorder    Past Surgical History:  Procedure Laterality Date   CORONARY ARTERY BYPASS GRAFT     DOR anterior ventricular restoration surgery 8/06, [facility] - LIMA to first diagonal, SVG to PLB, SVG to RVE branch of nondominant RCA   EYE SURGERY     TOTAL HIP ARTHROPLASTY Left 06/08/2016   Procedure: TOTAL HIP ARTHROPLASTY ANTERIOR APPROACH;  Surgeon: Steve Hitch, MD;  Location: [facility];  Service: Orthopedics;  Laterality: Left;    TOTAL HIP ARTHROPLASTY Right 07/20/2016   Procedure: RIGHT TOTAL HIP ARTHROPLASTY ANTERIOR APPROACH;  Surgeon: Steve Hitch, MD;  Location: [facility];  Service: Orthopedics;  Laterality: Right;   Social History   Tobacco Use   Smoking status: Some Days    Types: Cigars   Smokeless tobacco: Never   Tobacco comments:    Vaping as well  Vaping Use   Vaping status: Never Used  Substance Use Topics   Alcohol use: No    Alcohol/week: 0.0 standard drinks of alcohol    Comment: hx of alcohol use    Drug use: Yes    Types: Marijuana    Comment:  marijuana use   Family History  Problem Relation Age of Onset   Hypertension Mother    Diabetes Mother    Allergies  Allergen Reactions   Benadryl [Diphenhydramine] Hives and Swelling   Chantix [Varenicline] Nausea And Vomiting   Latex Swelling and Rash   Review of Systems  Musculoskeletal:  Positive for joint pain (Anterior right hip pain).  All other systems reviewed and are negative.     Objective:     BP 111/69   Pulse 80   Resp 16   Ht 5\' 7"  (1.702 m)   Wt 162 lb 3.2 oz (73.6 kg)   SpO2 91%  BMI 25.40 kg/m  BP Readings from Last 3 Encounters:  10/03/23 (!) 105/56  10/01/23 93/62  10/01/23 111/69   Physical Exam Vitals reviewed.  Constitutional:      General: He is not in acute distress.    Appearance: Normal appearance. He is not ill-appearing.  HENT:     Head: Normocephalic and atraumatic.     Right Ear: External ear normal.     Left Ear: External ear normal.     Nose: Nose normal. No congestion or rhinorrhea.     Mouth/Throat:     Mouth: Mucous membranes are moist.     Pharynx: Oropharynx is clear.  Eyes:     General: No scleral icterus.    Extraocular Movements: Extraocular movements intact.     Conjunctiva/sclera: Conjunctivae normal.     Pupils: Pupils are equal, round, and reactive to light.  Cardiovascular:     Rate and Rhythm: Normal rate and regular rhythm.     Pulses: Normal pulses.      Heart sounds: Normal heart sounds. No murmur heard. Pulmonary:     Effort: Pulmonary effort is normal.     Breath sounds: Normal breath sounds. No wheezing, rhonchi or rales.  Abdominal:     General: Abdomen is flat. Bowel sounds are normal. There is no distension.     Palpations: Abdomen is soft.     Tenderness: There is no abdominal tenderness.  Musculoskeletal:        General: No swelling or deformity.     Cervical back: Normal range of motion.  Skin:    General: Skin is warm and dry.     Capillary Refill: Capillary refill takes less than 2 seconds.  Neurological:     General: No focal deficit present.     Mental Status: He is alert and oriented to person, place, and time.     Motor: No weakness.     Gait: Gait abnormal (Antalgic gait).  Psychiatric:        Mood and Affect: Mood normal.        Behavior: Behavior normal.        Thought Content: Thought content normal.   Last CBC Lab Results  Component Value Date   WBC 6.4 06/26/2023   HGB 16.6 06/26/2023   HCT 48.5 06/26/2023   MCV 91.0 06/26/2023   MCH 31.1 06/26/2023   RDW 13.2 06/26/2023   PLT 190 06/26/2023   Last metabolic panel Lab Results  Component Value Date   GLUCOSE 184 (H) 10/01/2023   NA 134 (L) 10/01/2023   K 3.9 10/01/2023   CL 100 10/01/2023   CO2 24 10/01/2023   BUN 23 (H) 10/01/2023   CREATININE 1.20 10/01/2023   GFRNONAA >60 10/01/2023   CALCIUM 9.3 10/01/2023   PROT 7.2 06/24/2023   ALBUMIN 4.0 06/24/2023   LABGLOB 2.9 05/22/2023   AGRATIO 1.6 05/22/2023   BILITOT 0.4 06/24/2023   ALKPHOS 77 06/24/2023   AST 25 06/24/2023   ALT 40 06/24/2023   ANIONGAP 10 10/01/2023   Last lipids Lab Results  Component Value Date   CHOL 161 06/25/2023   HDL 38 (L) 06/25/2023   LDLCALC 93 06/25/2023   TRIG 149 06/25/2023   CHOLHDL 4.2 06/25/2023   Last hemoglobin A1c Lab Results  Component Value Date   HGBA1C 7.4 (H) 06/24/2023   Last thyroid functions Lab Results  Component Value Date    TSH 1.559 06/24/2023     Assessment & Plan:   Problem List  Items Addressed This Visit       Essential hypertension, benign    BP today is 111/69.  Carvedilol has recently been reduced to 3.125 mg twice daily.  Lisinopril currently held.  No medication changes are indicated today.      Coronary artery disease involving native coronary artery of native heart with angina pectoris [facility])    History of CAD s/p CABG x 3 in August 2006.  Currently on ASA, statin, and beta-blocker therapy.  Closely followed by cardiology.  Denies recent chest pain.      Ischemic cardiomyopathy    EF 50% on TTE from June.  Euvolemic on exam today.  Closely followed by cardiology.  He is currently prescribed carvedilol, Farxiga, Lasix, and lisinopril.  Lisinopril and Lasix are currently on hold.      Type 2 diabetes mellitus (HCC) (Chronic)    A1c 7.4 in June.  He is currently prescribed Farxiga and endorses making dietary changes in an effort to improve his blood sugar.   -No medication changes are indicated today -Currently on ACEi and statin therapy -Repeat A1c at follow-up in 3 months      CKD (chronic kidney disease) stage 2, GFR 60-89 ml/min    Previously documented history of CKD stage II.  Currently prescribed Farxiga and lisinopril.  Lisinopril currently being held -In the setting of hypotension.  No medication changes have been made today      Mixed hyperlipidemia    Lipid panel updated in June.  Total cholesterol 161 and LDL 93.  He is currently prescribed rosuvastatin 40 mg daily.      Tobacco use disorder    Reports smoking 10 cigars daily.  No current interest in quitting, though NRT (patches) has been prescribed previously.      Positive colorectal cancer screening using Cologuard test    He has been referred to gastroenterology in the setting of a positive Cologuard test.  He will undergo colonoscopy on Thursday (10/3).      Right hip pain - Primary    History of right hip  arthroplasty.  He endorses right anterior hip pain today and ambulates with antalgic gait. -Flexeril prescribed for as needed pain relief       Return in about 3 months (around 01/01/2024).    Billie Lade, MD

## 2023-10-01 NOTE — Patient Instructions (Signed)
It was a pleasure to see you today.  Thank you for giving [place] the opportunity to be involved in your care.  Below is a brief recap of your visit and next steps.  We will plan to see you again in 3 months.  Summary No medication changes today Ophthalmology referral placed Follow up in 3 months

## 2023-10-02 NOTE — Anesthesia Preprocedure Evaluation (Signed)
Anesthesia Evaluation  Patient identified by MRN, date of birth, ID band Patient awake    Reviewed: Allergy & Precautions, NPO status , Patient's Chart, lab work & pertinent test results  History of Anesthesia Complications Negative for: history of anesthetic complications  Airway Mallampati: II  TM Distance: >3 FB Neck ROM: Full    Dental  (+) Edentulous Upper, Edentulous Lower, Dental Advisory Given   Pulmonary neg shortness of breath, neg sleep apnea, COPD, neg recent URI, Current Smoker and Patient abstained from smoking. Stop Bang 4   Pulmonary exam normal breath sounds clear to auscultation       Cardiovascular hypertension, Pt. on medications and Pt. on home beta blockers (-) angina + CAD, + Past MI, + CABG and + Peripheral Vascular Disease  Normal cardiovascular exam Rhythm:Regular  Reviewed report. LVEF low normal range at 50-55%. There was no obvious LV mural thrombus described although he has wall motion abnormalities at the LV apex as before, and a previous history of LV mural thrombus. As it relates to preoperative cardiac evaluation, he does not need any further testing.   Neuro/Psych  PSYCHIATRIC DISORDERS  Depression    CVA, No Residual Symptoms    GI/Hepatic negative GI ROS, Neg liver ROS,,,  Endo/Other  diabetes, Type 2    Renal/GU Renal InsufficiencyRenal diseasenegative Renal ROSStage 2 CKD     Musculoskeletal   Abdominal   Peds  Hematology negative hematology ROS (+)   Anesthesia Other Findings   Reproductive/Obstetrics                              Anesthesia Physical Anesthesia Plan  ASA: 3  Anesthesia Plan: General   Post-op Pain Management: Minimal or no pain anticipated   Induction: Intravenous  PONV Risk Score and Plan: Propofol infusion  Airway Management Planned: Natural Airway and Nasal Cannula  Additional Equipment: None  Intra-op Plan:    Post-operative Plan:   Informed Consent: I have reviewed the patients History and Physical, chart, labs and discussed the procedure including the risks, benefits and alternatives for the proposed anesthesia with the patient or authorized representative who has indicated his/her understanding and acceptance.     Dental advisory given  Plan Discussed with: CRNA  Anesthesia Plan Comments:          Anesthesia Quick Evaluation

## 2023-10-03 ENCOUNTER — Encounter (HOSPITAL_COMMUNITY): Payer: Self-pay

## 2023-10-03 ENCOUNTER — Encounter (HOSPITAL_COMMUNITY)
Admission: RE | Disposition: A | Discharge status: Home / Self Care | Payer: Self-pay | Source: Home / Self Care | Attending: Gastroenterology

## 2023-10-03 ENCOUNTER — Ambulatory Visit (HOSPITAL_COMMUNITY)
Admission: RE | Admit: 2023-10-03 | Discharge: 2023-10-03 | Disposition: A | Discharge status: Home / Self Care | Payer: Medicaid Other | Attending: Gastroenterology | Admitting: Gastroenterology

## 2023-10-03 ENCOUNTER — Ambulatory Visit (HOSPITAL_BASED_OUTPATIENT_CLINIC_OR_DEPARTMENT_OTHER): Payer: Medicaid Other | Admitting: Anesthesiology

## 2023-10-03 ENCOUNTER — Ambulatory Visit (HOSPITAL_COMMUNITY): Payer: Self-pay | Admitting: Anesthesiology

## 2023-10-03 DIAGNOSIS — Z951 Presence of aortocoronary bypass graft: Secondary | ICD-10-CM | POA: Insufficient documentation

## 2023-10-03 DIAGNOSIS — J449 Chronic obstructive pulmonary disease, unspecified: Secondary | ICD-10-CM | POA: Diagnosis not present

## 2023-10-03 DIAGNOSIS — I5032 Chronic diastolic (congestive) heart failure: Secondary | ICD-10-CM | POA: Diagnosis not present

## 2023-10-03 DIAGNOSIS — R195 Other fecal abnormalities: Secondary | ICD-10-CM

## 2023-10-03 DIAGNOSIS — I252 Old myocardial infarction: Secondary | ICD-10-CM | POA: Insufficient documentation

## 2023-10-03 DIAGNOSIS — K635 Polyp of colon: Secondary | ICD-10-CM | POA: Diagnosis not present

## 2023-10-03 DIAGNOSIS — F17290 Nicotine dependence, other tobacco product, uncomplicated: Secondary | ICD-10-CM | POA: Insufficient documentation

## 2023-10-03 DIAGNOSIS — Z1211 Encounter for screening for malignant neoplasm of colon: Secondary | ICD-10-CM | POA: Diagnosis not present

## 2023-10-03 DIAGNOSIS — K648 Other hemorrhoids: Secondary | ICD-10-CM | POA: Diagnosis not present

## 2023-10-03 DIAGNOSIS — K644 Residual hemorrhoidal skin tags: Secondary | ICD-10-CM | POA: Diagnosis not present

## 2023-10-03 DIAGNOSIS — D125 Benign neoplasm of sigmoid colon: Secondary | ICD-10-CM

## 2023-10-03 DIAGNOSIS — E1122 Type 2 diabetes mellitus with diabetic chronic kidney disease: Secondary | ICD-10-CM | POA: Diagnosis not present

## 2023-10-03 DIAGNOSIS — I2510 Atherosclerotic heart disease of native coronary artery without angina pectoris: Secondary | ICD-10-CM | POA: Insufficient documentation

## 2023-10-03 DIAGNOSIS — N182 Chronic kidney disease, stage 2 (mild): Secondary | ICD-10-CM | POA: Insufficient documentation

## 2023-10-03 DIAGNOSIS — I130 Hypertensive heart and chronic kidney disease with heart failure and stage 1 through stage 4 chronic kidney disease, or unspecified chronic kidney disease: Secondary | ICD-10-CM | POA: Insufficient documentation

## 2023-10-03 HISTORY — PX: COLONOSCOPY WITH PROPOFOL: SHX5780

## 2023-10-03 HISTORY — PX: POLYPECTOMY: SHX5525

## 2023-10-03 LAB — GLUCOSE, CAPILLARY: Glucose-Capillary: 119 mg/dL — ABNORMAL HIGH (ref 70–99)

## 2023-10-03 LAB — HM COLONOSCOPY

## 2023-10-03 SURGERY — COLONOSCOPY WITH PROPOFOL
Anesthesia: General

## 2023-10-03 MED ORDER — LACTATED RINGERS IV SOLN: INTRAVENOUS | Status: DC

## 2023-10-03 MED ORDER — EPHEDRINE SULFATE-NACL 50-0.9 MG/10ML-% IV SOSY
PREFILLED_SYRINGE | INTRAVENOUS | Status: DC | PRN
Start: 2023-10-03 — End: 2023-10-03
  Administered 2023-10-03 (×2): 10 mg via INTRAVENOUS

## 2023-10-03 MED ORDER — EPHEDRINE 5 MG/ML INJ
INTRAVENOUS | Status: AC
  Filled 2023-10-03: qty 5

## 2023-10-03 MED ORDER — PROPOFOL 1000 MG/100ML IV EMUL
INTRAVENOUS | Status: AC
  Filled 2023-10-03: qty 100

## 2023-10-03 MED ORDER — PHENYLEPHRINE 80 MCG/ML (10ML) SYRINGE FOR IV PUSH (FOR BLOOD PRESSURE SUPPORT)
PREFILLED_SYRINGE | INTRAVENOUS | Status: DC | PRN
Start: 2023-10-03 — End: 2023-10-03
  Administered 2023-10-03: 640 ug via INTRAVENOUS
  Administered 2023-10-03: 80 ug via INTRAVENOUS

## 2023-10-03 MED ORDER — PHENYLEPHRINE 80 MCG/ML (10ML) SYRINGE FOR IV PUSH (FOR BLOOD PRESSURE SUPPORT)
PREFILLED_SYRINGE | INTRAVENOUS | Status: AC
  Filled 2023-10-03: qty 10

## 2023-10-03 MED ORDER — LIDOCAINE HCL (CARDIAC) PF 100 MG/5ML IV SOSY
PREFILLED_SYRINGE | INTRAVENOUS | Status: DC | PRN
  Administered 2023-10-03: 80 mg via INTRAVENOUS

## 2023-10-03 MED ORDER — LIDOCAINE HCL (PF) 2 % IJ SOLN
INTRAMUSCULAR | Status: AC
  Filled 2023-10-03: qty 10

## 2023-10-03 MED ORDER — PROPOFOL 500 MG/50ML IV EMUL
INTRAVENOUS | Status: DC | PRN
  Administered 2023-10-03: 150 ug/kg/min via INTRAVENOUS

## 2023-10-03 MED ORDER — PROPOFOL 10 MG/ML IV BOLUS
INTRAVENOUS | Status: DC | PRN
  Administered 2023-10-03: 100 mg via INTRAVENOUS

## 2023-10-03 NOTE — Op Note (Signed)
[facility] Patient Name: Steve Vang Procedure Date: 10/03/2023 7:09 AM MRN: [id number] Date of Birth: 1966/08/02 Attending MD: Sanjuan Dame , MD, [id number] CSN: [id number] Age: 57 Admit Type: Outpatient Procedure:                Colonoscopy Indications:              Positive Cologuard test Providers:                Sanjuan Dame, MD, Buel Ream. Thomasena Edis RN, RN,                            Elinor Parkinson Referring MD:              Medicines:                Propofol per Anesthesia Complications:            No immediate complications. Estimated blood loss:                            None. Estimated Blood Loss:     Estimated blood loss: none. Procedure:                Pre-Anesthesia Assessment:                           - Prior to the procedure, a History and Physical                            was performed, and patient medications and                            allergies were reviewed. The patient's tolerance of                            previous anesthesia was also reviewed. The risks                            and benefits of the procedure and the sedation                            options and risks were discussed with the patient.                            All questions were answered, and informed consent                            was obtained. Prior Anticoagulants: The patient has                            taken no anticoagulant or antiplatelet agents. ASA                            Grade Assessment: III - A patient with severe                            systemic disease.  After reviewing the risks and                            benefits, the patient was deemed in satisfactory                            condition to undergo the procedure.                           After obtaining informed consent, the colonoscope                            was passed under direct vision. Throughout the                            procedure, the patient's blood pressure, pulse, and                             oxygen saturations were monitored continuously. The                            [id number]) scope was introduced through the                            anus and advanced to the the terminal ileum. The                            colonoscopy was performed without difficulty. The                            patient tolerated the procedure well. The quality                            of the bowel preparation was evaluated using the                            BBPS [facility] Bowel Preparation Scale) with scores                            of: Right Colon = 3, Transverse Colon = 3 and Left                            Colon = 3 (entire mucosa seen well with no residual                            staining, small fragments of stool or opaque                            liquid). The total BBPS score equals 9. Scope In: 7:38:58 AM Scope Out: 8:02:09 AM Scope Withdrawal Time: 0 hours 21 minutes 19 seconds  Total Procedure Duration: 0 hours 23 minutes 11 seconds  Findings:      The perianal and digital rectal examinations were normal.  Two sessile polyps were found in the sigmoid colon. The polyps were 2 to       5 mm in size. These polyps were removed with a cold snare. Resection and       retrieval were complete.      Non-bleeding external and internal hemorrhoids were found during       retroflexion. The hemorrhoids were small.      The terminal ileum appeared normal. Impression:               - Two 2 to 5 mm polyps in the sigmoid colon,                            removed with a cold snare. Resected and retrieved.                           - Non-bleeding external and internal hemorrhoids.                           - The examined portion of the ileum was normal. Moderate Sedation:      Per Anesthesia Care Recommendation:           - Patient has a contact number available for                            emergencies. The signs and symptoms of potential                             delayed complications were discussed with the                            patient. Return to normal activities tomorrow.                            Written discharge instructions were provided to the                            patient.                           - Resume previous diet.                           - Continue present medications.                           - Await pathology results.                           - Repeat colonoscopy in 7-10 years for surveillance. Procedure Code(s):        --- Professional ---                           [phone], Colonoscopy, flexible; with removal of                            tumor(s), polyp(s), or other lesion(s) by snare  technique Diagnosis Code(s):        --- Professional ---                           D12.5, Benign neoplasm of sigmoid colon                           K64.8, Other hemorrhoids                           R19.5, Other fecal abnormalities CPT copyright 2022 American [employer] Association. All rights reserved. The codes documented in this report are preliminary and upon coder review may  be revised to meet current compliance requirements. Sanjuan Dame, MD Sanjuan Dame, MD 10/03/2023 8:16:47 AM This report has been signed electronically. Number of Addenda: 0

## 2023-10-03 NOTE — Transfer of Care (Addendum)
Immediate Anesthesia Transfer of Care Note  Patient: Steve Vang  Procedure(s) Performed: COLONOSCOPY WITH PROPOFOL POLYPECTOMY  Patient Location: Short Stay  Anesthesia Type:General  Level of Consciousness: drowsy and patient cooperative  Airway & Oxygen Therapy: Patient Spontanous Breathing  Post-op Assessment: Report given to RN and Post -op Vital signs reviewed and stable  Post vital signs: Reviewed and stable  Last Vitals:  Vitals Value Taken Time  BP 85/47 10/03/23 0808  Temp    Pulse 58 10/03/23 0808  Resp 23 10/03/23 0808  SpO2 97 % 10/03/23 0808    Last Pain:  Vitals:   10/03/23 0808  PainSc: Asleep         Complications: Blood pressure treated with ephedrine. Subsequent blood pressure 105/56.

## 2023-10-03 NOTE — Anesthesia Postprocedure Evaluation (Signed)
Anesthesia Post Note  Patient: Steve Vang  Procedure(s) Performed: COLONOSCOPY WITH PROPOFOL POLYPECTOMY  Patient location during evaluation: PACU Anesthesia Type: General Level of consciousness: awake and alert Pain management: pain level controlled Vital Signs Assessment: post-procedure vital signs reviewed and stable Respiratory status: spontaneous breathing, nonlabored ventilation, respiratory function stable and patient connected to nasal cannula oxygen Cardiovascular status: blood pressure returned to baseline and stable Postop Assessment: no apparent nausea or vomiting Anesthetic complications: no   There were no known notable events for this encounter.   Last Vitals:  Vitals:   10/03/23 0808 10/03/23 0812  BP: (!) 85/47 (!) 105/56  Pulse: (!) 58 (!) 57  Resp: (!) 23 19  Temp:    SpO2: 97% 96%    Last Pain:  Vitals:   10/03/23 0812  PainSc: 0-No pain                 Eloisa Chokshi L Ameyah Bangura

## 2023-10-03 NOTE — H&P (Signed)
Primary Care Physician:  Billie Lade, MD Primary Gastroenterologist:  Dr. Tasia Catchings  Pre-Procedure History & Physical: HPI:  Steve Vang is a 57 y.o. male presenting for diagnostic colonoscopy for  for positive Cologuard.   Cologuard was positive on 07/06/2023. Recent CBC June 2024 within normal limits. Not sure about colon cancer in the family.    No abdominal pain or unintentional weight loss.  No change in bowel habits.  Overall feels well from a GI standpoint.  Past Medical History:  Diagnosis Date   CAD (coronary artery disease)    Multivessel, LVEF 45-50%   Cardiomyopathy (HCC)    LVEF 50-55% December 2016   CKD (chronic kidney disease) stage 2, GFR 60-89 ml/min    COPD (chronic obstructive pulmonary disease) (HCC)    Diastolic heart failure (HCC)    Essential hypertension    History of stroke 2004   Hyperlipidemia    Left ventricular mural thrombus    MDD in remission    Not   Myocardial infarction [facility])    Stroke (HCC)    T2DM (type 2 diabetes mellitus) (HCC) 06/24/2023   Diagnoised with Hgb a1c of 7.5   Tobacco use disorder     Past Surgical History:  Procedure Laterality Date   CORONARY ARTERY BYPASS GRAFT     DOR anterior ventricular restoration surgery 8/06, [facility] [place] to first diagonal, SVG to PLB, SVG to RVE branch of nondominant RCA   EYE SURGERY     TOTAL HIP ARTHROPLASTY Left 06/08/2016   Procedure: TOTAL HIP ARTHROPLASTY ANTERIOR APPROACH;  Surgeon: Kathryne Hitch, MD;  Location: [facility];  Service: Orthopedics;  Laterality: Left;   TOTAL HIP ARTHROPLASTY Right 07/20/2016   Procedure: RIGHT TOTAL HIP ARTHROPLASTY ANTERIOR APPROACH;  Surgeon: Kathryne Hitch, MD;  Location: [facility];  Service: Orthopedics;  Laterality: Right;    Prior to Admission medications   Medication Sig Start Date End Date Taking? Authorizing Provider  aspirin EC 81 MG tablet Take 81 mg by mouth daily.   Yes [provider]  Blood Glucose Monitoring  Suppl DEVI 1 each by Does not apply route in the morning, at noon, and at bedtime. May substitute to any manufacturer covered by patient's insurance. 06/26/23  Yes Sherryll Burger, Pratik D, DO  carvedilol (COREG) 6.25 MG tablet Take 0.5 tablets (3.125 mg total) by mouth 2 (two) times daily with a meal. 09/24/23  Yes Sharlene Dory, NP  cyclobenzaprine (FLEXERIL) 10 MG tablet Take 1 tablet (10 mg total) by mouth 3 (three) times daily as needed for muscle spasms. 10/01/23  Yes Billie Lade, MD  isosorbide mononitrate (IMDUR) 60 MG 24 hr tablet Take 1 tablet (60 mg total) by mouth daily. 07/23/23 09/23/24 Yes Sharlene Dory, NP  nitroGLYCERIN (NITROSTAT) 0.4 MG SL tablet Place 1 tablet (0.4 mg total) under the tongue every 5 (five) minutes x 3 doses as needed for chest pain (If no relief after 3rd dose, proceed to ED or call 911). 05/17/23  Yes Jonelle Sidle, MD  ranolazine (RANEXA) 500 MG 12 hr tablet Take 1 tablet (500 mg total) by mouth 2 (two) times daily. 07/23/23 12/20/23 Yes Sharlene Dory, NP  rosuvastatin (CRESTOR) 40 MG tablet Take 1 tablet (40 mg total) by mouth daily. 07/01/23  Yes Gardenia Phlegm, MD  Sod Picosulfate-Mag Ox-Cit Acd [facility]) 10-3.5-12 MG-GM -GM/175ML SOLN Take 1 kit by mouth as directed. 09/16/23  Yes Yusuke Beza, Juanetta Beets, MD  dapagliflozin propanediol (FARXIGA) 10 MG TABS tablet  Take 1 tablet (10 mg total) by mouth daily. 07/23/23 07/17/24  Sharlene Dory, NP  nicotine (NICODERM CQ - DOSED IN MG/24 HOURS) 21 mg/24hr patch Place 1 patch (21 mg total) onto the skin daily. Patient not taking: Reported on 10/01/2023 07/01/23   Gardenia Phlegm, MD    Allergies as of 09/16/2023 - Review Complete 08/20/2023  Allergen Reaction Noted   Benadryl [diphenhydramine] Hives and Swelling 07/18/2014   Chantix [varenicline] Nausea And Vomiting 05/25/2016   Latex Swelling and Rash 07/01/2023    Family History  Problem Relation Age of Onset   Hypertension Mother    Diabetes Mother     Social History    Socioeconomic History   Marital status: Married    Spouse name: Not on file   Number of children: Not on file   Years of education: Not on file   Highest education level: Not on file  Occupational History   Not on file  Tobacco Use   Smoking status: Some Days    Types: Cigars   Smokeless tobacco: Never   Tobacco comments:    Vaping as well  Vaping Use   Vaping status: Never Used  Substance and Sexual Activity   Alcohol use: No    Alcohol/week: 0.0 standard drinks of alcohol    Comment: hx of alcohol use    Drug use: Yes    Types: Marijuana    Comment:  marijuana use   Sexual activity: Yes    Partners: Female  Other Topics Concern   Not on file  Social History Narrative   Not on file   Social Determinants of Health   Financial Resource Strain: Not on file  Food Insecurity: No Food Insecurity (06/24/2023)   Hunger Vital Sign    Worried About Running Out of Food in the Last Year: Never true    Ran Out of Food in the Last Year: Never true  Transportation Needs: No Transportation Needs (06/24/2023)   PRAPARE - Administrator, Civil Service (Medical): No    Lack of Transportation (Non-Medical): No  Physical Activity: Not on file  Stress: Not on file  Social Connections: Unknown (05/20/2023)   Received from [facility]   Social Network    Social Network: Not on file  Intimate Partner Violence: Not At Risk (06/24/2023)   Humiliation, Afraid, Rape, and Kick questionnaire    Fear of Current or Ex-Partner: No    Emotionally Abused: No    Physically Abused: No    Sexually Abused: No    Review of Systems: See HPI, otherwise negative ROS  Physical Exam: Vital signs in last 24 hours: Temp:  [97.7 F (36.5 C)] 97.7 F (36.5 C) (10/03 0648) Pulse Rate:  [65] 65 (10/03 0648) Resp:  [20] 20 (10/03 0648) BP: (137)/(84) 137/84 (10/03 0648) SpO2:  [97 %] 97 % (10/03 0648) Weight:  [73.6 kg] 73.6 kg (10/03 0630)   General:   Alert,   Well-developed, well-nourished, pleasant and cooperative in NAD Head:  Normocephalic and atraumatic. Eyes:  Sclera clear, no icterus.   Conjunctiva pink. Ears:  Normal auditory acuity. Nose:  No deformity, discharge,  or lesions. Msk:  Symmetrical without gross deformities. Normal posture. Extremities:  Without clubbing or edema. Neurologic:  Alert and  oriented x4;  grossly normal neurologically. Skin:  Intact without significant lesions or rashes. Psych:  Alert and cooperative. Normal mood and affect.  Impression/Plan:Steve Vang is a 57 y.o. male presenting for diagnostic colonoscopy  for  for positive Cologuard.    The risks of the procedure including infection, bleed, or perforation as well as benefits, limitations, alternatives and imponderables have been reviewed with the patient. Questions have been answered. All parties agreeable.

## 2023-10-03 NOTE — Discharge Instructions (Signed)

## 2023-10-04 ENCOUNTER — Encounter (INDEPENDENT_AMBULATORY_CARE_PROVIDER_SITE_OTHER): Payer: Self-pay | Admitting: *Deleted

## 2023-10-07 LAB — SURGICAL PATHOLOGY

## 2023-10-08 ENCOUNTER — Ambulatory Visit: Payer: Medicaid Other | Admitting: Nutrition

## 2023-10-09 ENCOUNTER — Encounter: Payer: Self-pay | Admitting: Internal Medicine

## 2023-10-09 ENCOUNTER — Encounter (INDEPENDENT_AMBULATORY_CARE_PROVIDER_SITE_OTHER): Payer: Self-pay | Admitting: *Deleted

## 2023-10-09 DIAGNOSIS — M25551 Pain in right hip: Secondary | ICD-10-CM | POA: Insufficient documentation

## 2023-10-09 NOTE — Assessment & Plan Note (Signed)
History of CAD s/p CABG x 3 in August 2006.  Currently on ASA, statin, and beta-blocker therapy.  Closely followed by cardiology.  Denies recent chest pain.

## 2023-10-09 NOTE — Assessment & Plan Note (Signed)
A1c 7.4 in June.  He is currently prescribed Marcelline Deist and endorses making dietary changes in an effort to improve his blood sugar.   -No medication changes are indicated today -Currently on ACEi and statin therapy -Repeat A1c at follow-up in 3 months

## 2023-10-09 NOTE — Assessment & Plan Note (Signed)
Reports smoking 10 cigars daily.  No current interest in quitting, though NRT (patches) has been prescribed previously.

## 2023-10-09 NOTE — Assessment & Plan Note (Signed)
He has been referred to gastroenterology in the setting of a positive Cologuard test.  He will undergo colonoscopy on Thursday (10/3).

## 2023-10-09 NOTE — Assessment & Plan Note (Signed)
Lipid panel updated in June.  Total cholesterol 161 and LDL 93.  He is currently prescribed rosuvastatin 40 mg daily.

## 2023-10-09 NOTE — Assessment & Plan Note (Signed)
EF 50% on TTE from June.  Euvolemic on exam today.  Closely followed by cardiology.  He is currently prescribed carvedilol, Farxiga, Lasix, and lisinopril.  Lisinopril and Lasix are currently on hold.

## 2023-10-09 NOTE — Assessment & Plan Note (Signed)
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  Currently prescribed Farxiga and lisinopril.  Lisinopril currently being held -In the setting of hypotension.  No medication changes have been made today

## 2023-10-09 NOTE — Assessment & Plan Note (Signed)
BP today is 111/69.  Carvedilol has recently been reduced to 3.125 mg twice daily.  Lisinopril currently held.  No medication changes are indicated today.

## 2023-10-09 NOTE — Assessment & Plan Note (Signed)
History of right hip arthroplasty.  He endorses right anterior hip pain today and ambulates with antalgic gait. -Flexeril prescribed for as needed pain relief

## 2023-10-11 ENCOUNTER — Encounter (HOSPITAL_COMMUNITY): Payer: Self-pay | Admitting: Gastroenterology

## 2023-11-20 ENCOUNTER — Telehealth: Payer: Self-pay | Admitting: Internal Medicine

## 2023-11-20 ENCOUNTER — Other Ambulatory Visit: Payer: Self-pay

## 2023-11-20 DIAGNOSIS — M25551 Pain in right hip: Secondary | ICD-10-CM

## 2023-11-20 MED ORDER — CYCLOBENZAPRINE HCL 10 MG PO TABS: 10.0000 mg | ORAL_TABLET | Freq: Three times a day (TID) | ORAL | 0 refills | Status: DC | PRN

## 2023-11-20 NOTE — Telephone Encounter (Signed)
Refill sent.

## 2023-11-20 NOTE — Telephone Encounter (Signed)
Prescription Request  11/20/2023  LOV: 10/01/2023  What is the name of the medication or equipment? cyclobenzaprine (FLEXERIL) 10 MG tablet [[id number]]    Have you contacted your pharmacy to request a refill? No   Which pharmacy would you like this sent to?  [facility] [address] [place] - [address] [place] [address] Phone: [phone] Fax: [phone]    Patient notified that their request is being sent to the clinical staff for review and that they should receive a response within 2 business days.   Please advise at [facility] [phone]

## 2023-11-26 ENCOUNTER — Ambulatory Visit: Payer: Medicaid Other | Attending: Nurse Practitioner | Admitting: Nurse Practitioner

## 2023-11-26 ENCOUNTER — Encounter: Payer: Self-pay | Admitting: Nurse Practitioner

## 2023-11-26 VITALS — BP 115/78 | HR 75 | Ht 67.0 in | Wt 166.0 lb

## 2023-11-26 DIAGNOSIS — I6523 Occlusion and stenosis of bilateral carotid arteries: Secondary | ICD-10-CM

## 2023-11-26 DIAGNOSIS — I513 Intracardiac thrombosis, not elsewhere classified: Secondary | ICD-10-CM | POA: Diagnosis not present

## 2023-11-26 DIAGNOSIS — R42 Dizziness and giddiness: Secondary | ICD-10-CM

## 2023-11-26 DIAGNOSIS — I5032 Chronic diastolic (congestive) heart failure: Secondary | ICD-10-CM

## 2023-11-26 DIAGNOSIS — E782 Mixed hyperlipidemia: Secondary | ICD-10-CM

## 2023-11-26 DIAGNOSIS — Z8673 Personal history of transient ischemic attack (TIA), and cerebral infarction without residual deficits: Secondary | ICD-10-CM

## 2023-11-26 DIAGNOSIS — I2510 Atherosclerotic heart disease of native coronary artery without angina pectoris: Secondary | ICD-10-CM

## 2023-11-26 DIAGNOSIS — J449 Chronic obstructive pulmonary disease, unspecified: Secondary | ICD-10-CM

## 2023-11-26 DIAGNOSIS — Z720 Tobacco use: Secondary | ICD-10-CM

## 2023-11-26 MED ORDER — CARVEDILOL 6.25 MG PO TABS: 3.1250 mg | ORAL_TABLET | Freq: Two times a day (BID) | ORAL | 1 refills | Status: DC

## 2023-11-26 NOTE — Patient Instructions (Addendum)
Medication Instructions:  Your physician recommends that you continue on your current medications as directed. Please refer to the Current Medication list given to you today. Labwork: Please have previously ordered labs done within the next 1-2 weeks   Testing/Procedures: None   Follow-Up: Your physician recommends that you schedule a follow-up appointment in: 4-6 months   Any Other Special Instructions Will Be Listed Below (If Applicable).  If you need a refill on your cardiac medications before your next appointment, please call your pharmacy.

## 2023-11-26 NOTE — Progress Notes (Unsigned)
Office Visit    Patient Name: [name] Date of Encounter: 11/26/2023 PCP:  Billie Lade, MD [facility]  Cardiologist:  [name], MD  Advanced Practice Provider:  No care team member to display Electrophysiologist:  None  0746} Chief Complaint and HPI    ICHOLAS Vang is a 57 y.o. male with a hx of multivessel CAD, s/p CABG in 2006, HFimpEF, past history of LV mural thrombus, hypertension, mixed hyperlipidemia, T2DM, prior history of CVA, chronic dizziness, COPD, positive cologuard, and CKD, who presents today for scheduled follow-up.  Last seen by Dr. Diona Browner on May 17, 2023.  He reported exertional chest tightness, left arm discomfort consistent with angina.  Symptoms resolved with rest, denied any nitroglycerin use.  Imdur was increased to 60 mg daily.  Myoview was arranged and revealed evidence of prior MI, study was deemed intermediate to high risk based on large area of scar along apical area with decreased LVEF, no current ischemia noted during testing.   Hospital admission for evaluation of stable angina, noted to have acute on chronic diastolic CHF exacerbation, required IV Lasix.  Cardiology saw patient and adjusted medications.  Newly diagnosed with type 2 diabetes.  Echocardiogram revealed improved EF at 50%.  Could not tolerate Imdur at 90 mg, therefore decreased to 60 mg daily.   Last seen for follow-up on September 24, 2023.  Had been dealing with upper respiratory symptoms at that time. Denied any chest pain, shortness of breath, palpitations, syncope, presyncope, orthopnea, PND, swelling or significant weight changes, acute bleeding, or claudication.  Noted some intermittent dizziness at times, this has been chronic ever since his stroke, stable over time.  Was pending a colonoscopy on October 03, 2023, wife stated he had a previous Cologuard test that came back positive.  Underwent colonoscopy on October 03, 2023, 2 polyps were removed in  the sigmoid colon.  Today he presents for follow-up.  He is doing well and denies any acute cardiac monitor issues.  His dizziness remains stable ever since his stroke. Denies any chest pain, shortness of breath, palpitations, syncope, presyncope, dizziness, orthopnea, PND, swelling or significant weight changes, acute bleeding, or claudication.  EKGs/Labs/Other Studies Reviewed:   The following studies were reviewed today:   EKG:  EKG is not ordered today.   Carotid duplex 07/2023: Summary:  Right Carotid: Velocities in the right ICA are consistent with a 1-39%  stenosis.   Left Carotid: Velocities in the left ICA are consistent with a 1-39%  stenosis.   Vertebrals: Bilateral vertebral arteries demonstrate antegrade flow.  Subclavians: Normal flow hemodynamics were seen in bilateral subclavian arteries.  Echo 06/2023:   1. Left ventricular ejection fraction, by estimation, is 50%. The left  ventricle has low normal function. The left ventricle demonstrates  regional wall motion abnormalities (see scoring diagram/findings for  description). Left ventricular diastolic  parameters are consistent with Grade II diastolic dysfunction  (pseudonormalization).   2. Right ventricular systolic function is mildly reduced. The right  ventricular size is normal.   3. The mitral valve is grossly normal. No evidence of mitral valve  regurgitation. No evidence of mitral stenosis.   4. The aortic valve has an indeterminant number of cusps. Aortic valve  regurgitation is not visualized. No aortic stenosis is present.   Conclusion(s)/Recommendation(s): No left ventricular mural or apical  thrombus/thrombi.  Myoview 05/2023:   Findings are consistent with large extensive apical infarction. Intermediate to high risk study based on large  area of scar and decreased LVEF. There is no current myocardium at jeopardy.  Consider correlating LVEF with echo.   No ST deviation was noted.   LV perfusion is  abnormal. Large severe intensity fixed extensive apical defect consistent with large area of scar. There is no current ischemia.   Left ventricular function is abnormal. Nuclear stress EF: 35 %. The left ventricular ejection fraction is moderately decreased (30-44%). End diastolic cavity size is severely enlarged.  Review of Systems    All other systems reviewed and are otherwise negative except as noted above.  Physical Exam    VS:  BP 115/78   Pulse 75   Ht 5\' 7"  (1.702 m)   Wt 166 lb (75.3 kg)   SpO2 94%   BMI 26.00 kg/m  , BMI Body mass index is 26 kg/m.  Wt Readings from Last 3 Encounters:  11/26/23 166 lb (75.3 kg)  10/03/23 162 lb 4.1 oz (73.6 kg)  10/01/23 159 lb 9.6 oz (72.4 kg)    GEN: Well nourished, well developed, in no acute distress. HEENT: normal. Neck: Supple, no JVD, carotid bruits, or masses. Cardiac: S1/S2, RRR, no murmurs, rubs, or gallops. No clubbing, cyanosis, edema.  Radials/PT 2+ and equal bilaterally.  Respiratory:  Respirations regular and unlabored, clear and diminished to auscultation bilaterally MS: No deformity or atrophy. Skin: Warm and dry, no rash. Neuro:  Strength and sensation are intact. Psych: Normal affect.  Assessment & Plan    Multivessel CAD, s/p CABG in 2006 Stable with no anginal symptoms. No indication for ischemic evaluation.  Myoview 05/2023 was negative for any current ischemia.  Continue aspirin, carvedilol, Imdur, rosuvastatin, Ranexa, and nitroglycerin as needed. Lisinopril is currently on hold at this time d/t BP and past kidney function - has not had previous lab work performed that was previously ordered. Have instructed patient to have these labs drawn. Smoking cessation encouraged and discussed.  Heart healthy diet encouraged.  ED precautions discussed.  HFimpEF Stage C, NYHA class I symptoms. TTE 06/2023 revealed EF 50%.  Euvolemic and well compensated on exam.  Continue Coreg, farxiga, and Imdur as mentioned above.  Lisinopril and Lasix have been on hold  d/t his past kidney function - see above. Will obtain labs as mentioned above. Low sodium diet, fluid restriction <2L, and daily weights encouraged. Educated to contact our office for weight gain of 2 lbs overnight or 5 lbs in one week.    Mixed hyperlipidemia Continue rosuvastatin since he is tolerating this well. Heart healthy diet and regular cardiovascular exercise encouraged. Will obtain labs as previously mentioned.   4.  History of LV mural thrombus Resolved, no longer on Coumadin. TTE 06/2023 showed no LV mural or apical thrombus.   5. Carotid artery disease Mild bilateral carotid artery disease noted on carotid duplex July 2024, 1 to 39% ICA stenosis bilaterally.  Continue current medication regimen.  Heart healthy diet and regular cardiovascular exercise encouraged.   6. COPD Denies any recent acute exacerbations or worsening symptoms.  No medication changes at this time.  Continue to follow-up with PCP.    7. Tobacco abuse  Smoking cessation encouraged and discussed.  8. History of CVA, dizziness/lightheadedness Denies any concerning/red flag symptoms.  Chronic, stable dizziness/lightheadedness ever since stroke.  Care and ED precautions discussed.  Continue current medication regimen. Continue to follow with PCP. Heart healthy diet and regular cardiovascular exercise encouraged.   Disposition: Follow up in 4-6 months with [name], MD or APP.  Signed,  Sharlene Dory, NP

## 2023-12-19 ENCOUNTER — Telehealth: Payer: Self-pay | Admitting: Internal Medicine

## 2023-12-19 DIAGNOSIS — M25551 Pain in right hip: Secondary | ICD-10-CM

## 2023-12-19 MED ORDER — CYCLOBENZAPRINE HCL 10 MG PO TABS: 10.0000 mg | ORAL_TABLET | Freq: Three times a day (TID) | ORAL | 3 refills | Status: DC | PRN

## 2023-12-19 NOTE — Telephone Encounter (Signed)
Prescription Request  12/19/2023  LOV: 10/01/2023  What is the name of the medication or equipment? cyclobenzaprine (FLEXERIL) 10 MG tablet [[id number]]   Have you contacted your pharmacy to request a refill? No   Which pharmacy would you like this sent to?  [facility] [address] [place] - [address] [place] [address] Phone: [phone] Fax: [phone]    Patient notified that their request is being sent to the clinical staff for review and that they should receive a response within 2 business days.   Please advise at  walked into office

## 2024-01-03 ENCOUNTER — Ambulatory Visit: Payer: Medicaid Other | Admitting: Internal Medicine

## 2024-01-09 ENCOUNTER — Telehealth: Payer: Self-pay

## 2024-01-09 DIAGNOSIS — Z122 Encounter for screening for malignant neoplasm of respiratory organs: Secondary | ICD-10-CM

## 2024-01-09 DIAGNOSIS — Z87891 Personal history of nicotine dependence: Secondary | ICD-10-CM

## 2024-01-09 NOTE — Telephone Encounter (Signed)
.  Lung Cancer Screening Narrative/Criteria Questionnaire (Cigarette Smokers Only- No Cigars/Pipes/vapes)   Steve Vang     SDMV:01/30/2024 Kristen  1966/07/28              LDCT: 02/04/2024 2:00 pm AP    57 y.o.   Phone: [phone]  Lung Screening Narrative   Before calling, confirm age (50-77 yrs Medicare / 50-80 yrs Private pay insurance)  Insurance information: Medicaid   I am calling at the request of Dr. Golda (referring provider) to schedule you for a lung screening.  Did your provider discuss this with you? Yes   This screening involves an initial meeting with our NP, who is the Nurse Navigator for the program.  It is called a shared decision making visit.  The initial meeting is required by insurance and Medicare to make sure you understand the program.  This appointment takes about 20 minutes to complete.  After you have spoken with the provider, we will schedule you for your screening scan.  This scan takes about 5-10 minutes to complete.  You can eat and drink normally before and after the scan.    I am going to ask you a few questions to make sure you meet the criteria to participate in the program.    Are you a current or former smoker? Former Age began smoking: teenager 14   If you are a former smoker, what year did you quit smoking? 2020 (must be within 15 yrs)    To calculate your smoking history, I need an accurate estimate of how many packs of cigarettes you smoked per day and   for how many years.    Years smoking 43 x Packs per day 2 = Pack years 66   (at least 20 pack yrs)   (Make sure they understand that we need to know how much they have smoked in the past, not just the number of PPD they are smoking now)  Do you have a personal history of cancer?  No    Do you have a family history of cancer? Yes  (cancer type and and relative) mother lung aunts/uncles unsure what type   Are you having any of the following symptoms?  Coughing up blood?  No  Weight loss of 15  lbs or more in the last 6 months without trying / you cannot explain  No  It looks like you meet all criteria.  When would be a good time for us  to schedule you for this screening?   Additional information: History of stroke wife will assist with call.

## 2024-01-17 ENCOUNTER — Encounter: Payer: Self-pay | Admitting: Internal Medicine

## 2024-01-21 ENCOUNTER — Encounter: Payer: Self-pay | Admitting: Internal Medicine

## 2024-01-21 ENCOUNTER — Ambulatory Visit: Payer: Medicaid Other | Admitting: Internal Medicine

## 2024-01-21 VITALS — BP 132/76 | HR 79 | Ht 67.0 in | Wt 166.6 lb

## 2024-01-21 DIAGNOSIS — N182 Chronic kidney disease, stage 2 (mild): Secondary | ICD-10-CM

## 2024-01-21 DIAGNOSIS — E1165 Type 2 diabetes mellitus with hyperglycemia: Secondary | ICD-10-CM | POA: Diagnosis not present

## 2024-01-21 DIAGNOSIS — F17200 Nicotine dependence, unspecified, uncomplicated: Secondary | ICD-10-CM

## 2024-01-21 DIAGNOSIS — I10 Essential (primary) hypertension: Secondary | ICD-10-CM | POA: Diagnosis not present

## 2024-01-21 DIAGNOSIS — Z7984 Long term (current) use of oral hypoglycemic drugs: Secondary | ICD-10-CM

## 2024-01-21 MED ORDER — LISINOPRIL 2.5 MG PO TABS
2.5000 mg | ORAL_TABLET | Freq: Every day | ORAL | 3 refills | Status: DC
Start: 2024-01-21 — End: 2024-10-08

## 2024-01-21 NOTE — Assessment & Plan Note (Addendum)
 BP elevated initially today, improved to 136/72 on repeat.  He is prescribed carvedilol 3.125 mg twice daily and Imdur 60 mg daily.  We discussed resuming low-dose lisinopril in the setting of diabetes mellitus and CKD.  He is in agreement. -Start lisinopril 2.5 mg daily

## 2024-01-21 NOTE — Progress Notes (Signed)
 Established Patient Office Visit  Subjective   Patient ID: Steve Vang, male    DOB: 13-Oct-1966  Age: 58 y.o. MRN: [id number]  Chief Complaint  Patient presents with   Care Management    Three month follow up    Referral    Referral to eye dr for diabetic eye exam    [name] returns to care today for routine follow-up.  He was last evaluated by me in October 2024.  No medication changes were made at that time and 65-month follow-up was arranged.  In the interim, he underwent colonoscopy on 10/3 in the setting of a positive Cologuard.  2 polyps were removed and internal/external hemorrhoids were identified.  Repeat colonoscopy recommended for 7 years.  He has also been seen by cardiology for follow-up.  There have otherwise been no acute interval events. [name] reports feeling well today.  He is asymptomatic and has no acute concerns to discuss.  Past Medical History:  Diagnosis Date   CAD (coronary artery disease)    Multivessel, LVEF 45-50%   Cardiomyopathy (HCC)    LVEF 50-55% December 2016   CKD (chronic kidney disease) stage 2, GFR 60-89 ml/min    COPD (chronic obstructive pulmonary disease) (HCC)    Diastolic heart failure (HCC)    Essential hypertension    History of stroke 2004   Hyperlipidemia    Left ventricular mural thrombus    MDD in remission    Not   Myocardial infarction [facility])    Stroke (HCC)    T2DM (type 2 diabetes mellitus) (HCC) 06/24/2023   Diagnoised with Hgb a1c of 7.5   Tobacco use disorder    Past Surgical History:  Procedure Laterality Date   COLONOSCOPY WITH PROPOFOL N/A 10/03/2023   Procedure: COLONOSCOPY WITH PROPOFOL;  Surgeon: Franky Macho, MD;  [facility]: [facility];  Service: Endoscopy;  Laterality: N/A;  730am, asa 3   CORONARY ARTERY BYPASS GRAFT     DOR anterior ventricular restoration surgery 8/06, [facility] - LIMA to first diagonal, SVG to PLB, SVG to RVE branch of nondominant RCA   EYE SURGERY     POLYPECTOMY   10/03/2023   Procedure: POLYPECTOMY;  Surgeon: Franky Macho, MD;  Location: [facility];  Service: Endoscopy;;   TOTAL HIP ARTHROPLASTY Left 06/08/2016   Procedure: TOTAL HIP ARTHROPLASTY ANTERIOR APPROACH;  Surgeon: Kathryne Hitch, MD;  Location: [facility];  Service: Orthopedics;  Laterality: Left;   TOTAL HIP ARTHROPLASTY Right 07/20/2016   Procedure: RIGHT TOTAL HIP ARTHROPLASTY ANTERIOR APPROACH;  Surgeon: Kathryne Hitch, MD;  Location: [facility];  Service: Orthopedics;  Laterality: Right;   Social History   Tobacco Use   Smoking status: Some Days    Types: Cigars   Smokeless tobacco: Never   Tobacco comments:    Vaping as well  Vaping Use   Vaping status: Never Used  Substance Use Topics   Alcohol use: No    Alcohol/week: 0.0 standard drinks of alcohol    Comment: hx of alcohol use    Drug use: Yes    Types: Marijuana    Comment:  marijuana use   Family History  Problem Relation Age of Onset   Hypertension Mother    Diabetes Mother    Allergies  Allergen Reactions   Benadryl [Diphenhydramine] Hives and Swelling   Chantix [Varenicline] Nausea And Vomiting   Latex Swelling and Rash   Review of Systems  Constitutional:  Negative for chills  and fever.  HENT:  Negative for sore throat.   Respiratory:  Negative for cough and shortness of breath.   Cardiovascular:  Negative for chest pain, palpitations and leg swelling.  Gastrointestinal:  Negative for abdominal pain, blood in stool, constipation, diarrhea, nausea and vomiting.  Genitourinary:  Negative for dysuria and hematuria.  Musculoskeletal:  Negative for myalgias.  Skin:  Negative for itching and rash.  Neurological:  Negative for dizziness and headaches.  Psychiatric/Behavioral:  Negative for depression and suicidal ideas.      Objective:     BP 132/76   Pulse 79   Ht 5\' 7"  (1.702 m)   Wt 166 lb 9.6 oz (75.6 kg)   SpO2 97%   BMI 26.09 kg/m  BP Readings from Last 3 Encounters:  01/21/24  132/76  11/26/23 115/78  10/03/23 (!) 105/56   Physical Exam Vitals reviewed.  Constitutional:      General: He is not in acute distress.    Appearance: Normal appearance. He is not ill-appearing.  HENT:     Head: Normocephalic and atraumatic.     Right Ear: External ear normal.     Left Ear: External ear normal.     Nose: Nose normal. No congestion or rhinorrhea.     Mouth/Throat:     Mouth: Mucous membranes are moist.     Pharynx: Oropharynx is clear.  Eyes:     General: No scleral icterus.    Extraocular Movements: Extraocular movements intact.     Conjunctiva/sclera: Conjunctivae normal.     Pupils: Pupils are equal, round, and reactive to light.  Cardiovascular:     Rate and Rhythm: Normal rate and regular rhythm.     Pulses: Normal pulses.     Heart sounds: Normal heart sounds. No murmur heard. Pulmonary:     Effort: Pulmonary effort is normal.     Breath sounds: Normal breath sounds. No wheezing, rhonchi or rales.  Abdominal:     General: Abdomen is flat. Bowel sounds are normal. There is no distension.     Palpations: Abdomen is soft.     Tenderness: There is no abdominal tenderness.  Musculoskeletal:        General: No swelling or deformity.     Cervical back: Normal range of motion.  Skin:    General: Skin is warm and dry.     Capillary Refill: Capillary refill takes less than 2 seconds.  Neurological:     General: No focal deficit present.     Mental Status: He is alert and oriented to person, place, and time.     Motor: No weakness.     Gait: Gait abnormal (Antalgic gait).  Psychiatric:        Mood and Affect: Mood normal.        Behavior: Behavior normal.        Thought Content: Thought content normal.   Last CBC Lab Results  Component Value Date   WBC 4.6 01/22/2024   HGB 17.0 01/22/2024   HCT 51.4 (H) 01/22/2024   MCV 94 01/22/2024   MCH 31.0 01/22/2024   RDW 13.4 01/22/2024   PLT 194 01/22/2024   Last metabolic panel Lab Results  Component  Value Date   GLUCOSE 128 (H) 01/22/2024   NA 139 01/22/2024   K 4.4 01/22/2024   CL 104 01/22/2024   CO2 21 01/22/2024   BUN 18 01/22/2024   CREATININE 1.22 01/22/2024   GFRNONAA >60 10/01/2023   CALCIUM 9.4 01/22/2024  PROT 6.5 01/22/2024   ALBUMIN 4.2 01/22/2024   LABGLOB 2.3 01/22/2024   AGRATIO 1.6 05/22/2023   BILITOT 0.4 01/22/2024   ALKPHOS 82 01/22/2024   AST 18 01/22/2024   ALT 17 01/22/2024   ANIONGAP 10 10/01/2023   Last lipids Lab Results  Component Value Date   CHOL 144 01/22/2024   HDL 42 01/22/2024   LDLCALC 85 01/22/2024   TRIG 88 01/22/2024   CHOLHDL 3.4 01/22/2024   Last hemoglobin A1c Lab Results  Component Value Date   HGBA1C 6.2 (H) 01/21/2024   Last thyroid functions Lab Results  Component Value Date   TSH 1.559 06/24/2023     Assessment & Plan:   Problem List Items Addressed This Visit       Essential hypertension, benign   BP elevated initially today, improved to 136/72 on repeat.  He is prescribed carvedilol 3.125 mg twice daily and Imdur 60 mg daily.  We discussed resuming low-dose lisinopril in the setting of diabetes mellitus and CKD.  He is in agreement. -Start lisinopril 2.5 mg daily      Type 2 diabetes mellitus (HCC) - Primary (Chronic)   A1c 7.4 on labs from June 2024.  He is currently prescribed Farxiga. -Repeat A1c ordered today. -As otherwise documented, resume low-dose lisinopril in the setting of DM and CKD -Ophthalmology referral placed today for diabetic eye exam      CKD (chronic kidney disease) stage 2, GFR 60-89 ml/min   CKD stage II.  He is prescribed Farxiga.  Previously prescribed lisinopril but it was held in the setting of hypotension.  BP is mildly elevated today.  As otherwise documented, resume low-dose lisinopril.      Tobacco use disorder   He continues to smoke 10 cigars daily and remains precontemplative with regards to cessation.       Return in about 3 months (around 04/20/2024).    Billie Lade, MD

## 2024-01-21 NOTE — Patient Instructions (Signed)
It was a pleasure to see you today.  Thank you for giving [place] the opportunity to be involved in your care.  Below is a brief recap of your visit and next steps.  We will plan to see you again in 3 months.  Summary Resume low-dose lisinopril Check A1c Eye doctor referral placed Follow up in 3 months

## 2024-01-23 ENCOUNTER — Encounter: Payer: Self-pay | Admitting: Internal Medicine

## 2024-01-23 LAB — CBC
Hematocrit: 51.4 % — ABNORMAL HIGH (ref 37.5–51.0)
Hemoglobin: 17 g/dL (ref 13.0–17.7)
MCH: 31 pg (ref 26.6–33.0)
MCHC: 33.1 g/dL (ref 31.5–35.7)
MCV: 94 fL (ref 79–97)
Platelets: 194 10*3/uL (ref 150–450)
RBC: 5.48 x10E6/uL (ref 4.14–5.80)
RDW: 13.4 % (ref 11.6–15.4)
WBC: 4.6 10*3/uL (ref 3.4–10.8)

## 2024-01-23 LAB — COMPREHENSIVE METABOLIC PANEL
ALT: 17 [IU]/L (ref 0–44)
AST: 18 [IU]/L (ref 0–40)
Albumin: 4.2 g/dL (ref 3.8–4.9)
Alkaline Phosphatase: 82 [IU]/L (ref 44–121)
BUN/Creatinine Ratio: 15 (ref 9–20)
BUN: 18 mg/dL (ref 6–24)
Bilirubin Total: 0.4 mg/dL (ref 0.0–1.2)
CO2: 21 mmol/L (ref 20–29)
Calcium: 9.4 mg/dL (ref 8.7–10.2)
Chloride: 104 mmol/L (ref 96–106)
Creatinine, Ser: 1.22 mg/dL (ref 0.76–1.27)
Globulin, Total: 2.3 g/dL (ref 1.5–4.5)
Glucose: 128 mg/dL — ABNORMAL HIGH (ref 70–99)
Potassium: 4.4 mmol/L (ref 3.5–5.2)
Sodium: 139 mmol/L (ref 134–144)
Total Protein: 6.5 g/dL (ref 6.0–8.5)
eGFR: 69 mL/min/{1.73_m2} (ref >59)

## 2024-01-23 LAB — LIPID PANEL
Chol/HDL Ratio: 3.4 {ratio} (ref 0.0–5.0)
Cholesterol, Total: 144 mg/dL (ref 100–199)
HDL: 42 mg/dL (ref >39)
LDL Chol Calc (NIH): 85 mg/dL (ref 0–99)
Triglycerides: 88 mg/dL (ref 0–149)
VLDL Cholesterol Cal: 17 mg/dL (ref 5–40)

## 2024-01-23 LAB — BAYER DCA HB A1C WAIVED: HB A1C (BAYER DCA - WAIVED): 6.2 % — ABNORMAL HIGH (ref 4.8–5.6)

## 2024-01-30 ENCOUNTER — Ambulatory Visit (INDEPENDENT_AMBULATORY_CARE_PROVIDER_SITE_OTHER): Payer: Medicaid Other | Admitting: Acute Care

## 2024-01-30 DIAGNOSIS — Z87891 Personal history of nicotine dependence: Secondary | ICD-10-CM

## 2024-01-30 NOTE — Patient Instructions (Signed)

## 2024-01-30 NOTE — Progress Notes (Signed)
 Provider Attestation I agree with the documentation of the Shared Decision Making visit,  smoking cessation counseling if appropriate, and verification or eligibility for lung cancer screening as documented by the RN Nurse Navigator.   [name], MSN, AGACNP-BC [facility] See Amion for personal pager PCCM on call pager [phone]     Virtual Visit via Telephone Note  I connected with Steve Vang on 01/30/24 at  2:00 PM EST by telephone and verified that I am speaking with the correct person using two identifiers.  Location: Patient: in home Provider: [address] [place], [place], [address]    Shared Decision Making Visit Lung Cancer Screening Program [phone])   Eligibility: Age 58 y.o. Pack Years Smoking History Calculation 42 (# packs/per year x # years smoked) Recent History of coughing up blood  no Unexplained weight loss? no ( >Than 15 pounds within the last 6 months ) Prior History Lung / other cancer no (Diagnosis within the last 5 years already requiring surveillance chest CT Scans). Smoking Status Former Smoker Former Smokers: Years since quit: 5 years  Quit Date: 2020  Visit Components: Discussion included one or more decision making aids. yes Discussion included risk/benefits of screening. yes Discussion included potential follow up diagnostic testing for abnormal scans. yes Discussion included meaning and risk of over diagnosis. yes Discussion included meaning and risk of False Positives. yes Discussion included meaning of total radiation exposure. yes  Counseling Included: Importance of adherence to annual lung cancer LDCT screening. yes Impact of comorbidities on ability to participate in the program. yes Ability and willingness to under diagnostic treatment. yes  Smoking Cessation Counseling: Current Smokers:  Discussed importance of smoking cessation. yes Information about tobacco cessation classes and  interventions provided to patient. yes Patient provided with "ticket" for LDCT Scan. yes Symptomatic Patient. no  Counseling NA Diagnosis Code: Tobacco Use Z72.0 Asymptomatic Patient yes  Counseling (Intermediate counseling: > three minutes counseling) Z5638 Former Smokers:  Discussed the importance of maintaining cigarette abstinence. yes Diagnosis Code: Personal History of Nicotine  Dependence. V56.433 Information about tobacco cessation classes and interventions provided to patient. Yes Patient provided with "ticket" for LDCT Scan. yes Written Order for Lung Cancer Screening with LDCT placed in Epic. Yes (CT Chest Lung Cancer Screening Low Dose W/O CM) [id number] Z12.2-Screening of respiratory organs Z87.891-Personal history of nicotine  dependence   [name], RN 01/30/24

## 2024-02-03 ENCOUNTER — Other Ambulatory Visit: Payer: Self-pay | Admitting: Nurse Practitioner

## 2024-02-03 DIAGNOSIS — E782 Mixed hyperlipidemia: Secondary | ICD-10-CM

## 2024-02-03 DIAGNOSIS — I5032 Chronic diastolic (congestive) heart failure: Secondary | ICD-10-CM

## 2024-02-03 DIAGNOSIS — N182 Chronic kidney disease, stage 2 (mild): Secondary | ICD-10-CM

## 2024-02-03 MED ORDER — EZETIMIBE 10 MG PO TABS: 10.0000 mg | ORAL_TABLET | Freq: Every day | ORAL | 1 refills | Status: DC

## 2024-02-04 ENCOUNTER — Ambulatory Visit (HOSPITAL_COMMUNITY)
Admission: RE | Admit: 2024-02-04 | Discharge: 2024-02-04 | Disposition: A | Discharge status: Home / Self Care | Payer: Medicaid Other | Source: Ambulatory Visit | Attending: Acute Care | Admitting: Acute Care

## 2024-02-04 DIAGNOSIS — Z122 Encounter for screening for malignant neoplasm of respiratory organs: Secondary | ICD-10-CM | POA: Insufficient documentation

## 2024-02-04 DIAGNOSIS — Z87891 Personal history of nicotine dependence: Secondary | ICD-10-CM | POA: Insufficient documentation

## 2024-02-05 ENCOUNTER — Other Ambulatory Visit: Payer: Self-pay | Admitting: Nurse Practitioner

## 2024-02-14 ENCOUNTER — Telehealth: Payer: Self-pay | Admitting: Acute Care

## 2024-02-14 ENCOUNTER — Other Ambulatory Visit: Payer: Self-pay

## 2024-02-14 DIAGNOSIS — Z87891 Personal history of nicotine dependence: Secondary | ICD-10-CM

## 2024-02-14 DIAGNOSIS — Z122 Encounter for screening for malignant neoplasm of respiratory organs: Secondary | ICD-10-CM

## 2024-02-14 NOTE — Telephone Encounter (Signed)
Spoke with wife to review results of recent LDCT.  She states patient had previous stroke and he sometimes has difficulty understanding or remembering information.  She is on dpr.  Emphysema and atherosclerosis noted.  Hepatic steatosis also noted and advised heatlhy diet and exercise and to discuss with PCP for further recommendations.  Also noted were cardiac findings with possible echocardiography recommended.  Patient sees cardiology Dr. Diona Browner and has upcoming appointment in April.  Routed results to cardiology for review.  Wife acknowledged understanding of information and had no questions.  Plan to follow up one year for next LDCT> results/plan routed to PCP. New order placed.

## 2024-02-17 ENCOUNTER — Telehealth: Payer: Self-pay | Admitting: *Deleted

## 2024-02-17 DIAGNOSIS — I255 Ischemic cardiomyopathy: Secondary | ICD-10-CM

## 2024-02-17 NOTE — Telephone Encounter (Signed)
 Patient returned RN's call.

## 2024-02-17 NOTE — Telephone Encounter (Signed)
-----   Message from [name] sent at 02/14/2024  3:18 PM EST ----- Chest CT reviewed.  Patient has a prior history of LV mural thrombus, although this had resolved by his last echocardiogram.  Let's go ahead and get a follow-up echocardiogram for diagnosis ischemic cardiomyopathy prior to his follow-up visit. ----- Message ----- From: Karlton Lemon, RN Sent: 02/14/2024  12:14 PM EST To: Jonelle Sidle, MD  Pt has upcoming appt and requests copy to you for review of cardiac findings.

## 2024-02-17 NOTE — Telephone Encounter (Signed)
 Patient informed and verbalized understanding of plan. Will forward to schedulers to update echo appointment prior to 03/31/2024 visit.

## 2024-03-02 NOTE — Assessment & Plan Note (Addendum)
 A1c 7.4 on labs from June 2024.  He is currently prescribed Farxiga. -Repeat A1c ordered today. -As otherwise documented, resume low-dose lisinopril in the setting of DM and CKD -Ophthalmology referral placed today for diabetic eye exam

## 2024-03-02 NOTE — Assessment & Plan Note (Signed)
 He continues to smoke 10 cigars daily and remains precontemplative with regards to cessation.

## 2024-03-02 NOTE — Assessment & Plan Note (Signed)
 CKD stage II.  He is prescribed Farxiga.  Previously prescribed lisinopril but it was held in the setting of hypotension.  BP is mildly elevated today.  As otherwise documented, resume low-dose lisinopril.

## 2024-03-03 ENCOUNTER — Ambulatory Visit (HOSPITAL_COMMUNITY): Payer: Medicaid Other

## 2024-03-10 NOTE — Addendum Note (Signed)
 Addended by: Eustace Moore on: 03/10/2024 08:09 AM   Modules accepted: Orders

## 2024-03-27 ENCOUNTER — Other Ambulatory Visit: Payer: Self-pay | Admitting: Internal Medicine

## 2024-03-27 ENCOUNTER — Telehealth: Payer: Self-pay | Admitting: Internal Medicine

## 2024-03-27 MED ORDER — BLOOD GLUCOSE METER KIT: PACK | 0 refills | Status: AC

## 2024-03-27 MED ORDER — BLOOD GLUCOSE METER KIT: PACK | 0 refills | Status: DC

## 2024-03-27 NOTE — Telephone Encounter (Signed)
 Patient needing another glucose monitor along with the strips says he was told by Dr Durwin Nora if he couldn't find it another one could be sent in for him. [facility] in [place]. Thanks

## 2024-03-30 ENCOUNTER — Ambulatory Visit: Payer: Medicaid Other | Attending: Cardiology

## 2024-03-30 DIAGNOSIS — I255 Ischemic cardiomyopathy: Secondary | ICD-10-CM | POA: Diagnosis not present

## 2024-03-30 LAB — ECHOCARDIOGRAM COMPLETE
AR max vel: 3.01 cm2
AV Area VTI: 2.89 cm2
AV Area mean vel: 2.92 cm2
AV Mean grad: 3 mmHg
AV Peak grad: 5 mmHg
Ao pk vel: 1.12 m/s
Area-P 1/2: 4.6 cm2
MV VTI: 2.15 cm2
S' Lateral: 4.1 cm

## 2024-03-30 MED ORDER — PERFLUTREN LIPID MICROSPHERE
1.0000 mL | INTRAVENOUS | Status: AC | PRN
  Administered 2024-03-30: 7 mL via INTRAVENOUS

## 2024-03-30 NOTE — Telephone Encounter (Signed)
 Supplies and meter rx sent on 03/27/2024

## 2024-03-31 ENCOUNTER — Other Ambulatory Visit: Payer: Self-pay | Admitting: Cardiology

## 2024-03-31 ENCOUNTER — Encounter: Payer: Self-pay | Admitting: Cardiology

## 2024-03-31 ENCOUNTER — Telehealth: Payer: Self-pay | Admitting: Cardiology

## 2024-03-31 ENCOUNTER — Ambulatory Visit: Payer: Medicaid Other | Attending: Cardiology | Admitting: Cardiology

## 2024-03-31 VITALS — BP 110/70 | HR 64 | Ht 67.0 in | Wt 164.4 lb

## 2024-03-31 DIAGNOSIS — I5022 Chronic systolic (congestive) heart failure: Secondary | ICD-10-CM | POA: Diagnosis not present

## 2024-03-31 DIAGNOSIS — E782 Mixed hyperlipidemia: Secondary | ICD-10-CM

## 2024-03-31 DIAGNOSIS — I200 Unstable angina: Secondary | ICD-10-CM

## 2024-03-31 DIAGNOSIS — I2510 Atherosclerotic heart disease of native coronary artery without angina pectoris: Secondary | ICD-10-CM

## 2024-03-31 DIAGNOSIS — I25119 Atherosclerotic heart disease of native coronary artery with unspecified angina pectoris: Secondary | ICD-10-CM | POA: Diagnosis not present

## 2024-03-31 DIAGNOSIS — Z01810 Encounter for preprocedural cardiovascular examination: Secondary | ICD-10-CM | POA: Diagnosis not present

## 2024-03-31 NOTE — H&P (View-Only) (Signed)
 Cardiology Office Note  Date: 03/31/2024   ID: [name], DOB [birth year], MRN [id number]  History of Present Illness: Steve Vang is a 58 y.o. male last seen in November 2024 by Ms. Philis Nettle NP, I reviewed the note.  He is here with his wife for a follow-up visit.  He reports increasing angina and nitroglycerin use over the last several weeks both with activity, and sometimes when he gets up in the morning.  He reports less stamina.  No palpitations or syncope.  We went over his medications today.  He reports compliance with therapy, tolerated the addition of Zetia made back in January.  I reviewed his recent echocardiogram as noted below.  Findings are relatively stable, LVEF 45 to 50% and mild RV dysfunction.  We discussed his symptoms today, further titration of medical regimen is fairly limited based on his present vital signs.  We reviewed the results of his Myoview from October of last year and discussed proceeding with a diagnostic cardiac catheterization to assess native coronary and bypass graft anatomy for potential revascularization options.  ECG today which shows normal sinus rhythm with LVH, old anterolateral infarct pattern and repolarization abnormalities.  Physical Exam: VS:  BP 110/70   Pulse 64   Ht 5\' 7"  (1.702 m)   Wt 164 lb 6.4 oz (74.6 kg)   SpO2 96%   BMI 25.75 kg/m , BMI Body mass index is 25.75 kg/m.  Wt Readings from Last 3 Encounters:  03/31/24 164 lb 6.4 oz (74.6 kg)  01/21/24 166 lb 9.6 oz (75.6 kg)  11/26/23 166 lb (75.3 kg)    General: Patient appears comfortable at rest. HEENT: Conjunctiva and lids normal. Neck: Supple, no elevated JVP or carotid bruits. Lungs: Clear to auscultation, nonlabored breathing at rest. Cardiac: Regular rate and rhythm, no S3 or significant systolic murmur. Abdomen: Soft, nontender, bowel sounds present. Extremities: No pitting edema, distal pulses 2+. Skin: Warm and dry. Musculoskeletal: No  kyphosis. Neuropsychiatric: Alert and oriented x3, affect grossly appropriate.  ECG:  An ECG dated 06/24/2023 was personally reviewed today and demonstrated:  Sinus rhythm with LVH, prior anterolateral infarct pattern and repolarization abnormalities.  Labwork: 06/24/2023: B Natriuretic Peptide 72.0; TSH 1.559 07/01/2023: Magnesium 2.1 01/22/2024: ALT 17; AST 18; BUN 18; Creatinine, Ser 1.22; Hemoglobin 17.0; Platelets 194; Potassium 4.4; Sodium 139     Component Value Date/Time   CHOL 144 01/22/2024 0803   TRIG 88 01/22/2024 0803   HDL 42 01/22/2024 0803   CHOLHDL 3.4 01/22/2024 0803   CHOLHDL 4.2 06/25/2023 0418   VLDL 30 06/25/2023 0418   LDLCALC 85 01/22/2024 0803   LDLCALC 131 (H) 06/05/2019 0803   Other Studies Reviewed Today:  Echocardiogram 03/30/2024:  1. Left ventricular ejection fraction, by estimation, is 45 to 50%. The  left ventricle has mildly decreased function. The left ventricle  demonstrates regional wall motion abnormalities (see scoring  diagram/findings for description). Left ventricular  diastolic parameters are consistent with Grade II diastolic dysfunction  (pseudonormalization).   2. Right ventricular systolic function is mildly reduced. The right  ventricular size is normal. Tricuspid regurgitation signal is inadequate  for assessing PA pressure.   3. The mitral valve is grossly normal. Trivial mitral valve  regurgitation.   4. The aortic valve is tricuspid. Aortic valve regurgitation is not  visualized. Aortic valve mean gradient measures 3.0 mmHg.   5. The inferior vena cava is normal in size with greater than 50%  respiratory variability, suggesting right atrial  pressure of 3 mmHg.   Assessment and Plan:  1.  Multivessel CAD status post CABG in 2006 with LIMA to first diagonal, SVG to PLB, SVG to RVE branch of nondominant RCA.  Follow-up Lexiscan Myoview in May 2024 showed a large apical infarct scar with no active ischemia.  Despite adjustments in  medical therapy and well-controlled vital signs, he continues to have increasing angina and lack of stamina.  We discussed the risks and benefits of a diagnostic cardiac catheterization to assess native coronary and bypass graft anatomy for potential revascularization options.  He is in agreement to proceed.  For now continue aspirin 81 mg daily, Farxiga 10 mg daily, Imdur 60 mg daily, Ranexa 500 mg twice daily, Crestor 40 mg daily, and Zetia 10 mg daily.   2.  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  Continue Coreg 6.25 mg twice daily, Farxiga 10 mg daily, and lisinopril 2.5 mg daily.  Currently not on diuretic.   3.  History of LV mural thrombus, resolved and no longer on Coumadin.   4.  Primary hypertension.  Pressure well-controlled.  No changes made to present regimen.   5.  Mixed hyperlipidemia.  LDL 85 in January.  Continue Crestor 40 mg daily, Zetia 10 mg daily was added in January.  Plan to repeat lipid panel down the road.  Disposition:  Follow up  after procedure.  Signed, Jonelle Sidle, M.D., F.A.C.C. [facility]

## 2024-03-31 NOTE — Progress Notes (Signed)
 Cardiology Office Note  Date: 03/31/2024   ID: Steve Vang, DOB [birth year], MRN [id number]  History of Present Illness: Steve Vang is a 58 y.o. male last seen in November 2024 by Ms. Philis Nettle NP, I reviewed the note.  He is here with his wife for a follow-up visit.  He reports increasing angina and nitroglycerin use over the last several weeks both with activity, and sometimes when he gets up in the morning.  He reports less stamina.  No palpitations or syncope.  We went over his medications today.  He reports compliance with therapy, tolerated the addition of Zetia made back in January.  I reviewed his recent echocardiogram as noted below.  Findings are relatively stable, LVEF 45 to 50% and mild RV dysfunction.  We discussed his symptoms today, further titration of medical regimen is fairly limited based on his present vital signs.  We reviewed the results of his Myoview from October of last year and discussed proceeding with a diagnostic cardiac catheterization to assess native coronary and bypass graft anatomy for potential revascularization options.  ECG today which shows normal sinus rhythm with LVH, old anterolateral infarct pattern and repolarization abnormalities.  Physical Exam: VS:  BP 110/70   Pulse 64   Ht 5\' 7"  (1.702 m)   Wt 164 lb 6.4 oz (74.6 kg)   SpO2 96%   BMI 25.75 kg/m , BMI Body mass index is 25.75 kg/m.  Wt Readings from Last 3 Encounters:  03/31/24 164 lb 6.4 oz (74.6 kg)  01/21/24 166 lb 9.6 oz (75.6 kg)  11/26/23 166 lb (75.3 kg)    General: Patient appears comfortable at rest. HEENT: Conjunctiva and lids normal. Neck: Supple, no elevated JVP or carotid bruits. Lungs: Clear to auscultation, nonlabored breathing at rest. Cardiac: Regular rate and rhythm, no S3 or significant systolic murmur. Abdomen: Soft, nontender, bowel sounds present. Extremities: No pitting edema, distal pulses 2+. Skin: Warm and dry. Musculoskeletal: No  kyphosis. Neuropsychiatric: Alert and oriented x3, affect grossly appropriate.  ECG:  An ECG dated 06/24/2023 was personally reviewed today and demonstrated:  Sinus rhythm with LVH, prior anterolateral infarct pattern and repolarization abnormalities.  Labwork: 06/24/2023: B Natriuretic Peptide 72.0; TSH 1.559 07/01/2023: Magnesium 2.1 01/22/2024: ALT 17; AST 18; BUN 18; Creatinine, Ser 1.22; Hemoglobin 17.0; Platelets 194; Potassium 4.4; Sodium 139     Component Value Date/Time   CHOL 144 01/22/2024 0803   TRIG 88 01/22/2024 0803   HDL 42 01/22/2024 0803   CHOLHDL 3.4 01/22/2024 0803   CHOLHDL 4.2 06/25/2023 0418   VLDL 30 06/25/2023 0418   LDLCALC 85 01/22/2024 0803   LDLCALC 131 (H) 06/05/2019 0803   Other Studies Reviewed Today:  Echocardiogram 03/30/2024:  1. Left ventricular ejection fraction, by estimation, is 45 to 50%. The  left ventricle has mildly decreased function. The left ventricle  demonstrates regional wall motion abnormalities (see scoring  diagram/findings for description). Left ventricular  diastolic parameters are consistent with Grade II diastolic dysfunction  (pseudonormalization).   2. Right ventricular systolic function is mildly reduced. The right  ventricular size is normal. Tricuspid regurgitation signal is inadequate  for assessing PA pressure.   3. The mitral valve is grossly normal. Trivial mitral valve  regurgitation.   4. The aortic valve is tricuspid. Aortic valve regurgitation is not  visualized. Aortic valve mean gradient measures 3.0 mmHg.   5. The inferior vena cava is normal in size with greater than 50%  respiratory variability, suggesting right atrial  pressure of 3 mmHg.   Assessment and Plan:  1.  Multivessel CAD status post CABG in 2006 with LIMA to first diagonal, SVG to PLB, SVG to RVE branch of nondominant RCA.  Follow-up Lexiscan Myoview in May 2024 showed a large apical infarct scar with no active ischemia.  Despite adjustments in  medical therapy and well-controlled vital signs, he continues to have increasing angina and lack of stamina.  We discussed the risks and benefits of a diagnostic cardiac catheterization to assess native coronary and bypass graft anatomy for potential revascularization options.  He is in agreement to proceed.  For now continue aspirin 81 mg daily, Farxiga 10 mg daily, Imdur 60 mg daily, Ranexa 500 mg twice daily, Crestor 40 mg daily, and Zetia 10 mg daily.   2.  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  Continue Coreg 6.25 mg twice daily, Farxiga 10 mg daily, and lisinopril 2.5 mg daily.  Currently not on diuretic.   3.  History of LV mural thrombus, resolved and no longer on Coumadin.   4.  Primary hypertension.  Pressure well-controlled.  No changes made to present regimen.   5.  Mixed hyperlipidemia.  LDL 85 in January.  Continue Crestor 40 mg daily, Zetia 10 mg daily was added in January.  Plan to repeat lipid panel down the road.  Disposition:  Follow up  after procedure.  Signed, Jonelle Sidle, M.D., F.A.C.C. [facility]

## 2024-03-31 NOTE — Patient Instructions (Addendum)
 Medication Instructions:  Your physician recommends that you continue on your current medications as directed. Please refer to the Current Medication list given to you today.  Labwork: BMET & CBC today at [facility] ([address] [place]. [place]) Non-fasting Pre-cath lab work  Testing/Procedures: Your physician has requested that you have a cardiac catheterization. Cardiac catheterization is used to diagnose and/or treat various heart conditions. Doctors may recommend this procedure for a number of different reasons. The most common reason is to evaluate chest pain. Chest pain can be a symptom of coronary artery disease (CAD), and cardiac catheterization can show whether plaque is narrowing or blocking your heart's arteries. This procedure is also used to evaluate the valves, as well as measure the blood flow and oxygen levels in different parts of your heart. For further information please visit [email or web address] Please follow instruction sheet, as given.  Follow-Up: Your physician recommends that you schedule a follow-up appointment in: 1 month  Any Other Special Instructions Will Be Listed Below (If Applicable).  If you need a refill on your cardiac medications before your next appointment, please call your pharmacy.   [facility] A DEPT OF [facility] [address] [place] [address] Dept: [phone] Loc: [phone]  WYN NETTLE  03/31/2024  You are scheduled for a Cardiac Catheterization on Monday, April 7 with Dr. Peter Swaziland.  1. Please arrive at the [facility] (Main Entrance A) at [facility]: [address] [place], [place] [address] at 8:30 AM (This time is 2 hour(s) before your procedure to ensure your preparation).   Free valet parking service is available. You will check in at ADMITTING. The support person will be asked to wait in the waiting room.  It is OK to have someone drop you off and  come back when you are ready to be discharged.    Special note: Every effort is made to have your procedure done on time. Please understand that emergencies sometimes delay scheduled procedures.  2. Diet: Do not eat solid foods after midnight.  The patient may have clear liquids until 5am upon the day of the procedure.  3. Labs: You will need to have blood drawn on Tuesday, April 1 at [facility] ([address] [place], [place]). You do not need to be fasting.  4. Medication instructions in preparation for your procedure: hold farxiga morning of cath   Contrast Allergy: No  On the morning of your procedure, take your Aspirin 81 mg and any morning medicines NOT listed above.  You may use sips of water.  5. Plan to go home the same day, you will only stay overnight if medically necessary. 6. Bring a current list of your medications and current insurance cards. 7. You MUST have a responsible person to drive you home. 8. Someone MUST be with you the first 24 hours after you arrive home or your discharge will be delayed. 9. Please wear clothes that are easy to get on and off and wear slip-on shoes.  Thank you for allowing [place] to care for you!   -- [facility]

## 2024-03-31 NOTE — Telephone Encounter (Signed)
 Checking percert on the following    Left Heart Cath dx: accelerating angina 04/06/2024 @10 :30 am with Dr. Swaziland

## 2024-04-01 ENCOUNTER — Telehealth: Payer: Self-pay | Admitting: *Deleted

## 2024-04-01 LAB — BASIC METABOLIC PANEL WITH GFR
BUN/Creatinine Ratio: 16 (ref 9–20)
BUN: 23 mg/dL (ref 6–24)
CO2: 24 mmol/L (ref 20–29)
Calcium: 9.8 mg/dL (ref 8.7–10.2)
Chloride: 100 mmol/L (ref 96–106)
Creatinine, Ser: 1.46 mg/dL — ABNORMAL HIGH (ref 0.76–1.27)
Glucose: 134 mg/dL — ABNORMAL HIGH (ref 70–99)
Potassium: 4.9 mmol/L (ref 3.5–5.2)
Sodium: 139 mmol/L (ref 134–144)
eGFR: 55 mL/min/{1.73_m2} — ABNORMAL LOW (ref >59)

## 2024-04-01 LAB — CBC
Hematocrit: 54.9 % — ABNORMAL HIGH (ref 37.5–51.0)
Hemoglobin: 18.6 g/dL — ABNORMAL HIGH (ref 13.0–17.7)
MCH: 32 pg (ref 26.6–33.0)
MCHC: 33.9 g/dL (ref 31.5–35.7)
MCV: 95 fL (ref 79–97)
Platelets: 197 10*3/uL (ref 150–450)
RBC: 5.81 x10E6/uL — ABNORMAL HIGH (ref 4.14–5.80)
RDW: 13.3 % (ref 11.6–15.4)
WBC: 6.9 10*3/uL (ref 3.4–10.8)

## 2024-04-01 NOTE — Telephone Encounter (Addendum)
 Cardiac Catheterization scheduled at [facility] for: Monday April 06, 2024 10:30 AM Arrival time [facility] Main Entrance A at: 8:30 AM  Nothing to eat after midnight prior to procedure, clear liquids until 5 AM day of procedure.  Medication instructions: -Hold:  Farxiga-AM of procedure  Lisinopril-day before and day of procedure-per protocol GFR < 60 (55). -Other usual morning medications can be taken with sips of water including aspirin 81 mg.  Plan to go home the same day, you will only stay overnight if medically necessary.  You must have responsible adult to drive you home.  Someone must be with you the first 24 hours after you arrive home.  Reviewed procedure instructions with patient.

## 2024-04-02 NOTE — Telephone Encounter (Signed)
 Yes, that is correct. Thanks

## 2024-04-06 ENCOUNTER — Encounter (HOSPITAL_COMMUNITY)
Admission: RE | Disposition: A | Discharge status: Home / Self Care | Payer: Self-pay | Source: Home / Self Care | Attending: Cardiology

## 2024-04-06 ENCOUNTER — Encounter (HOSPITAL_COMMUNITY): Payer: Self-pay | Admitting: Cardiology

## 2024-04-06 ENCOUNTER — Other Ambulatory Visit (HOSPITAL_COMMUNITY): Payer: Self-pay | Admitting: Emergency Medicine

## 2024-04-06 ENCOUNTER — Ambulatory Visit (HOSPITAL_COMMUNITY)
Admission: RE | Admit: 2024-04-06 | Discharge: 2024-04-06 | Disposition: A | Discharge status: Home / Self Care | Attending: Cardiology | Admitting: Cardiology

## 2024-04-06 ENCOUNTER — Other Ambulatory Visit: Payer: Self-pay

## 2024-04-06 DIAGNOSIS — I252 Old myocardial infarction: Secondary | ICD-10-CM | POA: Diagnosis not present

## 2024-04-06 DIAGNOSIS — I25700 Atherosclerosis of coronary artery bypass graft(s), unspecified, with unstable angina pectoris: Secondary | ICD-10-CM | POA: Diagnosis present

## 2024-04-06 DIAGNOSIS — R0789 Other chest pain: Secondary | ICD-10-CM

## 2024-04-06 DIAGNOSIS — I255 Ischemic cardiomyopathy: Secondary | ICD-10-CM | POA: Diagnosis not present

## 2024-04-06 DIAGNOSIS — I2584 Coronary atherosclerosis due to calcified coronary lesion: Secondary | ICD-10-CM | POA: Insufficient documentation

## 2024-04-06 DIAGNOSIS — E782 Mixed hyperlipidemia: Secondary | ICD-10-CM | POA: Diagnosis not present

## 2024-04-06 DIAGNOSIS — I200 Unstable angina: Secondary | ICD-10-CM

## 2024-04-06 DIAGNOSIS — I2582 Chronic total occlusion of coronary artery: Secondary | ICD-10-CM | POA: Insufficient documentation

## 2024-04-06 DIAGNOSIS — Z79899 Other long term (current) drug therapy: Secondary | ICD-10-CM | POA: Diagnosis not present

## 2024-04-06 DIAGNOSIS — I110 Hypertensive heart disease with heart failure: Secondary | ICD-10-CM | POA: Diagnosis not present

## 2024-04-06 DIAGNOSIS — Z7982 Long term (current) use of aspirin: Secondary | ICD-10-CM | POA: Diagnosis not present

## 2024-04-06 HISTORY — PX: LEFT HEART CATH AND CORS/GRAFTS ANGIOGRAPHY: CATH118250

## 2024-04-06 LAB — GLUCOSE, CAPILLARY
Glucose-Capillary: 173 mg/dL — ABNORMAL HIGH (ref 70–99)
Glucose-Capillary: 113 mg/dL — ABNORMAL HIGH (ref 70–99)

## 2024-04-06 SURGERY — LEFT HEART CATH AND CORS/GRAFTS ANGIOGRAPHY
Anesthesia: LOCAL

## 2024-04-06 MED ORDER — ONDANSETRON HCL 4 MG/2ML IJ SOLN: 4.0000 mg | Freq: Four times a day (QID) | INTRAMUSCULAR | Status: DC | PRN

## 2024-04-06 MED ORDER — VERAPAMIL HCL 2.5 MG/ML IV SOLN
INTRAVENOUS | Status: DC | PRN
  Administered 2024-04-06: 10 mL via INTRA_ARTERIAL

## 2024-04-06 MED ORDER — HEPARIN SODIUM (PORCINE) 1000 UNIT/ML IJ SOLN
INTRAMUSCULAR | Status: DC | PRN
  Administered 2024-04-06: 3500 [IU] via INTRAVENOUS

## 2024-04-06 MED ORDER — MIDAZOLAM HCL 2 MG/2ML IJ SOLN
INTRAMUSCULAR | Status: DC | PRN
  Administered 2024-04-06: 1 mg via INTRAVENOUS

## 2024-04-06 MED ORDER — LIDOCAINE HCL (PF) 1 % IJ SOLN
INTRAMUSCULAR | Status: AC
Start: 2024-04-06 — End: ?
  Filled 2024-04-06: qty 30

## 2024-04-06 MED ORDER — ASPIRIN 81 MG PO CHEW: 81.0000 mg | CHEWABLE_TABLET | ORAL | Status: DC

## 2024-04-06 MED ORDER — IOHEXOL 350 MG/ML SOLN
INTRAVENOUS | Status: DC | PRN
  Administered 2024-04-06: 85 mL

## 2024-04-06 MED ORDER — MIDAZOLAM HCL 2 MG/2ML IJ SOLN
INTRAMUSCULAR | Status: AC
Start: 2024-04-06 — End: ?
  Filled 2024-04-06: qty 2

## 2024-04-06 MED ORDER — VERAPAMIL HCL 2.5 MG/ML IV SOLN
INTRAVENOUS | Status: AC
  Filled 2024-04-06: qty 2

## 2024-04-06 MED ORDER — HEPARIN SODIUM (PORCINE) 1000 UNIT/ML IJ SOLN
INTRAMUSCULAR | Status: AC
  Filled 2024-04-06: qty 10

## 2024-04-06 MED ORDER — SODIUM CHLORIDE 0.9 % WEIGHT BASED INFUSION
3.0000 mL/kg/h | INTRAVENOUS | Status: AC
  Administered 2024-04-06: 3 mL/kg/h via INTRAVENOUS

## 2024-04-06 MED ORDER — SODIUM CHLORIDE 0.9% FLUSH: 3.0000 mL | Freq: Two times a day (BID) | INTRAVENOUS | Status: DC

## 2024-04-06 MED ORDER — SODIUM CHLORIDE 0.9 % IV SOLN: 250.0000 mL | INTRAVENOUS | Status: DC | PRN

## 2024-04-06 MED ORDER — ACETAMINOPHEN 325 MG PO TABS: 650.0000 mg | ORAL_TABLET | ORAL | Status: DC | PRN

## 2024-04-06 MED ORDER — SODIUM CHLORIDE 0.9 % WEIGHT BASED INFUSION: 1.0000 mL/kg/h | INTRAVENOUS | Status: DC

## 2024-04-06 MED ORDER — FENTANYL CITRATE (PF) 100 MCG/2ML IJ SOLN
INTRAMUSCULAR | Status: AC
  Filled 2024-04-06: qty 2

## 2024-04-06 MED ORDER — HEPARIN (PORCINE) IN NACL 1000-0.9 UT/500ML-% IV SOLN
INTRAVENOUS | Status: DC | PRN
  Administered 2024-04-06: 1000 mL

## 2024-04-06 MED ORDER — SODIUM CHLORIDE 0.9% FLUSH: 3.0000 mL | INTRAVENOUS | Status: DC | PRN

## 2024-04-06 MED ORDER — LIDOCAINE HCL (PF) 1 % IJ SOLN
INTRAMUSCULAR | Status: DC | PRN
  Administered 2024-04-06: 5 mL

## 2024-04-06 MED ORDER — FENTANYL CITRATE (PF) 100 MCG/2ML IJ SOLN
INTRAMUSCULAR | Status: DC | PRN
  Administered 2024-04-06: 25 ug via INTRAVENOUS

## 2024-04-06 SURGICAL SUPPLY — 10 items
CATH INFINITI 5 FR IM (CATHETERS) IMPLANT
CATH INFINITI 5FR AL1 (CATHETERS) IMPLANT
CATH INFINITI 5FR MULTPACK ANG (CATHETERS) IMPLANT
DEVICE RAD COMP TR BAND LRG (VASCULAR PRODUCTS) IMPLANT
GLIDESHEATH SLEND SS 6F .021 (SHEATH) IMPLANT
GUIDEWIRE INQWIRE 1.5J.035X260 (WIRE) IMPLANT
INQWIRE 1.5J .035X260CM (WIRE) ×1 IMPLANT
PACK CARDIAC CATHETERIZATION (CUSTOM PROCEDURE TRAY) ×1 IMPLANT
SET ATX-X65L (MISCELLANEOUS) IMPLANT
SHEATH PROBE COVER 6X72 (BAG) IMPLANT

## 2024-04-06 NOTE — Interval H&P Note (Signed)
 History and Physical Interval Note:  04/06/2024 10:02 AM  Steve Vang  has presented today for surgery, with the diagnosis of angiina.  The various methods of treatment have been discussed with the patient and family. After consideration of risks, benefits and other options for treatment, the patient has consented to  Procedure(s): LEFT HEART CATH AND CORS/GRAFTS ANGIOGRAPHY (N/A) as a surgical intervention.  The patient's history has been reviewed, patient examined, no change in status, stable for surgery.  I have reviewed the patient's chart and labs.  Questions were answered to the patient's satisfaction.   Cath Lab Visit (complete for each Cath Lab visit)  Clinical Evaluation Leading to the Procedure:   ACS: Yes.    Non-ACS:    Anginal Classification: CCS III  Anti-ischemic medical therapy: Maximal Therapy (2 or more classes of medications)  Non-Invasive Test Results: No non-invasive testing performed  Prior CABG: Previous CABG        Steve Vang [facility] 04/06/2024 10:02 AM

## 2024-04-06 NOTE — Discharge Instructions (Signed)

## 2024-04-13 ENCOUNTER — Other Ambulatory Visit: Payer: Self-pay | Admitting: Internal Medicine

## 2024-04-13 DIAGNOSIS — M25551 Pain in right hip: Secondary | ICD-10-CM

## 2024-04-15 ENCOUNTER — Telehealth (HOSPITAL_COMMUNITY): Payer: Self-pay | Admitting: *Deleted

## 2024-04-15 NOTE — Telephone Encounter (Signed)
 Reaching out to patient to offer assistance regarding upcoming cardiac imaging study; spoke to wife (ok per DPR) verbalizes understanding of appt date/time, parking situation and where to check in, pre-test NPO status and medications ordered, and verified current allergies; name and call back number provided for further questions should they arise Johney Frame RN Navigator Cardiac Imaging [facility] [phone] office [phone] cell

## 2024-04-16 ENCOUNTER — Ambulatory Visit (HOSPITAL_COMMUNITY)
Admission: RE | Admit: 2024-04-16 | Discharge: 2024-04-16 | Disposition: A | Discharge status: Home / Self Care | Source: Ambulatory Visit | Attending: Cardiology | Admitting: Cardiology

## 2024-04-16 DIAGNOSIS — I2510 Atherosclerotic heart disease of native coronary artery without angina pectoris: Secondary | ICD-10-CM | POA: Diagnosis not present

## 2024-04-16 DIAGNOSIS — R0789 Other chest pain: Secondary | ICD-10-CM | POA: Diagnosis present

## 2024-04-16 MED ORDER — NITROGLYCERIN 0.4 MG SL SUBL
0.8000 mg | SUBLINGUAL_TABLET | Freq: Once | SUBLINGUAL | Status: AC
  Administered 2024-04-16: 0.8 mg via SUBLINGUAL

## 2024-04-16 MED ORDER — IOHEXOL 350 MG/ML SOLN
95.0000 mL | Freq: Once | INTRAVENOUS | Status: AC | PRN
  Administered 2024-04-16: 95 mL via INTRAVENOUS

## 2024-04-16 MED ORDER — DILTIAZEM HCL 25 MG/5ML IV SOLN: 10.0000 mg | INTRAVENOUS | Status: DC | PRN

## 2024-04-16 MED ORDER — NITROGLYCERIN 0.4 MG SL SUBL
SUBLINGUAL_TABLET | SUBLINGUAL | Status: AC
  Filled 2024-04-16: qty 2

## 2024-04-16 MED ORDER — NITROGLYCERIN 0.4 MG SL SUBL
0.8000 mg | SUBLINGUAL_TABLET | Freq: Once | SUBLINGUAL | Status: DC
Start: 2024-04-16 — End: 2024-04-17

## 2024-04-16 MED ORDER — METOPROLOL TARTRATE 5 MG/5ML IV SOLN
INTRAVENOUS | Status: AC
  Filled 2024-04-16: qty 10

## 2024-04-16 MED ORDER — METOPROLOL TARTRATE 5 MG/5ML IV SOLN
10.0000 mg | Freq: Once | INTRAVENOUS | Status: DC | PRN
Start: 2024-04-16 — End: 2024-04-16

## 2024-04-16 MED ORDER — METOPROLOL TARTRATE 5 MG/5ML IV SOLN: 10.0000 mg | INTRAVENOUS | Status: DC | PRN

## 2024-05-05 ENCOUNTER — Other Ambulatory Visit: Payer: Self-pay | Admitting: Cardiology

## 2024-05-05 MED ORDER — RANOLAZINE ER 500 MG PO TB12
500.0000 mg | ORAL_TABLET | Freq: Two times a day (BID) | ORAL | 3 refills | Status: DC
Start: 2024-05-05 — End: 2024-09-07

## 2024-05-11 ENCOUNTER — Encounter: Payer: Self-pay | Admitting: Cardiology

## 2024-05-11 ENCOUNTER — Ambulatory Visit: Attending: Cardiology | Admitting: Cardiology

## 2024-05-11 VITALS — BP 138/82 | HR 69 | Ht 67.0 in | Wt 162.2 lb

## 2024-05-11 DIAGNOSIS — I5022 Chronic systolic (congestive) heart failure: Secondary | ICD-10-CM | POA: Insufficient documentation

## 2024-05-11 DIAGNOSIS — E782 Mixed hyperlipidemia: Secondary | ICD-10-CM | POA: Insufficient documentation

## 2024-05-11 DIAGNOSIS — I25119 Atherosclerotic heart disease of native coronary artery with unspecified angina pectoris: Secondary | ICD-10-CM | POA: Insufficient documentation

## 2024-05-11 MED ORDER — ISOSORBIDE MONONITRATE ER 30 MG PO TB24: 30.0000 mg | ORAL_TABLET | Freq: Every evening | ORAL | 4 refills | Status: DC

## 2024-05-11 MED ORDER — ISOSORBIDE MONONITRATE ER 60 MG PO TB24: 60.0000 mg | ORAL_TABLET | Freq: Every morning | ORAL | 4 refills | Status: DC

## 2024-05-11 NOTE — Patient Instructions (Addendum)
 Medication Instructions:  Your physician has recommended you make the following change in your medication:  Take imdur  60 mg in the morning & 30  mg in the evening Continue all other medications as prescribed  Labwork: none  Testing/Procedures: none  Follow-Up: Your physician recommends that you schedule a follow-up appointment in: 3 months  Any Other Special Instructions Will Be Listed Below (If Applicable).  If you need a refill on your cardiac medications before your next appointment, please call your pharmacy.

## 2024-05-11 NOTE — Progress Notes (Signed)
 Cardiology Office Note  Date: 05/11/2024   ID: EMILE MENTION, DOB 1966/08/06, MRN [id number]  History of Present Illness: Steve Vang is a 58 y.o. male last seen in April.  He is here for a routine visit with his wife.  I reviewed the results of his interval cardiac workup including cardiac catheterization and cardiac CT. He still reports intermittent angina and nitroglycerin  use.  We discussed rationale for trying to further advance medical therapy as best tolerated.  Revascularization options do not look to be favorable at this point based on his workup.  We went over his medications.  We discussed increasing Imdur  to 60 mg in the morning and 30 mg in the evening.  He reports compliance with the remainder of his regimen.  His wife helps him with his medications.  Physical Exam: VS:  BP 138/82   Pulse 69   Ht 5\' 7"  (1.702 m)   Wt 162 lb 3.2 oz (73.6 kg)   SpO2 95%   BMI 25.40 kg/m , BMI Body mass index is 25.4 kg/m.  Wt Readings from Last 3 Encounters:  05/11/24 162 lb 3.2 oz (73.6 kg)  04/06/24 164 lb (74.4 kg)  03/31/24 164 lb 6.4 oz (74.6 kg)    General: Patient appears comfortable at rest. HEENT: Conjunctiva and lids normal. Neck: Supple, no elevated JVP or carotid bruits. Lungs: Clear to auscultation, nonlabored breathing at rest. Cardiac: Regular rate and rhythm, no S3, 1/6 systolic murmur.  ECG:  An ECG dated 03/31/2024 was personally reviewed today and demonstrated:  Sinus rhythm with LVH and chronic anterolateral ST-T wave abnormalities.  Labwork: 06/24/2023: B Natriuretic Peptide 72.0; TSH 1.559 07/01/2023: Magnesium 2.1 01/22/2024: ALT 17; AST 18 03/31/2024: BUN 23; Creatinine, Ser 1.46; Hemoglobin 18.6; Platelets 197; Potassium 4.9; Sodium 139     Component Value Date/Time   CHOL 144 01/22/2024 0803   TRIG 88 01/22/2024 0803   HDL 42 01/22/2024 0803   CHOLHDL 3.4 01/22/2024 0803   CHOLHDL 4.2 06/25/2023 0418   VLDL 30 06/25/2023 0418   LDLCALC 85 01/22/2024  0803   LDLCALC 131 (H) 06/05/2019 0803   Other Studies Reviewed Today:  No interval cardiac testing for review today.  Assessment and Plan:  1.  Multivessel CAD status post CABG in 2006 with LIMA to first diagonal, SVG to PLB, SVG to RVE branch of nondominant RCA.  Follow-up Lexiscan  Myoview  in May 2024 showed a large apical infarct scar with no active ischemia.  He underwent cardiac catheterization in April demonstrating patent LIMA to small first diagonal with ostial stenosis involving the LIMA and vascular shunting from the LIMA into the left pulmonary artery.  In addition the SVG to OM/circumflex was patent in the SVG to nondominant RCA/RV branch was patent.  Case discussed within the interventional team and patient referred for a cardiac CT which did not demonstrate any vascular mass involving the LIMA.  Graft flow to native coronaries also looked to be intact and plan is medical therapy at this point.  Continue aspirin  81 mg daily, increase Imdur  to 60 mg in the morning and 30 mg in the evening, Farxiga  10 mg daily, Crestor  40 mg daily, Zetia  10 mg daily, and Ranexa  5 mg twice daily.   2.  HFmrEF with ischemic cardiomyopathy, LVEF 45 to 50% by echocardiogram in March, mild RV dysfunction as well.  Continue Coreg  3.125 mg twice daily, Farxiga  10 mg daily, and lisinopril  2.5 mg daily.   3.  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   4.  Primary hypertension.   5.  Mixed hyperlipidemia.  LDL 85 in January.  Continue Crestor  40 mg daily and 10 mg daily.  Plan to repeat lipid panel within the next 6 months.  Disposition:  Follow up 3 months.  Signed, Gerard Knight, M.D., F.A.C.C. [facility] at [facility]

## 2024-05-25 ENCOUNTER — Other Ambulatory Visit: Payer: Self-pay | Admitting: Cardiology

## 2024-05-26 ENCOUNTER — Other Ambulatory Visit: Payer: Medicaid Other

## 2024-06-24 ENCOUNTER — Ambulatory Visit

## 2024-06-24 VITALS — BP 123/76 | HR 72 | Ht 67.0 in | Wt 156.1 lb

## 2024-06-24 DIAGNOSIS — M25551 Pain in right hip: Secondary | ICD-10-CM | POA: Diagnosis not present

## 2024-06-24 DIAGNOSIS — E1165 Type 2 diabetes mellitus with hyperglycemia: Secondary | ICD-10-CM | POA: Diagnosis not present

## 2024-06-24 MED ORDER — CYCLOBENZAPRINE HCL 10 MG PO TABS
10.0000 mg | ORAL_TABLET | Freq: Three times a day (TID) | ORAL | 2 refills | Status: DC | PRN
Start: 2024-06-24 — End: 2024-11-25

## 2024-06-24 NOTE — Progress Notes (Signed)
 Was on  Established Patient Office Visit  Subjective   Patient ID: Steve Vang, male    DOB: 05-06-1966  Age: 58 y.o. MRN: [id number]  Chief Complaint  Patient presents with   Medical Management of Chronic Issues    Pt here for follow up    HPI  Patient Active Problem List   Diagnosis Date Noted   Right hip pain 10/09/2023   Adenomatous polyp of sigmoid colon 10/03/2023   Positive colorectal cancer screening using Cologuard test 08/15/2023   Hyperkalemia 07/08/2023   Tobacco use disorder 07/03/2023   Screening for colon cancer 07/03/2023   Chronic diastolic heart failure (HCC) 07/03/2023   Chronic stable angina (HCC) 07/03/2023   Type 2 diabetes mellitus (HCC) 06/25/2023   Chest pain with high risk for cardiac etiology 06/24/2023   History of Left ventricular mural thrombus 06/24/2023   CKD (chronic kidney disease) stage 2, GFR 60-89 ml/min 06/24/2023   Depression 06/24/2023   Former smoker 06/24/2023   Marijuana use, episodic 06/24/2023   Hyperglycemia 06/24/2023   Ischemic cardiomyopathy 05/10/2022   History of CVA (cerebrovascular accident) 05/10/2022   Essential tremor 05/10/2022   Status post total replacement of right hip 07/20/2016   Status post total replacement of left hip 06/08/2016   Long term (current) use of anticoagulants 04/16/2011   Mixed hyperlipidemia 03/02/2010   Essential hypertension, benign 03/02/2010   Coronary artery disease involving native coronary artery of native heart with angina pectoris (HCC)       ROS    Objective:     BP 123/76   Pulse 72   Ht 5' 7 (1.702 m)   Wt 156 lb 1.9 oz (70.8 kg)   SpO2 (!) 89%   BMI 24.45 kg/m  BP Readings from Last 3 Encounters:  06/24/24 123/76  05/11/24 138/82  04/16/24 117/73   Wt Readings from Last 3 Encounters:  06/24/24 156 lb 1.9 oz (70.8 kg)  05/11/24 162 lb 3.2 oz (73.6 kg)  04/06/24 164 lb (74.4 kg)     Physical Exam Vitals and nursing note reviewed.  Constitutional:       Appearance: Normal appearance.  HENT:     Head: Normocephalic.   Eyes:     Extraocular Movements: Extraocular movements intact.     Pupils: Pupils are equal, round, and reactive to light.    Cardiovascular:     Rate and Rhythm: Normal rate and regular rhythm.  Pulmonary:     Effort: Pulmonary effort is normal.     Breath sounds: Normal breath sounds.   Musculoskeletal:     Cervical back: Normal range of motion and neck supple.   Skin:    General: Skin is warm and dry.   Neurological:     Mental Status: He is alert and oriented to person, place, and time.   Psychiatric:        Mood and Affect: Mood normal.        Thought Content: Thought content normal.        The ASCVD Risk score (Arnett DK, et al., 2019) failed to calculate for the following reasons:   Risk score cannot be calculated because patient has a medical history suggesting prior/existing ASCVD    Assessment & Plan:   Problem List Items Addressed This Visit       Endocrine   Type 2 diabetes mellitus (HCC) - Primary (Chronic)   He is currently prescribed Farxiga . -Repeat A1c ordered today. -As otherwise documented, resume low-dose lisinopril  in the  setting of DM and CKD -Ophthalmology referral placed today for diabetic eye exam      Relevant Orders   CMP14+EGFR   Hemoglobin A1c (Completed)   Microalbumin / creatinine urine ratio (Completed)   Lipid panel (Completed)   Ambulatory referral to Ophthalmology     Other   Right hip pain   History of right hip arthroplasty.  He endorses right anterior hip pain today and ambulates with antalgic gait. -Flexeril  prescribed for as needed pain relief      Relevant Medications   cyclobenzaprine  (FLEXERIL ) 10 MG tablet    Return in about 6 months (around 12/24/2024).    Leita Longs, FNP

## 2024-06-24 NOTE — Progress Notes (Signed)
 Established Patient Office Visit  Subjective   Patient ID: Steve Vang, male    DOB: 08/10/1966  Age: 58 y.o. MRN: [id number]  Chief Complaint  Patient presents with   Medical Management of Chronic Issues    Pt here for follow up    HPI Diabetes Mellitus Type II, Follow-up  Lab Results  Component Value Date   HGBA1C 6.4 (H) 06/24/2024   HGBA1C 6.2 (H) 01/21/2024   HGBA1C 7.4 (H) 06/24/2023   Wt Readings from Last 3 Encounters:  06/24/24 156 lb 1.9 oz (70.8 kg)  05/11/24 162 lb 3.2 oz (73.6 kg)  04/06/24 164 lb (74.4 kg)   Last seen for diabetes 5 months ago.  Management since then includes medications. He reports good compliance with treatment. He is not having side effects. N/A Symptoms: No fatigue No foot ulcerations  No appetite changes No nausea  No paresthesia of the feet  No polydipsia  No polyuria No visual disturbances   No vomiting     Home blood sugar records: trend: stable  Episodes of hypoglycemia? No    Current insulin  regiment: N/A Most Recent Eye Exam: Overdue Current exercise: walking Current diet habits: in general, a healthy diet    Pertinent Labs: Lab Results  Component Value Date   CHOL 101 06/24/2024   HDL 38 (L) 06/24/2024   LDLCALC 46 06/24/2024   TRIG 86 06/24/2024   CHOLHDL 2.7 06/24/2024   Lab Results  Component Value Date   NA 139 03/31/2024   K 4.9 03/31/2024   CREATININE 1.46 (H) 03/31/2024   EGFR 55 (L) 03/31/2024   MICRALBCREAT 17 06/24/2024     --------------------------------------------------------------------------------------------------- Patient Active Problem List   Diagnosis Date Noted   Right hip pain 10/09/2023   Adenomatous polyp of sigmoid colon 10/03/2023   Positive colorectal cancer screening using Cologuard test 08/15/2023   Hyperkalemia 07/08/2023   Tobacco use disorder 07/03/2023   Screening for colon cancer 07/03/2023   Chronic diastolic heart failure (HCC) 07/03/2023   Chronic stable  angina (HCC) 07/03/2023   Type 2 diabetes mellitus (HCC) 06/25/2023   Chest pain with high risk for cardiac etiology 06/24/2023   History of Left ventricular mural thrombus 06/24/2023   CKD (chronic kidney disease) stage 2, GFR 60-89 ml/min 06/24/2023   Depression 06/24/2023   Former smoker 06/24/2023   Marijuana use, episodic 06/24/2023   Hyperglycemia 06/24/2023   Ischemic cardiomyopathy 05/10/2022   History of CVA (cerebrovascular accident) 05/10/2022   Essential tremor 05/10/2022   Status post total replacement of right hip 07/20/2016   Status post total replacement of left hip 06/08/2016   Long term (current) use of anticoagulants 04/16/2011   Mixed hyperlipidemia 03/02/2010   Essential hypertension, benign 03/02/2010   Coronary artery disease involving native coronary artery of native heart with angina pectoris (HCC)       ROS    Objective:     BP 123/76   Pulse 72   Ht 5' 7 (1.702 m)   Wt 156 lb 1.9 oz (70.8 kg)   SpO2 (!) 89%   BMI 24.45 kg/m  BP Readings from Last 3 Encounters:  06/24/24 123/76  05/11/24 138/82  04/16/24 117/73   Wt Readings from Last 3 Encounters:  06/24/24 156 lb 1.9 oz (70.8 kg)  05/11/24 162 lb 3.2 oz (73.6 kg)  04/06/24 164 lb (74.4 kg)      Physical Exam   Results for orders placed or performed in visit on 06/24/24  Hemoglobin A1c  Result Value Ref Range   Hgb A1c MFr Bld 6.4 (H) 4.8 - 5.6 %   Est. average glucose Bld gHb Est-mCnc 137 mg/dL  Microalbumin / creatinine urine ratio  Result Value Ref Range   Creatinine, Urine 122.7 Not Estab. mg/dL   Microalbumin, Urine 78.9 Not Estab. ug/mL   Microalb/Creat Ratio 17 0 - 29 mg/g creat  Lipid panel  Result Value Ref Range   Cholesterol, Total 101 100 - 199 mg/dL   Triglycerides 86 0 - 149 mg/dL   HDL 38 (L) >60 mg/dL   VLDL Cholesterol Cal 17 5 - 40 mg/dL   LDL Chol Calc (NIH) 46 0 - 99 mg/dL   Chol/HDL Ratio 2.7 0.0 - 5.0 ratio      The ASCVD Risk score (Arnett DK, et  al., 2019) failed to calculate for the following reasons:   Risk score cannot be calculated because patient has a medical history suggesting prior/existing ASCVD    Assessment & Plan:   Problem List Items Addressed This Visit       Endocrine   Type 2 diabetes mellitus (HCC) - Primary (Chronic)   He is currently prescribed Farxiga . -Repeat A1c ordered today. -As otherwise documented, resume low-dose lisinopril  in the setting of DM and CKD -Ophthalmology referral placed today for diabetic eye exam      Relevant Orders   CMP14+EGFR   Hemoglobin A1c (Completed)   Microalbumin / creatinine urine ratio (Completed)   Lipid panel (Completed)   Ambulatory referral to Ophthalmology     Other   Right hip pain   History of right hip arthroplasty.  He endorses right anterior hip pain today and ambulates with antalgic gait. -Flexeril  prescribed for as needed pain relief      Relevant Medications   cyclobenzaprine  (FLEXERIL ) 10 MG tablet    Return in about 6 months (around 12/24/2024).    Leita Longs, FNP

## 2024-06-24 NOTE — Patient Instructions (Signed)
 Schedule for diabetic eye exam in office.

## 2024-06-25 LAB — MICROALBUMIN / CREATININE URINE RATIO

## 2024-06-26 LAB — HEMOGLOBIN A1C
Est. average glucose Bld gHb Est-mCnc: 137 mg/dL
Hgb A1c MFr Bld: 6.4 % — ABNORMAL HIGH (ref 4.8–5.6)

## 2024-06-26 LAB — MICROALBUMIN / CREATININE URINE RATIO
Creatinine, Urine: 122.7 mg/dL
Microalbumin, Urine: 21 ug/mL

## 2024-06-26 LAB — LIPID PANEL
Chol/HDL Ratio: 2.7 ratio (ref 0.0–5.0)
Cholesterol, Total: 101 mg/dL (ref 100–199)
HDL: 38 mg/dL — ABNORMAL LOW (ref >39)
LDL Chol Calc (NIH): 46 mg/dL (ref 0–99)
Triglycerides: 86 mg/dL (ref 0–149)
VLDL Cholesterol Cal: 17 mg/dL (ref 5–40)

## 2024-06-26 NOTE — Assessment & Plan Note (Signed)
 He is currently prescribed Farxiga . -Repeat A1c ordered today. -As otherwise documented, resume low-dose lisinopril  in the setting of DM and CKD -Ophthalmology referral placed today for diabetic eye exam

## 2024-06-26 NOTE — Assessment & Plan Note (Signed)
History of right hip arthroplasty.  He endorses right anterior hip pain today and ambulates with antalgic gait. -Flexeril prescribed for as needed pain relief

## 2024-06-30 ENCOUNTER — Other Ambulatory Visit: Payer: Self-pay

## 2024-06-30 MED ORDER — NITROGLYCERIN 0.4 MG SL SUBL
0.4000 mg | SUBLINGUAL_TABLET | SUBLINGUAL | 11 refills | Status: AC | PRN
Start: 2024-06-30 — End: ?

## 2024-07-02 ENCOUNTER — Ambulatory Visit

## 2024-07-02 DIAGNOSIS — E1165 Type 2 diabetes mellitus with hyperglycemia: Secondary | ICD-10-CM

## 2024-07-02 LAB — HM DIABETES EYE EXAM

## 2024-07-02 NOTE — Progress Notes (Signed)
 Arrived 07/02/2024 and has given verbal consent to obtain images and complete their overdue diabetic retinal screening.  The images have been sent to an ophthalmologist or optometrist for review and interpretation.  Results will be sent back to [facility] for review.  Patient has been informed they will be contacted when we receive the results via telephone or MyChart.

## 2024-07-19 ENCOUNTER — Ambulatory Visit: Payer: Self-pay

## 2024-07-26 ENCOUNTER — Other Ambulatory Visit: Payer: Self-pay | Admitting: Nurse Practitioner

## 2024-08-03 ENCOUNTER — Other Ambulatory Visit: Payer: Self-pay

## 2024-08-03 MED ORDER — EZETIMIBE 10 MG PO TABS: 10.0000 mg | ORAL_TABLET | Freq: Every day | ORAL | 2 refills | Status: DC

## 2024-08-04 ENCOUNTER — Other Ambulatory Visit: Payer: Self-pay

## 2024-08-04 MED ORDER — ROSUVASTATIN CALCIUM 40 MG PO TABS: 40.0000 mg | ORAL_TABLET | Freq: Every day | ORAL | 2 refills | Status: DC

## 2024-08-26 ENCOUNTER — Ambulatory Visit: Attending: Cardiology | Admitting: Cardiology

## 2024-08-26 ENCOUNTER — Other Ambulatory Visit: Payer: Self-pay | Admitting: Nurse Practitioner

## 2024-08-26 ENCOUNTER — Telehealth: Payer: Self-pay | Admitting: Cardiology

## 2024-08-26 ENCOUNTER — Encounter: Payer: Self-pay | Admitting: Cardiology

## 2024-08-26 VITALS — BP 130/74 | HR 68 | Ht 65.0 in | Wt 157.4 lb

## 2024-08-26 DIAGNOSIS — I25119 Atherosclerotic heart disease of native coronary artery with unspecified angina pectoris: Secondary | ICD-10-CM | POA: Diagnosis present

## 2024-08-26 DIAGNOSIS — I5022 Chronic systolic (congestive) heart failure: Secondary | ICD-10-CM | POA: Diagnosis present

## 2024-08-26 DIAGNOSIS — E782 Mixed hyperlipidemia: Secondary | ICD-10-CM | POA: Insufficient documentation

## 2024-08-26 MED ORDER — ISOSORBIDE MONONITRATE ER 60 MG PO TB24: 60.0000 mg | ORAL_TABLET | Freq: Two times a day (BID) | ORAL | 4 refills | Status: DC

## 2024-08-26 NOTE — Progress Notes (Signed)
 Cardiology Office Note  Date: 08/26/2024   ID: Ahijah, Devery 12/15/1966, MRN [id number]  History of Present Illness: KAREN KINNARD is a 58 y.o. male last seen in May.  He is here today with his wife for a follow-up visit.  Continues to experience exertional angina as before, stable NYHA class II dyspnea, no syncope.  We discussed his medications and plan to uptitrate Imdur  for now.  Could potentially go up on Ranexa  as well.  As discussed in the prior note, revascularization options for his CAD are limited.  I reviewed his interval lab work, LDL was down to 46 in June.  Physical Exam: VS:  BP 130/74 (BP Location: Right Arm)   Pulse 68   Ht 5' 5 (1.651 m)   Wt 157 lb 6.4 oz (71.4 kg)   SpO2 96%   BMI 26.19 kg/m , BMI Body mass index is 26.19 kg/m.  Wt Readings from Last 3 Encounters:  08/26/24 157 lb 6.4 oz (71.4 kg)  06/24/24 156 lb 1.9 oz (70.8 kg)  05/11/24 162 lb 3.2 oz (73.6 kg)    General: Patient appears comfortable at rest. HEENT: Conjunctiva and lids normal. Neck: Supple, no elevated JVP or carotid bruits. Lungs: Clear to auscultation, nonlabored breathing at rest. Cardiac: Regular rate and rhythm, no S3, 1/6 systolic murmur.  ECG:  An ECG dated 03/31/2024 was personally reviewed today and demonstrated:  Sinus rhythm with increased voltage and old anterior infarct pattern.  Labwork: 01/22/2024: ALT 17; AST 18 03/31/2024: BUN 23; Creatinine, Ser 1.46; Hemoglobin 18.6; Platelets 197; Potassium 4.9; Sodium 139     Component Value Date/Time   CHOL 101 06/24/2024 0953   TRIG 86 06/24/2024 0953   HDL 38 (L) 06/24/2024 0953   CHOLHDL 2.7 06/24/2024 0953   CHOLHDL 4.2 06/25/2023 0418   VLDL 30 06/25/2023 0418   LDLCALC 46 06/24/2024 0953   LDLCALC 131 (H) 06/05/2019 0803   Other Studies Reviewed Today:  No interval cardiac testing for review today.  Assessment and Plan:  1.  Multivessel CAD status post CABG in 2006 with LIMA to first diagonal, SVG to PLB,  SVG to RVE branch of nondominant RCA.  Follow-up Lexiscan  Myoview  in May 2024 showed a large apical infarct scar with no active ischemia.  He underwent cardiac catheterization in April demonstrating patent LIMA to small first diagonal with ostial stenosis involving the LIMA and vascular shunting from the LIMA into the left pulmonary artery.  In addition the SVG to OM/circumflex was patent in the SVG to nondominant RCA/RV branch was patent.  Case discussed within the interventional team and patient referred for a cardiac CT which did not demonstrate any vascular mass involving the LIMA.  Graft flow to native coronaries also looked to be intact and plan is medical therapy at this point.  He continues to report exertional angina.  Plan to increase Imdur  to 60 mg twice daily.  Otherwise on aspirin  81 mg daily, Crestor  40 mg daily, Ranexa  500 mg twice daily, and Zetia  10 mg daily.   2.  HFmrEF with ischemic cardiomyopathy, LVEF 45 to 50% by echocardiogram in March, mild RV dysfunction as well.  Continue lisinopril  2.5 mg daily and Coreg  6.25 mg twice daily.   3.  History of LV mural thrombus with apical aneurysm resection, no longer on Coumadin , no active LV mural thrombus evident by recent cardiac CT with likely chronic calcified changes.   4.  Primary hypertension.   5.  Mixed hyperlipidemia.  LDL 46 in June.  Continue Crestor  40 mg daily and Zetia  10 mg daily.  Disposition:  Follow up 3 months.  Signed, Jayson JUDITHANN Sierras, M.D., F.A.C.C. [facility] at [facility]

## 2024-08-26 NOTE — Patient Instructions (Addendum)
 Medication Instructions:  Your physician has recommended you make the following change in your medication:  Take isosorbide  mononitrate 60 mg twice daily Continue all other medications as prescribed  Labwork: none  Testing/Procedures: none  Follow-Up: Your physician recommends that you schedule a follow-up appointment in: 3 months  Any Other Special Instructions Will Be Listed Below (If Applicable).  If you need a refill on your cardiac medications before your next appointment, please call your pharmacy.

## 2024-08-26 NOTE — Telephone Encounter (Signed)
 Clarification given to [facility] Pharmacist for imdur  60 mg prescription that was sent with 30 mg listed in directions. Advised that it imdur  60 mg BID are the new instructions and a new prescription has been sent.

## 2024-08-26 NOTE — Telephone Encounter (Signed)
 Pt c/o medication issue:  1. Name of Medication: isosorbide  mononitrate (IMDUR ) 60 MG 24 hr tablet   2. How are you currently taking this medication (dosage and times per day)? N/A  3. Are you having a reaction (difficulty breathing--STAT)? No   4. What is your medication issue? Pharmacy would like clarification on medication instructions.

## 2024-09-07 ENCOUNTER — Other Ambulatory Visit: Payer: Self-pay

## 2024-09-07 ENCOUNTER — Telehealth: Payer: Self-pay

## 2024-09-07 DIAGNOSIS — E1165 Type 2 diabetes mellitus with hyperglycemia: Secondary | ICD-10-CM

## 2024-09-07 MED ORDER — DAPAGLIFLOZIN PROPANEDIOL 10 MG PO TABS: 10.0000 mg | ORAL_TABLET | Freq: Every day | ORAL | 3 refills | Status: DC

## 2024-09-07 NOTE — Telephone Encounter (Signed)
 Patient needing Farxiga  refill sent to [facility] in [place]. Please advise Thank you

## 2024-09-08 NOTE — Telephone Encounter (Signed)
 Wife advised.

## 2024-09-09 MED ORDER — CARVEDILOL 6.25 MG PO TABS: 6.2500 mg | ORAL_TABLET | Freq: Two times a day (BID) | ORAL | 3 refills | Status: DC

## 2024-09-09 MED ORDER — RANOLAZINE ER 500 MG PO TB12: 500.0000 mg | ORAL_TABLET | Freq: Two times a day (BID) | ORAL | 3 refills | Status: DC

## 2024-09-24 ENCOUNTER — Other Ambulatory Visit: Payer: Self-pay

## 2024-09-24 DIAGNOSIS — E1165 Type 2 diabetes mellitus with hyperglycemia: Secondary | ICD-10-CM

## 2024-09-24 MED ORDER — EMPAGLIFLOZIN 10 MG PO TABS: 10.0000 mg | ORAL_TABLET | Freq: Every day | ORAL | 3 refills | Status: AC

## 2024-10-06 ENCOUNTER — Telehealth: Payer: Self-pay | Admitting: Cardiology

## 2024-10-06 NOTE — Telephone Encounter (Signed)
 [facility] medical records

## 2024-10-06 NOTE — Telephone Encounter (Signed)
 Pt came in and requested an appt for the next 2 weeks. We do not have any appointments  Over the weekend he was trasnported via EMS to [facility] in [place] due to having a massive heart attack. He did have 1 stent put in while there but they told his to follow up as soon as possible with General Cardiology.

## 2024-10-06 NOTE — Telephone Encounter (Signed)
 Schedule with elizabeth 10/9

## 2024-10-08 ENCOUNTER — Ambulatory Visit: Attending: Nurse Practitioner | Admitting: Nurse Practitioner

## 2024-10-08 ENCOUNTER — Encounter: Payer: Self-pay | Admitting: Nurse Practitioner

## 2024-10-08 ENCOUNTER — Telehealth: Payer: Self-pay | Admitting: Nurse Practitioner

## 2024-10-08 ENCOUNTER — Ambulatory Visit: Admitting: Nurse Practitioner

## 2024-10-08 VITALS — BP 120/70 | HR 60 | Ht 65.0 in | Wt 151.0 lb

## 2024-10-08 DIAGNOSIS — F1290 Cannabis use, unspecified, uncomplicated: Secondary | ICD-10-CM | POA: Insufficient documentation

## 2024-10-08 DIAGNOSIS — E782 Mixed hyperlipidemia: Secondary | ICD-10-CM | POA: Diagnosis present

## 2024-10-08 DIAGNOSIS — R42 Dizziness and giddiness: Secondary | ICD-10-CM | POA: Diagnosis present

## 2024-10-08 DIAGNOSIS — Z8673 Personal history of transient ischemic attack (TIA), and cerebral infarction without residual deficits: Secondary | ICD-10-CM | POA: Insufficient documentation

## 2024-10-08 DIAGNOSIS — I5020 Unspecified systolic (congestive) heart failure: Secondary | ICD-10-CM | POA: Insufficient documentation

## 2024-10-08 DIAGNOSIS — Z720 Tobacco use: Secondary | ICD-10-CM | POA: Insufficient documentation

## 2024-10-08 DIAGNOSIS — I2510 Atherosclerotic heart disease of native coronary artery without angina pectoris: Secondary | ICD-10-CM | POA: Diagnosis present

## 2024-10-08 DIAGNOSIS — I513 Intracardiac thrombosis, not elsewhere classified: Secondary | ICD-10-CM | POA: Insufficient documentation

## 2024-10-08 DIAGNOSIS — J449 Chronic obstructive pulmonary disease, unspecified: Secondary | ICD-10-CM | POA: Insufficient documentation

## 2024-10-08 DIAGNOSIS — I6523 Occlusion and stenosis of bilateral carotid arteries: Secondary | ICD-10-CM | POA: Insufficient documentation

## 2024-10-08 DIAGNOSIS — Z79899 Other long term (current) drug therapy: Secondary | ICD-10-CM | POA: Insufficient documentation

## 2024-10-08 MED ORDER — LOSARTAN POTASSIUM 25 MG PO TABS: 12.5000 mg | ORAL_TABLET | Freq: Two times a day (BID) | ORAL | 2 refills | Status: AC

## 2024-10-08 MED ORDER — SPIRONOLACTONE 25 MG PO TABS: 25.0000 mg | ORAL_TABLET | Freq: Every day | ORAL | 2 refills | Status: DC

## 2024-10-08 MED ORDER — ROSUVASTATIN CALCIUM 40 MG PO TABS: 40.0000 mg | ORAL_TABLET | Freq: Every day | ORAL | 2 refills | Status: AC

## 2024-10-08 MED ORDER — BLOOD GLUCOSE MONITORING SUPPL DEVI
1.0000 | Freq: Three times a day (TID) | 0 refills | Status: DC
Start: 2024-10-08 — End: 2024-10-09

## 2024-10-08 MED ORDER — EZETIMIBE 10 MG PO TABS: 10.0000 mg | ORAL_TABLET | Freq: Every day | ORAL | 2 refills | Status: AC

## 2024-10-08 MED ORDER — CARVEDILOL 6.25 MG PO TABS: 6.2500 mg | ORAL_TABLET | Freq: Two times a day (BID) | ORAL | 3 refills | Status: AC

## 2024-10-08 MED ORDER — ISOSORBIDE MONONITRATE ER 60 MG PO TB24: 60.0000 mg | ORAL_TABLET | Freq: Two times a day (BID) | ORAL | 2 refills | Status: AC

## 2024-10-08 NOTE — Telephone Encounter (Signed)
 Spoke with Lorenza at [facility]. Sent over a script for BP machine and routed demographics over as well

## 2024-10-08 NOTE — Telephone Encounter (Signed)
 [facility] needs diagnosis code- bp monitor and scales  [employer] [phone]

## 2024-10-08 NOTE — Progress Notes (Addendum)
 Office Visit    Patient Name: Steve Vang Date of Encounter: 10/08/2024 PCP:  Bevely Doffing, FNP [facility]  Cardiologist:  Jayson Sierras, MD  Advanced Practice Provider:  No care team member to display Electrophysiologist:  None  0746} Chief Complaint and HPI    Steve Vang is a 58 y.o. male with a hx of multivessel CAD, s/p CABG in 2006, HFimpEF, past history of LV mural thrombus, hypertension, mixed hyperlipidemia, T2DM, prior history of CVA, chronic dizziness, COPD, positive cologuard, and CKD, who presents today for scheduled hospital follow-up.  Previous cardiovascular history CABG in 2006 with LIMA to first diagonal, SVG to PLB, SVG to RV branch of nondominant RCA.  Follow-up Lexiscan  in May 2024 revealed a large apical infarct scar, no active ischemia. Echocardiogram in March 2025 showed LVEF 45 to 50%, mild RV dysfunction. Cardiac catheterization in April 2025-see full report below.  Case was discussed with interventional team, patient referred for cardiac CT (May 2025), did not show any vascular mass involving LIMA.  Graft flow to native coronaries looked to be intact. See full report below.  Last seen by Dr. Sierras on August 26, 2024.  At that time, patient continued to experience exertional angina, NYHA class II dyspnea.  Medical therapy was recommended.  Imdur  was increased to 60 mg twice daily.  He was hospitalized at [facility] in [place], [place]  for a massive heart attack and he is here today for hospital follow-up.  I do not have these records and I am requesting records from [facility] to be sent to our office as soon as possible.  Patient denies any recurrent chest pain since leaving the hospital.  Admits to some occasional lightheadedness that seems to be slowly improving over time per patient's report.  This has been longstanding history has been occurring ever since his stroke and before his recent heart attack.  Staying well hydrated per his report. Denies any chest pain, shortness of breath, palpitations, syncope, presyncope, orthopnea, PND, swelling or significant weight changes, acute bleeding, or claudication.  Tolerating his medications well.  Wife says heart was pumping at 35% in the hospital.  He has been weaning off his cigar usage from 4 packs/day down to 2 packs/day.  Does report to smoking weed occasionally.  EKGs/Labs/Other Studies Reviewed:   The following studies were reviewed today:   EKG:  EKG is not ordered today.   CCTA 04/2024:  IMPRESSION: 1. Severe native CAD as noted. SVG-OM and SVG-RCA are patent. LIMA is not well visualized at origin, appears small caliber, but there is flow from LIMA into D1.   2. Coronary calcium  score of 1867. This was 99th percentile for age-, sex-, and race- matched controls.   3. Evidence of prior LV aneurysm resection. No prior images for comparison. Cannot exclude chronic calcified thrombus, but no evidence of acute thrombus.   See separate CT angio report from radiology.  IMPRESSION: Aortic Atherosclerosis (ICD10-I70.0) and Emphysema (ICD10-J43.9).  Left heart cath 03/2024:    Ost LM to Mid LM lesion is 40% stenosed.   Ost LAD to Prox LAD lesion is 99% stenosed.   Prox LAD to Mid LAD lesion is 100% stenosed.   Prox Cx to Mid Cx lesion is 100% stenosed.   Ost Cx to Prox Cx lesion is 95% stenosed with 95% stenosed side branch in 1st Mrg.   Ost RCA to Prox RCA lesion is 100% stenosed.   Origin lesion is 90% stenosed.  SVG graft was visualized by angiography and is normal in caliber.   SVG graft was visualized by angiography and is large.   The graft exhibits no disease.   The graft exhibits minimal luminal irregularities.   LV end diastolic pressure is mildly elevated.   3 vessel occlusive CAD Patent LIMA to the first diagonal. The first diagonal is small. There is a ostial stenosis in the LIMA. There is a large highly vascular  structure/plexus off the LIMA that fills the left pulmonary artery.  Patent SVG to OM 4 that fills the mid to distal LCx Patent SVG to nondominant RCA/RV branch There is significant calcification of the LV apex c/w old apical infarct.    Plan: potential ischemic source of chest pain include ostial stenosis of LIMA and stenosis in native first OM. The angle from the radial approach is not favorable for stenting the LIMA- this would need femoral approach. The first OM has a difficult angulation and is calcified potentially requiring atherectomy. Before considering PCI we need to further evaluate the heavily vascular plexus off the LIMA with AV fistula to the pulmonary artery. Will discuss with imaging colleagues to see what the optimal study would be. This could be creating a steal from the LIMA and we also need to make sure this is not a hypervascular mass.   Echo 02/2024:   1. Left ventricular ejection fraction, by estimation, is 45 to 50%. The  left ventricle has mildly decreased function. The left ventricle  demonstrates regional wall motion abnormalities (see scoring  diagram/findings for description). Left ventricular  diastolic parameters are consistent with Grade II diastolic dysfunction  (pseudonormalization).   2. Right ventricular systolic function is mildly reduced. The right  ventricular size is normal. Tricuspid regurgitation signal is inadequate  for assessing PA pressure.   3. The mitral valve is grossly normal. Trivial mitral valve  regurgitation.   4. The aortic valve is tricuspid. Aortic valve regurgitation is not  visualized. Aortic valve mean gradient measures 3.0 mmHg.   5. The inferior vena cava is normal in size with greater than 50%  respiratory variability, suggesting right atrial pressure of 3 mmHg.   Comparison(s): A prior study was performed on 06/24/2023. Prior images  reviewed side by side. LVEF 45-50% range. Mild RV dysfunction.    Carotid duplex  07/2023: Summary:  Right Carotid: Velocities in the right ICA are consistent with a 1-39%  stenosis.   Left Carotid: Velocities in the left ICA are consistent with a 1-39%  stenosis.   Vertebrals: Bilateral vertebral arteries demonstrate antegrade flow.  Subclavians: Normal flow hemodynamics were seen in bilateral subclavian arteries.  Echo 06/2023:   1. Left ventricular ejection fraction, by estimation, is 50%. The left  ventricle has low normal function. The left ventricle demonstrates  regional wall motion abnormalities (see scoring diagram/findings for  description). Left ventricular diastolic  parameters are consistent with Grade II diastolic dysfunction  (pseudonormalization).   2. Right ventricular systolic function is mildly reduced. The right  ventricular size is normal.   3. The mitral valve is grossly normal. No evidence of mitral valve  regurgitation. No evidence of mitral stenosis.   4. The aortic valve has an indeterminant number of cusps. Aortic valve  regurgitation is not visualized. No aortic stenosis is present.   Conclusion(s)/Recommendation(s): No left ventricular mural or apical  thrombus/thrombi.  Myoview  05/2023:   Findings are consistent with large extensive apical infarction. Intermediate to high risk study based on large area of scar  and decreased LVEF. There is no current myocardium at jeopardy.  Consider correlating LVEF with echo.   No ST deviation was noted.   LV perfusion is abnormal. Large severe intensity fixed extensive apical defect consistent with large area of scar. There is no current ischemia.   Left ventricular function is abnormal. Nuclear stress EF: 35 %. The left ventricular ejection fraction is moderately decreased (30-44%). End diastolic cavity size is severely enlarged.  Review of Systems    All other systems reviewed and are otherwise negative except as noted above.  Physical Exam    VS:  BP 120/70 (BP Location: Right Arm, Cuff Size:  Normal)   Pulse 60   Ht 5' 5 (1.651 m)   Wt 151 lb (68.5 kg)   SpO2 99%   BMI 25.13 kg/m  , BMI Body mass index is 25.13 kg/m.  Wt Readings from Last 3 Encounters:  10/08/24 151 lb (68.5 kg)  08/26/24 157 lb 6.4 oz (71.4 kg)  06/24/24 156 lb 1.9 oz (70.8 kg)    GEN: Well nourished, well developed, in no acute distress. HEENT: normal. Neck: Supple, no JVD, carotid bruits, or masses. Cardiac: S1/S2, RRR, no murmurs, rubs, or gallops. No clubbing, cyanosis, edema.  Radials/PT 2+ and equal bilaterally.  Respiratory:  Respirations regular and unlabored, clear and diminished to auscultation bilaterally MS: No deformity or atrophy. Skin: Warm and dry, no rash. Neuro:  Strength and sensation are intact. Psych: Normal affect.  Assessment & Plan    Multivessel CAD, s/p CABG in 2006, medication management Most recently had a massive heart attack, and hospitalized at [facility] in [place]  over the past weekend.  I do not have these records and will request these records.  Currently tolerating aspirin  and Plavix at this time, wife states he received a stent.  Denies any recent chest pain.  Will provide refills and will obtain CBC and CMET in 1 week. No complications post cardiac cath.  Will provide refills of his medications. Continue current medication regimen. Smoking cessation encouraged and discussed.  Heart healthy diet encouraged.  Care and ED precautions discussed. Will discuss cardiac rehab referral with patient's attending cardiologist.   HFrEF Stage C, NYHA class I symptoms.  EF 30 to 35% as reported by his wife, and I do not have these current records from [facility] and we will request.  EF from March 2025 was 45 to 50%, most likely decline due to massive heart attack, over the past weekend.  Most likely ICM.  Euvolemic and well compensated on exam. Continue current medication regiment. Low sodium diet, fluid restriction <2L, and daily weights  encouraged. Educated to contact our office for weight gain of 2 lbs overnight or 5 lbs in one week.  Obtaining labs as noted above.  Mixed hyperlipidemia Will request records from recent hospital stay.  Continue rosuvastatin . Heart healthy diet and regular cardiovascular exercise encouraged.   4.  History of LV mural thrombus Resolved, no longer on Coumadin . TTE 06/2023 showed no LV mural or apical thrombus.  Most recent cardiac CT showed no active LV mural thrombus.  5. Carotid artery disease Mild bilateral carotid artery disease noted on carotid duplex July 2024, 1 to 39% ICA stenosis bilaterally.  Continue current medication regimen.  Heart healthy diet and regular cardiovascular exercise encouraged.  Will request records from [facility].  6. COPD Denies any recent acute exacerbations or worsening symptoms.  No medication changes at this time.  Continue to follow-up with PCP.  7. Tobacco abuse, marijuana use  Smoking cessation encouraged and discussed.  8. History of CVA, dizziness/lightheadedness Denies any concerning/red flag symptoms.  Chronic dizziness/lightheadedness ever since stroke, slowly seems to be improving over time per patient's report.  Care and ED precautions discussed.  Continue current medication regimen. Continue to follow with PCP. Heart healthy diet and regular cardiovascular exercise encouraged.  Will bring him back in 1 week for BP check.  Given BP log.  Disposition: Follow up in 6-8 weeks with Jayson Sierras, MD or APP.  Signed, Almarie Crate, NP

## 2024-10-08 NOTE — Patient Instructions (Addendum)
 Medication Instructions:  Your physician recommends that you continue on your current medications as directed. Please refer to the Current Medication list given to you today.   Labwork: CBC,CMET in 1 week   Testing/Procedures: None today  Follow-Up: 6-8 weeks  Any Other Special Instructions Will Be Listed Below (If Applicable).  If you need a refill on your cardiac medications before your next appointment, please call your pharmacy.

## 2024-10-09 ENCOUNTER — Telehealth: Payer: Self-pay | Admitting: Nurse Practitioner

## 2024-10-09 MED ORDER — MICROLIFE BPM2 BP MONITOR KIT: 1.0000 | PACK | 0 refills | Status: AC

## 2024-10-09 NOTE — Telephone Encounter (Signed)
 No answer

## 2024-10-09 NOTE — Telephone Encounter (Signed)
 Caller Staci) stated the received orders for a BP Glucose monitor not a Blood Pressure monitor.  Caller want new orders for blood pressure monitor and weight scale forwarded to them.  Fax# [phone]

## 2024-10-09 NOTE — Telephone Encounter (Signed)
 Pt c/o medication issue:  1. Name of Medication: Blood Pressure Monitoring (MICROLIFE BPM2 BP MONITOR) KIT   2. How are you currently taking this medication (dosage and times per day)? N/a  3. Are you having a reaction (difficulty breathing--STAT)? No  4. What is your medication issue? Becca with [employer] calling to ask for the diagnosis code for the Rx. Please advise. Best reached at [phone]

## 2024-10-09 NOTE — Telephone Encounter (Signed)
 Sent!

## 2024-10-09 NOTE — Addendum Note (Signed)
 Addended by: Terressa Evola A on: 10/09/2024 10:12 AM   Modules accepted: Orders

## 2024-10-10 LAB — COMPREHENSIVE METABOLIC PANEL WITH GFR
ALT: 30 IU/L (ref 0–44)
AST: 27 IU/L (ref 0–40)
Albumin: 4.9 g/dL (ref 3.8–4.9)
Alkaline Phosphatase: 109 IU/L (ref 47–123)
BUN/Creatinine Ratio: 21 — ABNORMAL HIGH (ref 9–20)
BUN: 33 mg/dL — ABNORMAL HIGH (ref 6–24)
Bilirubin Total: 0.7 mg/dL (ref 0.0–1.2)
CO2: 23 mmol/L (ref 20–29)
Calcium: 10.5 mg/dL — ABNORMAL HIGH (ref 8.7–10.2)
Chloride: 96 mmol/L (ref 96–106)
Creatinine, Ser: 1.6 mg/dL — ABNORMAL HIGH (ref 0.76–1.27)
Globulin, Total: 3.1 g/dL (ref 1.5–4.5)
Glucose: 142 mg/dL — ABNORMAL HIGH (ref 70–99)
Potassium: 5 mmol/L (ref 3.5–5.2)
Sodium: 135 mmol/L (ref 134–144)
Total Protein: 8 g/dL (ref 6.0–8.5)
eGFR: 50 mL/min/1.73 — ABNORMAL LOW (ref >59)

## 2024-10-10 LAB — CBC
Hematocrit: 51.9 % — ABNORMAL HIGH (ref 37.5–51.0)
Hemoglobin: 16.8 g/dL (ref 13.0–17.7)
MCH: 32.2 pg (ref 26.6–33.0)
MCHC: 32.4 g/dL (ref 31.5–35.7)
MCV: 100 fL — ABNORMAL HIGH (ref 79–97)
Platelets: 213 x10E3/uL (ref 150–450)
RBC: 5.21 x10E6/uL (ref 4.14–5.80)
RDW: 13 % (ref 11.6–15.4)
WBC: 6.5 x10E3/uL (ref 3.4–10.8)

## 2024-10-12 ENCOUNTER — Encounter: Payer: Self-pay | Admitting: *Deleted

## 2024-10-12 NOTE — Telephone Encounter (Signed)
 Hefref diagnosis code I50.20, dizziness R42  Tammy notified

## 2024-10-15 ENCOUNTER — Encounter: Payer: Self-pay | Admitting: Nurse Practitioner

## 2024-10-15 ENCOUNTER — Ambulatory Visit: Attending: Cardiology

## 2024-10-15 DIAGNOSIS — R42 Dizziness and giddiness: Secondary | ICD-10-CM | POA: Diagnosis present

## 2024-10-15 NOTE — Progress Notes (Signed)
 Patient here for a BP check per peck  Patient presents well just as he stated he states he is a little bleh today just tired. Patient BP is 110/70 HR 77 SPO2 99  BP log scanned into chart and visit routed to provider.

## 2024-10-16 ENCOUNTER — Ambulatory Visit: Payer: Self-pay | Admitting: Nurse Practitioner

## 2024-11-02 ENCOUNTER — Ambulatory Visit: Payer: Self-pay

## 2024-11-02 VITALS — BP 100/67 | HR 100 | Ht 67.0 in | Wt 150.0 lb

## 2024-11-02 DIAGNOSIS — F321 Major depressive disorder, single episode, moderate: Secondary | ICD-10-CM | POA: Diagnosis not present

## 2024-11-02 MED ORDER — ESCITALOPRAM OXALATE 10 MG PO TABS: 10.0000 mg | ORAL_TABLET | Freq: Every day | ORAL | 3 refills | Status: AC

## 2024-11-02 NOTE — Progress Notes (Signed)
 Established Patient Office Visit  Subjective   Patient ID: Steve Vang, male    DOB: 07/07/1966  Age: 58 y.o. MRN: [id number]  Chief Complaint  Patient presents with   Medical Management of Chronic Issues    Depression     HPI  Patient Active Problem List   Diagnosis Date Noted   Depression, major, single episode, moderate (HCC) 11/08/2024   Right hip pain 10/09/2023   Adenomatous polyp of sigmoid colon 10/03/2023   Positive colorectal cancer screening using Cologuard test 08/15/2023   Hyperkalemia 07/08/2023   Tobacco use disorder 07/03/2023   Screening for colon cancer 07/03/2023   Chronic diastolic heart failure (HCC) 07/03/2023   Chronic stable angina 07/03/2023   Type 2 diabetes mellitus (HCC) 06/25/2023   Chest pain with high risk for cardiac etiology 06/24/2023   History of Left ventricular mural thrombus 06/24/2023   CKD (chronic kidney disease) stage 2, GFR 60-89 ml/min 06/24/2023   Depression 06/24/2023   Former smoker 06/24/2023   Marijuana use, episodic 06/24/2023   Hyperglycemia 06/24/2023   Ischemic cardiomyopathy 05/10/2022   History of CVA (cerebrovascular accident) 05/10/2022   Essential tremor 05/10/2022   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4 oz (71.4 kg)     Physical Exam Vitals and nursing note reviewed. Exam  conducted with a chaperone present (pt's wife is with him today).  Constitutional:      Appearance: Normal appearance.  HENT:     Head: Normocephalic.  Eyes:     Extraocular Movements: Extraocular movements intact.     Pupils: Pupils are equal, round, and reactive to light.  Cardiovascular:     Rate and Rhythm: Normal rate and regular rhythm.  Pulmonary:     Effort: Pulmonary effort is normal.     Breath sounds: Normal breath sounds.  Musculoskeletal:     Cervical back: Normal range of motion and neck supple.  Neurological:     Mental Status: He is alert.  Psychiatric:        Attention and Perception: Attention normal.        Mood and Affect: Mood normal. Affect is blunt and flat.        Speech: Speech is delayed.        Behavior: Behavior is slowed.        Thought Content: Thought content normal.        Cognition and Memory: Cognition normal.      No results found for any visits on 11/02/24.    The ASCVD Risk score (Arnett DK, et al., 2019) failed to calculate for the following reasons:   Risk score cannot be calculated because patient has a medical history suggesting prior/existing ASCVD    Assessment & Plan:  Problem List Items Addressed This Visit       Other   Depression, major, single episode, moderate (HCC) - Primary   Add Lexapro 10 mg for ongoing depression r/t multiple heart attacks.  Advised pt and his wife to watch for worsening of depression.        Relevant Medications   escitalopram (LEXAPRO) 10 MG tablet    Return in about 3 months (around 02/02/2025) for chronic follow-up with PCP.    Leita Longs, FNP

## 2024-11-04 ENCOUNTER — Telehealth: Payer: Self-pay

## 2024-11-04 NOTE — Telephone Encounter (Signed)
 Copied from CRM [id number]. Topic: Clinical - Prescription Issue >> Nov 04, 2024  1:14 PM Delon DASEN wrote: Reason for CRM: sent prescription request for scale for patient - will resend

## 2024-11-08 DIAGNOSIS — F321 Major depressive disorder, single episode, moderate: Secondary | ICD-10-CM | POA: Insufficient documentation

## 2024-11-08 NOTE — Assessment & Plan Note (Signed)
 Add Lexapro 10 mg for ongoing depression r/t multiple heart attacks.  Advised pt and his wife to watch for worsening of depression.

## 2024-11-09 ENCOUNTER — Telehealth: Payer: Self-pay

## 2024-11-09 NOTE — Telephone Encounter (Signed)
 Please advise Copied from CRM #[id number]. Topic: General - Call Back - No Documentation >> Nov 09, 2024 11:18 AM Avram MATSU wrote: Reason for CRM: Corean called from [facility] to check if the office got the fax  for pt medical necessity  and physician order form [phone]

## 2024-11-10 NOTE — Telephone Encounter (Signed)
 In providers office

## 2024-11-13 ENCOUNTER — Telehealth: Payer: Self-pay

## 2024-11-13 NOTE — Telephone Encounter (Signed)
 Copied from CRM #[id number]. Topic: General - Other >> Nov 13, 2024 11:20 AM Travis F wrote: Reason for CRM: Stepanie with [facility] is calling in to check on the status of a fax she sent over.

## 2024-11-13 NOTE — Telephone Encounter (Signed)
Has been faxed and sent to scan

## 2024-11-16 ENCOUNTER — Ambulatory Visit: Admitting: Cardiology

## 2024-11-17 ENCOUNTER — Ambulatory Visit: Payer: Self-pay | Admitting: Nurse Practitioner

## 2024-11-25 ENCOUNTER — Other Ambulatory Visit: Payer: Self-pay

## 2024-11-25 DIAGNOSIS — M25551 Pain in right hip: Secondary | ICD-10-CM

## 2024-12-01 ENCOUNTER — Encounter: Payer: Self-pay | Admitting: Nurse Practitioner

## 2024-12-01 ENCOUNTER — Ambulatory Visit: Attending: Nurse Practitioner | Admitting: Nurse Practitioner

## 2024-12-01 VITALS — BP 110/64 | HR 84 | Ht 67.0 in | Wt 153.8 lb

## 2024-12-01 DIAGNOSIS — J449 Chronic obstructive pulmonary disease, unspecified: Secondary | ICD-10-CM | POA: Insufficient documentation

## 2024-12-01 DIAGNOSIS — Z79899 Other long term (current) drug therapy: Secondary | ICD-10-CM | POA: Insufficient documentation

## 2024-12-01 DIAGNOSIS — I5020 Unspecified systolic (congestive) heart failure: Secondary | ICD-10-CM | POA: Insufficient documentation

## 2024-12-01 DIAGNOSIS — I6523 Occlusion and stenosis of bilateral carotid arteries: Secondary | ICD-10-CM | POA: Diagnosis present

## 2024-12-01 DIAGNOSIS — I513 Intracardiac thrombosis, not elsewhere classified: Secondary | ICD-10-CM | POA: Diagnosis present

## 2024-12-01 DIAGNOSIS — I25119 Atherosclerotic heart disease of native coronary artery with unspecified angina pectoris: Secondary | ICD-10-CM | POA: Diagnosis not present

## 2024-12-01 DIAGNOSIS — R079 Chest pain, unspecified: Secondary | ICD-10-CM | POA: Diagnosis present

## 2024-12-01 DIAGNOSIS — Z720 Tobacco use: Secondary | ICD-10-CM | POA: Diagnosis present

## 2024-12-01 DIAGNOSIS — Z8673 Personal history of transient ischemic attack (TIA), and cerebral infarction without residual deficits: Secondary | ICD-10-CM | POA: Insufficient documentation

## 2024-12-01 DIAGNOSIS — E782 Mixed hyperlipidemia: Secondary | ICD-10-CM | POA: Insufficient documentation

## 2024-12-01 DIAGNOSIS — F1290 Cannabis use, unspecified, uncomplicated: Secondary | ICD-10-CM | POA: Insufficient documentation

## 2024-12-01 MED ORDER — RANOLAZINE ER 1000 MG PO TB12: 1000.0000 mg | ORAL_TABLET | Freq: Two times a day (BID) | ORAL | 2 refills | Status: AC

## 2024-12-01 NOTE — Progress Notes (Unsigned)
 Office Visit    Patient Name: Steve Vang Date of Encounter: 12/01/2024 PCP:  Bevely Doffing, FNP [facility]  Cardiologist:  Jayson Sierras, MD  Advanced Practice Provider:  No care team member to display Electrophysiologist:  None  [id number]} Chief Complaint and HPI    Steve Vang is a 58 y.o. male with a hx of multivessel CAD, s/p CABG in 2006, HFimpEF, past history of LV mural thrombus, hypertension, mixed hyperlipidemia, T2DM, prior history of CVA, chronic dizziness, COPD, positive cologuard, and CKD, who presents today for scheduled follow-up.  Previous cardiovascular history CABG in 2006 with LIMA to first diagonal, SVG to PLB, SVG to RV branch of nondominant RCA.  Follow-up Lexiscan  in May 2024 revealed a large apical infarct scar, no active ischemia. Echocardiogram in March 2025 showed LVEF 45 to 50%, mild RV dysfunction. Cardiac catheterization in April 2025-see full report below.  Case was discussed with interventional team, patient referred for cardiac CT (May 2025), did not show any vascular mass involving LIMA.  Graft flow to native coronaries looked to be intact. See full report below.  Last seen by Dr. Sierras on August 26, 2024.  At that time, patient continued to experience exertional angina, NYHA class II dyspnea.  Medical therapy was recommended.  Imdur  was increased to 60 mg twice daily.  He was hospitalized at [facility] in [place], [place]  for a massive heart attack and he is here today for hospital follow-up.  I do not have these records and I am requesting records from [facility] to be sent to our office as soon as possible.  Patient denies any recurrent chest pain since leaving the hospital.  Admits to some occasional lightheadedness that seems to be slowly improving over time per patient's report.  This has been longstanding history has been occurring ever since his stroke and before his recent heart attack. Staying well  hydrated per his report. Denies any chest pain, shortness of breath, palpitations, syncope, presyncope, orthopnea, PND, swelling or significant weight changes, acute bleeding, or claudication.  Tolerating his medications well.  Wife says heart was pumping at 35% in the hospital.  He has been weaning off his cigar usage from 4 packs/day down to 2 packs/day.  Does report to smoking weed occasionally.  12/01/2024 - I have read d/c summary from [facility]. Admitted in October 2025, transferred from [facility] hospital d/t severe CP, initial troponin was negative and subsequently increased to 0.1.  Emergent cath was done demonstrating functional occluded native coronary arteries, widely patent SVG-L PDA (left circumflex dominant with severe disease distal to the the graft anastomosis into a long but small caliber OM), patent SVG RV marginal, and LIMA to a small caliber diagonal (iatrogenic dissection with diagnostic angiography status post PCI to the ostium of the LIMA).  Was also noted he has severe diffuse calcified left main into proximal left circumflex disease with a small caliber OM1 that was also severely diseased proximally and fills antegrade via left main injection.  Echo during hospitalization revealed LVEF 35 to 40%.  Recommended for 12 months with DAPT including aspirin  plus Plavix.  Lisinopril  was discontinued.  Was recommended to follow-up with outpatient cardiology and cardiac rehab was recommended.  12/01/2024 - here for follow-up. Wife says he is being treated for depression. Wife also says he is taking Nitroglycerin  sublingual every day, multiple times per day. Noted with exertion and becoming more aggressive per his report. Noted when walking outside, has to sit  and rest and goes away 10-15 minutes after taking a nitroglycerin . Starts along left shoulder and will go to the middle of his chest. Denies any shortness of breath, palpitations, syncope, presyncope, dizziness, orthopnea, PND,  swelling or significant weight changes, acute bleeding, or claudication.  EKGs/Labs/Other Studies Reviewed:   The following studies were reviewed today:   EKG:   EKG Interpretation Date/Time:  Tuesday December 01 2024 14:49:39 EST Ventricular Rate:  66 PR Interval:  162 QRS Duration:  86 QT Interval:  400 QTC Calculation: 419 R Axis:   -15  Text Interpretation: Normal sinus rhythm Anterolateral infarct (cited on or before 31-Mar-2024) Marked ST abnormality, possible inferior subendocardial injury When compared with ECG of 31-Mar-2024 10:41, No significant change was found Confirmed by Miriam Norris [phone] on 12/03/2024 11:57:02 AM   Echo 09/2024 [facility]):  Conclusions: There is moderate concentric LVH. Left ventricular systolic function is moderately reduced.  The estimated EF is 35 to 40%. The right ventricle is grossly normal in size and right ventricular function is severely reduced.  Cardiac cath 09/2024 [facility]): Coronary angiography-the coronary circulation is left dominant. Left main -the left main artery: 20 to 30% stenosis. Left anterior descending-totally occluded after first septal. Circumflex: 85 to 90% proximal stenosis, total occlusion after OM1. Right coronary-totally occluded mid vessel.  Graft angiography -LIMA graft from unspecified to first diagonal-30% calcified subclavian stenosis, origin of the LIMA is an 85 to 90% narrowing, the downstream vessel has TIMI II flow and supplies a small to moderate size diagonal.  There is a 90% stenosis. TIMI Flow 3.   SVG graft from aorta to circumflex-widely patent graft to OM that and backfills the remainder of left dominant left circumflex, the antegrade limb is an 85 to 90% stenosis distal to the small area is poorly suited for PCI, and TIMI 3 flow.   SVG graft from aorta to acute marginal-widely patent graft origin, body, anastomosis with minimal disease in downstream vessel.  Left heart  cath: Left ventricular function was not assessed.  LV to ao pullback was performed and there was no pressure gradient.  Left ventricular end-diastolic pressure was 34 mmHg.  Interventional details: Following diagnostic angiography we upsized to an IM guide.  The patient was anticoagulated based heparin .  The LIMA was wired with a Prowater wire.  Multiple rounds of nitro were administered.  I performed IVUS that confirmed severe stenosis, and that this was not spasm.  I suspect this related to his relatively recent cath and may represent a healed iatrogenic dissection.  Based on IVUS measurements I selected a stent, 2.75 x 12 mm Xience and deployed this at high atmospheres.  Subsequent angiography demonstrated well-expanded well apposed stent and brisk TIMI-3 flow.  We then withdrew all equipment performed hemodynamic left heart catheterization demonstrated severely elevated pressures. All [place] was removed, hemostasis obtained and patient sent recovery stable condition.  LIMA graft to first diagonal-the initial stenosis was 95%.  There was TIMI flow 2 before the procedure and TIMI flow 3 following the procedure.  This was an ACC/AHA high C lesion for intervention.  IVUS was performed.  Guidewire crossing was successful.  A successful side was performed using a 6Fr IM guiding catheter, and a Prowater 180 cm Guidewire.   CCTA 04/2024:  IMPRESSION: 1. Severe native CAD as noted. SVG-OM and SVG-RCA are patent. LIMA is not well visualized at origin, appears small caliber, but there is flow from LIMA into D1.   2. Coronary calcium  score of 1867.  This was 99th percentile for age-, sex-, and race- matched controls.   3. Evidence of prior LV aneurysm resection. No prior images for comparison. Cannot exclude chronic calcified thrombus, but no evidence of acute thrombus.   See separate CT angio report from radiology.  IMPRESSION: Aortic Atherosclerosis (ICD10-I70.0) and Emphysema  (ICD10-J43.9).  Left heart cath 03/2024:    Ost LM to Mid LM lesion is 40% stenosed.   Ost LAD to Prox LAD lesion is 99% stenosed.   Prox LAD to Mid LAD lesion is 100% stenosed.   Prox Cx to Mid Cx lesion is 100% stenosed.   Ost Cx to Prox Cx lesion is 95% stenosed with 95% stenosed side branch in 1st Mrg.   Ost RCA to Prox RCA lesion is 100% stenosed.   Origin lesion is 90% stenosed.   SVG graft was visualized by angiography and is normal in caliber.   SVG graft was visualized by angiography and is large.   The graft exhibits no disease.   The graft exhibits minimal luminal irregularities.   LV end diastolic pressure is mildly elevated.   3 vessel occlusive CAD Patent LIMA to the first diagonal. The first diagonal is small. There is a ostial stenosis in the LIMA. There is a large highly vascular structure/plexus off the LIMA that fills the left pulmonary artery.  Patent SVG to OM 4 that fills the mid to distal LCx Patent SVG to nondominant RCA/RV branch There is significant calcification of the LV apex c/w old apical infarct.    Plan: potential ischemic source of chest pain include ostial stenosis of LIMA and stenosis in native first OM. The angle from the radial approach is not favorable for stenting the LIMA- this would need femoral approach. The first OM has a difficult angulation and is calcified potentially requiring atherectomy. Before considering PCI we need to further evaluate the heavily vascular plexus off the LIMA with AV fistula to the pulmonary artery. Will discuss with imaging colleagues to see what the optimal study would be. This could be creating a steal from the LIMA and we also need to make sure this is not a hypervascular mass.   Echo 02/2024:   1. Left ventricular ejection fraction, by estimation, is 45 to 50%. The  left ventricle has mildly decreased function. The left ventricle  demonstrates regional wall motion abnormalities (see scoring  diagram/findings for  description). Left ventricular  diastolic parameters are consistent with Grade II diastolic dysfunction  (pseudonormalization).   2. Right ventricular systolic function is mildly reduced. The right  ventricular size is normal. Tricuspid regurgitation signal is inadequate  for assessing PA pressure.   3. The mitral valve is grossly normal. Trivial mitral valve  regurgitation.   4. The aortic valve is tricuspid. Aortic valve regurgitation is not  visualized. Aortic valve mean gradient measures 3.0 mmHg.   5. The inferior vena cava is normal in size with greater than 50%  respiratory variability, suggesting right atrial pressure of 3 mmHg.   Comparison(s): A prior study was performed on 06/24/2023. Prior images  reviewed side by side. LVEF 45-50% range. Mild RV dysfunction.    Carotid duplex 07/2023: Summary:  Right Carotid: Velocities in the right ICA are consistent with a 1-39%  stenosis.   Left Carotid: Velocities in the left ICA are consistent with a 1-39%  stenosis.   Vertebrals: Bilateral vertebral arteries demonstrate antegrade flow.  Subclavians: Normal flow hemodynamics were seen in bilateral subclavian arteries.  Echo 06/2023:   1.  Left ventricular ejection fraction, by estimation, is 50%. The left  ventricle has low normal function. The left ventricle demonstrates  regional wall motion abnormalities (see scoring diagram/findings for  description). Left ventricular diastolic  parameters are consistent with Grade II diastolic dysfunction  (pseudonormalization).   2. Right ventricular systolic function is mildly reduced. The right  ventricular size is normal.   3. The mitral valve is grossly normal. No evidence of mitral valve  regurgitation. No evidence of mitral stenosis.   4. The aortic valve has an indeterminant number of cusps. Aortic valve  regurgitation is not visualized. No aortic stenosis is present.   Conclusion(s)/Recommendation(s): No left ventricular mural or  apical  thrombus/thrombi.  Myoview  05/2023:   Findings are consistent with large extensive apical infarction. Intermediate to high risk study based on large area of scar and decreased LVEF. There is no current myocardium at jeopardy.  Consider correlating LVEF with echo.   No ST deviation was noted.   LV perfusion is abnormal. Large severe intensity fixed extensive apical defect consistent with large area of scar. There is no current ischemia.   Left ventricular function is abnormal. Nuclear stress EF: 35 %. The left ventricular ejection fraction is moderately decreased (30-44%). End diastolic cavity size is severely enlarged.  Review of Systems    All other systems reviewed and are otherwise negative except as noted above.  Physical Exam    VS:  BP 110/64 (BP Location: Left Arm)   Pulse 84   Ht 5' 7 (1.702 m)   Wt 153 lb 12.8 oz (69.8 kg)   SpO2 96%   BMI 24.09 kg/m  , BMI Body mass index is 24.09 kg/m.  Wt Readings from Last 3 Encounters:  12/01/24 153 lb 12.8 oz (69.8 kg)  11/02/24 150 lb (68 kg)  10/08/24 151 lb (68.5 kg)    GEN: Well nourished, well developed, in no acute distress. HEENT: normal. Neck: Supple, no JVD, carotid bruits, or masses. Cardiac: S1/S2, RRR, no murmurs, rubs, or gallops. No clubbing, cyanosis, edema.  Radials/PT 2+ and equal bilaterally.  Respiratory:  Respirations regular and unlabored, clear and diminished to auscultation bilaterally MS: No deformity or atrophy. Skin: Warm and dry, no rash. Neuro:  Strength and sensation are intact. Psych: Normal affect.  Assessment & Plan    Multivessel CAD, s/p CABG in 2006, exertional chest pain, medication management Most recently hospitalized at [facility] in [place], [place] , received PCI. Does admit to exertional chest pain, taking  nitroglycerin  multiple times per day, EKG without any acute ischemic changes, will increase Ranexa  to 1,000 mg BID, medical therapy limited d/t BP trends.  Will bring him back in 1 week to repeat an EKG. Currently tolerating aspirin  and Plavix at this time. Not a current candidate for cardiac rehab d/t his current symptoms at this time. Continue current medication regimen. Smoking cessation encouraged and discussed.  Heart healthy diet encouraged.  Care and ED precautions discussed.   HFrEF Stage C, NYHA class I symptoms. EF 35-40% from 09/2024 at [facility].  EF from March 2025 was 45 to 50%, Most likely ICM.  Euvolemic and well compensated on exam. Continue current medication regiment. Low sodium diet, fluid restriction <2L, and daily weights encouraged. Educated to contact our office for weight gain of 2 lbs overnight or 5 lbs in one week.  Obtaining labs as noted above.  Mixed hyperlipidemia LDL from 05/2024 was 46. Continue rosuvastatin . Heart healthy diet and regular cardiovascular exercise encouraged.   4.  History of LV mural thrombus Resolved, no longer on Coumadin . TTE most recently at [facility] showed no thrombus.    5. Carotid artery disease Mild bilateral carotid artery disease noted on carotid duplex July 2024, 1 to 39% ICA stenosis bilaterally.  Continue current medication regimen.  Heart healthy diet and regular cardiovascular exercise encouraged.   6. COPD Denies any recent acute exacerbations or worsening symptoms.  No medication changes at this time.  Continue to follow-up with PCP.    7. Tobacco abuse, marijuana use  Smoking cessation encouraged and discussed.  8. History of CVA Denies any concerning/red flag symptoms. Care and ED precautions discussed.  Continue current medication regimen. Continue to follow with PCP. Heart healthy diet and regular cardiovascular exercise encouraged.    Disposition: Follow up in 4-6 weeks with Jayson Sierras, MD or APP.  Signed, Almarie Crate, NP

## 2024-12-01 NOTE — Patient Instructions (Signed)
 Medication Instructions:  Your physician has recommended you make the following change in your medication:  Increase Ranexa  to 1000 mg twice daily Continue taking all other medications as prescribed  Labwork: None  Testing/Procedures: None  Follow-Up: Your physician recommends that you schedule a follow-up appointment in: Nurse visit in 1 week and 4-6 weeks  Any Other Special Instructions Will Be Listed Below (If Applicable). Thank you for choosing [facility]!     If you need a refill on your cardiac medications before your next appointment, please call your pharmacy.

## 2024-12-09 ENCOUNTER — Ambulatory Visit: Attending: Cardiology

## 2024-12-09 DIAGNOSIS — Z79899 Other long term (current) drug therapy: Secondary | ICD-10-CM

## 2024-12-09 DIAGNOSIS — I255 Ischemic cardiomyopathy: Secondary | ICD-10-CM

## 2024-12-09 NOTE — Progress Notes (Signed)
 Presents for nurse visit per recent office visit with Miriam will increase Ranexa  to 1,000 mg BID, medical therapy limited d/t BP trends. Will bring him back in 1 week to repeat an EKG.   Reports taking all doses of medications as prescribed without missing doses or side effects. Denies SOB or dizziness. Reports chest pain that he's had for a while that is unchanged.   EKG done and routed to provider for review.

## 2024-12-09 NOTE — Patient Instructions (Signed)
Your physician recommends that you continue on your current medications as directed. Please refer to the Current Medication list given to you today. Your physician recommends that you schedule a follow-up appointment in: as planned 

## 2024-12-28 ENCOUNTER — Ambulatory Visit: Payer: Self-pay

## 2025-01-05 ENCOUNTER — Encounter: Payer: Self-pay | Admitting: Nurse Practitioner

## 2025-01-05 ENCOUNTER — Ambulatory Visit: Attending: Nurse Practitioner | Admitting: Nurse Practitioner

## 2025-01-05 VITALS — BP 116/72 | HR 72 | Ht 67.0 in | Wt 157.2 lb

## 2025-01-05 DIAGNOSIS — Z720 Tobacco use: Secondary | ICD-10-CM | POA: Insufficient documentation

## 2025-01-05 DIAGNOSIS — I25119 Atherosclerotic heart disease of native coronary artery with unspecified angina pectoris: Secondary | ICD-10-CM | POA: Diagnosis present

## 2025-01-05 DIAGNOSIS — I5020 Unspecified systolic (congestive) heart failure: Secondary | ICD-10-CM | POA: Insufficient documentation

## 2025-01-05 DIAGNOSIS — J449 Chronic obstructive pulmonary disease, unspecified: Secondary | ICD-10-CM | POA: Diagnosis present

## 2025-01-05 DIAGNOSIS — Z79899 Other long term (current) drug therapy: Secondary | ICD-10-CM | POA: Diagnosis present

## 2025-01-05 DIAGNOSIS — Z8673 Personal history of transient ischemic attack (TIA), and cerebral infarction without residual deficits: Secondary | ICD-10-CM | POA: Insufficient documentation

## 2025-01-05 DIAGNOSIS — E782 Mixed hyperlipidemia: Secondary | ICD-10-CM | POA: Insufficient documentation

## 2025-01-05 DIAGNOSIS — R079 Chest pain, unspecified: Secondary | ICD-10-CM | POA: Diagnosis present

## 2025-01-05 DIAGNOSIS — I513 Intracardiac thrombosis, not elsewhere classified: Secondary | ICD-10-CM | POA: Diagnosis present

## 2025-01-05 DIAGNOSIS — F1290 Cannabis use, unspecified, uncomplicated: Secondary | ICD-10-CM | POA: Insufficient documentation

## 2025-01-05 DIAGNOSIS — I6523 Occlusion and stenosis of bilateral carotid arteries: Secondary | ICD-10-CM | POA: Insufficient documentation

## 2025-01-05 MED ORDER — FUROSEMIDE 40 MG PO TABS: 40.0000 mg | ORAL_TABLET | Freq: Every day | ORAL | 3 refills | Status: AC

## 2025-01-05 MED ORDER — SPIRONOLACTONE 25 MG PO TABS: 25.0000 mg | ORAL_TABLET | Freq: Every day | ORAL | 3 refills | Status: AC

## 2025-01-05 NOTE — Progress Notes (Signed)
 "  Office Visit    Patient Name: Steve Vang Date of Encounter: 01/05/2025 PCP:  Bevely Doffing, FNP [facility]  Cardiologist:  Jayson Sierras, MD  Advanced Practice Provider:  No care team member to display Electrophysiologist:  None  [id number]} Chief Complaint and HPI    Steve Vang is a 59 y.o. male with a hx of multivessel CAD, s/p CABG in 2006, HFimpEF, past history of LV mural thrombus, hypertension, mixed hyperlipidemia, T2DM, prior history of CVA, chronic dizziness, COPD, positive cologuard, and CKD, who presents today for scheduled follow-up.  Previous cardiovascular history CABG in 2006 with LIMA to first diagonal, SVG to PLB, SVG to RV branch of nondominant RCA.  Follow-up Lexiscan  in May 2024 revealed a large apical infarct scar, no active ischemia. Echocardiogram in March 2025 showed LVEF 45 to 50%, mild RV dysfunction. Cardiac catheterization in April 2025-see full report below.  Case was discussed with interventional team, patient referred for cardiac CT (May 2025), did not show any vascular mass involving LIMA.  Graft flow to native coronaries looked to be intact. See full report below.  Last seen by Dr. Sierras on August 26, 2024.  At that time, patient continued to experience exertional angina, NYHA class II dyspnea.  Medical therapy was recommended.  Imdur  was increased to 60 mg twice daily.  He was hospitalized at [facility] in [place], [place]  for a massive heart attack and he is here today for hospital follow-up.  I do not have these records and I am requesting records from [facility] to be sent to our office as soon as possible.  Patient denies any recurrent chest pain since leaving the hospital.  Admits to some occasional lightheadedness that seems to be slowly improving over time per patient's report.  This has been longstanding history has been occurring ever since his stroke and before his recent heart attack. Staying well  hydrated per his report. Denies any chest pain, shortness of breath, palpitations, syncope, presyncope, orthopnea, PND, swelling or significant weight changes, acute bleeding, or claudication.  Tolerating his medications well.  Wife says heart was pumping at 35% in the hospital.  He has been weaning off his cigar usage from 4 packs/day down to 2 packs/day.  Does report to smoking weed occasionally.  12/01/2024 - I have read d/c summary from [facility]. Admitted in October 2025, transferred from [facility] hospital d/t severe CP, initial troponin was negative and subsequently increased to 0.1.  Emergent cath was done demonstrating functional occluded native coronary arteries, widely patent SVG-L PDA (left circumflex dominant with severe disease distal to the the graft anastomosis into a long but small caliber OM), patent SVG RV marginal, and LIMA to a small caliber diagonal (iatrogenic dissection with diagnostic angiography status post PCI to the ostium of the LIMA).  Was also noted he has severe diffuse calcified left main into proximal left circumflex disease with a small caliber OM1 that was also severely diseased proximally and fills antegrade via left main injection.  Echo during hospitalization revealed LVEF 35 to 40%.  Recommended for 12 months with DAPT including aspirin  plus Plavix.  Lisinopril  was discontinued.  Was recommended to follow-up with outpatient cardiology and cardiac rehab was recommended.  12/01/2024 - here for follow-up. Wife says he is being treated for depression. Wife also says he is taking Nitroglycerin  sublingual every day, multiple times per day. Noted with exertion and becoming more aggressive per his report. Noted when walking outside, has to  sit and rest and goes away 10-15 minutes after taking a nitroglycerin . Starts along left shoulder and will go to the middle of his chest. Denies any shortness of breath, palpitations, syncope, presyncope, dizziness, orthopnea, PND,  swelling or significant weight changes, acute bleeding, or claudication.  01/05/2025 - He is here for follow-up with his wife and says his symptoms and chest pain seems to be better compared to last office visit. Denies any accelerating symptoms. Denies any shortness of breath, palpitations, syncope, presyncope, dizziness, orthopnea, PND, swelling or significant weight changes, acute bleeding, or claudication.  EKGs/Labs/Other Studies Reviewed:   The following studies were reviewed today:   EKG: EKG is not ordered today.        Echo 09/2024 [facility]):  Conclusions: There is moderate concentric LVH. Left ventricular systolic function is moderately reduced.  The estimated EF is 35 to 40%. The right ventricle is grossly normal in size and right ventricular function is severely reduced.  Cardiac cath 09/2024 [facility]): Coronary angiography-the coronary circulation is left dominant. Left main -the left main artery: 20 to 30% stenosis. Left anterior descending-totally occluded after first septal. Circumflex: 85 to 90% proximal stenosis, total occlusion after OM1. Right coronary-totally occluded mid vessel.  Graft angiography -LIMA graft from unspecified to first diagonal-30% calcified subclavian stenosis, origin of the LIMA is an 85 to 90% narrowing, the downstream vessel has TIMI II flow and supplies a small to moderate size diagonal.  There is a 90% stenosis. TIMI Flow 3.   SVG graft from aorta to circumflex-widely patent graft to OM that and backfills the remainder of left dominant left circumflex, the antegrade limb is an 85 to 90% stenosis distal to the small area is poorly suited for PCI, and TIMI 3 flow.   SVG graft from aorta to acute marginal-widely patent graft origin, body, anastomosis with minimal disease in downstream vessel.  Left heart cath: Left ventricular function was not assessed.  LV to ao pullback was performed and there was no pressure gradient.  Left  ventricular end-diastolic pressure was 34 mmHg.  Interventional details: Following diagnostic angiography we upsized to an IM guide.  The patient was anticoagulated based heparin .  The LIMA was wired with a Prowater wire.  Multiple rounds of nitro were administered.  I performed IVUS that confirmed severe stenosis, and that this was not spasm.  I suspect this related to his relatively recent cath and may represent a healed iatrogenic dissection.  Based on IVUS measurements I selected a stent, 2.75 x 12 mm Xience and deployed this at high atmospheres.  Subsequent angiography demonstrated well-expanded well apposed stent and brisk TIMI-3 flow.  We then withdrew all equipment performed hemodynamic left heart catheterization demonstrated severely elevated pressures. All [place] was removed, hemostasis obtained and patient sent recovery stable condition.  LIMA graft to first diagonal-the initial stenosis was 95%.  There was TIMI flow 2 before the procedure and TIMI flow 3 following the procedure.  This was an ACC/AHA high C lesion for intervention.  IVUS was performed.  Guidewire crossing was successful.  A successful side was performed using a 6Fr IM guiding catheter, and a Prowater 180 cm Guidewire.   CCTA 04/2024:  IMPRESSION: 1. Severe native CAD as noted. SVG-OM and SVG-RCA are patent. LIMA is not well visualized at origin, appears small caliber, but there is flow from LIMA into D1.   2. Coronary calcium  score of 1867. This was 99th percentile for age-, sex-, and race- matched controls.   3. Evidence  of prior LV aneurysm resection. No prior images for comparison. Cannot exclude chronic calcified thrombus, but no evidence of acute thrombus.   See separate CT angio report from radiology.  IMPRESSION: Aortic Atherosclerosis (ICD10-I70.0) and Emphysema (ICD10-J43.9).  Left heart cath 03/2024:    Ost LM to Mid LM lesion is 40% stenosed.   Ost LAD to Prox LAD lesion is 99% stenosed.   Prox LAD  to Mid LAD lesion is 100% stenosed.   Prox Cx to Mid Cx lesion is 100% stenosed.   Ost Cx to Prox Cx lesion is 95% stenosed with 95% stenosed side branch in 1st Mrg.   Ost RCA to Prox RCA lesion is 100% stenosed.   Origin lesion is 90% stenosed.   SVG graft was visualized by angiography and is normal in caliber.   SVG graft was visualized by angiography and is large.   The graft exhibits no disease.   The graft exhibits minimal luminal irregularities.   LV end diastolic pressure is mildly elevated.   3 vessel occlusive CAD Patent LIMA to the first diagonal. The first diagonal is small. There is a ostial stenosis in the LIMA. There is a large highly vascular structure/plexus off the LIMA that fills the left pulmonary artery.  Patent SVG to OM 4 that fills the mid to distal LCx Patent SVG to nondominant RCA/RV branch There is significant calcification of the LV apex c/w old apical infarct.    Plan: potential ischemic source of chest pain include ostial stenosis of LIMA and stenosis in native first OM. The angle from the radial approach is not favorable for stenting the LIMA- this would need femoral approach. The first OM has a difficult angulation and is calcified potentially requiring atherectomy. Before considering PCI we need to further evaluate the heavily vascular plexus off the LIMA with AV fistula to the pulmonary artery. Will discuss with imaging colleagues to see what the optimal study would be. This could be creating a steal from the LIMA and we also need to make sure this is not a hypervascular mass.   Echo 02/2024:   1. Left ventricular ejection fraction, by estimation, is 45 to 50%. The  left ventricle has mildly decreased function. The left ventricle  demonstrates regional wall motion abnormalities (see scoring  diagram/findings for description). Left ventricular  diastolic parameters are consistent with Grade II diastolic dysfunction  (pseudonormalization).   2. Right  ventricular systolic function is mildly reduced. The right  ventricular size is normal. Tricuspid regurgitation signal is inadequate  for assessing PA pressure.   3. The mitral valve is grossly normal. Trivial mitral valve  regurgitation.   4. The aortic valve is tricuspid. Aortic valve regurgitation is not  visualized. Aortic valve mean gradient measures 3.0 mmHg.   5. The inferior vena cava is normal in size with greater than 50%  respiratory variability, suggesting right atrial pressure of 3 mmHg.   Comparison(s): A prior study was performed on 06/24/2023. Prior images  reviewed side by side. LVEF 45-50% range. Mild RV dysfunction.    Carotid duplex 07/2023: Summary:  Right Carotid: Velocities in the right ICA are consistent with a 1-39%  stenosis.   Left Carotid: Velocities in the left ICA are consistent with a 1-39%  stenosis.   Vertebrals: Bilateral vertebral arteries demonstrate antegrade flow.  Subclavians: Normal flow hemodynamics were seen in bilateral subclavian arteries.  Echo 06/2023:   1. Left ventricular ejection fraction, by estimation, is 50%. The left  ventricle has low normal  function. The left ventricle demonstrates  regional wall motion abnormalities (see scoring diagram/findings for  description). Left ventricular diastolic  parameters are consistent with Grade II diastolic dysfunction  (pseudonormalization).   2. Right ventricular systolic function is mildly reduced. The right  ventricular size is normal.   3. The mitral valve is grossly normal. No evidence of mitral valve  regurgitation. No evidence of mitral stenosis.   4. The aortic valve has an indeterminant number of cusps. Aortic valve  regurgitation is not visualized. No aortic stenosis is present.   Conclusion(s)/Recommendation(s): No left ventricular mural or apical  thrombus/thrombi.  Myoview  05/2023:   Findings are consistent with large extensive apical infarction. Intermediate to high risk  study based on large area of scar and decreased LVEF. There is no current myocardium at jeopardy.  Consider correlating LVEF with echo.   No ST deviation was noted.   LV perfusion is abnormal. Large severe intensity fixed extensive apical defect consistent with large area of scar. There is no current ischemia.   Left ventricular function is abnormal. Nuclear stress EF: 35 %. The left ventricular ejection fraction is moderately decreased (30-44%). End diastolic cavity size is severely enlarged.  Review of Systems    All other systems reviewed and are otherwise negative except as noted above.  Physical Exam    VS:  BP 116/72   Pulse 72   Ht 5' 7 (1.702 m)   Wt 157 lb 3.2 oz (71.3 kg)   SpO2 95%   BMI 24.62 kg/m  , BMI Body mass index is 24.62 kg/m.  Wt Readings from Last 3 Encounters:  01/05/25 157 lb 3.2 oz (71.3 kg)  12/01/24 153 lb 12.8 oz (69.8 kg)  11/02/24 150 lb (68 kg)    GEN: Well nourished, well developed, in no acute distress. HEENT: normal. Neck: Supple, no JVD, carotid bruits, or masses. Cardiac: S1/S2, RRR, no murmurs, rubs, or gallops. No clubbing, cyanosis, edema.  Radials/PT 2+ and equal bilaterally.  Respiratory:  Respirations regular and unlabored, clear and diminished to auscultation bilaterally MS: No deformity or atrophy. Skin: Warm and dry, no rash. Neuro:  Strength and sensation are intact. Psych: Normal affect.  Assessment & Plan    Multivessel CAD, s/p CABG in 2006, exertional chest pain, medication management Most recently hospitalized at [facility] in [place], [place] , received PCI - see HPI. Does admit to exertional chest pain, improved from last office visit. GDMT limited d/t BP trends. Currently tolerating aspirin  and Plavix at this time. Continue current medication regimen. Smoking cessation encouraged and discussed.  Heart healthy diet encouraged.  Care and ED precautions discussed. Did discuss cardiac rehab and will place  referral. Will obtain CBC, CMET, and FLP.   HFrEF Stage C, NYHA class I symptoms. EF 35-40% from 09/2024 at [facility].  EF from March 2025 was 45 to 50%, Most likely ICM.  Euvolemic and well compensated on exam. Continue current medication regiment. Low sodium diet, fluid restriction <2L, and daily weights encouraged. Educated to contact our office for weight gain of 2 lbs overnight or 5 lbs in one week.  Obtaining labs as noted above. Will update Echo in February or March of this year.   Mixed hyperlipidemia LDL from 05/2024 was 46. Continue rosuvastatin . Heart healthy diet and regular cardiovascular exercise encouraged. Checking labs as noted above.   4.  History of LV mural thrombus Resolved, no longer on Coumadin . TTE most recently at [facility] showed no thrombus.  Updating Echo as noted  above.   5. Carotid artery disease Mild bilateral carotid artery disease noted on carotid duplex July 2024, 1 to 39% ICA stenosis bilaterally.  Continue current medication regimen.  Heart healthy diet and regular cardiovascular exercise encouraged.   6. COPD Denies any recent acute exacerbations or worsening symptoms.  No medication changes at this time.  Continue to follow-up with PCP.    7. Tobacco abuse, marijuana use  Smoking cessation encouraged and discussed.  8. History of CVA Denies any concerning/red flag symptoms. Care and ED precautions discussed.  Continue current medication regimen. Continue to follow with PCP. Heart healthy diet and regular cardiovascular exercise encouraged.    Disposition: Will provide refills per his request. Follow up in 3 months with Jayson Sierras, MD or APP.  Signed, Almarie Crate, NP "

## 2025-01-05 NOTE — Patient Instructions (Addendum)
 Medication Instructions:   Lasix , Spironolactone  refilled today  Continue all other medications.     Labwork:  CBC, CMET, FLP - orders given Reminder:  Nothing to eat or drink after 12 midnight prior to labs. Office will contact with results via phone, letter or mychart.    Testing/Procedures:  Your physician has requested that you have an echocardiogram. Echocardiography is a painless test that uses sound waves to create images of your heart. It provides your doctor with information about the size and shape of your heart and how well your hearts chambers and valves are working. This procedure takes approximately one hour. There are no restrictions for this procedure. Please do NOT wear cologne, perfume, aftershave, or lotions (deodorant is allowed). Please arrive 15 minutes prior to your appointment time.  Please note: We ask at that you not bring children with you during ultrasound (echo/ vascular) testing. Due to room size and safety concerns, children are not allowed in the ultrasound rooms during exams. Our front office staff cannot provide observation of children in our lobby area while testing is being conducted. An adult accompanying a patient to their appointment will only be allowed in the ultrasound room at the discretion of the ultrasound technician under special circumstances. We apologize for any inconvenience.  DUE FEB/MARCH 2026  Follow-Up:  3 months   Any Other Special Instructions Will Be Listed Below (If Applicable).  You have been referred to:  cardiac rehab   If you need a refill on your cardiac medications before your next appointment, please call your pharmacy.

## 2025-01-08 ENCOUNTER — Other Ambulatory Visit (HOSPITAL_COMMUNITY): Payer: Self-pay

## 2025-01-08 DIAGNOSIS — Z955 Presence of coronary angioplasty implant and graft: Secondary | ICD-10-CM

## 2025-01-11 ENCOUNTER — Encounter (HOSPITAL_COMMUNITY): Payer: Self-pay

## 2025-01-14 ENCOUNTER — Encounter (HOSPITAL_COMMUNITY)
Admission: RE | Admit: 2025-01-14 | Discharge: 2025-01-14 | Disposition: A | Discharge status: Home / Self Care | Source: Ambulatory Visit | Attending: Cardiology | Admitting: Cardiology

## 2025-01-14 DIAGNOSIS — Z955 Presence of coronary angioplasty implant and graft: Secondary | ICD-10-CM | POA: Insufficient documentation

## 2025-01-14 LAB — CBC
Hematocrit: 48.1 % (ref 37.5–51.0)
Hemoglobin: 15.7 g/dL (ref 13.0–17.7)
MCH: 32.4 pg (ref 26.6–33.0)
MCHC: 32.6 g/dL (ref 31.5–35.7)
MCV: 99 fL — ABNORMAL HIGH (ref 79–97)
Platelets: 264 x10E3/uL (ref 150–450)
RBC: 4.84 x10E6/uL (ref 4.14–5.80)
RDW: 13.6 % (ref 11.6–15.4)
WBC: 6 x10E3/uL (ref 3.4–10.8)

## 2025-01-14 LAB — LIPID PANEL
Chol/HDL Ratio: 2.9 ratio (ref 0.0–5.0)
Cholesterol, Total: 157 mg/dL (ref 100–199)
HDL: 54 mg/dL
LDL Chol Calc (NIH): 70 mg/dL (ref 0–99)
Triglycerides: 201 mg/dL — ABNORMAL HIGH (ref 0–149)
VLDL Cholesterol Cal: 33 mg/dL (ref 5–40)

## 2025-01-14 NOTE — Progress Notes (Signed)
 Virtual orientation visit completed for cardiac rehab with Stent placement. On-site orientation visit scheduled for 01/18/25 at 2:30.

## 2025-01-18 ENCOUNTER — Encounter (HOSPITAL_COMMUNITY)
Admission: RE | Admit: 2025-01-18 | Discharge: 2025-01-18 | Disposition: A | Source: Ambulatory Visit | Attending: Cardiology

## 2025-01-18 VITALS — Ht 67.0 in | Wt 153.9 lb

## 2025-01-18 DIAGNOSIS — Z955 Presence of coronary angioplasty implant and graft: Secondary | ICD-10-CM

## 2025-01-18 NOTE — Progress Notes (Signed)
 Cardiac Individual Treatment Plan  Patient Details  Name: Steve Vang MRN: [id number] Date of Birth: 09-Dec-1966 Referring Provider:   Flowsheet Row CARDIAC REHAB PHASE II ORIENTATION from 01/18/2025 in [place] [facility]  Referring Provider Debera Savant MD    Initial Encounter Date:  Flowsheet Row CARDIAC REHAB PHASE II ORIENTATION from 01/18/2025 in [place] [facility]  Date 01/18/25    Visit Diagnosis: Status post coronary artery stent placement  Patient's Home Medications on Admission: Current Medications[1]  Past Medical History: Past Medical History:  Diagnosis Date   CAD (coronary artery disease)    Multivessel, LVEF 45-50%   Cardiomyopathy (HCC)    LVEF 50-55% December 2016   CKD (chronic kidney disease) stage 2, GFR 60-89 ml/min    COPD (chronic obstructive pulmonary disease) (HCC)    Diastolic heart failure (HCC)    Essential hypertension    History of stroke 2004   Hyperlipidemia    Left ventricular mural thrombus    MDD in remission    Not   Myocardial infarction [facility])    Stroke (HCC)    T2DM (type 2 diabetes mellitus) (HCC) 06/24/2023   Diagnoised with Hgb a1c of 7.5   Tobacco use disorder     Tobacco Use: Tobacco Use History[2]  Labs: Review Flowsheet  More data exists      Latest Ref Rng & Units 06/25/2023 01/21/2024 01/22/2024 06/24/2024 01/13/2025  Labs for ITP Cardiac and Pulmonary Rehab  Cholestrol 100 - 199 mg/dL 838  - 855  898  842   LDL (calc) 0 - 99 mg/dL 93  - 85  46  70   HDL-C >39 mg/dL 38  - 42  38  54   Trlycerides 0 - 149 mg/dL 850  - 88  86  798   Hemoglobin A1c 4.8 - 5.6 % - 6.2  - 6.4  -     Exercise Target Goals: Exercise Program Goal: Individual exercise prescription set using results from initial 6 min walk test and THRR while considering  patients activity barriers and safety.   Exercise Prescription Goal: Initial exercise prescription builds to 30-45 minutes a day of aerobic activity, 2-3  days per week.  Home exercise guidelines will be given to patient during program as part of exercise prescription that the participant will acknowledge.   Education: Aerobic Exercise: - Group verbal and visual presentation on the components of exercise prescription. Introduces F.I.T.T principle from ACSM for exercise prescriptions.  Reviews F.I.T.T. principles of aerobic exercise including progression. Written material provided at class time.   Education: Resistance Exercise: - Group verbal and visual presentation on the components of exercise prescription. Introduces F.I.T.T principle from ACSM for exercise prescriptions  Reviews F.I.T.T. principles of resistance exercise including progression. Written material provided at class time.    Education: Exercise & Equipment Safety: - Individual verbal instruction and demonstration of equipment use and safety with use of the equipment.   Education: Exercise Physiology & General Exercise Guidelines: - Group verbal and written instruction with models to review the exercise physiology of the cardiovascular system and associated critical values. Provides general exercise guidelines with specific guidelines to those with heart or lung disease. Written material provided at class time.   Education: Flexibility, Balance, Mind/Body Relaxation: - Group verbal and visual presentation with interactive activity on the components of exercise prescription. Introduces F.I.T.T principle from ACSM for exercise prescriptions. Reviews F.I.T.T. principles of flexibility and balance exercise training including progression. Also discusses the mind body connection.  Reviews various relaxation techniques to help reduce and manage stress (i.e. Deep breathing, progressive muscle relaxation, and visualization). Balance handout provided to take home. Written material provided at class time.   Activity Barriers & Risk Stratification:  Activity Barriers & Cardiac Risk  Stratification - 01/14/25 1530       Activity Barriers & Cardiac Risk Stratification   Activity Barriers History of Falls;Left Hip Replacement;Right Hip Replacement;Shortness of Breath;Balance Concerns;Chest Pain/Angina   CVA 2004 with balance issues.   Cardiac Risk Stratification High          6 Minute Walk:  6 Minute Walk     Row Name 01/18/25 1527         6 Minute Walk   Phase Initial     Distance 1100 feet     Distance Feet Change 0 ft     Walk Time 6 minutes     # of Rest Breaks 0     MPH 2.08     METS 3.29     RPE 12     Perceived Dyspnea  0     VO2 Peak 11.5     Symptoms Yes (comment)     Comments R hip pain 2/10     Resting HR 83 bpm     Resting BP 116/70     Resting Oxygen Saturation  96 %     Exercise Oxygen Saturation  during 6 min walk 97 %     Max Ex. HR 101 bpm     Max Ex. BP 122/70     2 Minute Post BP 118/70        Oxygen Initial Assessment:   Oxygen Re-Evaluation:   Oxygen Discharge (Final Oxygen Re-Evaluation):   Initial Exercise Prescription:  Initial Exercise Prescription - 01/18/25 1500       Date of Initial Exercise RX and Referring Provider   Date 01/18/25    Referring Provider Debera Savant MD      NuStep   Level 1    SPM 50    Minutes 15    METs 1.8      REL-XR   Level 1    Speed 50    Minutes 15    METs 1.8      Prescription Details   Frequency (times per week) 2    Duration Progress to 30 minutes of continuous aerobic without signs/symptoms of physical distress      Intensity   THRR 40-80% of Max Heartrate 114-145    Ratings of Perceived Exertion 11-13      Resistance Training   Training Prescription Yes    Weight 4          Perform Capillary Blood Glucose checks as needed.  Exercise Prescription Changes:   Exercise Prescription Changes     Row Name 01/18/25 1500             Response to Exercise   Blood Pressure (Admit) 116/70       Blood Pressure (Exercise) 122/70       Blood Pressure  (Exit) 118/70       Heart Rate (Admit) 83 bpm       Heart Rate (Exercise) 101 bpm       Heart Rate (Exit) 85 bpm       Oxygen Saturation (Admit) 96 %       Oxygen Saturation (Exercise) 97 %       Oxygen Saturation (Exit) 97 %       Rating  of Perceived Exertion (Exercise) 12       Perceived Dyspnea (Exercise) 0          Exercise Comments:   Exercise Goals and Review:   Exercise Goals     Row Name 01/18/25 1534             Exercise Goals   Increase Physical Activity Yes       Intervention Provide advice, education, support and counseling about physical activity/exercise needs.;Develop an individualized exercise prescription for aerobic and resistive training based on initial evaluation findings, risk stratification, comorbidities and participant's personal goals.       Expected Outcomes Short Term: Attend rehab on a regular basis to increase amount of physical activity.;Long Term: Add in home exercise to make exercise part of routine and to increase amount of physical activity.;Long Term: Exercising regularly at least 3-5 days a week.       Increase Strength and Stamina Yes       Intervention Provide advice, education, support and counseling about physical activity/exercise needs.;Develop an individualized exercise prescription for aerobic and resistive training based on initial evaluation findings, risk stratification, comorbidities and participant's personal goals.       Expected Outcomes Short Term: Increase workloads from initial exercise prescription for resistance, speed, and METs.;Short Term: Perform resistance training exercises routinely during rehab and add in resistance training at home;Long Term: Improve cardiorespiratory fitness, muscular endurance and strength as measured by increased METs and functional capacity ( )       Able to understand and use rate of perceived exertion (RPE) scale Yes       Intervention Provide education and explanation on how to use RPE scale        Expected Outcomes Short Term: Able to use RPE daily in rehab to express subjective intensity level;Long Term:  Able to use RPE to guide intensity level when exercising independently       Able to understand and use Dyspnea scale Yes       Intervention Provide education and explanation on how to use Dyspnea scale       Expected Outcomes Long Term: Able to use Dyspnea scale to guide intensity level when exercising independently;Short Term: Able to use Dyspnea scale daily in rehab to express subjective sense of shortness of breath during exertion       Knowledge and understanding of Target Heart Rate Range (THRR) Yes       Intervention Provide education and explanation of THRR including how the numbers were predicted and where they are located for reference       Expected Outcomes Short Term: Able to state/look up THRR;Long Term: Able to use THRR to govern intensity when exercising independently;Short Term: Able to use daily as guideline for intensity in rehab       Able to check pulse independently Yes       Intervention Provide education and demonstration on how to check pulse in carotid and radial arteries.;Review the importance of being able to check your own pulse for safety during independent exercise       Expected Outcomes Short Term: Able to explain why pulse checking is important during independent exercise;Long Term: Able to check pulse independently and accurately       Understanding of Exercise Prescription Yes       Intervention Provide education, explanation, and written materials on patient's individual exercise prescription       Expected Outcomes Short Term: Able to explain program exercise prescription;Long Term: Able to  explain home exercise prescription to exercise independently          Exercise Goals Re-Evaluation :   Discharge Exercise Prescription (Final Exercise Prescription Changes):  Exercise Prescription Changes - 01/18/25 1500       Response to Exercise   Blood  Pressure (Admit) 116/70    Blood Pressure (Exercise) 122/70    Blood Pressure (Exit) 118/70    Heart Rate (Admit) 83 bpm    Heart Rate (Exercise) 101 bpm    Heart Rate (Exit) 85 bpm    Oxygen Saturation (Admit) 96 %    Oxygen Saturation (Exercise) 97 %    Oxygen Saturation (Exit) 97 %    Rating of Perceived Exertion (Exercise) 12    Perceived Dyspnea (Exercise) 0          Nutrition:  Target Goals: Understanding of nutrition guidelines, daily intake of sodium 1500mg , cholesterol 200mg , calories 30% from fat and 7% or less from saturated fats, daily to have 5 or more servings of fruits and vegetables.  Education: Nutrition 1 -Group instruction provided by verbal, written material, interactive activities, discussions, models, and posters to present general guidelines for heart healthy nutrition including macronutrients, label reading, and promoting whole foods over processed counterparts. Education serves as pensions consultant of discussion of heart healthy eating for all. Written material provided at class time.    Education: Nutrition 2 -Group instruction provided by verbal, written material, interactive activities, discussions, models, and posters to present general guidelines for heart healthy nutrition including sodium, cholesterol, and saturated fat. Providing guidance of habit forming to improve blood pressure, cholesterol, and body weight. Written material provided at class time.     Biometrics:  Pre Biometrics - 01/18/25 1535       Pre Biometrics   Height 5' 7 (1.702 m)    Weight 69.8 kg    Waist Circumference 35 inches    Hip Circumference 37 inches    Waist to Hip Ratio 0.95 %    BMI (Calculated) 24.1    Grip Strength 14.2 kg           Nutrition Therapy Plan and Nutrition Goals:   Nutrition Assessments:  MEDIFICTS Score Key: >=70 Need to make dietary changes  40-70 Heart Healthy Diet <= 40 Therapeutic Level Cholesterol Diet  Flowsheet Row CARDIAC VIRTUAL  BASED CARE from 01/14/2025 in [facility]  Picture Your Plate Total Score on Admission 54   Picture Your Plate Scores: <59 Unhealthy dietary pattern with much room for improvement. 41-50 Dietary pattern unlikely to meet recommendations for good health and room for improvement. 51-60 More healthful dietary pattern, with some room for improvement.  >60 Healthy dietary pattern, although there may be some specific behaviors that could be improved.    Nutrition Goals Re-Evaluation:   Nutrition Goals Discharge (Final Nutrition Goals Re-Evaluation):   Psychosocial: Target Goals: Acknowledge presence or absence of significant depression and/or stress, maximize coping skills, provide positive support system. Participant is able to verbalize types and ability to use techniques and skills needed for reducing stress and depression.   Education: Stress, Anxiety, and Depression - Group verbal and visual presentation to define topics covered.  Reviews how body is impacted by stress, anxiety, and depression.  Also discusses healthy ways to reduce stress and to treat/manage anxiety and depression. Written material provided at class time.   Education: Sleep Hygiene -Provides group verbal and written instruction about how sleep can affect your health.  Define sleep hygiene, discuss sleep cycles and impact  of sleep habits. Review good sleep hygiene tips.   Initial Review & Psychosocial Screening:  Initial Psych Review & Screening - 01/14/25 1550       Initial Review   Current issues with History of Depression;Current Anxiety/Panic;Current Psychotropic Meds;Current Depression      Family Dynamics   Good Support System? Yes    Comments Patient's wife and 2 sons support him well.      Barriers   Psychosocial barriers to participate in program The patient should benefit from training in stress management and relaxation.;There are no identifiable barriers or psychosocial needs.       Screening Interventions   Interventions To provide support and resources with identified psychosocial needs;Encouraged to exercise;Provide feedback about the scores to participant    Expected Outcomes Short Term goal: Utilizing psychosocial counselor, staff and physician to assist with identification of specific Stressors or current issues interfering with healing process. Setting desired goal for each stressor or current issue identified.;Long Term Goal: Stressors or current issues are controlled or eliminated.;Short Term goal: Identification and review with participant of any Quality of Life or Depression concerns found by scoring the questionnaire.;Long Term goal: The participant improves quality of Life and PHQ9 Scores as seen by post scores and/or verbalization of changes          Quality of Life Scores:   Quality of Life - 01/14/25 1548       Quality of Life   Select Quality of Life      Quality of Life Scores   Health/Function Pre 14.4 %    Socioeconomic Pre 18.13 %    Psych/Spiritual Pre 21.21 %    Family Pre 24 %    GLOBAL Pre 17.99 %         Scores of 19 and below usually indicate a poorer quality of life in these areas.  A difference of  2-3 points is a clinically meaningful difference.  A difference of 2-3 points in the total score of the Quality of Life Index has been associated with significant improvement in overall quality of life, self-image, physical symptoms, and general health in studies assessing change in quality of life.  PHQ-9: Review Flowsheet  More data exists      01/18/2025 11/02/2024 06/24/2024 01/21/2024 10/01/2023  Depression screen PHQ 2/9  Decreased Interest 1 1 1 1 1   Down, Depressed, Hopeless 3 3 3 2 2   PHQ - 2 Score 4 4 4 3 3   Altered sleeping 1 3 3  0 0  Tired, decreased energy 2 0 0 1 1  Change in appetite 1 2 2 3 3   Feeling bad or failure about yourself  3 3 3 2 2   Trouble concentrating 1 0 0 2 2  Moving slowly or fidgety/restless 2 0 0 0 1   Suicidal thoughts 3 0 0 0 0  PHQ-9 Score 17 12  12  11  12    Difficult doing work/chores Somewhat difficult Very difficult Somewhat difficult Somewhat difficult Somewhat difficult    Details       Data saved with a previous flowsheet row definition        Interpretation of Total Score  Total Score Depression Severity:  1-4 = Minimal depression, 5-9 = Mild depression, 10-14 = Moderate depression, 15-19 = Moderately severe depression, 20-27 = Severe depression   Psychosocial Evaluation and Intervention:  Psychosocial Evaluation - 01/14/25 1550       Psychosocial Evaluation & Interventions   Interventions Stress management education;Relaxation education;Encouraged to  exercise with the program and follow exercise prescription    Comments Patient was referred to cardiac rehab with stent placement. He says he is currently being treated for depression with medication and is seeing his PCP for a referral to a counselor. He continues to have chest pain which he takes SL NTG usually relieved with 2 which was noted in [facility] office note. He says Dr. Debera is also aware of his chest pain. He says he sleeps well. He had a CVA in 2004 which caused balance issues. 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 His goals for the program are to be able to walk further with less chest pain and to be able to increase his activities. His chest pain may be a barrier for him to participate in the program. We will continue to monitor.    Expected Outcomes Short Term: Patient will start the program and attend consistently. Long Term: Patient will complete the program meeting personal goals.    Continue Psychosocial Services  Follow up required by staff          Psychosocial Re-Evaluation:   Psychosocial Discharge (Final Psychosocial Re-Evaluation):   Vocational Rehabilitation: Provide vocational rehab assistance to qualifying candidates.   Vocational Rehab Evaluation & Intervention:   Vocational Rehab - 01/14/25 1548       Initial Vocational Rehab Evaluation & Intervention   Assessment shows need for Vocational Rehabilitation No      Vocational Rehab Re-Evaulation   Comments Disabled.          Education: Education Goals: Education classes will be provided on a variety of topics geared toward better understanding of heart health and risk factor modification. Participant will state understanding/return demonstration of topics presented as noted by education test scores.  Learning Barriers/Preferences:  Learning Barriers/Preferences - 01/14/25 1548       Learning Barriers/Preferences   Learning Barriers --   Completed 8th grade.   Learning Preferences Skilled Demonstration          General Cardiac Education Topics:  AED/CPR: - Group verbal and written instruction with the use of models to demonstrate the basic use of the AED with the basic ABC's of resuscitation.   Test and Procedures: - Group verbal and visual presentation and models provide information about basic cardiac anatomy and function. Reviews the testing methods done to diagnose heart disease and the outcomes of the test results. Describes the treatment choices: Medical Management, Angioplasty, or Coronary Bypass Surgery for treating various heart conditions including Myocardial Infarction, Angina, Valve Disease, and Cardiac Arrhythmias. Written material provided at class time.   Medication Safety: - Group verbal and visual instruction to review commonly prescribed medications for heart and lung disease. Reviews the medication, class of the drug, and side effects. Includes the steps to properly store meds and maintain the prescription regimen. Written material provided at class time.   Intimacy: - Group verbal instruction through game format to discuss how heart and lung disease can affect sexual intimacy. Written material provided at class time.   Know Your Numbers and Heart Failure: - Group  verbal and visual instruction to discuss disease risk factors for cardiac and pulmonary disease and treatment options.  Reviews associated critical values for Overweight/Obesity, Hypertension, Cholesterol, and Diabetes.  Discusses basics of heart failure: signs/symptoms and treatments.  Introduces Heart Failure Zone chart for action plan for heart failure. Written material provided at class time.   Infection Prevention: - Provides verbal and written material to  individual with discussion of infection control including proper hand washing and proper equipment cleaning during exercise session.   Falls Prevention: - Provides verbal and written material to individual with discussion of falls prevention and safety.   Other: -Provides group and verbal instruction on various topics (see comments)   Knowledge Questionnaire Score:  Knowledge Questionnaire Score - 01/14/25 1548       Knowledge Questionnaire Score   Pre Score 21/26          Core Components/Risk Factors/Patient Goals at Admission:  Personal Goals and Risk Factors at Admission - 01/14/25 1547       Core Components/Risk Factors/Patient Goals on Admission    Weight Management Weight Maintenance    Improve shortness of breath with ADL's Yes    Intervention Provide education, individualized exercise plan and daily activity instruction to help decrease symptoms of SOB with activities of daily living.    Expected Outcomes Short Term: Improve cardiorespiratory fitness to achieve a reduction of symptoms when performing ADLs;Long Term: Be able to perform more ADLs without symptoms or delay the onset of symptoms    Diabetes Yes    Intervention Provide education about signs/symptoms and action to take for hypo/hyperglycemia.;Provide education about proper nutrition, including hydration, and aerobic/resistive exercise prescription along with prescribed medications to achieve blood glucose in normal ranges: Fasting glucose 65-99 mg/dL     Expected Outcomes Short Term: Participant verbalizes understanding of the signs/symptoms and immediate care of hyper/hypoglycemia, proper foot care and importance of medication, aerobic/resistive exercise and nutrition plan for blood glucose control.;Long Term: Attainment of HbA1C < 7%.    Heart Failure Yes    Intervention Provide a combined exercise and nutrition program that is supplemented with education, support and counseling about heart failure. Directed toward relieving symptoms such as shortness of breath, decreased exercise tolerance, and extremity edema.    Expected Outcomes Improve functional capacity of life;Short term: Attendance in program 2-3 days a week with increased exercise capacity. Reported lower sodium intake. Reported increased fruit and vegetable intake. Reports medication compliance.;Short term: Daily weights obtained and reported for increase. Utilizing diuretic protocols set by physician.;Long term: Adoption of self-care skills and reduction of barriers for early signs and symptoms recognition and intervention leading to self-care maintenance.    Hypertension Yes    Intervention Provide education on lifestyle modifcations including regular physical activity/exercise, weight management, moderate sodium restriction and increased consumption of fresh fruit, vegetables, and low fat dairy, alcohol moderation, and smoking cessation.;Monitor prescription use compliance.    Expected Outcomes Short Term: Continued assessment and intervention until BP is < 140/89mm HG in hypertensive participants. < 130/23mm HG in hypertensive participants with diabetes, heart failure or chronic kidney disease.;Long Term: Maintenance of blood pressure at goal levels.    Lipids Yes    Intervention Provide education and support for participant on nutrition & aerobic/resistive exercise along with prescribed medications to achieve LDL 70mg , HDL >40mg .    Expected Outcomes Short Term: Participant states  understanding of desired cholesterol values and is compliant with medications prescribed. Participant is following exercise prescription and nutrition guidelines.;Long Term: Cholesterol controlled with medications as prescribed, with individualized exercise RX and with personalized nutrition plan. Value goals: LDL < 70mg , HDL > 40 mg.          Education:Diabetes - Individual verbal and written instruction to review signs/symptoms of diabetes, desired ranges of glucose level fasting, after meals and with exercise. Acknowledge that pre and post exercise glucose checks will be done for 3 sessions  at entry of program.   Core Components/Risk Factors/Patient Goals Review:    Core Components/Risk Factors/Patient Goals at Discharge (Final Review):    ITP Comments:  ITP Comments     Row Name 01/14/25 1558 01/18/25 1541         ITP Comments Virtual orientation visit completed for cardiac rehab with Stent placement. On-site orientation visit scheduled for 01/18/25 at 2:30. Patient arrived for 1st visit/orientation/education at 1415. Patient was referred to CR by Dr. CANDIE Sierras due to Status Post Coronary Artery Stent Placement. During orientation advised patient on arrival and appointment times what to wear, what to do before, during and after exercise. Reviewed attendance and class policy.  Pt is scheduled to return Cardiac Rehab on 01/21/25 at 1330. Pt was advised to come to class 15 minutes before class starts.  Discussed RPE/Dpysnea scales. Patient participated in warm up stretches. Patient was able to complete 6 minute walk test.  Telemetry:NSR. Patient was measured for the equipment. Discussed equipment safety with patient. Took patient pre-anthropometric measurements. Patient finished visit at 1530.         Comments: Patient arrived for 1st visit/orientation/education at 36. Patient was referred to CR by Dr. CANDIE Sierras due to Status Post Coronary Artery Stent Placement. During orientation  advised patient on arrival and appointment times what to wear, what to do before, during and after exercise. Reviewed attendance and class policy.  Pt is scheduled to return Cardiac Rehab on 01/21/25 at 1330. Pt was advised to come to class 15 minutes before class starts.  Discussed RPE/Dpysnea scales. Patient participated in warm up stretches. Patient was able to complete 6 minute walk test.  Telemetry:NSR. Patient was measured for the equipment. Discussed equipment safety with patient. Took patient pre-anthropometric measurements. Patient finished visit at 1530.      [1]  Current Outpatient Medications:    aspirin  EC 81 MG tablet, Take 81 mg by mouth daily., Disp: , Rfl:    blood glucose meter kit and supplies, Dispense based on patient and insurance preference. Use three times daily DX E11.65, Disp: 1 each, Rfl: 0   Blood Pressure Monitoring (MICROLIFE BPM2 BP MONITOR) KIT, 1 each by Does not apply route as directed., Disp: 1 kit, Rfl: 0   carvedilol  (COREG ) 6.25 MG tablet, Take 1 tablet (6.25 mg total) by mouth 2 (two) times daily., Disp: 180 tablet, Rfl: 3   clopidogrel (PLAVIX) 75 MG tablet, Take 75 mg by mouth daily., Disp: , Rfl:    cyclobenzaprine  (FLEXERIL ) 10 MG tablet, TAKE 1 TABLET BY MOUTH THREE TIMES DAILY AS NEEDED FOR MUSCLE SPASMS, Disp: 90 tablet, Rfl: 0   empagliflozin  (JARDIANCE ) 10 MG TABS tablet, Take 1 tablet (10 mg total) by mouth daily., Disp: 90 tablet, Rfl: 3   escitalopram  (LEXAPRO ) 10 MG tablet, Take 1 tablet (10 mg total) by mouth daily., Disp: 30 tablet, Rfl: 3   ezetimibe  (ZETIA ) 10 MG tablet, Take 1 tablet (10 mg total) by mouth daily., Disp: 90 tablet, Rfl: 2   furosemide  (LASIX ) 40 MG tablet, Take 1 tablet (40 mg total) by mouth daily., Disp: 90 tablet, Rfl: 3   isosorbide  mononitrate (IMDUR ) 60 MG 24 hr tablet, Take 1 tablet (60 mg total) by mouth 2 (two) times daily., Disp: 180 tablet, Rfl: 2   losartan  (COZAAR ) 25 MG tablet, Take 0.5 tablets (12.5 mg total) by  mouth 2 (two) times daily., Disp: 45 tablet, Rfl: 2   nitroGLYCERIN  (NITROSTAT ) 0.4 MG SL tablet, Place 1 tablet (0.4 mg  total) under the tongue every 5 (five) minutes x 3 doses as needed for chest pain (If no relief after 3rd dose, proceed to ED or call 911)., Disp: 25 tablet, Rfl: 11   ranolazine  (RANEXA ) 1000 MG SR tablet, Take 1 tablet (1,000 mg total) by mouth 2 (two) times daily., Disp: 180 tablet, Rfl: 2   rosuvastatin  (CRESTOR ) 40 MG tablet, Take 1 tablet (40 mg total) by mouth daily., Disp: 90 tablet, Rfl: 2   spironolactone  (ALDACTONE ) 25 MG tablet, Take 1 tablet (25 mg total) by mouth daily., Disp: 90 tablet, Rfl: 3 [2]  Social History Tobacco Use  Smoking Status Some Days   Types: Cigars  Smokeless Tobacco Never  Tobacco Comments   Vaping as well

## 2025-01-18 NOTE — Patient Instructions (Signed)
 Patient Instructions  Patient Details  Name: Steve Vang MRN: [id number] Date of Birth: 06/11/1966 Referring Provider:  Debera Jayson MATSU, MD  Below are your personal goals for exercise, nutrition, and risk factors. Our goal is to help you stay on track towards obtaining and maintaining these goals. We will be discussing your progress on these goals with you throughout the program.  Initial Exercise Prescription:  Initial Exercise Prescription - 01/18/25 1500       Date of Initial Exercise RX and Referring Provider   Date 01/18/25    Referring Provider Debera Jayson MD      NuStep   Level 1    SPM 50    Minutes 15    METs 1.8      REL-XR   Level 1    Speed 50    Minutes 15    METs 1.8      Prescription Details   Frequency (times per week) 2    Duration Progress to 30 minutes of continuous aerobic without signs/symptoms of physical distress      Intensity   THRR 40-80% of Max Heartrate 114-145    Ratings of Perceived Exertion 11-13      Resistance Training   Training Prescription Yes    Weight 4          Exercise Goals: Frequency: Be able to perform aerobic exercise two to three times per week in program working toward 2-5 days per week of home exercise.  Intensity: Work with a perceived exertion of 11 (fairly light) - 15 (hard) while following your exercise prescription.  We will make changes to your prescription with you as you progress through the program.   Duration: Be able to do 30 to 45 minutes of continuous aerobic exercise in addition to a 5 minute warm-up and a 5 minute cool-down routine.   Nutrition Goals: Your personal nutrition goals will be established when you do your nutrition analysis with the dietician.  The following are general nutrition guidelines to follow: Cholesterol < 200mg /day Sodium < 1500mg /day Fiber: Men over 50 yrs - 30 grams per day  Personal Goals:  Personal Goals and Risk Factors at Admission - 01/14/25 1547        Core Components/Risk Factors/Patient Goals on Admission    Weight Management Weight Maintenance    Improve shortness of breath with ADL's Yes    Intervention Provide education, individualized exercise plan and daily activity instruction to help decrease symptoms of SOB with activities of daily living.    Expected Outcomes Short Term: Improve cardiorespiratory fitness to achieve a reduction of symptoms when performing ADLs;Long Term: Be able to perform more ADLs without symptoms or delay the onset of symptoms    Diabetes Yes    Intervention Provide education about signs/symptoms and action to take for hypo/hyperglycemia.;Provide education about proper nutrition, including hydration, and aerobic/resistive exercise prescription along with prescribed medications to achieve blood glucose in normal ranges: Fasting glucose 65-99 mg/dL    Expected Outcomes Short Term: Participant verbalizes understanding of the signs/symptoms and immediate care of hyper/hypoglycemia, proper foot care and importance of medication, aerobic/resistive exercise and nutrition plan for blood glucose control.;Long Term: Attainment of HbA1C < 7%.    Heart Failure Yes    Intervention Provide a combined exercise and nutrition program that is supplemented with education, support and counseling about heart failure. Directed toward relieving symptoms such as shortness of breath, decreased exercise tolerance, and extremity edema.    Expected Outcomes Improve functional capacity  of life;Short term: Attendance in program 2-3 days a week with increased exercise capacity. Reported lower sodium intake. Reported increased fruit and vegetable intake. Reports medication compliance.;Short term: Daily weights obtained and reported for increase. Utilizing diuretic protocols set by physician.;Long term: Adoption of self-care skills and reduction of barriers for early signs and symptoms recognition and intervention leading to self-care maintenance.     Hypertension Yes    Intervention Provide education on lifestyle modifcations including regular physical activity/exercise, weight management, moderate sodium restriction and increased consumption of fresh fruit, vegetables, and low fat dairy, alcohol moderation, and smoking cessation.;Monitor prescription use compliance.    Expected Outcomes Short Term: Continued assessment and intervention until BP is < 140/66mm HG in hypertensive participants. < 130/37mm HG in hypertensive participants with diabetes, heart failure or chronic kidney disease.;Long Term: Maintenance of blood pressure at goal levels.    Lipids Yes    Intervention Provide education and support for participant on nutrition & aerobic/resistive exercise along with prescribed medications to achieve LDL 70mg , HDL >40mg .    Expected Outcomes Short Term: Participant states understanding of desired cholesterol values and is compliant with medications prescribed. Participant is following exercise prescription and nutrition guidelines.;Long Term: Cholesterol controlled with medications as prescribed, with individualized exercise RX and with personalized nutrition plan. Value goals: LDL < 70mg , HDL > 40 mg.          Tobacco Use Initial Evaluation: Social History   Tobacco Use  Smoking Status Some Days   Types: Cigars  Smokeless Tobacco Never  Tobacco Comments   Vaping as well    Exercise Goals and Review:  Exercise Goals     Row Name 01/18/25 1534             Exercise Goals   Increase Physical Activity Yes       Intervention Provide advice, education, support and counseling about physical activity/exercise needs.;Develop an individualized exercise prescription for aerobic and resistive training based on initial evaluation findings, risk stratification, comorbidities and participant's personal goals.       Expected Outcomes Short Term: Attend rehab on a regular basis to increase amount of physical activity.;Long Term: Add in home  exercise to make exercise part of routine and to increase amount of physical activity.;Long Term: Exercising regularly at least 3-5 days a week.       Increase Strength and Stamina Yes       Intervention Provide advice, education, support and counseling about physical activity/exercise needs.;Develop an individualized exercise prescription for aerobic and resistive training based on initial evaluation findings, risk stratification, comorbidities and participant's personal goals.       Expected Outcomes Short Term: Increase workloads from initial exercise prescription for resistance, speed, and METs.;Short Term: Perform resistance training exercises routinely during rehab and add in resistance training at home;Long Term: Improve cardiorespiratory fitness, muscular endurance and strength as measured by increased METs and functional capacity ( )       Able to understand and use rate of perceived exertion (RPE) scale Yes       Intervention Provide education and explanation on how to use RPE scale       Expected Outcomes Short Term: Able to use RPE daily in rehab to express subjective intensity level;Long Term:  Able to use RPE to guide intensity level when exercising independently       Able to understand and use Dyspnea scale Yes       Intervention Provide education and explanation on how to use Dyspnea scale  Expected Outcomes Long Term: Able to use Dyspnea scale to guide intensity level when exercising independently;Short Term: Able to use Dyspnea scale daily in rehab to express subjective sense of shortness of breath during exertion       Knowledge and understanding of Target Heart Rate Range (THRR) Yes       Intervention Provide education and explanation of THRR including how the numbers were predicted and where they are located for reference       Expected Outcomes Short Term: Able to state/look up THRR;Long Term: Able to use THRR to govern intensity when exercising independently;Short Term:  Able to use daily as guideline for intensity in rehab       Able to check pulse independently Yes       Intervention Provide education and demonstration on how to check pulse in carotid and radial arteries.;Review the importance of being able to check your own pulse for safety during independent exercise       Expected Outcomes Short Term: Able to explain why pulse checking is important during independent exercise;Long Term: Able to check pulse independently and accurately       Understanding of Exercise Prescription Yes       Intervention Provide education, explanation, and written materials on patient's individual exercise prescription       Expected Outcomes Short Term: Able to explain program exercise prescription;Long Term: Able to explain home exercise prescription to exercise independently          Copy of goals given to participant.

## 2025-01-19 ENCOUNTER — Other Ambulatory Visit: Payer: Self-pay

## 2025-01-19 DIAGNOSIS — M25551 Pain in right hip: Secondary | ICD-10-CM

## 2025-01-21 ENCOUNTER — Encounter (HOSPITAL_COMMUNITY)
Admission: RE | Admit: 2025-01-21 | Discharge: 2025-01-21 | Disposition: A | Discharge status: Home / Self Care | Source: Ambulatory Visit | Attending: Cardiology | Admitting: Cardiology

## 2025-01-21 DIAGNOSIS — Z955 Presence of coronary angioplasty implant and graft: Secondary | ICD-10-CM

## 2025-01-21 LAB — GLUCOSE, CAPILLARY
Glucose-Capillary: 261 mg/dL — ABNORMAL HIGH (ref 70–99)
Glucose-Capillary: 277 mg/dL — ABNORMAL HIGH (ref 70–99)

## 2025-01-21 NOTE — Progress Notes (Signed)
 Daily Session Note  Patient Details  Name: Steve Vang MRN: [id number] Date of Birth: Jun 26, 1966 Referring Provider:   Flowsheet Row CARDIAC REHAB PHASE II ORIENTATION from 01/18/2025 in [facility] CARDIAC [facility]ILITATION  Referring Provider Debera Savant MD    Encounter Date: 01/21/2025  Check In:  Session Check In - 01/21/25 1315       Check-In   Supervising physician immediately available to respond to emergencies See telemetry face sheet for immediately available MD    Location [facility]    Staff Present Rolland Sake BSN, RN;Jessamyn Watterson Vicci, RN, BSN;Heather Con, BS, Exercise Physiologist    Virtual Visit No    Medication changes reported     No    Fall or balance concerns reported    No    VAD Patient? No    PAD/SET Patient? No      Pain Assessment   Currently in Pain? No/denies    Pain Score 0-No pain    Multiple Pain Sites No          Capillary Blood Glucose: Results for orders placed or performed during the hospital encounter of 01/21/25 (from the past 24 hours)  Glucose, capillary     Status: Abnormal   Collection Time: 01/21/25  1:25 PM  Result Value Ref Range   Glucose-Capillary 261 (H) 70 - 99 mg/dL      Tobacco Use [id number][8]  Goals Met:  Independence with exercise equipment Exercise tolerated well No report of concerns or symptoms today Strength training completed today  Goals Unmet:  Not Applicable  Comments: Pt able to follow exercise prescription today without complaint.  Will continue to monitor for progression.        [1]  Social History Tobacco Use  Smoking Status Some Days   Types: Cigars  Smokeless Tobacco Never  Tobacco Comments   Vaping as well

## 2025-01-26 ENCOUNTER — Encounter (HOSPITAL_COMMUNITY)
Admission: RE | Admit: 2025-01-26 | Discharge: 2025-01-26 | Disposition: A | Source: Ambulatory Visit | Attending: Cardiology

## 2025-01-26 ENCOUNTER — Encounter (HOSPITAL_COMMUNITY): Payer: Self-pay

## 2025-01-26 DIAGNOSIS — Z955 Presence of coronary angioplasty implant and graft: Secondary | ICD-10-CM

## 2025-01-26 NOTE — Progress Notes (Signed)
 Cardiac Individual Treatment Plan  Patient Details  Name: Steve Vang MRN: [id number] Date of Birth: 01-Jan-1966 Referring Provider:   Flowsheet Row CARDIAC REHAB PHASE II ORIENTATION from 01/18/2025 in [facility]  Referring Provider Debera Savant MD    Initial Encounter Date:  Flowsheet Row CARDIAC REHAB PHASE II ORIENTATION from 01/18/2025 in [facility]  Date 01/18/25    Visit Diagnosis: Status post coronary artery stent placement  Patient's Home Medications on Admission: Current Medications[1]  Past Medical History: Past Medical History:  Diagnosis Date   CAD (coronary artery disease)    Multivessel, LVEF 45-50%   Cardiomyopathy (HCC)    LVEF 50-55% December 2016   CKD (chronic kidney disease) stage 2, GFR 60-89 ml/min    COPD (chronic obstructive pulmonary disease) (HCC)    Diastolic heart failure (HCC)    Essential hypertension    History of stroke 2004   Hyperlipidemia    Left ventricular mural thrombus    MDD in remission    Not   Myocardial infarction [facility])    Stroke (HCC)    T2DM (type 2 diabetes mellitus) (HCC) 06/24/2023   Diagnoised with Hgb a1c of 7.5   Tobacco use disorder     Tobacco Use: Tobacco Use History[2]  Labs: Review Flowsheet  More data exists      Latest Ref Rng & Units 06/25/2023 01/21/2024 01/22/2024 06/24/2024 01/13/2025  Labs for ITP Cardiac and Pulmonary Rehab  Cholestrol 100 - 199 mg/dL 838  - 855  898  842   LDL (calc) 0 - 99 mg/dL 93  - 85  46  70   HDL-C >39 mg/dL 38  - 42  38  54   Trlycerides 0 - 149 mg/dL 850  - 88  86  798   Hemoglobin A1c 4.8 - 5.6 % - 6.2  - 6.4  -     Exercise Target Goals: Exercise Program Goal: Individual exercise prescription set using results from initial 6 min walk test and THRR while considering  patients activity barriers and safety.   Exercise Prescription Goal: Initial exercise prescription builds to 30-45 minutes a day of aerobic activity, 2-3  days per week.  Home exercise guidelines will be given to patient during program as part of exercise prescription that the participant will acknowledge.   Education: Aerobic Exercise: - Group verbal and visual presentation on the components of exercise prescription. Introduces F.I.T.T principle from ACSM for exercise prescriptions.  Reviews F.I.T.T. principles of aerobic exercise including progression. Written material provided at class time.   Education: Resistance Exercise: - Group verbal and visual presentation on the components of exercise prescription. Introduces F.I.T.T principle from ACSM for exercise prescriptions  Reviews F.I.T.T. principles of resistance exercise including progression. Written material provided at class time.    Education: Exercise & Equipment Safety: - Individual verbal instruction and demonstration of equipment use and safety with use of the equipment.   Education: Exercise Physiology & General Exercise Guidelines: - Group verbal and written instruction with models to review the exercise physiology of the cardiovascular system and associated critical values. Provides general exercise guidelines with specific guidelines to those with heart or lung disease. Written material provided at class time.   Education: Flexibility, Balance, Mind/Body Relaxation: - Group verbal and visual presentation with interactive activity on the components of exercise prescription. Introduces F.I.T.T principle from ACSM for exercise prescriptions. Reviews F.I.T.T. principles of flexibility and balance exercise training including progression. Also discusses the mind body connection.  Reviews various relaxation techniques to help reduce and manage stress (i.e. Deep breathing, progressive muscle relaxation, and visualization). Balance handout provided to take home. Written material provided at class time.   Activity Barriers & Risk Stratification:  Activity Barriers & Cardiac Risk  Stratification - 01/14/25 1530       Activity Barriers & Cardiac Risk Stratification   Activity Barriers History of Falls;Left Hip Replacement;Right Hip Replacement;Shortness of Breath;Balance Concerns;Chest Pain/Angina   CVA 2004 with balance issues.   Cardiac Risk Stratification High          6 Minute Walk:  6 Minute Walk     Row Name 01/18/25 1527         6 Minute Walk   Phase Initial     Distance 1100 feet     Distance Feet Change 0 ft     Walk Time 6 minutes     # of Rest Breaks 0     MPH 2.08     METS 3.29     RPE 12     Perceived Dyspnea  0     VO2 Peak 11.5     Symptoms Yes (comment)     Comments R hip pain 2/10     Resting HR 83 bpm     Resting BP 116/70     Resting Oxygen Saturation  96 %     Exercise Oxygen Saturation  during 6 min walk 97 %     Max Ex. HR 101 bpm     Max Ex. BP 122/70     2 Minute Post BP 118/70        Oxygen Initial Assessment:   Oxygen Re-Evaluation:   Oxygen Discharge (Final Oxygen Re-Evaluation):   Initial Exercise Prescription:  Initial Exercise Prescription - 01/18/25 1500       Date of Initial Exercise RX and Referring Provider   Date 01/18/25    Referring Provider Debera Savant MD      NuStep   Level 1    SPM 50    Minutes 15    METs 1.8      REL-XR   Level 1    Speed 50    Minutes 15    METs 1.8      Prescription Details   Frequency (times per week) 2    Duration Progress to 30 minutes of continuous aerobic without signs/symptoms of physical distress      Intensity   THRR 40-80% of Max Heartrate 114-145    Ratings of Perceived Exertion 11-13      Resistance Training   Training Prescription Yes    Weight 4          Perform Capillary Blood Glucose checks as needed.  Exercise Prescription Changes:   Exercise Prescription Changes     Row Name 01/18/25 1500             Response to Exercise   Blood Pressure (Admit) 116/70       Blood Pressure (Exercise) 122/70       Blood Pressure  (Exit) 118/70       Heart Rate (Admit) 83 bpm       Heart Rate (Exercise) 101 bpm       Heart Rate (Exit) 85 bpm       Oxygen Saturation (Admit) 96 %       Oxygen Saturation (Exercise) 97 %       Oxygen Saturation (Exit) 97 %       Rating  of Perceived Exertion (Exercise) 12       Perceived Dyspnea (Exercise) 0          Exercise Comments:   Exercise Goals and Review:   Exercise Goals     Row Name 01/18/25 1534             Exercise Goals   Increase Physical Activity Yes       Intervention Provide advice, education, support and counseling about physical activity/exercise needs.;Develop an individualized exercise prescription for aerobic and resistive training based on initial evaluation findings, risk stratification, comorbidities and participant's personal goals.       Expected Outcomes Short Term: Attend rehab on a regular basis to increase amount of physical activity.;Long Term: Add in home exercise to make exercise part of routine and to increase amount of physical activity.;Long Term: Exercising regularly at least 3-5 days a week.       Increase Strength and Stamina Yes       Intervention Provide advice, education, support and counseling about physical activity/exercise needs.;Develop an individualized exercise prescription for aerobic and resistive training based on initial evaluation findings, risk stratification, comorbidities and participant's personal goals.       Expected Outcomes Short Term: Increase workloads from initial exercise prescription for resistance, speed, and METs.;Short Term: Perform resistance training exercises routinely during rehab and add in resistance training at home;Long Term: Improve cardiorespiratory fitness, muscular endurance and strength as measured by increased METs and functional capacity ( )       Able to understand and use rate of perceived exertion (RPE) scale Yes       Intervention Provide education and explanation on how to use RPE scale        Expected Outcomes Short Term: Able to use RPE daily in rehab to express subjective intensity level;Long Term:  Able to use RPE to guide intensity level when exercising independently       Able to understand and use Dyspnea scale Yes       Intervention Provide education and explanation on how to use Dyspnea scale       Expected Outcomes Long Term: Able to use Dyspnea scale to guide intensity level when exercising independently;Short Term: Able to use Dyspnea scale daily in rehab to express subjective sense of shortness of breath during exertion       Knowledge and understanding of Target Heart Rate Range (THRR) Yes       Intervention Provide education and explanation of THRR including how the numbers were predicted and where they are located for reference       Expected Outcomes Short Term: Able to state/look up THRR;Long Term: Able to use THRR to govern intensity when exercising independently;Short Term: Able to use daily as guideline for intensity in rehab       Able to check pulse independently Yes       Intervention Provide education and demonstration on how to check pulse in carotid and radial arteries.;Review the importance of being able to check your own pulse for safety during independent exercise       Expected Outcomes Short Term: Able to explain why pulse checking is important during independent exercise;Long Term: Able to check pulse independently and accurately       Understanding of Exercise Prescription Yes       Intervention Provide education, explanation, and written materials on patient's individual exercise prescription       Expected Outcomes Short Term: Able to explain program exercise prescription;Long Term: Able to  explain home exercise prescription to exercise independently          Exercise Goals Re-Evaluation :   Discharge Exercise Prescription (Final Exercise Prescription Changes):  Exercise Prescription Changes - 01/18/25 1500       Response to Exercise   Blood  Pressure (Admit) 116/70    Blood Pressure (Exercise) 122/70    Blood Pressure (Exit) 118/70    Heart Rate (Admit) 83 bpm    Heart Rate (Exercise) 101 bpm    Heart Rate (Exit) 85 bpm    Oxygen Saturation (Admit) 96 %    Oxygen Saturation (Exercise) 97 %    Oxygen Saturation (Exit) 97 %    Rating of Perceived Exertion (Exercise) 12    Perceived Dyspnea (Exercise) 0          Nutrition:  Target Goals: Understanding of nutrition guidelines, daily intake of sodium 1500mg , cholesterol 200mg , calories 30% from fat and 7% or less from saturated fats, daily to have 5 or more servings of fruits and vegetables.  Education: Nutrition 1 -Group instruction provided by verbal, written material, interactive activities, discussions, models, and posters to present general guidelines for heart healthy nutrition including macronutrients, label reading, and promoting whole foods over processed counterparts. Education serves as pensions consultant of discussion of heart healthy eating for all. Written material provided at class time.    Education: Nutrition 2 -Group instruction provided by verbal, written material, interactive activities, discussions, models, and posters to present general guidelines for heart healthy nutrition including sodium, cholesterol, and saturated fat. Providing guidance of habit forming to improve blood pressure, cholesterol, and body weight. Written material provided at class time.     Biometrics:  Pre Biometrics - 01/18/25 1535       Pre Biometrics   Height 5' 7 (1.702 m)    Weight 153 lb 14.1 oz (69.8 kg)    Waist Circumference 35 inches    Hip Circumference 37 inches    Waist to Hip Ratio 0.95 %    BMI (Calculated) 24.1    Grip Strength 14.2 kg           Nutrition Therapy Plan and Nutrition Goals:   Nutrition Assessments:  MEDIFICTS Score Key: >=70 Need to make dietary changes  40-70 Heart Healthy Diet <= 40 Therapeutic Level Cholesterol Diet  Flowsheet Row  CARDIAC VIRTUAL BASED CARE from 01/14/2025 in [facility]  Picture Your Plate Total Score on Admission 54   Picture Your Plate Scores: <59 Unhealthy dietary pattern with much room for improvement. 41-50 Dietary pattern unlikely to meet recommendations for good health and room for improvement. 51-60 More healthful dietary pattern, with some room for improvement.  >60 Healthy dietary pattern, although there may be some specific behaviors that could be improved.    Nutrition Goals Re-Evaluation:   Nutrition Goals Discharge (Final Nutrition Goals Re-Evaluation):   Psychosocial: Target Goals: Acknowledge presence or absence of significant depression and/or stress, maximize coping skills, provide positive support system. Participant is able to verbalize types and ability to use techniques and skills needed for reducing stress and depression.   Education: Stress, Anxiety, and Depression - Group verbal and visual presentation to define topics covered.  Reviews how body is impacted by stress, anxiety, and depression.  Also discusses healthy ways to reduce stress and to treat/manage anxiety and depression. Written material provided at class time. Flowsheet Row CARDIAC REHAB PHASE II EXERCISE from 01/21/2025 in [facility]  Date 01/21/25  Educator HB  Instruction Review  Code 1- [employer] Understanding    Education: Sleep Hygiene -Provides group verbal and written instruction about how sleep can affect your health.  Define sleep hygiene, discuss sleep cycles and impact of sleep habits. Review good sleep hygiene tips.   Initial Review & Psychosocial Screening:  Initial Psych Review & Screening - 01/14/25 1550       Initial Review   Current issues with History of Depression;Current Anxiety/Panic;Current Psychotropic Meds;Current Depression      Family Dynamics   Good Support System? Yes    Comments Patient's wife and 2 sons support him well.       Barriers   Psychosocial barriers to participate in program The patient should benefit from training in stress management and relaxation.;There are no identifiable barriers or psychosocial needs.      Screening Interventions   Interventions To provide support and resources with identified psychosocial needs;Encouraged to exercise;Provide feedback about the scores to participant    Expected Outcomes Short Term goal: Utilizing psychosocial counselor, staff and physician to assist with identification of specific Stressors or current issues interfering with healing process. Setting desired goal for each stressor or current issue identified.;Long Term Goal: Stressors or current issues are controlled or eliminated.;Short Term goal: Identification and review with participant of any Quality of Life or Depression concerns found by scoring the questionnaire.;Long Term goal: The participant improves quality of Life and PHQ9 Scores as seen by post scores and/or verbalization of changes          Quality of Life Scores:   Quality of Life - 01/14/25 1548       Quality of Life   Select Quality of Life      Quality of Life Scores   Health/Function Pre 14.4 %    Socioeconomic Pre 18.13 %    Psych/Spiritual Pre 21.21 %    Family Pre 24 %    GLOBAL Pre 17.99 %         Scores of 19 and below usually indicate a poorer quality of life in these areas.  A difference of  2-3 points is a clinically meaningful difference.  A difference of 2-3 points in the total score of the Quality of Life Index has been associated with significant improvement in overall quality of life, self-image, physical symptoms, and general health in studies assessing change in quality of life.  PHQ-9: Review Flowsheet  More data exists      01/18/2025 11/02/2024 06/24/2024 01/21/2024 10/01/2023  Depression screen PHQ 2/9  Decreased Interest 1 1 1 1 1   Down, Depressed, Hopeless 3 3 3 2 2   PHQ - 2 Score 4 4 4 3 3   Altered sleeping 1 3 3  0  0  Tired, decreased energy 2 0 0 1 1  Change in appetite 1 2 2 3 3   Feeling bad or failure about yourself  3 3 3 2 2   Trouble concentrating 1 0 0 2 2  Moving slowly or fidgety/restless 2 0 0 0 1  Suicidal thoughts 3 0 0 0 0  PHQ-9 Score 17 12  12  11  12    Difficult doing work/chores Somewhat difficult Very difficult Somewhat difficult Somewhat difficult Somewhat difficult    Details       Data saved with a previous flowsheet row definition        Interpretation of Total Score  Total Score Depression Severity:  1-4 = Minimal depression, 5-9 = Mild depression, 10-14 = Moderate depression, 15-19 = Moderately severe depression, 20-27 =  Severe depression   Psychosocial Evaluation and Intervention:  Psychosocial Evaluation - 01/14/25 1550       Psychosocial Evaluation & Interventions   Interventions Stress management education;Relaxation education;Encouraged to exercise with the program and follow exercise prescription    Comments Patient was referred to cardiac rehab with stent placement. He says he is currently being treated for depression with medication and is seeing his PCP for a referral to a counselor. He continues to have chest pain which he takes SL NTG usually relieved with 2 which was noted in [facility] office note. He says Dr. Debera is also aware of his chest pain. He says he sleeps well. He had a CVA in 2004 which caused balance issues. 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 His goals for the program are to be able to walk further with less chest pain and to be able to increase his activities. His chest pain may be a barrier for him to participate in the program. We will continue to monitor.    Expected Outcomes Short Term: Patient will start the program and attend consistently. Long Term: Patient will complete the program meeting personal goals.    Continue Psychosocial Services  Follow up required by staff          Psychosocial  Re-Evaluation:   Psychosocial Discharge (Final Psychosocial Re-Evaluation):   Vocational Rehabilitation: Provide vocational rehab assistance to qualifying candidates.   Vocational Rehab Evaluation & Intervention:  Vocational Rehab - 01/14/25 1548       Initial Vocational Rehab Evaluation & Intervention   Assessment shows need for Vocational Rehabilitation No      Vocational Rehab Re-Evaulation   Comments Disabled.          Education: Education Goals: Education classes will be provided on a variety of topics geared toward better understanding of heart health and risk factor modification. Participant will state understanding/return demonstration of topics presented as noted by education test scores.  Learning Barriers/Preferences:  Learning Barriers/Preferences - 01/14/25 1548       Learning Barriers/Preferences   Learning Barriers --   Completed 8th grade.   Learning Preferences Skilled Demonstration          General Cardiac Education Topics:  AED/CPR: - Group verbal and written instruction with the use of models to demonstrate the basic use of the AED with the basic ABC's of resuscitation.   Test and Procedures: - Group verbal and visual presentation and models provide information about basic cardiac anatomy and function. Reviews the testing methods done to diagnose heart disease and the outcomes of the test results. Describes the treatment choices: Medical Management, Angioplasty, or Coronary Bypass Surgery for treating various heart conditions including Myocardial Infarction, Angina, Valve Disease, and Cardiac Arrhythmias. Written material provided at class time.   Medication Safety: - Group verbal and visual instruction to review commonly prescribed medications for heart and lung disease. Reviews the medication, class of the drug, and side effects. Includes the steps to properly store meds and maintain the prescription regimen. Written material provided at class  time.   Intimacy: - Group verbal instruction through game format to discuss how heart and lung disease can affect sexual intimacy. Written material provided at class time.   Know Your Numbers and Heart Failure: - Group verbal and visual instruction to discuss disease risk factors for cardiac and pulmonary disease and treatment options.  Reviews associated critical values for Overweight/Obesity, Hypertension, Cholesterol, and Diabetes.  Discusses basics of heart failure:  signs/symptoms and treatments.  Introduces Heart Failure Zone chart for action plan for heart failure. Written material provided at class time.   Infection Prevention: - Provides verbal and written material to individual with discussion of infection control including proper hand washing and proper equipment cleaning during exercise session.   Falls Prevention: - Provides verbal and written material to individual with discussion of falls prevention and safety.   Other: -Provides group and verbal instruction on various topics (see comments)   Knowledge Questionnaire Score:  Knowledge Questionnaire Score - 01/14/25 1548       Knowledge Questionnaire Score   Pre Score 21/26          Core Components/Risk Factors/Patient Goals at Admission:  Personal Goals and Risk Factors at Admission - 01/14/25 1547       Core Components/Risk Factors/Patient Goals on Admission    Weight Management Weight Maintenance    Improve shortness of breath with ADL's Yes    Intervention Provide education, individualized exercise plan and daily activity instruction to help decrease symptoms of SOB with activities of daily living.    Expected Outcomes Short Term: Improve cardiorespiratory fitness to achieve a reduction of symptoms when performing ADLs;Long Term: Be able to perform more ADLs without symptoms or delay the onset of symptoms    Diabetes Yes    Intervention Provide education about signs/symptoms and action to take for  hypo/hyperglycemia.;Provide education about proper nutrition, including hydration, and aerobic/resistive exercise prescription along with prescribed medications to achieve blood glucose in normal ranges: Fasting glucose 65-99 mg/dL    Expected Outcomes Short Term: Participant verbalizes understanding of the signs/symptoms and immediate care of hyper/hypoglycemia, proper foot care and importance of medication, aerobic/resistive exercise and nutrition plan for blood glucose control.;Long Term: Attainment of HbA1C < 7%.    Heart Failure Yes    Intervention Provide a combined exercise and nutrition program that is supplemented with education, support and counseling about heart failure. Directed toward relieving symptoms such as shortness of breath, decreased exercise tolerance, and extremity edema.    Expected Outcomes Improve functional capacity of life;Short term: Attendance in program 2-3 days a week with increased exercise capacity. Reported lower sodium intake. Reported increased fruit and vegetable intake. Reports medication compliance.;Short term: Daily weights obtained and reported for increase. Utilizing diuretic protocols set by physician.;Long term: Adoption of self-care skills and reduction of barriers for early signs and symptoms recognition and intervention leading to self-care maintenance.    Hypertension Yes    Intervention Provide education on lifestyle modifcations including regular physical activity/exercise, weight management, moderate sodium restriction and increased consumption of fresh fruit, vegetables, and low fat dairy, alcohol moderation, and smoking cessation.;Monitor prescription use compliance.    Expected Outcomes Short Term: Continued assessment and intervention until BP is < 140/73mm HG in hypertensive participants. < 130/32mm HG in hypertensive participants with diabetes, heart failure or chronic kidney disease.;Long Term: Maintenance of blood pressure at goal levels.    Lipids  Yes    Intervention Provide education and support for participant on nutrition & aerobic/resistive exercise along with prescribed medications to achieve LDL 70mg , HDL >40mg .    Expected Outcomes Short Term: Participant states understanding of desired cholesterol values and is compliant with medications prescribed. Participant is following exercise prescription and nutrition guidelines.;Long Term: Cholesterol controlled with medications as prescribed, with individualized exercise RX and with personalized nutrition plan. Value goals: LDL < 70mg , HDL > 40 mg.          Education:Diabetes - Individual verbal and  written instruction to review signs/symptoms of diabetes, desired ranges of glucose level fasting, after meals and with exercise. Acknowledge that pre and post exercise glucose checks will be done for 3 sessions at entry of program.   Core Components/Risk Factors/Patient Goals Review:    Core Components/Risk Factors/Patient Goals at Discharge (Final Review):    ITP Comments:  ITP Comments     Row Name 01/14/25 1558 01/18/25 1541 01/21/25 1609 01/26/25 1036     ITP Comments Virtual orientation visit completed for cardiac rehab with Stent placement. On-site orientation visit scheduled for 01/18/25 at 2:30. Patient arrived for 1st visit/orientation/education at 1415. Patient was referred to CR by Dr. CANDIE Sierras due to Status Post Coronary Artery Stent Placement. During orientation advised patient on arrival and appointment times what to wear, what to do before, during and after exercise. Reviewed attendance and class policy.  Pt is scheduled to return Cardiac Rehab on 01/21/25 at 1330. Pt was advised to come to class 15 minutes before class starts.  Discussed RPE/Dpysnea scales. Patient participated in warm up stretches. Patient was able to complete 6 minute walk test.  Telemetry:NSR. Patient was measured for the equipment. Discussed equipment safety with patient. Took patient  pre-anthropometric measurements. Patient finished visit at 1530. I am off today, happened to see this checking charts from home. I reviewed his chart as well as Ms. Miriam NP's recent note. 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 It looks like his medicines are fairly well optimized, not a lot of room to adjust based on his low normal blood pressures. He can attempt cardiac rehabilitation although I would keep him fairly low level and if his symptoms do not improve, he should probably stop the program. From Dr. Sierras 30 day review completed. ITP sent to Dr. Dorn Ross, Medical Director of Cardiac Rehab. Continue with ITP unless changes are made by physician.  New to the program.       Comments: 30 day review     [1]  Current Outpatient Medications:    aspirin  EC 81 MG tablet, Take 81 mg by mouth daily., Disp: , Rfl:    blood glucose meter kit and supplies, Dispense based on patient and insurance preference. Use three times daily DX E11.65, Disp: 1 each, Rfl: 0   Blood Pressure Monitoring (MICROLIFE BPM2 BP MONITOR) KIT, 1 each by Does not apply route as directed., Disp: 1 kit, Rfl: 0   carvedilol  (COREG ) 6.25 MG tablet, Take 1 tablet (6.25 mg total) by mouth 2 (two) times daily., Disp: 180 tablet, Rfl: 3   clopidogrel (PLAVIX) 75 MG tablet, Take 75 mg by mouth daily., Disp: , Rfl:    cyclobenzaprine  (FLEXERIL ) 10 MG tablet, Take 1 tablet by mouth three times daily as needed for muscle spasm, Disp: 90 tablet, Rfl: 0   empagliflozin  (JARDIANCE ) 10 MG TABS tablet, Take 1 tablet (10 mg total) by mouth daily., Disp: 90 tablet, Rfl: 3   escitalopram  (LEXAPRO ) 10 MG tablet, Take 1 tablet (10 mg total) by mouth daily., Disp: 30 tablet, Rfl: 3   ezetimibe  (ZETIA ) 10 MG tablet, Take 1 tablet (10 mg total) by  mouth daily., Disp: 90 tablet, Rfl: 2   furosemide  (LASIX ) 40 MG tablet, Take 1 tablet (40 mg total) by mouth daily., Disp: 90 tablet, Rfl: 3  isosorbide  mononitrate (IMDUR ) 60 MG 24 hr tablet, Take 1 tablet (60 mg total) by mouth 2 (two) times daily., Disp: 180 tablet, Rfl: 2   losartan  (COZAAR ) 25 MG tablet, Take 0.5 tablets (12.5 mg total) by mouth 2 (two) times daily., Disp: 45 tablet, Rfl: 2   nitroGLYCERIN  (NITROSTAT ) 0.4 MG SL tablet, Place 1 tablet (0.4 mg total) under the tongue every 5 (five) minutes x 3 doses as needed for chest pain (If no relief after 3rd dose, proceed to ED or call 911)., Disp: 25 tablet, Rfl: 11   ranolazine  (RANEXA ) 1000 MG SR tablet, Take 1 tablet (1,000 mg total) by mouth 2 (two) times daily., Disp: 180 tablet, Rfl: 2   rosuvastatin  (CRESTOR ) 40 MG tablet, Take 1 tablet (40 mg total) by mouth daily., Disp: 90 tablet, Rfl: 2   spironolactone  (ALDACTONE ) 25 MG tablet, Take 1 tablet (25 mg total) by mouth daily., Disp: 90 tablet, Rfl: 3 [2]  Social History Tobacco Use  Smoking Status Some Days   Types: Cigars  Smokeless Tobacco Never  Tobacco Comments   Vaping as well

## 2025-01-26 NOTE — Progress Notes (Signed)
 Daily Session Note  Patient Details  Name: Steve Vang MRN: [id number] Date of Birth: Oct 02, 1966 Referring Provider:   Flowsheet Row CARDIAC REHAB PHASE II ORIENTATION from 01/18/2025 in [facility] CARDIAC REHABILITATION  Referring Provider Debera Savant MD    Encounter Date: 01/26/2025  Check In:  Session Check In - 01/26/25 1325       Check-In   Supervising physician immediately available to respond to emergencies See telemetry face sheet for immediately available MD    Location [facility]    Staff Present Powell Benders, BS, Exercise Physiologist;Brittany Jackquline, BSN, RN, WTA-C    Virtual Visit No    Medication changes reported     No    Fall or balance concerns reported    No    Tobacco Cessation No Change    Warm-up and Cool-down Performed on first and last piece of equipment    Resistance Training Performed Yes    VAD Patient? No    PAD/SET Patient? No      Pain Assessment   Currently in Pain? No/denies    Pain Score 0-No pain    Multiple Pain Sites No          Capillary Blood Glucose: No results found for this or any previous visit (from the past 24 hours).    Tobacco Use History[1]  Goals Met:  Independence with exercise equipment Exercise tolerated well No report of concerns or symptoms today Strength training completed today  Goals Unmet:  Not Applicable  Comments: Pt able to follow exercise prescription today without complaint.  Will continue to monitor for progression.        [1]  Social History Tobacco Use  Smoking Status Some Days   Types: Cigars  Smokeless Tobacco Never  Tobacco Comments   Vaping as well

## 2025-01-28 ENCOUNTER — Encounter (HOSPITAL_COMMUNITY)

## 2025-02-01 ENCOUNTER — Ambulatory Visit: Payer: Self-pay | Admitting: Nurse Practitioner

## 2025-02-01 DIAGNOSIS — E782 Mixed hyperlipidemia: Secondary | ICD-10-CM

## 2025-02-02 ENCOUNTER — Encounter (HOSPITAL_COMMUNITY)

## 2025-02-04 ENCOUNTER — Encounter (HOSPITAL_COMMUNITY)

## 2025-02-04 ENCOUNTER — Telehealth (HOSPITAL_COMMUNITY): Payer: Self-pay | Admitting: Surgery

## 2025-02-04 NOTE — Telephone Encounter (Signed)
 The patient has been notified of the result and verbalized understanding.  All questions (if any) were answered. Spoke with wife and is agreeable for the referral to Pharm D and is aware that she will receive a call to set up appointment. Littie CHRISTELLA Croak, CMA 02/04/2025 4:35 PM

## 2025-02-04 NOTE — Telephone Encounter (Signed)
-----   Message from Almarie Crate, NP sent at 02/01/2025  9:25 AM EST ----- Labs overall look good, however his bad cholesterol is not at goal and triglycerides are elevated.  I recommend a referral to Pharm.D. in [place] to discuss PCSK9 inhibitors to get his cholesterol at  goal. Heart healthy diet and regular cardiovascular exercise are encouraged.   Almarie Crate, AGNP-C

## 2025-02-04 NOTE — Telephone Encounter (Signed)
 I called the pt since he missed his cardiac rehab session today.  [place] to [place].  He plans to be back next Tuesday for rehab.

## 2025-02-08 ENCOUNTER — Ambulatory Visit

## 2025-02-09 ENCOUNTER — Encounter (HOSPITAL_COMMUNITY)

## 2025-02-11 ENCOUNTER — Encounter (HOSPITAL_COMMUNITY)

## 2025-02-16 ENCOUNTER — Encounter (HOSPITAL_COMMUNITY)

## 2025-02-17 ENCOUNTER — Ambulatory Visit

## 2025-02-18 ENCOUNTER — Encounter (HOSPITAL_COMMUNITY)

## 2025-02-18 ENCOUNTER — Ambulatory Visit

## 2025-02-23 ENCOUNTER — Encounter (HOSPITAL_COMMUNITY)

## 2025-02-25 ENCOUNTER — Encounter (HOSPITAL_COMMUNITY)

## 2025-03-02 ENCOUNTER — Encounter (HOSPITAL_COMMUNITY)

## 2025-03-04 ENCOUNTER — Encounter (HOSPITAL_COMMUNITY)

## 2025-03-09 ENCOUNTER — Encounter (HOSPITAL_COMMUNITY)

## 2025-03-11 ENCOUNTER — Encounter (HOSPITAL_COMMUNITY)

## 2025-03-16 ENCOUNTER — Encounter (HOSPITAL_COMMUNITY)

## 2025-03-18 ENCOUNTER — Encounter (HOSPITAL_COMMUNITY)

## 2025-03-23 ENCOUNTER — Encounter (HOSPITAL_COMMUNITY)

## 2025-03-25 ENCOUNTER — Encounter (HOSPITAL_COMMUNITY)

## 2025-03-29 ENCOUNTER — Ambulatory Visit: Admitting: Nurse Practitioner

## 2025-03-30 ENCOUNTER — Encounter (HOSPITAL_COMMUNITY)

## 2025-04-01 ENCOUNTER — Encounter (HOSPITAL_COMMUNITY)

## 2025-04-06 ENCOUNTER — Encounter (HOSPITAL_COMMUNITY)

## 2025-04-08 ENCOUNTER — Encounter (HOSPITAL_COMMUNITY)

## 2025-04-13 ENCOUNTER — Encounter (HOSPITAL_COMMUNITY)

## 2025-04-15 ENCOUNTER — Encounter (HOSPITAL_COMMUNITY)

## 2025-04-20 ENCOUNTER — Encounter (HOSPITAL_COMMUNITY)

## 2025-04-22 ENCOUNTER — Encounter (HOSPITAL_COMMUNITY)

## 2025-04-27 ENCOUNTER — Encounter (HOSPITAL_COMMUNITY)

## 2025-04-29 ENCOUNTER — Encounter (HOSPITAL_COMMUNITY)

## 2025-05-04 ENCOUNTER — Encounter (HOSPITAL_COMMUNITY)

## 2025-05-06 ENCOUNTER — Encounter (HOSPITAL_COMMUNITY)

## 2025-05-11 ENCOUNTER — Encounter (HOSPITAL_COMMUNITY)

## 2025-05-13 ENCOUNTER — Encounter (HOSPITAL_COMMUNITY)

## 2025-05-18 ENCOUNTER — Encounter (HOSPITAL_COMMUNITY)

## 2025-05-20 ENCOUNTER — Encounter (HOSPITAL_COMMUNITY)
